# Patient Record
Sex: Female | Born: 1950
Health system: Southern US, Community
[De-identification: ages and names within clinical notes are randomized; demographics above are authoritative.]

## PROBLEM LIST (undated history)

## (undated) DIAGNOSIS — E669 Obesity, unspecified: Secondary | ICD-10-CM

## (undated) DIAGNOSIS — E785 Hyperlipidemia, unspecified: Secondary | ICD-10-CM

## (undated) DIAGNOSIS — F439 Reaction to severe stress, unspecified: Secondary | ICD-10-CM

## (undated) DIAGNOSIS — E119 Type 2 diabetes mellitus without complications: Secondary | ICD-10-CM

## (undated) DIAGNOSIS — D369 Benign neoplasm, unspecified site: Secondary | ICD-10-CM

## (undated) DIAGNOSIS — I639 Cerebral infarction, unspecified: Secondary | ICD-10-CM

## (undated) DIAGNOSIS — M1990 Unspecified osteoarthritis, unspecified site: Secondary | ICD-10-CM

## (undated) DIAGNOSIS — F32A Depression, unspecified: Secondary | ICD-10-CM

## (undated) DIAGNOSIS — F329 Major depressive disorder, single episode, unspecified: Secondary | ICD-10-CM

## (undated) DIAGNOSIS — L680 Hirsutism: Secondary | ICD-10-CM

## (undated) DIAGNOSIS — I10 Essential (primary) hypertension: Secondary | ICD-10-CM

## (undated) DIAGNOSIS — N189 Chronic kidney disease, unspecified: Secondary | ICD-10-CM

## (undated) DIAGNOSIS — R011 Cardiac murmur, unspecified: Secondary | ICD-10-CM

## (undated) HISTORY — DX: Cardiac murmur, unspecified: R01.1

## (undated) HISTORY — DX: Hirsutism: L68.0

## (undated) HISTORY — DX: Unspecified osteoarthritis, unspecified site: M19.90

## (undated) HISTORY — DX: Obesity, unspecified: E66.9

## (undated) HISTORY — DX: Major depressive disorder, single episode, unspecified: F32.9

## (undated) HISTORY — DX: Chronic kidney disease, unspecified: N18.9

## (undated) HISTORY — DX: Benign neoplasm, unspecified site: D36.9

## (undated) HISTORY — DX: Hyperlipidemia, unspecified: E78.5

## (undated) HISTORY — DX: Reaction to severe stress, unspecified: F43.9

## (undated) HISTORY — DX: Type 2 diabetes mellitus without complications: E11.9

## (undated) HISTORY — PX: DILATION AND CURETTAGE OF UTERUS: SHX78

## (undated) HISTORY — DX: Depression, unspecified: F32.A

## (undated) HISTORY — DX: Cerebral infarction, unspecified: I63.9

## (undated) HISTORY — DX: Essential (primary) hypertension: I10

## (undated) HISTORY — PX: COLONOSCOPY: SHX174

---
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
## 1999-09-20 ENCOUNTER — Encounter: Payer: Self-pay | Admitting: Internal Medicine

## 1999-09-20 ENCOUNTER — Ambulatory Visit (HOSPITAL_COMMUNITY): Admission: RE | Admit: 1999-09-20 | Discharge: 1999-09-20 | Payer: Self-pay | Admitting: Internal Medicine

## 1999-09-26 ENCOUNTER — Other Ambulatory Visit: Admission: RE | Admit: 1999-09-26 | Discharge: 1999-09-26 | Payer: Self-pay | Admitting: Internal Medicine

## 1999-10-03 ENCOUNTER — Encounter (INDEPENDENT_AMBULATORY_CARE_PROVIDER_SITE_OTHER): Payer: Self-pay

## 1999-10-03 ENCOUNTER — Ambulatory Visit (HOSPITAL_COMMUNITY): Admission: RE | Admit: 1999-10-03 | Discharge: 1999-10-03 | Payer: Self-pay | Admitting: Obstetrics & Gynecology

## 2000-03-21 ENCOUNTER — Other Ambulatory Visit: Admission: RE | Admit: 2000-03-21 | Discharge: 2000-03-21 | Payer: Self-pay | Admitting: Obstetrics & Gynecology

## 2000-10-31 ENCOUNTER — Ambulatory Visit (HOSPITAL_COMMUNITY): Admission: RE | Admit: 2000-10-31 | Discharge: 2000-10-31 | Payer: Self-pay | Admitting: Obstetrics & Gynecology

## 2000-10-31 ENCOUNTER — Encounter (INDEPENDENT_AMBULATORY_CARE_PROVIDER_SITE_OTHER): Payer: Self-pay

## 2001-04-27 ENCOUNTER — Other Ambulatory Visit: Admission: RE | Admit: 2001-04-27 | Discharge: 2001-04-27 | Payer: Self-pay | Admitting: Obstetrics & Gynecology

## 2002-10-19 ENCOUNTER — Other Ambulatory Visit: Admission: RE | Admit: 2002-10-19 | Discharge: 2002-10-19 | Payer: Self-pay | Admitting: Obstetrics & Gynecology

## 2003-10-13 ENCOUNTER — Other Ambulatory Visit: Admission: RE | Admit: 2003-10-13 | Discharge: 2003-10-13 | Payer: Self-pay | Admitting: Family Medicine

## 2005-12-02 ENCOUNTER — Emergency Department (HOSPITAL_COMMUNITY): Admission: EM | Admit: 2005-12-02 | Discharge: 2005-12-02 | Payer: Self-pay | Admitting: Family Medicine

## 2006-06-06 ENCOUNTER — Other Ambulatory Visit: Admission: RE | Admit: 2006-06-06 | Discharge: 2006-06-06 | Payer: Self-pay | Admitting: Family Medicine

## 2006-07-11 ENCOUNTER — Encounter (INDEPENDENT_AMBULATORY_CARE_PROVIDER_SITE_OTHER): Payer: Self-pay | Admitting: Family Medicine

## 2006-07-11 ENCOUNTER — Ambulatory Visit: Payer: Self-pay | Admitting: Vascular Surgery

## 2006-07-11 ENCOUNTER — Ambulatory Visit (HOSPITAL_COMMUNITY): Admission: RE | Admit: 2006-07-11 | Discharge: 2006-07-11 | Payer: Self-pay | Admitting: Family Medicine

## 2006-10-10 ENCOUNTER — Ambulatory Visit (HOSPITAL_COMMUNITY): Admission: RE | Admit: 2006-10-10 | Discharge: 2006-10-10 | Payer: Self-pay | Admitting: Obstetrics & Gynecology

## 2006-10-10 ENCOUNTER — Encounter (INDEPENDENT_AMBULATORY_CARE_PROVIDER_SITE_OTHER): Payer: Self-pay | Admitting: Obstetrics & Gynecology

## 2009-03-02 ENCOUNTER — Encounter: Admission: RE | Admit: 2009-03-02 | Discharge: 2009-03-02 | Payer: Self-pay | Admitting: Family Medicine

## 2010-03-15 ENCOUNTER — Emergency Department (HOSPITAL_COMMUNITY)
Admission: EM | Admit: 2010-03-15 | Discharge: 2010-03-15 | Payer: Self-pay | Source: Home / Self Care | Admitting: Emergency Medicine

## 2010-07-03 NOTE — Op Note (Signed)
Shannon Carroll, Shannon Carroll                ACCOUNT NO.:  [id number]   MEDICAL RECORD NO.:  [id number]          PATIENT TYPE:  AMB   LOCATION:  SDC                           FACILITY:  [facility]   PHYSICIAN:  Freddy Finner, M.D.   DATE OF BIRTH:  January 10, 1951   DATE OF PROCEDURE:  10/10/2006  DATE OF DISCHARGE:                               OPERATIVE REPORT   PREOPERATIVE DIAGNOSES:  1. Benign endocervical microglandular hyperplasia.  2. Postmenopausal bleeding.  3. Histologic diagnosis of endometrial biopsy.  4. Massive obesity.  5. Risk of endometrial hyperplasia is yet unconfirmed.   POSTOPERATIVE DIAGNOSES:  1. Benign endocervical microglandular hyperplasia.  2. Postmenopausal bleeding.  3. Histologic diagnosis of endometrial biopsy.  4. Massive obesity.  5. Risk of endometrial hyperplasia is yet unconfirmed.   OPERATIVE PROCEDURE:  Hysteroscopy D&C.   ANESTHESIA:  General and paracervical block using 10 mL of 1% Xylocaine.   INTRAOPERATIVE COMPLICATIONS:  None.   ESTIMATED BLOOD LOSS:  10 mL.   SORBITOL DEFICIT:  30 mL.   The patient is a pleasant, massively obese, white female who had an  episode of postmenopausal bleeding and was seen in the office by my  nurse practitioner, Julio Sicks, with a finding of a thickened  myometrium and 10 mm endometrial lining. An endometrial biopsy was  obtained with findings as noted above. Due to her increased risk of  hyperplasia and/or malignancy she is admitted now for more definitive  surgical intervention.   She was admitted on the morning of surgery, she was given a gram of  Ancef IV preoperatively. She was brought to the operating room, placed  under general anesthesia, placed in the dorsal lithotomy position using  the [facility] stirrup system. Betadine prep of the mons, perineum and  vagina was carried out, sterile drapes were applied. A bivalve speculum  was introduced and cervix visualized using the speculum. The cervix was  grasped on  the anterior lip with a single tooth tenaculum, paracervical  block was placed at 4 and 8 o'clock in the vaginal fornices at a depth  of approximately 5 mm with approximately 5 mL of 1% Xylocaine at each  location. The uterus sounded to 10 cm, the cervix was progressively  dilated to 25 with Pratt's. The 12-degree hysteroscope was introduced  using 3% sorbitol. Photographs were made before and after curettage,  there were no obvious defects, specifically no evidence of intracavitary  mass. Adequate sampling was confirmed by repeated inspection after  gentle thorough curettage and  exploration with Randall stone forceps. The patient was awakened and  taken to recovery in good condition. She will be given routine  outpatient surgical instructions. She was given Darvocet to be taken as  needed for postoperative pain. She is to followup in the office in 7-10  days.      Freddy Finner, M.D.  Electronically Signed     WRN/MEDQ  D:  10/10/2006  T:  10/11/2006  Job:  [id number]

## 2010-07-06 NOTE — Op Note (Signed)
[facility] of [facility]  Patient:    Shannon Carroll, Shannon Carroll                       MRN: [id number] Proc. Date: 10/03/99 Adm. Date:  [id number] Attending:  Minette Headland                           Operative Report  PREOPERATIVE DIAGNOSES:       1. Uterine enlargement.                               2. Dysfunctional uterine bleeding.                               3. Massive obesity.  POSTOPERATIVE DIAGNOSES:      1. Uterine enlargement.                               2. Dysfunctional uterine bleeding.                               3. Massive obesity.  OPERATION:                    Hysteroscopy and D&C.  SURGEON:                      Freddy Finner, M.D.  ANESTHESIA:                   Mask, intravenous sedation, and paracervical                               block.  INTRAOPERATIVE COMPLICATIONS: None.  ESTIMATED BLOOD LOSS:         Less than or equal to 20 cc.  INTRAOPERATIVE SORBITOL DEFICIT:                      80 cc.  INDICATIONS:                  The patient is a 60 year old massively obese white female who has had dysfunctional uterine bleeding.  A pelvic ultrasound was obtained in the office which showed enlargement of the uterus to 10.3 x 6.8 x 7.5 cm.  The study was suboptimal because of obesity.  There was one, approximately 2.5 cm lesion in the anterior uterus which was thought to be consistent with a fibroid or adenomyoma, and this further precluded adequate evaluation of the endometrium.                                Given the significant increased risk of endometrial atypia or malignancy, it is elected to proceed with hysteroscopy and D&C.  DESCRIPTION OF PROCEDURE:     On sounding, the uterus sounded only to 9 cm. The endometrial cavity did not appear to have any significant visible abnormalities, although the exam was somewhat compromised by the patients size, and by some physical movement of the patient during the operative procedure.  The patient was admitted on the morning of surgery and brought to the operating room, placed under adequate intravenous sedation, and placed in the dorsolithotomy position using the [facility] stirrup system.  Betadine prep was carried out in the usual fashion.  The cervix was visualized and grasped with a single tooth tenaculum.  A paracervical block was placed by injecting 5 cc of 1% plain Xylocaine at each of the two locations in the vaginal fornices at 4 oclock and 8 oclock.  The cervix was progressively dilated to 23.  The uterus sounded to 9 cm to 9.5 cm.  ACMI hysteroscope was introduced using sorbitol distending medium, visualization was somewhat compromised because of the patients movements during the procedure, but was not felt that there was any obvious abnormality noted in the endometrium or endocervix.  Thorough curettage and exploration was made of __________ was performed.  The procedure was then terminated.                                The instruments were removed and specimens were submitted for histologic examination.  The patient was taken to the recovery room in good condition and will be discharged in the immediate postoperative period for follow up in the office in approximately 1-2 weeks.  She is to take ibuprofen or Tylenol as needed for pain. DD:  10/03/99 TD:  10/03/99 Job: [id number]

## 2010-07-06 NOTE — Op Note (Signed)
[facility] of [facility]  Patient:    Shannon Carroll, Shannon Carroll Visit Number: [id number] MRN: [id number]          Service Type: DSU Location: [facility] Attending Physician:  Minette Headland Dictated by:   Freddy Finner, M.D. Admit Date:  10/31/2000                             Operative Report  PREOPERATIVE DIAGNOSES:       Postmenopausal bleeding, minimal endometrial thickening to 6.9 mm, uterine leiomyomata, massive obesity.  POSTOPERATIVE DIAGNOSES:      Postmenopausal bleeding, minimal endometrial thickening to 6.9 mm, uterine leiomyomata, massive obesity.  PROCEDURE:                    Dilatation and curettage with attempted hysteroscopy which was unsuccessful because of the patients body habitus and light anesthesia precluding adequate hysteroscopic examination of the uterine cavity.  SURGEON:                      Freddy Finner, M.D.  ANESTHESIA:                   Managed intravenous sedation and paracervical block.  HISTORY:                      Patient is admitted for hysteroscopy, D&C because of dysfunctional uterine bleeding.  This has been a recurrent problem. The patient has had some irregular bleeding in spite of Prometrium 200 mg q.h.s. at bedtime.  Pelvic ultrasound in the office did show 6.9 mm of endometrium without definite intracavitary abnormalities.  PROCEDURE:                    Patient was admitted, brought to the operating room, placed under adequate intravenous sedation, placed in the dorsal lithotomy position.  Betadine prep of perineum and vagina was carried out. Sterile drapes were applied.  Vivelle speculum was introduced.  Due to guarding and muscle tension the speculum would not remain in the vagina and for that reason the banana elongated posterior vaginal retractor was placed. Deaver was placed.  Cervix was grasped with a single tooth tenaculum on the anterior lip and paracervical block by injection 1% plain Xylocaine at 4 and  8 oclock at the vaginal fornices.  The cervix was dilated to 23 with Pratts. An attempt to insert the hysteroscope was unsuccessful because of the patient moving on the table and tightening the abdominal and pelvic musculature.  For this reason sharp curette was introduced and gentle, thorough curettage was carried out.  Exploration with ______ forceps was carried.  Sample submitted for histologic examination.  The procedure was terminated at this point.  The patient was taken to recovery in good condition and will be followed in the office in approximately one week.Dictated by:   Freddy Finner, M.D.  Attending Physician:  Minette Headland DD:  10/31/00 TD:  10/31/00 Job: [id number]

## 2010-11-09 ENCOUNTER — Other Ambulatory Visit: Payer: Self-pay | Admitting: Obstetrics & Gynecology

## 2010-11-30 LAB — CBC
HCT: 40.7
Hemoglobin: 14.4
MCHC: 35.4
MCV: 88.9
Platelets: 211
RBC: 4.57
RDW: 12.4
WBC: 9.5

## 2010-11-30 LAB — BASIC METABOLIC PANEL
BUN: 16
Creatinine, Ser: 0.72
GFR calc Af Amer: >60
GFR calc non Af Amer: >60

## 2012-12-28 ENCOUNTER — Other Ambulatory Visit: Payer: Self-pay | Admitting: Obstetrics & Gynecology

## 2012-12-28 DIAGNOSIS — R928 Other abnormal and inconclusive findings on diagnostic imaging of breast: Secondary | ICD-10-CM

## 2013-01-11 ENCOUNTER — Other Ambulatory Visit: Payer: Self-pay | Admitting: Gastroenterology

## 2013-01-18 DIAGNOSIS — D369 Benign neoplasm, unspecified site: Secondary | ICD-10-CM

## 2013-01-18 HISTORY — DX: Benign neoplasm, unspecified site: D36.9

## 2013-07-13 ENCOUNTER — Encounter: Payer: 59 | Attending: Family Medicine | Admitting: *Deleted

## 2013-07-13 ENCOUNTER — Encounter: Payer: Self-pay | Admitting: *Deleted

## 2013-07-13 VITALS — Ht 66.5 in | Wt 342.0 lb

## 2013-07-13 DIAGNOSIS — E669 Obesity, unspecified: Secondary | ICD-10-CM | POA: Insufficient documentation

## 2013-07-13 DIAGNOSIS — Z6841 Body Mass Index (BMI) 40.0 and over, adult: Secondary | ICD-10-CM | POA: Insufficient documentation

## 2013-07-13 DIAGNOSIS — E119 Type 2 diabetes mellitus without complications: Secondary | ICD-10-CM | POA: Insufficient documentation

## 2013-07-13 DIAGNOSIS — Z713 Dietary counseling and surveillance: Secondary | ICD-10-CM | POA: Insufficient documentation

## 2013-07-13 NOTE — Patient Instructions (Signed)
Plan:  Aim for 3 Carb Choices per meal (45 grams) +/- 1 either way  Aim for 0-1 Carbs per snack if hungry  Include protein in moderation with your meals and snacks Consider reading food labels for Total Carbohydrate of foods Consider  increasing your activity level by swimming or Arm Chair Exercises daily as tolerated

## 2013-07-13 NOTE — Progress Notes (Signed)
  Medical Nutrition Therapy:  Appt start time: 1400 end time:  1500.  Assessment:  Primary concerns today: obesity with diabetes. She lives with husband, she cooks and prepares their meals. Husband is blind due to diabetes and also has renal disease with some toes that have been amputated, so she is his caregiver. 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 She enjoys going to the movies, have friends over and visiting friends, she is active at church. She does not have a meter that works right now. She states her last A1c was today, stating 5.6%! She is not typically been very active, recently off feet due to cellulitis.  She has decided to take her health seriously and has already lost 14 pounds.  Preferred Learning Style:   No preference indicated   Learning Readiness:   Ready  Change in progress  MEDICATIONS: see list, diabetes medication is Metformin   DIETARY INTAKE:  24-hr recall:  B (5:30 AM): 1/2 cup cottage cheese and a banana OR boiled egg with cereal and blueberries, skim milk, coffee black  Snk ( AM): no  L (11:30 PM): sandwich with chicken salad with added celery, low sodium ham, occasionally fresh fruit, water Snk ( PM): no D ( PM): lean meat, salad with raspberry vinaigrette dressing, small serving of starch, cooked vegetables, water Snk ( PM): not anymore Beverages: coffee, water, 1 diet coke  Usual physical activity: has been limited lately due to cellulitis. Would enjoy pool exercises, likes being outside  Estimated energy needs: 1400 calories 158 g carbohydrates 105 g protein 39 g fat  Progress Towards Goal(s):  In progress.   Nutritional Diagnosis:  NI-1.5 Excessive energy intake As related to activity level.  As evidenced by BMI of 54.5.    Intervention:  Nutrition counseling and diabetes education initiated. Discussed Carb Counting as method of portion control, reading food labels, and benefits of increased activity. She is interested in  pool exercises as well as trying Arm Chair Exercises which she states her husband has done successfully in the past.  Plan:  Aim for 3 Carb Choices per meal (45 grams) +/- 1 either way  Aim for 0-1 Carbs per snack if hungry  Include protein in moderation with your meals and snacks Consider reading food labels for Total Carbohydrate of foods Consider  increasing your activity level by swimming or Arm Chair Exercises daily as tolerated  Teaching Method Utilized: Visual, Auditory and Hands on  Handouts given during visit include: Carb Counting and Food Label handouts Meal Plan Card  Arm Chair Exercise handout  Barriers to learning/adherence to lifestyle change: caregiver for her husband, history of many diets over the years.  Demonstrated degree of understanding via:  Teach Back   Monitoring/Evaluation:  Dietary intake, exercise, reading food labels, and body weight in 1 month(s).

## 2013-08-18 ENCOUNTER — Ambulatory Visit: Payer: Self-pay | Admitting: *Deleted

## 2013-08-24 ENCOUNTER — Encounter: Payer: Self-pay | Admitting: *Deleted

## 2013-08-24 ENCOUNTER — Encounter: Payer: 59 | Attending: Family Medicine | Admitting: *Deleted

## 2013-08-24 DIAGNOSIS — Z713 Dietary counseling and surveillance: Secondary | ICD-10-CM | POA: Insufficient documentation

## 2013-08-24 DIAGNOSIS — Z6841 Body Mass Index (BMI) 40.0 and over, adult: Secondary | ICD-10-CM | POA: Insufficient documentation

## 2013-08-24 DIAGNOSIS — E669 Obesity, unspecified: Secondary | ICD-10-CM | POA: Diagnosis not present

## 2013-08-24 DIAGNOSIS — E119 Type 2 diabetes mellitus without complications: Secondary | ICD-10-CM | POA: Insufficient documentation

## 2013-08-24 NOTE — Patient Instructions (Addendum)
Plan:  Continue to aim for 3 Carb Choices per meal (45 grams) +/- 1 either way  Continue to aim for 0-1 Carbs per snack if hungry  Include protein in moderation with your meals and snacks Continue reading food labels for Total Carbohydrate of foods Continue with your activity level by walking and Arm Chair Exercises daily as tolerated Be aware that every 5 grams of Fat is 1 serving, you can aim for the same servings as your Carbs (3 +/- 1 either way)

## 2013-08-24 NOTE — Progress Notes (Signed)
  Medical Nutrition Therapy:  Appt start time: 1400 end time:  1430.  Assessment:  Primary concerns today: obesity with diabetes follow up. Very happy with 15 pound weight loss in past 6 weeks. Happy with carb counting method for portion control. She has increased activity by walking 15-20 minutes a day now and states the Arm Chair exercises have improved her mobility. She is practicing making decisions that benefit her vs taking care of others first. Voices pleasure in the positive changes she is making in her life.  Preferred Learning Style:   No preference indicated   Learning Readiness:   Ready  Change in progress  MEDICATIONS: see list, diabetes medication is Metformin   DIETARY INTAKE:  24-hr recall:  B (5:30 AM): 1/2 cup cottage cheese and a banana OR boiled egg with cereal and blueberries, skim milk, coffee black  Snk ( AM): no  L (11:30 PM): sandwich with chicken salad with added celery, low sodium ham, occasionally fresh fruit, water Snk ( PM): no D ( PM): lean meat, salad with raspberry vinaigrette dressing, small serving of starch, cooked vegetables, water Snk ( PM): not anymore Beverages: coffee, water, 1 diet coke  Usual physical activity: has been limited lately due to cellulitis. Would enjoy pool exercises, likes being outside  Estimated energy needs: 1400 calories 158 g carbohydrates 105 g protein 39 g fat  Progress Towards Goal(s):  In progress.   Nutritional Diagnosis:  NI-1.5 Excessive energy intake As related to activity level.  As evidenced by BMI of 54.5, now down to 52.2    Intervention:  Nutrition counseling and diabetes education continued. Commended her on her several positive behavior changes including making carb choices within 3 choice range, increasing her activity level daily, taking care of herself first and then caring for others, and giving herself permission to be proud of herself. Answered questions regarding fat servings and suggested she  aim for 3 servings per meal, the same servings as carb choices.  Plan:  Aim for 3 Carb Choices per meal (45 grams) +/- 1 either way  Aim for 0-1 Carbs per snack if hungry  Include protein in moderation with your meals and snacks Consider reading food labels for Total Carbohydrate of foods Consider  increasing your activity level by swimming or Arm Chair Exercises daily as tolerated Be aware that every 5 grams of Fat is 1 serving, you can aim for the same servings as your Carbs (3 +/- 1 either way)  Teaching Method Utilized: Visual, Auditory   Handouts given during visit include: Carb Counting Sheet for Fat Gram information today  Barriers to learning/adherence to lifestyle change: caregiver for her husband, history of many diets over the years.  Demonstrated degree of understanding via:  Teach Back   Monitoring/Evaluation:  Dietary intake, exercise, reading food labels, and body weight in 6 weeks.

## 2013-10-07 ENCOUNTER — Ambulatory Visit: Payer: 59 | Admitting: *Deleted

## 2013-11-18 ENCOUNTER — Ambulatory Visit: Payer: 59 | Admitting: *Deleted

## 2014-01-25 ENCOUNTER — Other Ambulatory Visit: Payer: Self-pay | Admitting: Obstetrics & Gynecology

## 2014-01-26 LAB — CYTOLOGY - PAP

## 2015-05-10 ENCOUNTER — Other Ambulatory Visit: Payer: Self-pay | Admitting: Family Medicine

## 2015-05-10 DIAGNOSIS — R0989 Other specified symptoms and signs involving the circulatory and respiratory systems: Secondary | ICD-10-CM

## 2015-05-15 ENCOUNTER — Ambulatory Visit
Admission: RE | Admit: 2015-05-15 | Discharge: 2015-05-15 | Disposition: A | Discharge status: Home / Self Care | Payer: 59 | Source: Ambulatory Visit | Attending: Family Medicine | Admitting: Family Medicine

## 2015-05-15 DIAGNOSIS — R0989 Other specified symptoms and signs involving the circulatory and respiratory systems: Secondary | ICD-10-CM

## 2015-11-23 ENCOUNTER — Ambulatory Visit (INDEPENDENT_AMBULATORY_CARE_PROVIDER_SITE_OTHER): Payer: 59 | Admitting: Podiatry

## 2015-11-23 ENCOUNTER — Encounter: Payer: Self-pay | Admitting: Podiatry

## 2015-11-23 VITALS — BP 102/70 | HR 74 | Resp 16 | Ht 64.0 in | Wt 300.0 lb

## 2015-11-23 DIAGNOSIS — M79676 Pain in unspecified toe(s): Secondary | ICD-10-CM | POA: Diagnosis not present

## 2015-11-23 DIAGNOSIS — B351 Tinea unguium: Secondary | ICD-10-CM | POA: Diagnosis not present

## 2015-11-23 DIAGNOSIS — E1149 Type 2 diabetes mellitus with other diabetic neurological complication: Secondary | ICD-10-CM

## 2015-11-23 NOTE — Progress Notes (Signed)
   Subjective:    Patient ID: Shannon Carroll, female    DOB: [birth year], 65 y.o.   MRN: [id number]  HPI this patient presents to the office with chief complaint of painful ingrowing toenails on both feet. She states the nails are painful walking and wearing her shoes. He says she had been getting her nails done at the salon, but developed a cellulitis in both legs. She says she took antibiotics and the infection resolved. His patient does admit that she is diabetic and does not take good care of herself. She presents the office today for continued evaluation and treatment of her painful feet    Review of Systems  All other systems reviewed and are negative.      Objective:   Physical Exam GENERAL APPEARANCE: Alert, conversant. Appropriately groomed. No acute distress.  VASCULAR: Pedal pulses are  palpable at  DP  bilateral. PT pulses are absent B/L Capillary refill time is immediate to all digits,  Normal temperature gradient.  NEUROLOGIC: sensation is diminished  to 5.07 monofilament at 5/5 sites bilateral.  Light touch is intact bilateral, Muscle strength normal.  MUSCULOSKELETAL: acceptable muscle strength, tone and stability bilateral.  Intrinsic muscluature intact bilateral.  HAV 1st MPJ  B/L.  DJD IPJ right hallux  DERMATOLOGIC: skin color, texture, and turgor are within normal limits.  No preulcerative lesions or ulcers  are seen, no interdigital maceration noted.  No open lesions present.  . No drainage noted. NAILS  Thick disfigured discolored nails with marker incurvation multiple nails.       Assessment & Plan:  Onychomycosis  Pincer Nails  Diabetes with neuropathy  IE  Debride Nails  RTC 3 months   Gardiner Barefoot DPM

## 2016-02-15 ENCOUNTER — Ambulatory Visit (INDEPENDENT_AMBULATORY_CARE_PROVIDER_SITE_OTHER): Payer: 59 | Admitting: Podiatry

## 2016-02-15 ENCOUNTER — Encounter: Payer: Self-pay | Admitting: Podiatry

## 2016-02-15 VITALS — Ht 64.0 in | Wt 300.0 lb

## 2016-02-15 DIAGNOSIS — B351 Tinea unguium: Secondary | ICD-10-CM

## 2016-02-15 DIAGNOSIS — M79676 Pain in unspecified toe(s): Secondary | ICD-10-CM

## 2016-02-15 DIAGNOSIS — E1149 Type 2 diabetes mellitus with other diabetic neurological complication: Secondary | ICD-10-CM | POA: Diagnosis not present

## 2016-02-15 NOTE — Progress Notes (Signed)
   Subjective:    Patient ID: Shannon Carroll, female    DOB: [birth year], 65 y.o.   MRN: [id number]  HPI this patient presents to the office with chief complaint of painful ingrowing toenails on both feet. She states the nails are painful walking and wearing her shoes. He says she had been getting her nails done at the salon, but developed a cellulitis in both legs.  This patient does admit that she is diabetic and does not take good care of herself. She presents the office today for continued evaluation and treatment of her painful feet    Review of Systems  All other systems reviewed and are negative.      Objective:   Physical Exam GENERAL APPEARANCE: Alert, conversant. Appropriately groomed. No acute distress.  VASCULAR: Pedal pulses are  palpable at  DP  bilateral. PT pulses are absent B/L Capillary refill time is immediate to all digits,  Normal temperature gradient.  NEUROLOGIC: sensation is diminished  to 5.07 monofilament at 5/5 sites bilateral.  Light touch is intact bilateral, Muscle strength normal.  MUSCULOSKELETAL: acceptable muscle strength, tone and stability bilateral.  Intrinsic muscluature intact bilateral.  HAV 1st MPJ  B/L.  DJD IPJ right hallux  DERMATOLOGIC: skin color, texture, and turgor are within normal limits.  No preulcerative lesions or ulcers  are seen, no interdigital maceration noted.  No open lesions present.  . No drainage noted. NAILS  Thick disfigured discolored nails with marker incurvation multiple nails.       Assessment & Plan:  Onychomycosis  Pincer Nails  Diabetes with neuropathy  IE  Debride Nails  RTC 3 months   Gardiner Barefoot DPM

## 2016-03-26 DIAGNOSIS — E1140 Type 2 diabetes mellitus with diabetic neuropathy, unspecified: Secondary | ICD-10-CM | POA: Diagnosis not present

## 2016-03-26 DIAGNOSIS — I10 Essential (primary) hypertension: Secondary | ICD-10-CM | POA: Diagnosis not present

## 2016-03-26 DIAGNOSIS — F3341 Major depressive disorder, recurrent, in partial remission: Secondary | ICD-10-CM | POA: Diagnosis not present

## 2016-03-26 DIAGNOSIS — F439 Reaction to severe stress, unspecified: Secondary | ICD-10-CM | POA: Diagnosis not present

## 2016-03-26 DIAGNOSIS — E785 Hyperlipidemia, unspecified: Secondary | ICD-10-CM | POA: Diagnosis not present

## 2016-04-02 DIAGNOSIS — R509 Fever, unspecified: Secondary | ICD-10-CM | POA: Diagnosis not present

## 2016-04-02 DIAGNOSIS — J101 Influenza due to other identified influenza virus with other respiratory manifestations: Secondary | ICD-10-CM | POA: Diagnosis not present

## 2016-05-08 ENCOUNTER — Ambulatory Visit: Payer: 59 | Admitting: Podiatry

## 2016-05-23 DIAGNOSIS — Z961 Presence of intraocular lens: Secondary | ICD-10-CM | POA: Diagnosis not present

## 2016-05-23 DIAGNOSIS — H01131 Eczematous dermatitis of right upper eyelid: Secondary | ICD-10-CM | POA: Diagnosis not present

## 2016-05-23 DIAGNOSIS — H35371 Puckering of macula, right eye: Secondary | ICD-10-CM | POA: Diagnosis not present

## 2016-05-23 DIAGNOSIS — E119 Type 2 diabetes mellitus without complications: Secondary | ICD-10-CM | POA: Diagnosis not present

## 2016-05-27 ENCOUNTER — Other Ambulatory Visit: Payer: Self-pay | Admitting: Family Medicine

## 2016-05-27 DIAGNOSIS — I6523 Occlusion and stenosis of bilateral carotid arteries: Secondary | ICD-10-CM

## 2016-06-03 ENCOUNTER — Other Ambulatory Visit: Payer: 59

## 2016-06-11 ENCOUNTER — Ambulatory Visit
Admission: RE | Admit: 2016-06-11 | Discharge: 2016-06-11 | Disposition: A | Discharge status: Home / Self Care | Payer: Medicare Other | Source: Ambulatory Visit | Attending: Family Medicine | Admitting: Family Medicine

## 2016-06-11 DIAGNOSIS — I6523 Occlusion and stenosis of bilateral carotid arteries: Secondary | ICD-10-CM

## 2016-06-28 ENCOUNTER — Ambulatory Visit (INDEPENDENT_AMBULATORY_CARE_PROVIDER_SITE_OTHER): Payer: Medicare Other | Admitting: Podiatry

## 2016-06-28 ENCOUNTER — Encounter: Payer: Self-pay | Admitting: Podiatry

## 2016-06-28 DIAGNOSIS — B351 Tinea unguium: Secondary | ICD-10-CM

## 2016-06-28 DIAGNOSIS — M79676 Pain in unspecified toe(s): Secondary | ICD-10-CM

## 2016-06-28 DIAGNOSIS — E1149 Type 2 diabetes mellitus with other diabetic neurological complication: Secondary | ICD-10-CM

## 2016-06-28 NOTE — Progress Notes (Signed)
   Subjective:    Patient ID: Shannon Carroll, female    DOB: 23-May-1950, 66 y.o.   MRN: [id number]  HPI this patient presents to the office with chief complaint of painful ingrowing toenails on both feet. She states the nails are painful walking and wearing her shoes. This patient does admit that she is diabetic and does not take good care of herself. She presents the office today for continued evaluation and treatment of her painful feet    Review of Systems  All other systems reviewed and are negative.      Objective:   Physical Exam GENERAL APPEARANCE: Alert, conversant. Appropriately groomed. No acute distress.  VASCULAR: Pedal pulses are  palpable at  DP  bilateral. PT pulses are absent B/L Capillary refill time is immediate to all digits,  Normal temperature gradient.  NEUROLOGIC: sensation is diminished  to 5.07 monofilament at 5/5 sites bilateral.  Light touch is intact bilateral, Muscle strength normal.  MUSCULOSKELETAL: acceptable muscle strength, tone and stability bilateral.  Intrinsic muscluature intact bilateral.  HAV 1st MPJ  B/L.  DJD IPJ right hallux  DERMATOLOGIC: skin color, texture, and turgor are within normal limits.  No preulcerative lesions or ulcers  are seen, no interdigital maceration noted.  No open lesions present.  . No drainage noted. NAILS  Thick disfigured discolored nails with marker incurvation multiple nails.       Assessment & Plan:  Onychomycosis  Pincer Nails  Diabetes with neuropathy  IE  Debride Nails  RTC 3 months   Gardiner Barefoot DPM

## 2016-07-26 DIAGNOSIS — E1140 Type 2 diabetes mellitus with diabetic neuropathy, unspecified: Secondary | ICD-10-CM | POA: Diagnosis not present

## 2016-07-26 DIAGNOSIS — F324 Major depressive disorder, single episode, in partial remission: Secondary | ICD-10-CM | POA: Diagnosis not present

## 2016-07-26 DIAGNOSIS — I10 Essential (primary) hypertension: Secondary | ICD-10-CM | POA: Diagnosis not present

## 2016-07-26 DIAGNOSIS — E785 Hyperlipidemia, unspecified: Secondary | ICD-10-CM | POA: Diagnosis not present

## 2016-07-26 DIAGNOSIS — Z7984 Long term (current) use of oral hypoglycemic drugs: Secondary | ICD-10-CM | POA: Diagnosis not present

## 2016-09-27 ENCOUNTER — Encounter: Payer: Self-pay | Admitting: Podiatry

## 2016-09-27 ENCOUNTER — Ambulatory Visit (INDEPENDENT_AMBULATORY_CARE_PROVIDER_SITE_OTHER): Payer: Medicare Other | Admitting: Podiatry

## 2016-09-27 DIAGNOSIS — E1149 Type 2 diabetes mellitus with other diabetic neurological complication: Secondary | ICD-10-CM

## 2016-09-27 DIAGNOSIS — B351 Tinea unguium: Secondary | ICD-10-CM | POA: Diagnosis not present

## 2016-09-27 DIAGNOSIS — M79676 Pain in unspecified toe(s): Secondary | ICD-10-CM

## 2016-09-27 NOTE — Progress Notes (Signed)
   Subjective:    Patient ID: Shannon Carroll, female    DOB: October 29, 1950, 66 y.o.   MRN: [id number]  HPI this patient presents to the office with chief complaint of painful ingrowing toenails on both feet. She states the nails are painful walking and wearing her shoes. This patient does admit that she is diabetic and does not take good care of herself. She presents the office today for continued evaluation and treatment of her painful feet    Review of Systems  All other systems reviewed and are negative.      Objective:   Physical Exam GENERAL APPEARANCE: Alert, conversant. Appropriately groomed. No acute distress.  VASCULAR: Pedal pulses are  palpable at  DP  bilateral. PT pulses are absent B/L Capillary refill time is immediate to all digits,  Normal temperature gradient.  NEUROLOGIC: sensation is diminished  to 5.07 monofilament at 5/5 sites bilateral.  Light touch is intact bilateral, Muscle strength normal.  MUSCULOSKELETAL: acceptable muscle strength, tone and stability bilateral.  Intrinsic muscluature intact bilateral.  HAV 1st MPJ  B/L.  DJD IPJ right hallux  DERMATOLOGIC: skin color, texture, and turgor are within normal limits.  No preulcerative lesions or ulcers  are seen, no interdigital maceration noted.  No open lesions present.  . No drainage noted. NAILS  Thick disfigured discolored nails with marker incurvation multiple nails.       Assessment & Plan:  Onychomycosis  Pincer Nails  Diabetes with neuropathy  IE  Debride Nails  RTC 3 months   Gardiner Barefoot DPM

## 2016-11-25 DIAGNOSIS — E1140 Type 2 diabetes mellitus with diabetic neuropathy, unspecified: Secondary | ICD-10-CM | POA: Diagnosis not present

## 2016-11-25 DIAGNOSIS — Z6841 Body Mass Index (BMI) 40.0 and over, adult: Secondary | ICD-10-CM | POA: Diagnosis not present

## 2016-11-25 DIAGNOSIS — Z23 Encounter for immunization: Secondary | ICD-10-CM | POA: Diagnosis not present

## 2016-11-25 DIAGNOSIS — I10 Essential (primary) hypertension: Secondary | ICD-10-CM | POA: Diagnosis not present

## 2016-11-25 DIAGNOSIS — E785 Hyperlipidemia, unspecified: Secondary | ICD-10-CM | POA: Diagnosis not present

## 2016-11-25 DIAGNOSIS — N898 Other specified noninflammatory disorders of vagina: Secondary | ICD-10-CM | POA: Diagnosis not present

## 2016-11-25 DIAGNOSIS — F3341 Major depressive disorder, recurrent, in partial remission: Secondary | ICD-10-CM | POA: Diagnosis not present

## 2016-12-20 ENCOUNTER — Ambulatory Visit (INDEPENDENT_AMBULATORY_CARE_PROVIDER_SITE_OTHER): Payer: Medicare Other | Admitting: Podiatry

## 2016-12-20 ENCOUNTER — Encounter: Payer: Self-pay | Admitting: Podiatry

## 2016-12-20 DIAGNOSIS — E1149 Type 2 diabetes mellitus with other diabetic neurological complication: Secondary | ICD-10-CM

## 2016-12-20 DIAGNOSIS — B351 Tinea unguium: Secondary | ICD-10-CM

## 2016-12-20 DIAGNOSIS — M79676 Pain in unspecified toe(s): Secondary | ICD-10-CM | POA: Diagnosis not present

## 2016-12-20 NOTE — Progress Notes (Signed)
   Subjective:    Patient ID: Shannon Carroll, female    DOB: 16-Oct-1950, 66 y.o.   MRN: [id number]  HPI this patient presents to the office with chief complaint of painful ingrowing toenails on both feet. She states the nails are painful walking and wearing her shoes.. She presents the office today for continued evaluation and treatment of her painful feet    Review of Systems  All other systems reviewed and are negative.      Objective:   Physical Exam GENERAL APPEARANCE: Alert, conversant. Appropriately groomed. No acute distress.  VASCULAR: Pedal pulses are  palpable at  DP  bilateral. PT pulses are absent B/L Capillary refill time is immediate to all digits,  Normal temperature gradient.  NEUROLOGIC: sensation is diminished  to 5.07 monofilament at 5/5 sites bilateral.  Light touch is intact bilateral, Muscle strength normal.  MUSCULOSKELETAL: acceptable muscle strength, tone and stability bilateral.  Intrinsic muscluature intact bilateral.  HAV 1st MPJ  B/L.  DJD IPJ right hallux  DERMATOLOGIC: skin color, texture, and turgor are within normal limits.  No preulcerative lesions or ulcers  are seen, no interdigital maceration noted.  No open lesions present.  . No drainage noted. NAILS  Thick disfigured discolored nails with marker incurvation multiple nails.       Assessment & Plan:  Onychomycosis  Pincer Nails  Diabetes with neuropathy  IE  Debride Nails  RTC 3 months   Gardiner Barefoot DPM

## 2017-01-15 DIAGNOSIS — Z6841 Body Mass Index (BMI) 40.0 and over, adult: Secondary | ICD-10-CM | POA: Diagnosis not present

## 2017-01-15 DIAGNOSIS — F439 Reaction to severe stress, unspecified: Secondary | ICD-10-CM | POA: Diagnosis not present

## 2017-01-15 DIAGNOSIS — R51 Headache: Secondary | ICD-10-CM | POA: Diagnosis not present

## 2017-01-24 DIAGNOSIS — R040 Epistaxis: Secondary | ICD-10-CM | POA: Diagnosis not present

## 2017-02-05 DIAGNOSIS — Z803 Family history of malignant neoplasm of breast: Secondary | ICD-10-CM | POA: Diagnosis not present

## 2017-02-05 DIAGNOSIS — Z1231 Encounter for screening mammogram for malignant neoplasm of breast: Secondary | ICD-10-CM | POA: Diagnosis not present

## 2017-03-26 DIAGNOSIS — I10 Essential (primary) hypertension: Secondary | ICD-10-CM | POA: Diagnosis not present

## 2017-03-26 DIAGNOSIS — Z23 Encounter for immunization: Secondary | ICD-10-CM | POA: Diagnosis not present

## 2017-03-26 DIAGNOSIS — Z6841 Body Mass Index (BMI) 40.0 and over, adult: Secondary | ICD-10-CM | POA: Diagnosis not present

## 2017-03-26 DIAGNOSIS — E785 Hyperlipidemia, unspecified: Secondary | ICD-10-CM | POA: Diagnosis not present

## 2017-03-26 DIAGNOSIS — E1140 Type 2 diabetes mellitus with diabetic neuropathy, unspecified: Secondary | ICD-10-CM | POA: Diagnosis not present

## 2017-03-26 DIAGNOSIS — F3341 Major depressive disorder, recurrent, in partial remission: Secondary | ICD-10-CM | POA: Diagnosis not present

## 2017-03-28 ENCOUNTER — Encounter: Payer: Self-pay | Admitting: Podiatry

## 2017-03-28 ENCOUNTER — Ambulatory Visit (INDEPENDENT_AMBULATORY_CARE_PROVIDER_SITE_OTHER): Payer: Medicare Other | Admitting: Podiatry

## 2017-03-28 DIAGNOSIS — B351 Tinea unguium: Secondary | ICD-10-CM | POA: Diagnosis not present

## 2017-03-28 DIAGNOSIS — M79676 Pain in unspecified toe(s): Secondary | ICD-10-CM

## 2017-03-28 DIAGNOSIS — E1149 Type 2 diabetes mellitus with other diabetic neurological complication: Secondary | ICD-10-CM

## 2017-03-28 NOTE — Progress Notes (Signed)
   Subjective:    Patient ID: Shannon Carroll, female    DOB: 08-Oct-1950, 68 y.o.   MRN: [id number]  HPI this patient presents to the office with chief complaint of painful ingrowing toenails on both feet. She states the nails are painful walking and wearing her shoes.. She presents the office today for continued evaluation and treatment of her painful feet    Review of Systems  All other systems reviewed and are negative.      Objective:   Physical Exam GENERAL APPEARANCE: Alert, conversant. Appropriately groomed. No acute distress.  VASCULAR: Pedal pulses are  palpable at  DP  bilateral. PT pulses are absent B/L Capillary refill time is immediate to all digits,  Normal temperature gradient.  NEUROLOGIC: sensation is diminished  to 5.07 monofilament at 5/5 sites bilateral.  Light touch is intact bilateral, Muscle strength normal.  MUSCULOSKELETAL: acceptable muscle strength, tone and stability bilateral.  Intrinsic muscluature intact bilateral.  HAV 1st MPJ  B/L.  DJD IPJ right hallux  DERMATOLOGIC: skin color, texture, and turgor are within normal limits.  No preulcerative lesions or ulcers  are seen, no interdigital maceration noted.  No open lesions present.  . No drainage noted. NAILS  Thick disfigured discolored nails with marker incurvation multiple nails.       Assessment & Plan:  Onychomycosis  Pincer Nails  Diabetes with neuropathy  IE  Debride Nails  RTC 3 months.  ABN signed for 2019.   Gardiner Barefoot DPM

## 2017-04-09 DIAGNOSIS — N289 Disorder of kidney and ureter, unspecified: Secondary | ICD-10-CM | POA: Diagnosis not present

## 2017-04-22 ENCOUNTER — Encounter: Payer: Medicare Other | Attending: Family Medicine | Admitting: Skilled Nursing Facility1

## 2017-04-22 ENCOUNTER — Encounter: Payer: Self-pay | Admitting: Skilled Nursing Facility1

## 2017-04-22 DIAGNOSIS — E1140 Type 2 diabetes mellitus with diabetic neuropathy, unspecified: Secondary | ICD-10-CM | POA: Diagnosis not present

## 2017-04-22 DIAGNOSIS — Z713 Dietary counseling and surveillance: Secondary | ICD-10-CM | POA: Diagnosis not present

## 2017-04-22 DIAGNOSIS — E119 Type 2 diabetes mellitus without complications: Secondary | ICD-10-CM

## 2017-04-22 NOTE — Progress Notes (Signed)
  Assessment:  Primary concerns today: referred for diabetes.   Pt states she has been here before. Pt states her husband is on dialysis, blind, and amputee all due to diabetes he also has cancer. Pt states she feels tired all the time. Pt states her Brother died of cancer in 2022-08-02.Pt states she started counseling last week. Pt states she has been Gaining weight.  A1C is 5.7. Pt states she does not sleep much due to her husbands medical issues. Pt states she takes a nap in the afternoon. Pt states lent starts tomorrow so that will help her made changes. Pt states she is addicted to food. Pt states she has a bowel movements every other day. Pt states she snacks excessively at night as an emotional crutch. Pt states she started attending Bible study and wants to start yoga at church. [facility] support groups was given to the pt.   MEDICATIONS: See List   DIETARY INTAKE:  Usual eating pattern includes 3 meals and multiple snacks per day.  Everyday foods include none stated.  Avoided foods include none stated.    24-hr recall:  B ( AM): cottage cheese and fruit or bagel and cream cheese  Snk ( AM):  L ( PM): sandwich and fruit Snk ( PM):  D ( PM): meat, rice, asparagus and a salad with raspberry vinaigrette dressing or ranch Snk ( PM): cookies and anything else in the kitchen Beverages: black coffee, water, arnold palmer  Usual physical activity: ADL's  Progress Towards Goal(s):  In progress.    Intervention:  Nutrition counseling.  Goals: -Go to the yoga class at church -Aim for 64 fluid ounces each day of water  Motivators:  Babies: getting down on the floor and playing and then getting up from the floor, taking her out to the park  Barriers:  Self-defeating  Strategy:  Recognize when you are being mean to yourself: and put down a tick mark and then look at these piece of paper and rephrase it:  For Example: Negative: "They know more about the bible than I do so they are better  than me"   Positive: "I am learning I can learn just as much as they did"   Negative: "They dress better than I do"   Positive: "They have a different style than I do I still look great!"  Teaching Method Utilized:  Visual Auditory Hands on  Barriers to learning/adherence to lifestyle change: emotional  Demonstrated degree of understanding via:  Teach Back   Monitoring/Evaluation:  Dietary intake, exercise, and body weight prn.

## 2017-04-22 NOTE — Patient Instructions (Addendum)
-  Go to the yoga class at church  -Aim for 64 fluid ounces each day of water    Motivators:  Babies: getting down on the floor and playing and then getting up from the floor, taking her out to the park  Barriers:  Self-defeating   Strategy:  Recognize when you are being mean to yourself: and put down a tick mark and then look at these piece of paper and rephrase it:  For Example: Negative: "They know more about the bible than I do so they are better than me"   Positive: "I am learning I can learn just as much as they did"    Negative: "They dress better than I do"    Positive: "They have a different style than I do I still look great!"

## 2017-05-26 ENCOUNTER — Encounter: Payer: Self-pay | Admitting: Skilled Nursing Facility1

## 2017-05-26 ENCOUNTER — Encounter: Payer: Medicare Other | Attending: Family Medicine | Admitting: Skilled Nursing Facility1

## 2017-05-26 DIAGNOSIS — E1140 Type 2 diabetes mellitus with diabetic neuropathy, unspecified: Secondary | ICD-10-CM | POA: Insufficient documentation

## 2017-05-26 DIAGNOSIS — Z713 Dietary counseling and surveillance: Secondary | ICD-10-CM | POA: Diagnosis not present

## 2017-05-26 DIAGNOSIS — E119 Type 2 diabetes mellitus without complications: Secondary | ICD-10-CM

## 2017-05-26 NOTE — Progress Notes (Signed)
  Assessment:  Primary concerns today: referred for diabetes.   Pt states she has been here before. Pt states her husband is on dialysis, blind, and amputee all due to diabetes he also has cancer. Pt states she feels tired all the time. Pt states her Brother died of cancer in 2022/07/19.Pt states she started counseling last week. Pt states she has been Gaining weight.  A1C is 5.7. Pt states she does not sleep much due to her husbands medical issues. Pt states she takes a nap in the afternoon. Pt states lent starts tomorrow so that will help her made changes. Pt states she is addicted to food. Pt states she has a bowel movements every other day. Pt states she snacks excessively at night as an emotional crutch. Pt states she started attending Bible study and wants to start yoga at church. [facility] support groups was given to the pt.   Pt arrived stating she [place] needing the paramedics. Pt states she has been very good to herself taking care of herself and caring for herself. Pt states she has been trying to do things for herself. Pt states she has started up counseling again. Pt states she has been eating healthy with normal portions. Pt states she has not been checking her blood sugars and has been eating 3 meals a day. Pt states she has been walking more.   MEDICATIONS: See List   DIETARY INTAKE:  Usual eating pattern includes 3 meals and multiple snacks per day.  Everyday foods include none stated.  Avoided foods include none stated.    24-hr recall:  B ( AM): cottage cheese and fruit or bagel and cream cheese  Snk ( AM):  L ( PM): sandwich and fruit Snk ( PM):  D ( PM): meat, rice, asparagus and a salad with raspberry vinaigrette dressing or ranch Snk ( PM): cookies and anything else in the kitchen Beverages: black coffee, water, arnold palmer  Usual physical activity: ADL's  Progress Towards Goal(s):  In progress.    Intervention:  Nutrition counseling.  Goals: Keep up the great  work!  Teaching Method Utilized:  Visual Auditory Hands on  Barriers to learning/adherence to lifestyle change: emotional  Demonstrated degree of understanding via:  Teach Back   Monitoring/Evaluation:  Dietary intake, exercise, and body weight prn.

## 2017-05-29 DIAGNOSIS — J40 Bronchitis, not specified as acute or chronic: Secondary | ICD-10-CM | POA: Diagnosis not present

## 2017-06-25 ENCOUNTER — Encounter: Payer: Self-pay | Admitting: Skilled Nursing Facility1

## 2017-06-25 ENCOUNTER — Encounter: Payer: Medicare Other | Attending: Family Medicine | Admitting: Skilled Nursing Facility1

## 2017-06-25 DIAGNOSIS — Z713 Dietary counseling and surveillance: Secondary | ICD-10-CM | POA: Insufficient documentation

## 2017-06-25 DIAGNOSIS — E119 Type 2 diabetes mellitus without complications: Secondary | ICD-10-CM

## 2017-06-25 DIAGNOSIS — E1140 Type 2 diabetes mellitus with diabetic neuropathy, unspecified: Secondary | ICD-10-CM | POA: Insufficient documentation
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
## 2017-06-27 ENCOUNTER — Ambulatory Visit (INDEPENDENT_AMBULATORY_CARE_PROVIDER_SITE_OTHER): Payer: Medicare Other | Admitting: Podiatry

## 2017-06-27 ENCOUNTER — Encounter: Payer: Self-pay | Admitting: Podiatry

## 2017-06-27 DIAGNOSIS — E1149 Type 2 diabetes mellitus with other diabetic neurological complication: Secondary | ICD-10-CM

## 2017-06-27 DIAGNOSIS — M79676 Pain in unspecified toe(s): Secondary | ICD-10-CM

## 2017-06-27 DIAGNOSIS — B351 Tinea unguium: Secondary | ICD-10-CM | POA: Diagnosis not present

## 2017-06-27 NOTE — Progress Notes (Signed)
Complaint:  Visit Type: Patient returns to my office for continued preventative foot care services. Complaint: Patient states" my nails have grown long and thick and become painful to walk and wear shoes" Patient has been diagnosed with DM with no foot complications. The patient presents for preventative foot care services. No changes to ROS  Podiatric Exam: Vascular: dorsalis pedis and posterior tibial pulses are palpable bilateral. Capillary return is immediate. Temperature gradient is WNL. Skin turgor WNL  Sensorium: Normal Semmes Weinstein monofilament test. Normal tactile sensation bilaterally. Nail Exam: Pt has thick disfigured discolored nails with subungual debris noted bilateral entire nail hallux through fifth toenails.  Pincer nails  B/L. Ulcer Exam: There is no evidence of ulcer or pre-ulcerative changes or infection. Orthopedic Exam: Muscle tone and strength are WNL. No limitations in general ROM. No crepitus or effusions noted. HAV 1st MPJ  B/L  DJD 1st IPJ  B/L Skin: No Porokeratosis. No infection or ulcers.    Diagnosis:  Onychomycosis, , Pain in right toe, pain in left toes  Treatment & Plan Procedures and Treatment: Consent by patient was obtained for treatment procedures.   Debridement of mycotic and hypertrophic toenails, 1 through 5 bilateral and clearing of subungual debris. No ulceration, no infection noted.  Return Visit-Office Procedure: Patient instructed to return to the office for a follow up visit 3 months for continued evaluation and treatment.    Katye Valek DPM 

## 2017-07-04 DIAGNOSIS — L853 Xerosis cutis: Secondary | ICD-10-CM | POA: Diagnosis not present

## 2017-07-04 DIAGNOSIS — Z6841 Body Mass Index (BMI) 40.0 and over, adult: Secondary | ICD-10-CM | POA: Diagnosis not present

## 2017-07-04 DIAGNOSIS — N289 Disorder of kidney and ureter, unspecified: Secondary | ICD-10-CM | POA: Diagnosis not present

## 2017-07-04 DIAGNOSIS — I10 Essential (primary) hypertension: Secondary | ICD-10-CM | POA: Diagnosis not present

## 2017-07-04 DIAGNOSIS — Z7984 Long term (current) use of oral hypoglycemic drugs: Secondary | ICD-10-CM | POA: Diagnosis not present

## 2017-07-04 DIAGNOSIS — E785 Hyperlipidemia, unspecified: Secondary | ICD-10-CM | POA: Diagnosis not present

## 2017-07-04 DIAGNOSIS — F325 Major depressive disorder, single episode, in full remission: Secondary | ICD-10-CM | POA: Diagnosis not present

## 2017-07-04 DIAGNOSIS — E1140 Type 2 diabetes mellitus with diabetic neuropathy, unspecified: Secondary | ICD-10-CM | POA: Diagnosis not present

## 2017-07-10 ENCOUNTER — Other Ambulatory Visit: Payer: Self-pay | Admitting: *Deleted

## 2017-07-10 ENCOUNTER — Other Ambulatory Visit: Payer: Self-pay | Admitting: Family Medicine

## 2017-07-10 DIAGNOSIS — N183 Chronic kidney disease, stage 3 unspecified: Secondary | ICD-10-CM

## 2017-07-24 DIAGNOSIS — H353111 Nonexudative age-related macular degeneration, right eye, early dry stage: Secondary | ICD-10-CM | POA: Diagnosis not present

## 2017-07-24 DIAGNOSIS — Z961 Presence of intraocular lens: Secondary | ICD-10-CM | POA: Diagnosis not present

## 2017-07-24 DIAGNOSIS — E119 Type 2 diabetes mellitus without complications: Secondary | ICD-10-CM | POA: Diagnosis not present

## 2017-07-25 DIAGNOSIS — Z23 Encounter for immunization: Secondary | ICD-10-CM | POA: Diagnosis not present

## 2017-07-30 ENCOUNTER — Ambulatory Visit: Payer: Medicare Other | Admitting: Skilled Nursing Facility1

## 2017-07-31 ENCOUNTER — Ambulatory Visit
Admission: RE | Admit: 2017-07-31 | Discharge: 2017-07-31 | Disposition: A | Discharge status: Home / Self Care | Payer: Medicare Other | Source: Ambulatory Visit | Attending: Family Medicine | Admitting: Family Medicine

## 2017-07-31 DIAGNOSIS — N183 Chronic kidney disease, stage 3 unspecified: Secondary | ICD-10-CM

## 2017-07-31 DIAGNOSIS — N189 Chronic kidney disease, unspecified: Secondary | ICD-10-CM | POA: Diagnosis not present

## 2017-09-23 ENCOUNTER — Encounter: Payer: Self-pay | Admitting: Podiatry

## 2017-09-23 ENCOUNTER — Ambulatory Visit (INDEPENDENT_AMBULATORY_CARE_PROVIDER_SITE_OTHER): Payer: Medicare Other | Admitting: Podiatry

## 2017-09-23 DIAGNOSIS — B351 Tinea unguium: Secondary | ICD-10-CM | POA: Diagnosis not present

## 2017-09-23 DIAGNOSIS — M79676 Pain in unspecified toe(s): Secondary | ICD-10-CM | POA: Diagnosis not present

## 2017-09-23 DIAGNOSIS — E1149 Type 2 diabetes mellitus with other diabetic neurological complication: Secondary | ICD-10-CM

## 2017-09-23 NOTE — Progress Notes (Signed)
Complaint:  Visit Type: Patient returns to my office for continued preventative foot care services. Complaint: Patient states" my nails have grown long and thick and become painful to walk and wear shoes" Patient has been diagnosed with DM with no foot complications. The patient presents for preventative foot care services. No changes to ROS  Podiatric Exam: Vascular: dorsalis pedis and posterior tibial pulses are palpable bilateral. Capillary return is immediate. Temperature gradient is WNL. Skin turgor WNL  Sensorium: Normal Semmes Weinstein monofilament test. Normal tactile sensation bilaterally. Nail Exam: Pt has thick disfigured discolored nails with subungual debris noted bilateral entire nail hallux through fifth toenails.  Pincer nails  B/L. Ulcer Exam: There is no evidence of ulcer or pre-ulcerative changes or infection. Orthopedic Exam: Muscle tone and strength are WNL. No limitations in general ROM. No crepitus or effusions noted. HAV 1st MPJ  B/L  DJD 1st IPJ  B/L Skin: No Porokeratosis. No infection or ulcers.    Diagnosis:  Onychomycosis, , Pain in right toe, pain in left toes  Treatment & Plan Procedures and Treatment: Consent by patient was obtained for treatment procedures.   Debridement of mycotic and hypertrophic toenails, 1 through 5 bilateral and clearing of subungual debris. No ulceration, no infection noted.  Return Visit-Office Procedure: Patient instructed to return to the office for a follow up visit 3 months for continued evaluation and treatment.    Gardiner Barefoot DPM

## 2017-09-26 ENCOUNTER — Ambulatory Visit: Payer: Medicare Other | Admitting: Podiatry

## 2017-10-14 DIAGNOSIS — Z23 Encounter for immunization: Secondary | ICD-10-CM | POA: Diagnosis not present

## 2017-10-14 DIAGNOSIS — N183 Chronic kidney disease, stage 3 (moderate): Secondary | ICD-10-CM | POA: Diagnosis not present

## 2017-10-14 DIAGNOSIS — E785 Hyperlipidemia, unspecified: Secondary | ICD-10-CM | POA: Diagnosis not present

## 2017-10-14 DIAGNOSIS — E1140 Type 2 diabetes mellitus with diabetic neuropathy, unspecified: Secondary | ICD-10-CM | POA: Diagnosis not present

## 2017-10-14 DIAGNOSIS — I10 Essential (primary) hypertension: Secondary | ICD-10-CM | POA: Diagnosis not present

## 2017-10-14 DIAGNOSIS — F324 Major depressive disorder, single episode, in partial remission: Secondary | ICD-10-CM | POA: Diagnosis not present

## 2017-10-14 DIAGNOSIS — E1121 Type 2 diabetes mellitus with diabetic nephropathy: Secondary | ICD-10-CM | POA: Diagnosis not present

## 2017-12-23 ENCOUNTER — Ambulatory Visit (INDEPENDENT_AMBULATORY_CARE_PROVIDER_SITE_OTHER): Payer: Medicare Other | Admitting: Podiatry

## 2017-12-23 ENCOUNTER — Encounter: Payer: Self-pay | Admitting: Podiatry

## 2017-12-23 DIAGNOSIS — M79676 Pain in unspecified toe(s): Secondary | ICD-10-CM | POA: Diagnosis not present

## 2017-12-23 DIAGNOSIS — B351 Tinea unguium: Secondary | ICD-10-CM | POA: Diagnosis not present

## 2017-12-23 DIAGNOSIS — E1149 Type 2 diabetes mellitus with other diabetic neurological complication: Secondary | ICD-10-CM | POA: Diagnosis not present

## 2017-12-23 NOTE — Progress Notes (Signed)
Complaint:  Visit Type: Patient returns to my office for continued preventative foot care services. Complaint: Patient states" my nails have grown long and thick and become painful to walk and wear shoes" Patient has been diagnosed with DM with no foot complications. The patient presents for preventative foot care services. No changes to ROS  Podiatric Exam: Vascular: dorsalis pedis and posterior tibial pulses are palpable bilateral. Capillary return is immediate. Temperature gradient is WNL. Skin turgor WNL  Sensorium: Normal Semmes Weinstein monofilament test. Normal tactile sensation bilaterally. Nail Exam: Pt has thick disfigured discolored nails with subungual debris noted bilateral entire nail hallux through fifth toenails.  Pincer nails  B/L. Ulcer Exam: There is no evidence of ulcer or pre-ulcerative changes or infection. Orthopedic Exam: Muscle tone and strength are WNL. No limitations in general ROM. No crepitus or effusions noted. HAV 1st MPJ  B/L  DJD 1st IPJ  B/L Skin: No Porokeratosis. No infection or ulcers.    Diagnosis:  Onychomycosis, , Pain in right toe, pain in left toes  Treatment & Plan Procedures and Treatment: Consent by patient was obtained for treatment procedures.   Debridement of mycotic and hypertrophic toenails, 1 through 5 bilateral and clearing of subungual debris. No ulceration, no infection noted.  Return Visit-Office Procedure: Patient instructed to return to the office for a follow up visit 3 months for continued evaluation and treatment.    Gardiner Barefoot DPM

## 2018-01-20 ENCOUNTER — Encounter: Payer: Self-pay | Admitting: Cardiology

## 2018-01-20 DIAGNOSIS — N183 Chronic kidney disease, stage 3 (moderate): Secondary | ICD-10-CM | POA: Diagnosis not present

## 2018-01-20 DIAGNOSIS — R0609 Other forms of dyspnea: Secondary | ICD-10-CM | POA: Diagnosis not present

## 2018-01-20 DIAGNOSIS — E1121 Type 2 diabetes mellitus with diabetic nephropathy: Secondary | ICD-10-CM | POA: Diagnosis not present

## 2018-01-20 DIAGNOSIS — E1140 Type 2 diabetes mellitus with diabetic neuropathy, unspecified: Secondary | ICD-10-CM | POA: Diagnosis not present

## 2018-01-21 ENCOUNTER — Other Ambulatory Visit: Payer: Self-pay | Admitting: Family Medicine

## 2018-01-21 ENCOUNTER — Ambulatory Visit
Admission: RE | Admit: 2018-01-21 | Discharge: 2018-01-21 | Disposition: A | Discharge status: Home / Self Care | Payer: Medicare Other | Source: Ambulatory Visit | Attending: Family Medicine | Admitting: Family Medicine

## 2018-01-21 DIAGNOSIS — R0609 Other forms of dyspnea: Principal | ICD-10-CM

## 2018-01-21 DIAGNOSIS — J90 Pleural effusion, not elsewhere classified: Secondary | ICD-10-CM | POA: Diagnosis not present

## 2018-01-26 ENCOUNTER — Other Ambulatory Visit (HOSPITAL_COMMUNITY): Payer: Self-pay | Admitting: Family Medicine

## 2018-01-26 DIAGNOSIS — R0609 Other forms of dyspnea: Secondary | ICD-10-CM

## 2018-01-26 DIAGNOSIS — R7989 Other specified abnormal findings of blood chemistry: Secondary | ICD-10-CM
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
## 2018-01-26 NOTE — H&P (View-Only) (Signed)
Cardiology Office Note   Date:  01/30/2018   ID:  Shannon Carroll, DOB 05/31/1950, MRN [id number]  PCP:  Harlan Stains, MD  Cardiologist:   No primary care provider on file. Referring:  Harlan Stains, MD  Chief Complaint  Patient presents with  . Shortness of Breath      History of Present Illness: Shannon Carroll is a 67 y.o. female who presents for evaluation of DOE.  She has no past cardiac history.  Over the last couple of weeks she has had a change in her condition.  She has become confused.  She drove from [place] on Thanksgiving and she got lost and went off the road.  She has become profoundly weak.  She has had no change in her weight.  She has had no new edema.  She is had increased shortness of breath however.  She is short of breath doing mild activities.  She is had no new PND or orthopnea.  She has not noticed any palpitations, presyncope or syncope.  She was found to have an elevated BNP.  She was started on carvedilol.  However, does not seem like she has been taking this.  She was on Diovan and HCTZ but it was not clear that she was taking this.  It is not entirely clear why she was not taking any of her medications.  She chronically sleeps in a chair and this has not changed.  This is been for about a year.  She has had some urinary incontinence.  She has morbid obesity and has some trouble getting around but we were able to walk around the office today and measure oxygen saturations.  It essentially stayed at 93%.  She has had some mild anemia from the left ICD.  Electrolytes have demonstrated a slightly elevated creatinine with a glomerular filtration rate of 49.  I do not see thyroid testing although I understand that it was done per her report.  Apparently there was a urinalysis which was normal as well.  Yesterday she had an echocardiogram.  I reviewed these images for this visit.  There was some moderate aortic stenosis.  There was moderate mitral stenosis.  She does  have left ventricular hypertrophy with diastolic dysfunction but normal systolic function.   Past Medical History:  Diagnosis Date  . Adenomatous polyps 12/14   f/u 5 yrs dr Penelope Coop  . Arthritis   . Chronic kidney disease    stage 2  . Depression   . Diabetes mellitus without complication ([place])   . Hirsutism   . Hyperlipidemia   . Hypertension   . Murmur   . Obesity     Past Surgical History:  Procedure Laterality Date  . CESAREAN SECTION  12/23/1980  . COLONOSCOPY  06/2005-09/2007  . DILATION AND CURETTAGE OF UTERUS    . TONSILLECTOMY AND ADENOIDECTOMY  1956     Current Outpatient Medications  Medication Sig Dispense Refill  . aspirin 81 MG tablet Take 81 mg by mouth daily.    . metFORMIN (GLUCOPHAGE) 1000 MG tablet Take 1,000 mg by mouth 2 (two) times daily with a meal.    . simvastatin (ZOCOR) 40 MG tablet Take 40 mg by mouth daily.    Marland Kitchen venlafaxine (EFFEXOR) 37.5 MG tablet Take 3 tablets by mouth in the morning and 1 tablet at night.     No current facility-administered medications for this visit.     Allergies:   Lisinopril    Social History:  The patient  reports that she quit smoking about 24 years ago. She has never used smokeless tobacco. She reports current alcohol use. She reports that she does not use drugs.   Family History:  The patient's family history includes CAD in her father; Diabetes in her brother, mother, and sister; Heart attack in her father; Heart failure in her mother; Hypertension in her brother, father, mother, and sister; Skin cancer in her father.    ROS:  Please see the history of present illness.   Otherwise, review of systems are positive for none.   All other systems are reviewed and negative.    PHYSICAL EXAM: VS:  BP (!) 86/56 Comment: pt states she is not dizzy, however, has been exhausted  Pulse 79   Ht 5\' 4"  (1.626 m)   Wt 297 lb 6.4 oz (134.9 kg)   BMI 51.05 kg/m  , BMI Body mass index is 51.05 kg/m. GENERAL:  Well  appearing HEENT:  Pupils equal round and reactive, fundi not visualized, oral mucosa unremarkable NECK:  No jugular venous distention, waveform within normal limits, carotid upstroke brisk and symmetric, no bruits, no thyromegaly LYMPHATICS:  No cervical, inguinal adenopathy LUNGS:  Clear to auscultation bilaterally BACK:  No CVA tenderness CHEST:  Unremarkable HEART:  PMI not displaced or sustained,S1 and S2 within normal limits, no S3, no S4, no clicks, no rubs, 3 out of 6 apical systolic murmur radiating slightly at the aortic outflow tract, no diastolic murmurs ABD:  Flat, positive bowel sounds normal in frequency in pitch, no bruits, no rebound, no guarding, no midline pulsatile mass, no hepatomegaly, no splenomegaly EXT:  2 plus pulses throughout, no edema, no cyanosis no clubbing SKIN:  No rashes no nodules NEURO:  Cranial nerves II through XII grossly intact, motor grossly intact throughout PSYCH:  Cognitively intact, oriented to person place and time    EKG:  EKG is ordered today. The ekg ordered today demonstrates sinus rhythm, rate 79, axis within normal limits, intervals within normal limits, no acute ST-T wave changes   Recent Labs: No results found for requested labs within last 8760 hours.    Lipid Panel No results found for: CHOL, TRIG, HDL, CHOLHDL, VLDL, LDLCALC, LDLDIRECT    Wt Readings from Last 3 Encounters:  01/30/18 297 lb 6.4 oz (134.9 kg)  06/25/17 299 lb 9.6 oz (135.9 kg)  05/26/17 (!) 301 lb 4.8 oz (136.7 kg)      Other studies Reviewed: Additional studies/ records that were reviewed today include: echo images reviewed, labs. Review of the above records demonstrates:  Please see elsewhere in the note.     ASSESSMENT AND PLAN:  DOE: This may be multifactorial.  She has morbid obesity and deconditioning.  However, she clearly has valvular abnormalities with heavily calcified mitral valve and some stenosis of her aortic valve.  There is some diastolic  dysfunction.  I think to sort this out to the most useful study will be right and left heart catheterization.  I have low pretest suspicion of obstructive coronary disease but she will get angiography with this.  Further evaluation and management will be based on these results.  She needs salt and fluid restriction to begin with for some element of diastolic dysfunction.  HIGH EPWORTH SCALE: She clearly has multiple symptoms and a high Epworth scale consistent with sleep apnea and needs sleep study.  AS/MS: This will be evaluated as above.  MORBID OBESITY: She needs significant weight loss with diet to begin with.  HTN: Her blood pressures actually running quite low and I am going to stop the carvedilol.  She is not on antihypertensives for now.     Current medicines are reviewed at length with the patient today.  The patient does not have concerns regarding medicines.  The following changes have been made:  no change  Labs/ tests ordered today include:   Orders Placed This Encounter  Procedures  . CBC  . Basic Metabolic Panel (BMET)  . TSH  . EKG 12-Lead  . Split night study     Disposition:   FU with me after the cath.     Signed, Minus Breeding, MD  01/30/2018 3:58 PM    [place]

## 2018-01-27 DIAGNOSIS — R32 Unspecified urinary incontinence: Secondary | ICD-10-CM | POA: Diagnosis not present

## 2018-01-27 DIAGNOSIS — R410 Disorientation, unspecified: Secondary | ICD-10-CM | POA: Diagnosis not present

## 2018-01-27 DIAGNOSIS — I509 Heart failure, unspecified: Secondary | ICD-10-CM | POA: Diagnosis not present

## 2018-01-27 DIAGNOSIS — R419 Unspecified symptoms and signs involving cognitive functions and awareness: Secondary | ICD-10-CM | POA: Diagnosis not present

## 2018-01-27 DIAGNOSIS — I959 Hypotension, unspecified: Secondary | ICD-10-CM | POA: Diagnosis not present

## 2018-01-29 ENCOUNTER — Ambulatory Visit (HOSPITAL_COMMUNITY): Payer: Medicare Other | Attending: Cardiovascular Disease

## 2018-01-29 ENCOUNTER — Other Ambulatory Visit: Payer: Self-pay

## 2018-01-29 DIAGNOSIS — R0609 Other forms of dyspnea: Secondary | ICD-10-CM | POA: Diagnosis not present

## 2018-01-29 DIAGNOSIS — R7989 Other specified abnormal findings of blood chemistry: Secondary | ICD-10-CM | POA: Diagnosis not present

## 2018-01-29 MED ORDER — PERFLUTREN LIPID MICROSPHERE
1.0000 mL | INTRAVENOUS | Status: AC | PRN
  Administered 2018-01-29: 2 mL via INTRAVENOUS

## 2018-01-30 ENCOUNTER — Ambulatory Visit (INDEPENDENT_AMBULATORY_CARE_PROVIDER_SITE_OTHER): Payer: Medicare Other | Admitting: Cardiology

## 2018-01-30 ENCOUNTER — Encounter: Payer: Self-pay | Admitting: Cardiology

## 2018-01-30 VITALS — BP 86/56 | HR 79 | Ht 64.0 in | Wt 297.4 lb

## 2018-01-30 DIAGNOSIS — Z01812 Encounter for preprocedural laboratory examination: Secondary | ICD-10-CM | POA: Insufficient documentation

## 2018-01-30 DIAGNOSIS — R5383 Other fatigue: Secondary | ICD-10-CM | POA: Diagnosis not present

## 2018-01-30 DIAGNOSIS — I050 Rheumatic mitral stenosis: Secondary | ICD-10-CM | POA: Diagnosis not present

## 2018-01-30 DIAGNOSIS — I350 Nonrheumatic aortic (valve) stenosis: Secondary | ICD-10-CM | POA: Diagnosis not present

## 2018-01-30 DIAGNOSIS — R0683 Snoring: Secondary | ICD-10-CM | POA: Diagnosis not present

## 2018-01-30 DIAGNOSIS — R0609 Other forms of dyspnea: Secondary | ICD-10-CM

## 2018-01-30 DIAGNOSIS — G4730 Sleep apnea, unspecified: Secondary | ICD-10-CM | POA: Insufficient documentation

## 2018-01-30 DIAGNOSIS — Z111 Encounter for screening for respiratory tuberculosis: Secondary | ICD-10-CM | POA: Diagnosis not present

## 2018-01-30 NOTE — Patient Instructions (Addendum)
Medication Instructions:  STOP- Carvedilol  If you need a refill on your cardiac medications before your next appointment, please call your pharmacy.  Labwork: Pre Op Labs HERE IN OUR OFFICE AT [facility] Take the provided lab slips with you to the lab for your blood draw.   You will NOT need to fast   If you have labs (blood work) drawn today and your tests are completely normal, you will receive your results only by [place] (if you have MyChart) -OR- A paper copy in the mail.  If you have any lab test that is abnormal or we need to change your treatment, we will call you to review these results.  Testing/Procedures: Your physician has requested that you have a Right and Left cardiac catheterization. Cardiac catheterization is used to diagnose and/or treat various heart conditions. Doctors may recommend this procedure for a number of different reasons. The most common reason is to evaluate chest pain. Chest pain can be a symptom of coronary artery disease (CAD), and cardiac catheterization can show whether plaque is narrowing or blocking your heart's arteries. This procedure is also used to evaluate the valves, as well as measure the blood flow and oxygen levels in different parts of your heart. For further information please visit [facility]. Please follow instruction sheet, as given.  Special Instructions:    [place] [facility] [place] [address] Dept: [phone] Loc: [place]  01/30/2018  You are scheduled for a Cardiac Catheterization on Wednesday, December 18 with Dr. Harrell Gave End.  1. Please arrive at the [facility] (Main Entrance A) at [facility]: [place], [place] [address] at 8:00 AM (This time is two hours before your procedure to ensure your preparation). Free valet parking service is available.   Special note:  Every effort is made to have your procedure done on time. Please understand that emergencies sometimes delay scheduled procedures.  2. Diet: Do not eat solid foods after midnight.  The patient may have clear liquids until 5am upon the day of the procedure.  3. Labs: You will need to have blood drawn on TodayYou do not need to be fasting.  4. Medication instructions in preparation for your procedure:   Contrast Allergy: No   Do not take Diabetes Med Glucophage (Metformin) on the day of the procedure and HOLD 48 HOURS AFTER THE PROCEDURE.  On the morning of your procedure, take your Aspirin and any morning medicines NOT listed above.  You may use sips of water.  5. Plan for one night stay--bring personal belongings. 6. Bring a current list of your medications and current insurance cards. 7. You MUST have a responsible person to drive you home. 8. Someone MUST be with you the first 24 hours after you arrive home or your discharge will be delayed. 9. Please wear clothes that are easy to get on and off and wear slip-on shoes.  Thank you for allowing [place] to care for you!   -- [facility]    Follow-Up: . Your physician recommends that you schedule a follow-up appointment in: After heart cath  At [facility], you and your health needs are our priority.  As part of our continuing mission to provide you with exceptional heart care, we have created designated Provider Care Teams.  These Care Teams include your primary Cardiologist (physician) and Advanced Practice Providers (APPs -  Physician Assistants  and Nurse Practitioners) who all work together to provide you with the care you need, when you need it.

## 2018-01-31 LAB — BASIC METABOLIC PANEL
BUN/Creatinine Ratio: 24 (ref 12–28)
BUN: 30 mg/dL — AB (ref 8–27)
CALCIUM: 9.4 mg/dL (ref 8.7–10.3)
CHLORIDE: 99 mmol/L (ref 96–106)
CO2: 29 mmol/L (ref 20–29)
Creatinine, Ser: 1.27 mg/dL — ABNORMAL HIGH (ref 0.57–1.00)
GFR calc non Af Amer: 44 mL/min/{1.73_m2} — ABNORMAL LOW (ref >59)
GFR, EST AFRICAN AMERICAN: 50 mL/min/{1.73_m2} — AB (ref >59)
GLUCOSE: 84 mg/dL (ref 65–99)
POTASSIUM: 4.1 mmol/L (ref 3.5–5.2)
Sodium: 144 mmol/L (ref 134–144)

## 2018-01-31 LAB — CBC
Hematocrit: 34.4 % (ref 34.0–46.6)
Hemoglobin: 11.5 g/dL (ref 11.1–15.9)
MCH: 28.8 pg (ref 26.6–33.0)
MCHC: 33.4 g/dL (ref 31.5–35.7)
MCV: 86 fL (ref 79–97)
PLATELETS: 275 10*3/uL (ref 150–450)
RBC: 3.99 x10E6/uL (ref 3.77–5.28)
RDW: 13.1 % (ref 12.3–15.4)
WBC: 10.4 10*3/uL (ref 3.4–10.8)

## 2018-01-31 LAB — TSH: TSH: 1.62 u[IU]/mL (ref 0.450–4.500)

## 2018-02-03 ENCOUNTER — Other Ambulatory Visit: Payer: Self-pay | Admitting: Family Medicine

## 2018-02-03 ENCOUNTER — Telehealth: Payer: Self-pay | Admitting: *Deleted

## 2018-02-03 DIAGNOSIS — R413 Other amnesia: Secondary | ICD-10-CM

## 2018-02-03 NOTE — Telephone Encounter (Signed)
I reviewed instructions with patient, she verbalized understanding, thanked me for call.

## 2018-02-03 NOTE — Telephone Encounter (Signed)
No answer, no voicemail.

## 2018-02-03 NOTE — Telephone Encounter (Signed)
Pt contacted pre-catheterization scheduled at [facility] for: Wednesday February 04, 2018 1 PM Verified arrival time and place: [place] Entrance A at: 8 AM-pre procedure hydration/no LV gram per Dr Phillips Grout with Tawnya Crook notes on cath schedule.   No solid food after midnight prior to cath, clear liquids until 5 AM day of procedure. Contrast allergy: no  Hold: Metfomrin-day of procedure and 48 hours post procedure.  Except hold medication AM meds can be  taken pre-cath with sip of water including: ASA 81 mg  Confirm patient has responsible person to drive home post procedure and for 24 hours after you arrive home.  Pt not available, call back 11 AM.

## 2018-02-04 ENCOUNTER — Encounter (HOSPITAL_COMMUNITY)
Admission: RE | Disposition: A | Discharge status: Home / Self Care | Payer: Self-pay | Source: Home / Self Care | Attending: Internal Medicine

## 2018-02-04 ENCOUNTER — Telehealth: Payer: Self-pay

## 2018-02-04 ENCOUNTER — Other Ambulatory Visit: Payer: Self-pay

## 2018-02-04 ENCOUNTER — Ambulatory Visit (HOSPITAL_COMMUNITY)
Admission: RE | Admit: 2018-02-04 | Discharge: 2018-02-04 | Disposition: A | Discharge status: Home / Self Care | Payer: Medicare Other | Attending: Internal Medicine | Admitting: Internal Medicine

## 2018-02-04 DIAGNOSIS — Z7984 Long term (current) use of oral hypoglycemic drugs: Secondary | ICD-10-CM | POA: Diagnosis not present

## 2018-02-04 DIAGNOSIS — Z87891 Personal history of nicotine dependence: Secondary | ICD-10-CM | POA: Diagnosis not present

## 2018-02-04 DIAGNOSIS — N189 Chronic kidney disease, unspecified: Secondary | ICD-10-CM

## 2018-02-04 DIAGNOSIS — M1990 Unspecified osteoarthritis, unspecified site: Secondary | ICD-10-CM | POA: Insufficient documentation

## 2018-02-04 DIAGNOSIS — E1122 Type 2 diabetes mellitus with diabetic chronic kidney disease: Secondary | ICD-10-CM | POA: Insufficient documentation

## 2018-02-04 DIAGNOSIS — I080 Rheumatic disorders of both mitral and aortic valves: Secondary | ICD-10-CM | POA: Diagnosis not present

## 2018-02-04 DIAGNOSIS — Z833 Family history of diabetes mellitus: Secondary | ICD-10-CM | POA: Diagnosis not present

## 2018-02-04 DIAGNOSIS — E785 Hyperlipidemia, unspecified: Secondary | ICD-10-CM | POA: Diagnosis not present

## 2018-02-04 DIAGNOSIS — Z7982 Long term (current) use of aspirin: Secondary | ICD-10-CM | POA: Diagnosis not present

## 2018-02-04 DIAGNOSIS — Z888 Allergy status to other drugs, medicaments and biological substances status: Secondary | ICD-10-CM | POA: Insufficient documentation

## 2018-02-04 DIAGNOSIS — Z79899 Other long term (current) drug therapy: Secondary | ICD-10-CM | POA: Diagnosis not present

## 2018-02-04 DIAGNOSIS — R0602 Shortness of breath: Secondary | ICD-10-CM | POA: Diagnosis present

## 2018-02-04 DIAGNOSIS — I050 Rheumatic mitral stenosis: Secondary | ICD-10-CM | POA: Diagnosis present

## 2018-02-04 DIAGNOSIS — Z6841 Body Mass Index (BMI) 40.0 and over, adult: Secondary | ICD-10-CM | POA: Diagnosis not present

## 2018-02-04 DIAGNOSIS — I350 Nonrheumatic aortic (valve) stenosis: Secondary | ICD-10-CM

## 2018-02-04 DIAGNOSIS — Z8249 Family history of ischemic heart disease and other diseases of the circulatory system: Secondary | ICD-10-CM | POA: Insufficient documentation

## 2018-02-04 DIAGNOSIS — I2510 Atherosclerotic heart disease of native coronary artery without angina pectoris: Secondary | ICD-10-CM | POA: Diagnosis not present

## 2018-02-04 DIAGNOSIS — I129 Hypertensive chronic kidney disease with stage 1 through stage 4 chronic kidney disease, or unspecified chronic kidney disease: Secondary | ICD-10-CM | POA: Insufficient documentation

## 2018-02-04 HISTORY — PX: RIGHT/LEFT HEART CATH AND CORONARY ANGIOGRAPHY: CATH118266

## 2018-02-04 LAB — POCT ACTIVATED CLOTTING TIME
Activated Clotting Time: 164 seconds
Activated Clotting Time: 186 seconds

## 2018-02-04 LAB — POCT I-STAT 3, VENOUS BLOOD GAS (G3P V)
Acid-Base Excess: 4 mmol/L — ABNORMAL HIGH (ref 0.0–2.0)
Acid-Base Excess: 4 mmol/L — ABNORMAL HIGH (ref 0.0–2.0)
Bicarbonate: 29.5 mmol/L — ABNORMAL HIGH (ref 20.0–28.0)
Bicarbonate: 30 mmol/L — ABNORMAL HIGH (ref 20.0–28.0)
O2 Saturation: 76 %
O2 Saturation: 76 %
PCO2 VEN: 50.2 mmHg (ref 44.0–60.0)
TCO2: 32 mmol/L (ref 22–32)
TCO2: 31 mmol/L (ref 22–32)
pCO2, Ven: 49.5 mmHg (ref 44.0–60.0)
pH, Ven: 7.383 (ref 7.250–7.430)
pH, Ven: 7.384 (ref 7.250–7.430)
pO2, Ven: 42 mmHg (ref 32.0–45.0)
pO2, Ven: 42 mmHg (ref 32.0–45.0)

## 2018-02-04 LAB — POCT I-STAT 3, ART BLOOD GAS (G3+)
Acid-Base Excess: 3 mmol/L — ABNORMAL HIGH (ref 0.0–2.0)
Bicarbonate: 28.1 mmol/L — ABNORMAL HIGH (ref 20.0–28.0)
O2 SAT: 99 %
TCO2: 29 mmol/L (ref 22–32)
pCO2 arterial: 43.4 mmHg (ref 32.0–48.0)
pH, Arterial: 7.419 (ref 7.350–7.450)
pO2, Arterial: 129 mmHg — ABNORMAL HIGH (ref 83.0–108.0)

## 2018-02-04 LAB — GLUCOSE, CAPILLARY
Glucose-Capillary: 80 mg/dL (ref 70–99)
Glucose-Capillary: 106 mg/dL — ABNORMAL HIGH (ref 70–99)

## 2018-02-04 SURGERY — RIGHT/LEFT HEART CATH AND CORONARY ANGIOGRAPHY
Anesthesia: LOCAL

## 2018-02-04 MED ORDER — ASPIRIN 81 MG PO CHEW: 81.0000 mg | CHEWABLE_TABLET | ORAL | Status: DC

## 2018-02-04 MED ORDER — VERAPAMIL HCL 2.5 MG/ML IV SOLN
INTRAVENOUS | Status: DC | PRN
  Administered 2018-02-04: 10 mL via INTRA_ARTERIAL

## 2018-02-04 MED ORDER — LIDOCAINE HCL (PF) 1 % IJ SOLN
INTRAMUSCULAR | Status: AC
  Filled 2018-02-04: qty 30

## 2018-02-04 MED ORDER — MIDAZOLAM HCL 2 MG/2ML IJ SOLN
INTRAMUSCULAR | Status: DC | PRN
  Administered 2018-02-04 (×2): 0.5 mg via INTRAVENOUS

## 2018-02-04 MED ORDER — SODIUM CHLORIDE 0.9 % IV SOLN
INTRAVENOUS | Status: DC
  Administered 2018-02-04: 15:00:00 via INTRAVENOUS

## 2018-02-04 MED ORDER — SODIUM CHLORIDE 0.9 % IV SOLN: 250.0000 mL | INTRAVENOUS | Status: DC | PRN

## 2018-02-04 MED ORDER — SODIUM CHLORIDE 0.9 % WEIGHT BASED INFUSION
3.0000 mL/kg/h | INTRAVENOUS | Status: DC
  Administered 2018-02-04: 3 mL/kg/h via INTRAVENOUS

## 2018-02-04 MED ORDER — HEPARIN (PORCINE) IN NACL 1000-0.9 UT/500ML-% IV SOLN
INTRAVENOUS | Status: DC | PRN
  Administered 2018-02-04 (×3): 500 mL

## 2018-02-04 MED ORDER — LIDOCAINE HCL (PF) 1 % IJ SOLN
INTRAMUSCULAR | Status: DC | PRN
  Administered 2018-02-04: 2 mL
  Administered 2018-02-04: 20 mL
  Administered 2018-02-04: 2 mL

## 2018-02-04 MED ORDER — HEPARIN (PORCINE) IN NACL 1000-0.9 UT/500ML-% IV SOLN
INTRAVENOUS | Status: AC
  Filled 2018-02-04: qty 1000

## 2018-02-04 MED ORDER — HEPARIN SODIUM (PORCINE) 1000 UNIT/ML IJ SOLN
INTRAMUSCULAR | Status: DC | PRN
  Administered 2018-02-04: 5000 [IU] via INTRAVENOUS

## 2018-02-04 MED ORDER — MIDAZOLAM HCL 2 MG/2ML IJ SOLN
INTRAMUSCULAR | Status: AC
  Filled 2018-02-04: qty 2

## 2018-02-04 MED ORDER — HYDRALAZINE HCL 20 MG/ML IJ SOLN
INTRAMUSCULAR | Status: AC
  Filled 2018-02-04: qty 1

## 2018-02-04 MED ORDER — HEPARIN SODIUM (PORCINE) 1000 UNIT/ML IJ SOLN
INTRAMUSCULAR | Status: AC
  Filled 2018-02-04: qty 1

## 2018-02-04 MED ORDER — ONDANSETRON HCL 4 MG/2ML IJ SOLN: 4.0000 mg | Freq: Four times a day (QID) | INTRAMUSCULAR | Status: DC | PRN

## 2018-02-04 MED ORDER — HYDRALAZINE HCL 20 MG/ML IJ SOLN
10.0000 mg | INTRAMUSCULAR | Status: DC
  Administered 2018-02-04: 10 mg via INTRAVENOUS

## 2018-02-04 MED ORDER — IOHEXOL 350 MG/ML SOLN
INTRAVENOUS | Status: DC | PRN
  Administered 2018-02-04: 70 mL via INTRA_ARTERIAL

## 2018-02-04 MED ORDER — FENTANYL CITRATE (PF) 100 MCG/2ML IJ SOLN
INTRAMUSCULAR | Status: DC | PRN
  Administered 2018-02-04 (×3): 25 ug via INTRAVENOUS

## 2018-02-04 MED ORDER — HEPARIN (PORCINE) IN NACL 1000-0.9 UT/500ML-% IV SOLN
INTRAVENOUS | Status: AC
  Filled 2018-02-04: qty 500

## 2018-02-04 MED ORDER — SODIUM CHLORIDE 0.9% FLUSH: 3.0000 mL | INTRAVENOUS | Status: DC | PRN

## 2018-02-04 MED ORDER — SODIUM CHLORIDE 0.9% FLUSH: 3.0000 mL | Freq: Two times a day (BID) | INTRAVENOUS | Status: DC

## 2018-02-04 MED ORDER — SODIUM CHLORIDE 0.9 % WEIGHT BASED INFUSION: 1.0000 mL/kg/h | INTRAVENOUS | Status: DC

## 2018-02-04 MED ORDER — FENTANYL CITRATE (PF) 100 MCG/2ML IJ SOLN
INTRAMUSCULAR | Status: AC
  Filled 2018-02-04: qty 2

## 2018-02-04 SURGICAL SUPPLY — 18 items
CATH 5FR JL3.5 JR4 ANG PIG MP (CATHETERS) ×2 IMPLANT
CATH BALLN WEDGE 5F 110CM (CATHETERS) ×2 IMPLANT
DEVICE RAD COMP TR BAND LRG (VASCULAR PRODUCTS) ×2 IMPLANT
GLIDESHEATH SLEND SS 6F .021 (SHEATH) ×2 IMPLANT
GUIDEWIRE INQWIRE 1.5J.035X260 (WIRE) ×1 IMPLANT
HOVERMATT SINGLE USE (MISCELLANEOUS) ×2 IMPLANT
INQWIRE 1.5J .035X260CM (WIRE) ×2
KIT HEART LEFT (KITS) ×2 IMPLANT
KIT MICROPUNCTURE NIT STIFF (SHEATH) ×2 IMPLANT
PACK CARDIAC CATHETERIZATION (CUSTOM PROCEDURE TRAY) ×2 IMPLANT
SHEATH GLIDE SLENDER 4/5FR (SHEATH) ×2 IMPLANT
SHEATH PINNACLE 5F 10CM (SHEATH) ×2 IMPLANT
TRANSDUCER W/STOPCOCK (MISCELLANEOUS) ×4 IMPLANT
TUBING ART PRESS 72  MALE/FEM (TUBING) ×1
TUBING ART PRESS 72 MALE/FEM (TUBING) ×1 IMPLANT
TUBING CIL FLEX 10 FLL-RA (TUBING) ×2 IMPLANT
WIRE EMERALD 3MM-J .035X150CM (WIRE) ×2 IMPLANT
WIRE HI TORQ VERSACORE-J 145CM (WIRE) ×2 IMPLANT

## 2018-02-04 NOTE — Brief Op Note (Signed)
BRIEF CARDIAC CATHETERIZATION NOTE  DATE: 02/04/2018 TIME: 2:54 PM  PATIENT:  Shannon Carroll  67 y.o. female  PRE-OPERATIVE DIAGNOSIS:  Shortness of breath, aortic stenosis, and mitral stenosis  POST-OPERATIVE DIAGNOSIS:  Same  PROCEDURE:  Procedure(s): RIGHT/LEFT HEART CATH AND CORONARY ANGIOGRAPHY (N/A)  SURGEON:  Surgeon(s) and Role:    * Ondine Gemme, MD - Primary  FINDINGS: 1. Mild to moderate, non-obstructive coronary artery disease. 2. Very mild aortic stenosis. 3. Probably moderate mitral stenosis as well as suspected significant mitral regurgitation. 4. Mildly elevated left heart filling pressures. 5. Moderate pulmonary hypertension. 6. Normal Fick cardiac output/index.  RECOMMENDATIONS: 1. Consider TEE for further assessment of mitral valve. 2. Medical therapy of non-obstructive CAD. 3. Check BMP on Friday or Monday (before metformin is restarted).  Nelva Bush, MD [facility] Pager: [phone]

## 2018-02-04 NOTE — Discharge Instructions (Signed)
We will make arrangements for you to have labs done on Friday or Monday to reassess your kidney function.  Do not restart metformin until labs have been completed and you have been instructed to restart this medication.   Radial Site Care  This sheet gives you information about how to care for yourself after your procedure. Your health care provider may also give you more specific instructions. If you have problems or questions, contact your health care provider. What can I expect after the procedure? After the procedure, it is common to have:  Bruising and tenderness at the catheter insertion area. Follow these instructions at home: Medicines  Take over-the-counter and prescription medicines only as told by your health care provider. Insertion site care  Follow instructions from your health care provider about how to take care of your insertion site. Make sure you: ? Wash your hands with soap and water before you change your bandage (dressing). If soap and water are not available, use hand sanitizer. ? Change your dressing as told by your health care provider. ? Leave stitches (sutures), skin glue, or adhesive strips in place. These skin closures may need to stay in place for 2 weeks or longer. If adhesive strip edges start to loosen and curl up, you may trim the loose edges. Do not remove adhesive strips completely unless your health care provider tells you to do that.  Check your insertion site every day for signs of infection. Check for: ? Redness, swelling, or pain. ? Fluid or blood. ? Pus or a bad smell. ? Warmth.  Do not take baths, swim, or use a hot tub until your health care provider approves.  You may shower 24-48 hours after the procedure, or as directed by your health care provider. ? Remove the dressing and gently wash the site with plain soap and water. ? Pat the area dry with a clean towel. ? Do not rub the site. That could cause bleeding.  Do not apply powder or  lotion to the site. Activity   For 24 hours after the procedure, or as directed by your health care provider: ? Do not flex or bend the affected arm. ? Do not push or pull heavy objects with the affected arm. ? Do not drive yourself home from the hospital or clinic. You may drive 24 hours after the procedure unless your health care provider tells you not to. ? Do not operate machinery or power tools.  Do not lift anything that is heavier than 10 lb (4.5 kg), or the limit that you are told, until your health care provider says that it is safe.  Ask your health care provider when it is okay to: ? Return to work or school. ? Resume usual physical activities or sports. ? Resume sexual activity. General instructions  If the catheter site starts to bleed, raise your arm and put firm pressure on the site. If the bleeding does not stop, get help right away. This is a medical emergency.  If you went home on the same day as your procedure, a responsible adult should be with you for the first 24 hours after you arrive home.  Keep all follow-up visits as told by your health care provider. This is important. Contact a health care provider if:  You have a fever.  You have redness, swelling, or yellow drainage around your insertion site. Get help right away if:  You have unusual pain at the radial site.  The catheter insertion area swells  very fast.  The insertion area is bleeding, and the bleeding does not stop when you hold steady pressure on the area.  Your arm or hand becomes pale, cool, tingly, or numb. These symptoms may represent a serious problem that is an emergency. Do not wait to see if the symptoms will go away. Get medical help right away. Call your local emergency services (911 in the [place] Do not drive yourself to the hospital. Summary  After the procedure, it is common to have bruising and tenderness at the site.  Follow instructions from your health care provider about  how to take care of your radial site wound. Check the wound every day for signs of infection.  Do not lift anything that is heavier than 10 lb (4.5 kg), or the limit that you are told, until your health care provider says that it is safe. This information is not intended to replace advice given to you by your health care provider. Make sure you discuss any questions you have with your health care provider. Document Released: 03/09/2010 Document Revised: 03/12/2017 Document Reviewed: 03/12/2017 Elsevier Interactive Patient Education  2019 [place].   Femoral Site Care This sheet gives you information about how to care for yourself after your procedure. Your health care provider may also give you more specific instructions. If you have problems or questions, contact your health care provider. What can I expect after the procedure? After the procedure, it is common to have:  Bruising that usually fades within 1-2 weeks.  Tenderness at the site. Follow these instructions at home: Wound care  Follow instructions from your health care provider about how to take care of your insertion site. Make sure you: ? Wash your hands with soap and water before you change your bandage (dressing). If soap and water are not available, use hand sanitizer. ? Change your dressing as told by your health care provider. ? Leave stitches (sutures), skin glue, or adhesive strips in place. These skin closures may need to stay in place for 2 weeks or longer. If adhesive strip edges start to loosen and curl up, you may trim the loose edges. Do not remove adhesive strips completely unless your health care provider tells you to do that.  Do not take baths, swim, or use a hot tub until your health care provider approves.  You may shower 24-48 hours after the procedure or as told by your health care provider. ? Gently wash the site with plain soap and water. ? Pat the area dry with a clean towel. ? Do not rub the  site. This may cause bleeding.  Do not apply powder or lotion to the site. Keep the site clean and dry.  Check your femoral site every day for signs of infection. Check for: ? Redness, swelling, or pain. ? Fluid or blood. ? Warmth. ? Pus or a bad smell. Activity  For the first 2-3 days after your procedure, or as long as directed: ? Avoid climbing stairs as much as possible. ? Do not squat.  Do not lift anything that is heavier than 10 lb (4.5 kg), or the limit that you are told, until your health care provider says that it is safe.  Rest as directed. ? Avoid sitting for a long time without moving. Get up to take short walks every 1-2 hours.  Do not drive for 24 hours if you were given a medicine to help you relax (sedative). General instructions  Take over-the-counter and prescription medicines only  as told by your health care provider.  Keep all follow-up visits as told by your health care provider. This is important. Contact a health care provider if you have:  A fever or chills.  You have redness, swelling, or pain around your insertion site. Get help right away if:  The catheter insertion area swells very fast.  You pass out.  You suddenly start to sweat or your skin gets clammy.  The catheter insertion area is bleeding, and the bleeding does not stop when you hold steady pressure on the area.  The area near or just beyond the catheter insertion site becomes pale, cool, tingly, or numb. These symptoms may represent a serious problem that is an emergency. Do not wait to see if the symptoms will go away. Get medical help right away. Call your local emergency services (911 in the [place] Do not drive yourself to the hospital. Summary  After the procedure, it is common to have bruising that usually fades within 1-2 weeks.  Check your femoral site every day for signs of infection.  Do not lift anything that is heavier than 10 lb (4.5 kg), or the limit that you are  told, until your health care provider says that it is safe. This information is not intended to replace advice given to you by your health care provider. Make sure you discuss any questions you have with your health care provider. Document Released: 10/08/2013 Document Revised: 02/17/2017 Document Reviewed: 02/17/2017 Elsevier Interactive Patient Education  2019 [employer].

## 2018-02-04 NOTE — Progress Notes (Signed)
No bleeding or hematoma noted after ambulation 

## 2018-02-04 NOTE — Telephone Encounter (Signed)
Ordered labs for NL 12/20

## 2018-02-04 NOTE — Progress Notes (Signed)
Site area: RFA Site Prior to Removal:  Level 0 Pressure Applied For:25 min Manual:   yes Patient Status During Pull:  stable Post Pull Site:  Level 0 Post Pull Instructions Given:  yes Post Pull Pulses Present: palpable Dressing Applied:  clear Bedrest begins @ 1600 till 2000 Comments:

## 2018-02-04 NOTE — Interval H&P Note (Signed)
History and Physical Interval Note:  02/04/2018 1:32 PM  Shannon Carroll  has presented today for cardiac catheterization, with the diagnosis of shortness of breath, mitral stenosis, and aortic stenosis.  The various methods of treatment have been discussed with the patient and family. After consideration of risks, benefits and other options for treatment, the patient has consented to  Procedure(s): RIGHT/LEFT HEART CATH AND CORONARY ANGIOGRAPHY (N/A) as a surgical intervention .  The patient's history has been reviewed, patient examined, no change in status, stable for surgery.  I have reviewed the patient's chart and labs.  Questions were answered to the patient's satisfaction.    Cath Lab Visit (complete for each Cath Lab visit)  Clinical Evaluation Leading to the Procedure:   ACS: No.  Non-ACS:    Anginal/HF Classification: NYHA class IV  Anti-ischemic medical therapy: No Therapy  Non-Invasive Test Results: No non-invasive testing performed  Prior CABG: No previous CABG  Shannon Carroll

## 2018-02-05 ENCOUNTER — Encounter (HOSPITAL_COMMUNITY): Payer: Self-pay | Admitting: Internal Medicine

## 2018-02-05 ENCOUNTER — Telehealth: Payer: Self-pay

## 2018-02-05 NOTE — Telephone Encounter (Signed)
Informed patient that she needs to go on 12/20 (anytime) to [facility] and have labs drawn to evaluate kidney function post cath.  She verbalized understanding.

## 2018-02-06 ENCOUNTER — Other Ambulatory Visit: Payer: Medicare Other

## 2018-02-06 DIAGNOSIS — N189 Chronic kidney disease, unspecified: Secondary | ICD-10-CM | POA: Diagnosis not present

## 2018-02-06 LAB — BASIC METABOLIC PANEL
BUN / CREAT RATIO: 15 (ref 12–28)
BUN: 20 mg/dL (ref 8–27)
CO2: 25 mmol/L (ref 20–29)
Calcium: 9 mg/dL (ref 8.7–10.3)
Chloride: 102 mmol/L (ref 96–106)
Creatinine, Ser: 1.35 mg/dL — ABNORMAL HIGH (ref 0.57–1.00)
GFR calc Af Amer: 47 mL/min/{1.73_m2} — ABNORMAL LOW (ref >59)
GFR calc non Af Amer: 41 mL/min/{1.73_m2} — ABNORMAL LOW (ref >59)
GLUCOSE: 93 mg/dL (ref 65–99)
Potassium: 4.2 mmol/L (ref 3.5–5.2)
Sodium: 142 mmol/L (ref 134–144)

## 2018-02-09 ENCOUNTER — Encounter: Payer: Self-pay | Admitting: *Deleted

## 2018-02-14 ENCOUNTER — Ambulatory Visit
Admission: RE | Admit: 2018-02-14 | Discharge: 2018-02-14 | Disposition: A | Discharge status: Home / Self Care | Payer: Medicare Other | Source: Ambulatory Visit | Attending: Family Medicine | Admitting: Family Medicine

## 2018-02-14 DIAGNOSIS — R413 Other amnesia: Secondary | ICD-10-CM | POA: Diagnosis not present

## 2018-02-14 DIAGNOSIS — R410 Disorientation, unspecified: Secondary | ICD-10-CM | POA: Diagnosis not present

## 2018-02-17 DIAGNOSIS — Z803 Family history of malignant neoplasm of breast: Secondary | ICD-10-CM | POA: Diagnosis not present

## 2018-02-17 DIAGNOSIS — Z1231 Encounter for screening mammogram for malignant neoplasm of breast: Secondary | ICD-10-CM | POA: Diagnosis not present

## 2018-02-20 ENCOUNTER — Encounter: Payer: Self-pay | Admitting: Physician Assistant

## 2018-02-20 ENCOUNTER — Ambulatory Visit (INDEPENDENT_AMBULATORY_CARE_PROVIDER_SITE_OTHER): Payer: Medicare Other | Admitting: Physician Assistant

## 2018-02-20 VITALS — BP 120/78 | HR 95 | Ht 68.0 in | Wt 306.0 lb

## 2018-02-20 DIAGNOSIS — N189 Chronic kidney disease, unspecified: Secondary | ICD-10-CM

## 2018-02-20 DIAGNOSIS — I050 Rheumatic mitral stenosis: Secondary | ICD-10-CM | POA: Diagnosis not present

## 2018-02-20 DIAGNOSIS — I739 Peripheral vascular disease, unspecified: Secondary | ICD-10-CM | POA: Diagnosis not present

## 2018-02-20 DIAGNOSIS — I350 Nonrheumatic aortic (valve) stenosis: Secondary | ICD-10-CM

## 2018-02-20 DIAGNOSIS — I639 Cerebral infarction, unspecified: Secondary | ICD-10-CM | POA: Diagnosis not present

## 2018-02-20 DIAGNOSIS — R0609 Other forms of dyspnea: Secondary | ICD-10-CM

## 2018-02-20 NOTE — Progress Notes (Signed)
Cardiology Office Note   Date:  02/20/2018   ID:  Shannon Carroll, DOB 06/22/1950, MRN [id number]  PCP:  Harlan Stains, MD Cardiologist:  Minus Breeding, MD 01/30/2018 Rosaria Ferries, PA-C   No chief complaint on file.   History of Present Illness: Shannon Carroll is a 68 y.o. female with a history of DM, HTN, HLD, CKD II, morbid obesity, adenomatous colon polyps, arthritis  12/13 office visit, patient with moderate aortic stenosis and mitral stenosis, LVH with diastolic dysfunction on echo.  Complaining of dyspnea on exertion, possible deconditioning, morbid obesity but also concern for CAD>>R/L heart cath 12/18 w/ non-obs dz and mildly elevated filling pressures, recheck BMET at f/u  Shannon Carroll presents for cardiology follow up.   Pt was confused and not thinking straight, was very weak. Dr Dema Severin ordered an MRI and it showed acute or subacute CVA.   Sx have resolved. Pt has been under a lot of stress. Husband just dx w/ Cancer (again), will start XRT soon. Pt is husband's primary caregiver.   No CP or SOB since cath. She has DOE, no recent change. Does not wake w/ LE edema. No PND, chronic orthopnea.   Does not exercise, but will look into programs at [facility].  Her deconditioning is significant, she may benefit from a physical therapy evaluation.  She will discuss this with her PCP.  She understands not to push herself too hard because of her valve.   Past Medical History:  Diagnosis Date  . Adenomatous polyps 12/14   f/u 5 yrs dr Penelope Coop  . Arthritis   . Chronic kidney disease    stage 2  . Depression   . Diabetes mellitus without complication ([place])   . Hirsutism   . Hyperlipidemia   . Hypertension   . Murmur   . Obesity     Past Surgical History:  Procedure Laterality Date  . CESAREAN SECTION  12/23/1980  . COLONOSCOPY  06/2005-09/2007  . DILATION AND CURETTAGE OF UTERUS    . RIGHT/LEFT HEART CATH AND CORONARY ANGIOGRAPHY N/A 02/04/2018   Procedure:  RIGHT/LEFT HEART CATH AND CORONARY ANGIOGRAPHY;  Surgeon: Nelva Bush, MD;  Location: [facility];  Service: Cardiovascular;  Laterality: N/A;  . TONSILLECTOMY AND ADENOIDECTOMY  1956    Current Outpatient Medications  Medication Sig Dispense Refill  . aspirin EC 81 MG tablet Take 81 mg by mouth every evening.    . simvastatin (ZOCOR) 40 MG tablet Take 40 mg by mouth every evening.     . venlafaxine (EFFEXOR) 37.5 MG tablet Take 37.5-112.5 mg by mouth See admin instructions. Take 3 tablets (112.5 mg) by mouth in the morning & take 1 tablet (37.5 mg) by mouth at night.     No current facility-administered medications for this visit.     Allergies:   Lisinopril    Social History:  The patient  reports that she quit smoking about 24 years ago. She has never used smokeless tobacco. She reports current alcohol use. She reports that she does not use drugs.   Family History:  The patient's family history includes CAD in her father; Diabetes in her brother, mother, and sister; Heart attack in her father; Heart failure in her mother; Hypertension in her brother, father, mother, and sister; Skin cancer in her father.  She indicated that her mother is deceased. She indicated that her father is deceased. She indicated that only one of her two sisters is alive. She indicated that  both of her brothers are alive. She indicated that her maternal grandmother is deceased. She indicated that her maternal grandfather is deceased. She indicated that her paternal grandmother is deceased. She indicated that her paternal grandfather is deceased.    ROS:  Please see the history of present illness. All other systems are reviewed and negative.    PHYSICAL EXAM: VS:  BP 120/78   Pulse 95   Ht 5\' 8"  (1.727 m)   Wt (!) 306 lb (138.8 kg)   SpO2 94%   BMI 46.53 kg/m  , BMI Body mass index is 46.53 kg/m. GEN: Well nourished, morbidly obese, female in no acute distress HEENT: normal for age  Neck: no  JVD, no carotid bruit, no masses Cardiac: RRR; soft murmur, no rubs, or gallops Respiratory:  clear to auscultation bilaterally, normal work of breathing GI: soft, nontender, nondistended, + BS MS: no deformity or atrophy; no edema; distal pulses are 2+ in all 4 extremities  Skin: warm and dry, no rash Neuro:  Strength and sensation are intact Psych: euthymic mood, full affect   EKG:  EKG is not ordered today.   ECHO: 01/29/2018 - Procedure narrative: Transthoracic echocardiography. Image   quality was suboptimal. The study was technically difficult.   Intravenous contrast (Definity) was administered to opacify the LV. - Left ventricle: The cavity size was normal. Wall thickness was   increased in a pattern of moderate LVH. Systolic function was   vigorous. The estimated ejection fraction was in the range of 65%   to 70%. Wall motion was normal; there were no regional wall   motion abnormalities. Features are consistent with a pseudonormal   left ventricular filling pattern, with concomitant abnormal   relaxation and increased filling pressure (grade 2 diastolic   dysfunction). - Aortic valve:  The aortic valve is not seen well. There is a gradient measured but this may be due to the dynamic mid LV obstruction and not the AV Suggest TEE if clinically indicated for further evaluation of the AV .  Doppler:     VTI ratio of LVOT to aortic valve: 0.51. Valve area (VTI): 1.76 cm^2. Indexed valve area (VTI): 0.69 cm^2/m^2. Peak velocity ratio of LVOT to aortic valve: 0.45. Valve area (Vmax): 1.55 cm^2. Indexed valve area (Vmax): 0.6 cm^2/m^2. Mean velocity ratio of LVOT to aortic valve: 0.49. Valve area (Vmean): 1.68 cm^2. Indexed valve area (Vmean): 0.66 cm^2/m^2.    Mean gradient (S): 10 mm Hg. Peak gradient (S): 20 mm Hg. - Mitral valve: The findings are consistent with moderate stenosis. - Left atrium: The atrium was moderately dilated.  Impressions:  - Technically difficult  echo with poor image quality  CATH: 02/04/2018 FINDINGS: 1. Mild to moderate, non-obstructive coronary artery disease. 2. Very mild aortic stenosis. 3. Probably moderate mitral stenosis as well as suspected significant mitral regurgitation. 4. Mildly elevated left heart filling pressures. 5. Moderate pulmonary hypertension. 6. Normal Fick cardiac output/index. 7. 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 Collateral filling of the right brachial artery was noted.  RECOMMENDATIONS: 1. Consider TEE for further assessment of mitral valve. 2. Medical therapy of non-obstructive CAD. 3. Check BMP on Friday or Monday (before metformin is restarted). Diagnostic  Dominance: Right     MRI: 02/14/2018 IMPRESSION: 1. Possible punctate acute or subacute left lateral periventricular white matter infarct. 2. Chronic right frontal lobe infarct. 3. Mild-to-moderate chronic small vessel ischemic disease and moderately age advanced cerebral atrophy.  Recent Labs: 01/30/2018: Hemoglobin 11.5; Platelets 275;  TSH 1.620 02/06/2018: BUN 20; Creatinine, Ser 1.35; Potassium 4.2; Sodium 142  CBC    Component Value Date/Time   WBC 10.4 01/30/2018 1357   WBC 9.5 10/09/2006 1125   RBC 3.99 01/30/2018 1357   RBC 4.57 10/09/2006 1125   HGB 11.5 01/30/2018 1357   HCT 34.4 01/30/2018 1357   PLT 275 01/30/2018 1357   MCV 86 01/30/2018 1357   MCH 28.8 01/30/2018 1357   MCHC 33.4 01/30/2018 1357   MCHC 35.4 10/09/2006 1125   RDW 13.1 01/30/2018 1357   CMP Latest Ref Rng & Units 02/06/2018 01/30/2018 10/09/2006  Glucose 65 - 99 mg/dL 93 84 92  BUN 8 - 27 mg/dL 20 30(H) 16  Creatinine 0.57 - 1.00 mg/dL 1.35(H) 1.27(H) 0.72  Sodium 134 - 144 mmol/L 142 144 139  Potassium 3.5 - 5.2 mmol/L 4.2 4.1 3.9  Chloride 96 - 106 mmol/L 102 99 103  CO2 20 - 29 mmol/L 25 29 27   Calcium 8.7 - 10.3 mg/dL 9.0 9.4 9.2     Lipid Panel No results found for: CHOL, HDL, LDLCALC, LDLDIRECT, TRIG,  CHOLHDL    Wt Readings from Last 3 Encounters:  02/20/18 (!) 306 lb (138.8 kg)  02/04/18 297 lb (134.7 kg)  01/30/18 297 lb 6.4 oz (134.9 kg)     Other studies Reviewed: Additional studies/ records that were reviewed today include: Office notes, hospital records and testing.  ASSESSMENT AND PLAN:  1.  Dyspnea on exertion: See cardiac catheterization report above.  Valve issues are not severe, but patient does have some pulmonary hypertension. - No significant volume overload by exam at this time despite increase in weight. -She is encouraged to continue to monitor this.  2.  Moderate mitral stenosis and mild aortic stenosis: On the echo and cath report, TEE is recommended for further evaluation. - I discussed the procedure with the patient, but she does not wish to pursue any additional work-up at this time. - She understands not to push herself too hard, but if her dyspnea on exertion remains significant, she can contact us to to discuss the TEE.  3.  CVA:. -On the MRI, and old CVA was seen as well as an acute/subacute CVA. - Follow-up with Dr. Dema Severin for this.  4.  PAD: - On her cardiac catheterization, she had an occluded right axillary vein - The right brachial filled with collateral circulation she is not having any upper extremity claudication symptoms - Continue current therapy.  5.  Renal insufficiency -Unclear if acute or chronic - Creatinine went from 1.27 up to 1.35 post cath Recheck today   Current medicines are reviewed at length with the patient today.  The patient does not have concerns regarding medicines.  The following changes have been made:  no change  Labs/ tests ordered today include:   Orders Placed This Encounter  Procedures  . Basic metabolic panel     Disposition:   FU with Minus Breeding, MD  Signed, Rosaria Ferries, PA-C  02/20/2018 5:06 PM    [place] Phone: [phone]; Fax: [phone]

## 2018-02-20 NOTE — Patient Instructions (Addendum)
Medication Instructions:  Your physician recommends that you continue on your current medications as directed. Please refer to the Current Medication list given to you today.  If you need a refill on your cardiac medications before your next appointment, please call your pharmacy.   Lab work: TODAY:  BMET  If you have labs (blood work) drawn today and your tests are completely normal, you will receive your results only by: Marland Kitchen MyChart Message (if you have MyChart) OR . A paper copy in the mail If you have any lab test that is abnormal or we need to change your treatment, we will call you to review the results.  Testing/Procedures: None ordered  Follow-Up: Your physician recommends that you schedule a follow-up appointment in: 3 MONTHS WITH DR. HOCHREIN   Any Other Special Instructions Will Be Listed Below (If Applicable). Talk to your primary care physician about a Physical Therapy referral  Call us if you decide about a TEE

## 2018-02-21 LAB — BASIC METABOLIC PANEL
BUN/Creatinine Ratio: 17 (ref 12–28)
BUN: 18 mg/dL (ref 8–27)
CO2: 23 mmol/L (ref 20–29)
Calcium: 9 mg/dL (ref 8.7–10.3)
Chloride: 104 mmol/L (ref 96–106)
Creatinine, Ser: 1.07 mg/dL — ABNORMAL HIGH (ref 0.57–1.00)
GFR calc Af Amer: 62 mL/min/{1.73_m2} (ref >59)
GFR calc non Af Amer: 54 mL/min/{1.73_m2} — ABNORMAL LOW (ref >59)
Glucose: 112 mg/dL — ABNORMAL HIGH (ref 65–99)
POTASSIUM: 4.7 mmol/L (ref 3.5–5.2)
Sodium: 144 mmol/L (ref 134–144)

## 2018-02-23 DIAGNOSIS — M6281 Muscle weakness (generalized): Secondary | ICD-10-CM | POA: Diagnosis not present

## 2018-02-23 DIAGNOSIS — R2681 Unsteadiness on feet: Secondary | ICD-10-CM | POA: Diagnosis not present

## 2018-02-23 DIAGNOSIS — R5383 Other fatigue: Secondary | ICD-10-CM | POA: Diagnosis not present

## 2018-02-23 DIAGNOSIS — I509 Heart failure, unspecified: Secondary | ICD-10-CM | POA: Diagnosis not present

## 2018-02-23 DIAGNOSIS — R262 Difficulty in walking, not elsewhere classified: Secondary | ICD-10-CM | POA: Diagnosis not present

## 2018-02-24 DIAGNOSIS — I509 Heart failure, unspecified: Secondary | ICD-10-CM | POA: Diagnosis not present

## 2018-02-24 DIAGNOSIS — M6281 Muscle weakness (generalized): Secondary | ICD-10-CM | POA: Diagnosis not present

## 2018-02-24 DIAGNOSIS — R262 Difficulty in walking, not elsewhere classified: Secondary | ICD-10-CM | POA: Diagnosis not present

## 2018-02-25 ENCOUNTER — Other Ambulatory Visit: Payer: Self-pay | Admitting: Family Medicine

## 2018-02-25 DIAGNOSIS — M6281 Muscle weakness (generalized): Secondary | ICD-10-CM | POA: Diagnosis not present

## 2018-02-25 DIAGNOSIS — Z8673 Personal history of transient ischemic attack (TIA), and cerebral infarction without residual deficits: Secondary | ICD-10-CM

## 2018-02-25 DIAGNOSIS — R262 Difficulty in walking, not elsewhere classified: Secondary | ICD-10-CM | POA: Diagnosis not present

## 2018-02-25 DIAGNOSIS — I509 Heart failure, unspecified: Secondary | ICD-10-CM | POA: Diagnosis not present

## 2018-02-25 DIAGNOSIS — R5383 Other fatigue: Secondary | ICD-10-CM | POA: Diagnosis not present

## 2018-02-25 DIAGNOSIS — R2681 Unsteadiness on feet: Secondary | ICD-10-CM | POA: Diagnosis not present

## 2018-02-26 DIAGNOSIS — I509 Heart failure, unspecified: Secondary | ICD-10-CM | POA: Diagnosis not present

## 2018-02-26 DIAGNOSIS — R262 Difficulty in walking, not elsewhere classified: Secondary | ICD-10-CM | POA: Diagnosis not present

## 2018-02-26 DIAGNOSIS — M6281 Muscle weakness (generalized): Secondary | ICD-10-CM | POA: Diagnosis not present

## 2018-02-27 DIAGNOSIS — R5383 Other fatigue: Secondary | ICD-10-CM | POA: Diagnosis not present

## 2018-02-27 DIAGNOSIS — M6281 Muscle weakness (generalized): Secondary | ICD-10-CM | POA: Diagnosis not present

## 2018-02-27 DIAGNOSIS — R262 Difficulty in walking, not elsewhere classified: Secondary | ICD-10-CM | POA: Diagnosis not present

## 2018-02-27 DIAGNOSIS — I509 Heart failure, unspecified: Secondary | ICD-10-CM | POA: Diagnosis not present

## 2018-02-27 DIAGNOSIS — R2681 Unsteadiness on feet: Secondary | ICD-10-CM | POA: Diagnosis not present

## 2018-03-02 ENCOUNTER — Encounter: Payer: Self-pay | Admitting: Neurology

## 2018-03-02 ENCOUNTER — Ambulatory Visit (INDEPENDENT_AMBULATORY_CARE_PROVIDER_SITE_OTHER): Payer: Medicare Other | Admitting: Neurology

## 2018-03-02 VITALS — BP 147/68 | HR 79 | Ht 68.0 in | Wt 298.0 lb

## 2018-03-02 DIAGNOSIS — Z79899 Other long term (current) drug therapy: Secondary | ICD-10-CM | POA: Diagnosis not present

## 2018-03-02 DIAGNOSIS — I6381 Other cerebral infarction due to occlusion or stenosis of small artery: Secondary | ICD-10-CM | POA: Diagnosis not present

## 2018-03-02 DIAGNOSIS — M6281 Muscle weakness (generalized): Secondary | ICD-10-CM | POA: Diagnosis not present

## 2018-03-02 DIAGNOSIS — I509 Heart failure, unspecified: Secondary | ICD-10-CM | POA: Diagnosis not present

## 2018-03-02 DIAGNOSIS — R262 Difficulty in walking, not elsewhere classified: Secondary | ICD-10-CM | POA: Diagnosis not present

## 2018-03-02 MED ORDER — CLOPIDOGREL BISULFATE 75 MG PO TABS: 75.0000 mg | ORAL_TABLET | Freq: Every day | ORAL | Status: DC

## 2018-03-02 NOTE — Patient Instructions (Addendum)
I had a long d/w patient and her friend Chipper Herb about his recent lacunar stroke, risk for recurrent stroke/TIAs, personally independently reviewed imaging studies and stroke evaluation results and answered questions.Discontinue aspirin and take Plavix 75 mg daily instead for secondary stroke prevention and maintain strict control of hypertension with blood pressure goal below 130/90, diabetes with hemoglobin A1c goal below 6.5% and lipids with LDL cholesterol goal below 70 mg/dL. I also advised the patient to eat a healthy diet with plenty of whole grains, cereals, fruits and vegetables, exercise regularly and maintain ideal body weight.  Check MRI brain w/wo, MRA of the brain and neck, lipid profile, hemoglobin A1c and urine analysis.  Check polysomnogram for sleep apnea.  Continue to use a wheelchair for ambulation.  I recommend she follow-up with primary care physician for further evaluation and treatment for her shortness of breath.Followup in the future with me in 3 months or call earlier if necessary   Stroke Prevention Some medical conditions and behaviors are associated with a higher chance of having a stroke. You can help prevent a stroke by making nutrition, lifestyle, and other changes, including managing any medical conditions you may have. What nutrition changes can be made?   Eat healthy foods. You can do this by: ? Choosing foods high in fiber, such as fresh fruits and vegetables and whole grains. ? Eating at least 5 or more servings of fruits and vegetables a day. Try to fill half of your plate at each meal with fruits and vegetables. ? Choosing lean protein foods, such as lean cuts of meat, poultry without skin, fish, tofu, beans, and nuts. ? Eating low-fat dairy products. ? Avoiding foods that are high in salt (sodium). This can help lower blood pressure. ? Avoiding foods that have saturated fat, trans fat, and cholesterol. This can help prevent high cholesterol. ? Avoiding  processed and premade foods.  Follow your health care provider's specific guidelines for losing weight, controlling high blood pressure (hypertension), lowering high cholesterol, and managing diabetes. These may include: ? Reducing your daily calorie intake. ? Limiting your daily sodium intake to 1,500 milligrams (mg). ? Using only healthy fats for cooking, such as olive oil, canola oil, or sunflower oil. ? Counting your daily carbohydrate intake. What lifestyle changes can be made?  Maintain a healthy weight. Talk to your health care provider about your ideal weight.  Get at least 30 minutes of moderate physical activity at least 5 days a week. Moderate activity includes brisk walking, biking, and swimming.  Do not use any products that contain nicotine or tobacco, such as cigarettes and e-cigarettes. If you need help quitting, ask your health care provider. It may also be helpful to avoid exposure to secondhand smoke.  Limit alcohol intake to no more than 1 drink a day for nonpregnant women and 2 drinks a day for men. One drink equals 12 oz of beer, 5 oz of wine, or 1 oz of hard liquor.  Stop any illegal drug use.  Avoid taking birth control pills. Talk to your health care provider about the risks of taking birth control pills if: ? You are over 2 years old. ? You smoke. ? You get migraines. ? You have ever had a blood clot. What other changes can be made?  Manage your cholesterol levels. ? Eating a healthy diet is important for preventing high cholesterol. If cholesterol cannot be managed through diet alone, you may also need to take medicines. ? Take any prescribed medicines to  control your cholesterol as told by your health care provider.  Manage your diabetes. ? Eating a healthy diet and exercising regularly are important parts of managing your blood sugar. If your blood sugar cannot be managed through diet and exercise, you may need to take medicines. ? Take any prescribed  medicines to control your diabetes as told by your health care provider.  Control your hypertension. ? To reduce your risk of stroke, try to keep your blood pressure below 130/80. ? Eating a healthy diet and exercising regularly are an important part of controlling your blood pressure. If your blood pressure cannot be managed through diet and exercise, you may need to take medicines. ? Take any prescribed medicines to control hypertension as told by your health care provider. ? Ask your health care provider if you should monitor your blood pressure at home. ? Have your blood pressure checked every year, even if your blood pressure is normal. Blood pressure increases with age and some medical conditions.  Get evaluated for sleep disorders (sleep apnea). Talk to your health care provider about getting a sleep evaluation if you snore a lot or have excessive sleepiness.  Take over-the-counter and prescription medicines only as told by your health care provider. Aspirin or blood thinners (antiplatelets or anticoagulants) may be recommended to reduce your risk of forming blood clots that can lead to stroke.  Make sure that any other medical conditions you have, such as atrial fibrillation or atherosclerosis, are managed. What are the warning signs of a stroke? The warning signs of a stroke can be easily remembered as BEFAST.  B is for balance. Signs include: ? Dizziness. ? Loss of balance or coordination. ? Sudden trouble walking.  E is for eyes. Signs include: ? A sudden change in vision. ? Trouble seeing.  F is for face. Signs include: ? Sudden weakness or numbness of the face. ? The face or eyelid drooping to one side.  A is for arms. Signs include: ? Sudden weakness or numbness of the arm, usually on one side of the body.  S is for speech. Signs include: ? Trouble speaking (aphasia). ? Trouble understanding.  T is for time. ? These symptoms may represent a serious problem that is  an emergency. Do not wait to see if the symptoms will go away. Get medical help right away. Call your local emergency services (911 in the [place] Do not drive yourself to the hospital.  Other signs of stroke may include: ? A sudden, severe headache with no known cause. ? Nausea or vomiting. ? Seizure. Where to find more information For more information, visit:  American Stroke Association: www.strokeassociation.org  National Stroke Association: www.stroke.org Summary  You can prevent a stroke by eating healthy, exercising, not smoking, limiting alcohol intake, and managing any medical conditions you may have.  Do not use any products that contain nicotine or tobacco, such as cigarettes and e-cigarettes. If you need help quitting, ask your health care provider. It may also be helpful to avoid exposure to secondhand smoke.  Remember BEFAST for warning signs of stroke. Get help right away if you or a loved one has any of these signs. This information is not intended to replace advice given to you by your health care provider. Make sure you discuss any questions you have with your health care provider. Document Released: 03/14/2004 Document Revised: 03/12/2016 Document Reviewed: 03/12/2016 Elsevier Interactive Patient Education  2019 [employer].

## 2018-03-02 NOTE — Progress Notes (Signed)
[facility] [address] [place]. [place] [address] [phone]       OFFICE CONSULT NOTE  Shannon Carroll Date of Birth:  1950/10/12 Medical Record Number:  [id number]   Referring MD: Harlan Stains  Reason for Referral: Stroke  HPI: Shannon Carroll is a pleasant 68 year old Caucasian lady seen today for initial office consultation visit for stroke.  She is accompanied by her neighbor Chipper Herb and I spoke to her son [name] over the phone as well.  History is obtained from them and review of provided medical records.  I have personally reviewed imaging films in PACS.  The patient states that around Thanksgiving she became confused and drove from [place] and got lost and went of the road.  She also noticed difficulty remembering things.  Around the same time she also complained of increased shortness of breath as well as incontinence of urine.  She was evaluated by her cardiologist and found to have elevated BMP and likely congestive heart failure.  She started on carvedilol and underwent cardiac catheterization on 02/04/2018 which showed moderate nonobstructive coronary artery disease with 40 to 50% proximal left circumflex stenosis and mild to moderately elevated left heart filling pressures and moderate to severe pulmonary hypertension.  Transthoracic echo on 01/29/2018 showed normal ejection fraction of 65 to 70% without wall motion abnormalities.  MRI scan of the brain was obtained on 02/14/2018 which showed a tiny subacute left periventricular white matter lacunar infarct as well as remote age large right frontal white matter infarct as well.  Lipid profile, A1c and imaging studies of extracranial intracranial vessels were not obtained.  Patient has been taking aspirin daily.  She states her diabetes appears to be under good control with last A1c being less than 6 however she does not check fasting glucose daily.  She is able to walk with a walker but finds it even difficult  to take a few steps as she gets short of breath.  She is currently moved into [facility] assisted living facility where she is getting physical and occupational therapy.  She denies any prior history of strokes TIAs seizures migraines or significant neurological problems.  She is also concerned of about her urinary incontinence.  She was checked by primary physician had a urinalysis a month ago which was unremarkable.  She appears to be at risk for sleep apnea but has not been checked with polysomnogram yet.  She states her short-term memory difficulties are not progressive but have not improved since last November.  ROS:   14 system review of systems is positive for fatigue, shortness of breath, feeling hot, flushing, incontinence, memory loss, confusion, anxiety, depression, too much sleep, decreased energy and all other systems negative  PMH:  Past Medical History:  Diagnosis Date  . Adenomatous polyps 12/14   f/u 5 yrs dr Penelope Coop  . Arthritis   . Chronic kidney disease    stage 2  . Depression   . Diabetes mellitus without complication ([place])   . Hirsutism   . Hyperlipidemia   . Hypertension   . Murmur   . Obesity   . Situational stress   . Stroke [facility])     Social History:  Social History   Socioeconomic History  . Marital status: Married    Spouse name: Not on file  . Number of children: Not on file  . Years of education: Not on file  . Highest education level: Not on file  Occupational History  . Not  on file  Social Needs  . Financial resource strain: Not on file  . Food insecurity:    Worry: Not on file    Inability: Not on file  . Transportation needs:    Medical: Not on file    Non-medical: Not on file  Tobacco Use  . Smoking status: Former Smoker    Last attempt to quit: 07/13/1993    Years since quitting: 24.6  . Smokeless tobacco: Never Used  Substance and Sexual Activity  . Alcohol use: Yes    Comment: 2 glasses per year  . Drug use: No  . Sexual  activity: Not on file  Lifestyle  . Physical activity:    Days per week: Not on file    Minutes per session: Not on file  . Stress: Not on file  Relationships  . Social connections:    Talks on phone: Not on file    Gets together: Not on file    Attends religious service: Not on file    Active member of club or organization: Not on file    Attends meetings of clubs or organizations: Not on file    Relationship status: Not on file  . Intimate partner violence:    Fear of current or ex partner: Not on file    Emotionally abused: Not on file    Physically abused: Not on file    Forced sexual activity: Not on file  Other Topics Concern  . Not on file  Social History Narrative   Pt moved 02/19/2018 to [place].    Medications:   Current Outpatient Medications on File Prior to Visit  Medication Sig Dispense Refill  . METFORMIN HCL PO Take 1,000 mg by mouth 2 (two) times daily.    . simvastatin (ZOCOR) 40 MG tablet Take 40 mg by mouth every evening.     . venlafaxine (EFFEXOR) 37.5 MG tablet Take 37.5-112.5 mg by mouth See admin instructions. Take 3 tablets (112.5 mg) by mouth in the morning & take 1 tablet (37.5 mg) by mouth at night.     No current facility-administered medications on file prior to visit.     Allergies:   Allergies  Allergen Reactions  . Lisinopril Cough    Physical Exam General: Obese middle-aged Caucasian lady seated, in no evident distress Head: head normocephalic and atraumatic.   Neck: supple with no carotid or supraclavicular bruits Cardiovascular: regular rate and rhythm, no murmurs Musculoskeletal: no deformity Skin:  no rash/petichiae Vascular:  Normal pulses all extremities  Neurologic Exam Mental Status: Awake and fully alert. Oriented to place and time. Recent and remote memory poor. Attention span, concentration and fund of knowledge diminished. Mood and affect appropriate.  Diminished recall 2/3.  Clock drawing 4/4.   Able to name 10 animals with 4 legs Cranial Nerves: Fundoscopic exam reveals sharp disc margins. Pupils equal, briskly reactive to light. Extraocular movements full without nystagmus. Visual fields full to confrontation. Hearing intact. Facial sensation intact. Face, tongue, palate moves normally and symmetrically.  Motor: Normal bulk and tone.  Mild bilateral proximal lower extremity weakness and unable to get out of the wheelchair without assistance. Sensory.: intact to touch , pinprick , position and vibratory sensation.  Coordination: Rapid alternating movements normal in all extremities. Finger-to-nose and heel-to-shin performed accurately bilaterally. Gait and Station: Arises out of wheelchair with great difficulty.  Barely able to take a few steps without getting short of breath.   Reflexes: 1+ and symmetric. Toes downgoing.  NIHSS  1 Modified Rankin  3   ASSESSMENT: 68 year old lady with mild cognitive impairment and confusion following likely subacute right parietal infarct and November 2019 as well as tiny lacunar infarct in left subcortical region found on MRI from 02/14/2018.  Vascular risk factors of hypertension, diabetes, hyperlipidemia, congestive heart failure obesity and suspected sleep apnea .  Mild cognitive impairment likely post stroke    PLAN: I had a long d/w patient and her friend Chipper Herb about his recent lacunar stroke, risk for recurrent stroke/TIAs, personally independently reviewed imaging studies and stroke evaluation results and answered questions.Discontinue aspirin and take Plavix 75 mg daily instead for secondary stroke prevention and maintain strict control of hypertension with blood pressure goal below 130/90, diabetes with hemoglobin A1c goal below 6.5% and lipids with LDL cholesterol goal below 70 mg/dL. I also advised the patient to eat a healthy diet with plenty of whole grains, cereals, fruits and vegetables, exercise regularly and maintain ideal body  weight.  Check MRI brain w/wo, MRA of the brain and neck, lipid profile, hemoglobin A1c and urine analysis.  Check polysomnogram for sleep apnea.  Continue to use a wheelchair for ambulation.  I recommend she follow-up with primary care physician for further evaluation and treatment for her shortness of breath greater than 50% time during this 45-minute consultation visit was spent on counseling and coordination of care about stroke and answering questions.Followup in the future with me in 3 months or call earlier if necessary  Antony Contras, MD  [facility] [address] [place], [place] [address]  Phone [phone] Fax [phone] Note: This document was prepared with digital dictation and possible smart phrase technology. Any transcriptional errors that result from this process are unintentional.

## 2018-03-03 ENCOUNTER — Other Ambulatory Visit: Payer: Medicare Other

## 2018-03-03 ENCOUNTER — Telehealth: Payer: Self-pay | Admitting: Neurology

## 2018-03-03 DIAGNOSIS — R2681 Unsteadiness on feet: Secondary | ICD-10-CM | POA: Diagnosis not present

## 2018-03-03 DIAGNOSIS — M6281 Muscle weakness (generalized): Secondary | ICD-10-CM | POA: Diagnosis not present

## 2018-03-03 DIAGNOSIS — I509 Heart failure, unspecified: Secondary | ICD-10-CM | POA: Diagnosis not present

## 2018-03-03 DIAGNOSIS — R5383 Other fatigue: Secondary | ICD-10-CM | POA: Diagnosis not present

## 2018-03-03 DIAGNOSIS — R262 Difficulty in walking, not elsewhere classified: Secondary | ICD-10-CM | POA: Diagnosis not present

## 2018-03-03 NOTE — Telephone Encounter (Signed)
Medicare/aarp order sent to [facility]. No auth they will reach out to the pt to schedule.  °

## 2018-03-03 NOTE — Telephone Encounter (Signed)
lvm for pt to be aware I left GI phone number of [phone] and to give them a call if she has not heard from them in the next 2-3 business days.  °

## 2018-03-04 ENCOUNTER — Other Ambulatory Visit: Payer: Self-pay | Admitting: *Deleted

## 2018-03-04 DIAGNOSIS — M6281 Muscle weakness (generalized): Secondary | ICD-10-CM | POA: Diagnosis not present

## 2018-03-04 DIAGNOSIS — R262 Difficulty in walking, not elsewhere classified: Secondary | ICD-10-CM | POA: Diagnosis not present

## 2018-03-04 DIAGNOSIS — R5383 Other fatigue: Secondary | ICD-10-CM | POA: Diagnosis not present

## 2018-03-04 DIAGNOSIS — R2681 Unsteadiness on feet: Secondary | ICD-10-CM | POA: Diagnosis not present

## 2018-03-04 DIAGNOSIS — I6381 Other cerebral infarction due to occlusion or stenosis of small artery: Secondary | ICD-10-CM | POA: Diagnosis not present

## 2018-03-04 DIAGNOSIS — I509 Heart failure, unspecified: Secondary | ICD-10-CM | POA: Diagnosis not present

## 2018-03-04 NOTE — Addendum Note (Signed)
Addended by: Inis Sizer D on: 03/04/2018 11:22 AM   Modules accepted: Orders

## 2018-03-05 DIAGNOSIS — M6281 Muscle weakness (generalized): Secondary | ICD-10-CM | POA: Diagnosis not present

## 2018-03-05 DIAGNOSIS — R5383 Other fatigue: Secondary | ICD-10-CM | POA: Diagnosis not present

## 2018-03-05 DIAGNOSIS — R2681 Unsteadiness on feet: Secondary | ICD-10-CM | POA: Diagnosis not present

## 2018-03-05 DIAGNOSIS — R262 Difficulty in walking, not elsewhere classified: Secondary | ICD-10-CM | POA: Diagnosis not present

## 2018-03-05 DIAGNOSIS — I509 Heart failure, unspecified: Secondary | ICD-10-CM | POA: Diagnosis not present

## 2018-03-05 LAB — URINALYSIS, ROUTINE W REFLEX MICROSCOPIC
Bilirubin, UA: NEGATIVE
Glucose, UA: NEGATIVE
Ketones, UA: NEGATIVE
Nitrite, UA: POSITIVE — AB
Specific Gravity, UA: 1.027 (ref 1.005–1.030)
Urobilinogen, Ur: 0.2 mg/dL (ref 0.2–1.0)
pH, UA: 5.5 (ref 5.0–7.5)

## 2018-03-05 LAB — MICROSCOPIC EXAMINATION
Casts: NONE SEEN /lpf
Epithelial Cells (non renal): >10 /hpf — AB (ref 0–10)
RBC, UA: >30 /hpf — AB (ref 0–2)
WBC, UA: >30 /hpf — AB (ref 0–5)

## 2018-03-06 ENCOUNTER — Telehealth: Payer: Self-pay

## 2018-03-06 DIAGNOSIS — R262 Difficulty in walking, not elsewhere classified: Secondary | ICD-10-CM | POA: Diagnosis not present

## 2018-03-06 DIAGNOSIS — M6281 Muscle weakness (generalized): Secondary | ICD-10-CM | POA: Diagnosis not present

## 2018-03-06 DIAGNOSIS — I509 Heart failure, unspecified: Secondary | ICD-10-CM | POA: Diagnosis not present

## 2018-03-06 NOTE — Telephone Encounter (Signed)
-----   Message from Garvin Fila, MD sent at 03/06/2018  6:40 AM EST ----- Kindly advise patient that urine test suggests urinary tract infection and she needs to call her primary Dr Harlan Stains for treatment

## 2018-03-06 NOTE — Telephone Encounter (Signed)
I receive a call from Shannon Carroll med tech at [employer]. I explain pts recent labs showed a UTI,and Dr. Leonie Man wants the primary doctor to treat and manage. Shannon Carroll recommend we fax the labs to [phone].

## 2018-03-06 NOTE — Telephone Encounter (Signed)
I call Shannon Carroll med tech at [facility] she receive the fax.Shannon Carroll stated she fax the urinalysis report to patients primary doctor for review to manage her UTI.

## 2018-03-06 NOTE — Telephone Encounter (Signed)
LAbs fax to [phone] twice and confirmed, so MD or NP at [facility] can treat UTI for patient.

## 2018-03-06 NOTE — Telephone Encounter (Signed)
Notes recorded by Marval Regal, RN on 03/06/2018 at 9:14 AM EST Left vm for patent to call back about her abnormal urine results.

## 2018-03-09 ENCOUNTER — Other Ambulatory Visit: Payer: Medicare Other

## 2018-03-09 DIAGNOSIS — M6281 Muscle weakness (generalized): Secondary | ICD-10-CM | POA: Diagnosis not present

## 2018-03-09 DIAGNOSIS — I509 Heart failure, unspecified: Secondary | ICD-10-CM | POA: Diagnosis not present

## 2018-03-09 DIAGNOSIS — R262 Difficulty in walking, not elsewhere classified: Secondary | ICD-10-CM | POA: Diagnosis not present

## 2018-03-10 DIAGNOSIS — R262 Difficulty in walking, not elsewhere classified: Secondary | ICD-10-CM | POA: Diagnosis not present

## 2018-03-10 DIAGNOSIS — M6281 Muscle weakness (generalized): Secondary | ICD-10-CM | POA: Diagnosis not present

## 2018-03-10 DIAGNOSIS — N390 Urinary tract infection, site not specified: Secondary | ICD-10-CM | POA: Diagnosis not present

## 2018-03-10 DIAGNOSIS — I509 Heart failure, unspecified: Secondary | ICD-10-CM | POA: Diagnosis not present

## 2018-03-10 DIAGNOSIS — F331 Major depressive disorder, recurrent, moderate: Secondary | ICD-10-CM | POA: Diagnosis not present

## 2018-03-10 DIAGNOSIS — R41841 Cognitive communication deficit: Secondary | ICD-10-CM | POA: Diagnosis not present

## 2018-03-11 DIAGNOSIS — I509 Heart failure, unspecified: Secondary | ICD-10-CM | POA: Diagnosis not present

## 2018-03-11 DIAGNOSIS — R262 Difficulty in walking, not elsewhere classified: Secondary | ICD-10-CM | POA: Diagnosis not present

## 2018-03-11 DIAGNOSIS — M6281 Muscle weakness (generalized): Secondary | ICD-10-CM | POA: Diagnosis not present

## 2018-03-11 DIAGNOSIS — R2681 Unsteadiness on feet: Secondary | ICD-10-CM | POA: Diagnosis not present

## 2018-03-11 DIAGNOSIS — R5383 Other fatigue: Secondary | ICD-10-CM | POA: Diagnosis not present

## 2018-03-11 LAB — LIPID PANEL
CHOLESTEROL TOTAL: 153 mg/dL (ref 100–199)
Chol/HDL Ratio: 2.5 ratio (ref 0.0–4.4)
HDL: 62 mg/dL (ref >39)
LDL Calculated: 76 mg/dL (ref 0–99)
Triglycerides: 75 mg/dL (ref 0–149)
VLDL Cholesterol Cal: 15 mg/dL (ref 5–40)

## 2018-03-11 LAB — URINALYSIS, ROUTINE W REFLEX MICROSCOPIC

## 2018-03-11 LAB — HEMOGLOBIN A1C
Est. average glucose Bld gHb Est-mCnc: 103 mg/dL
Hgb A1c MFr Bld: 5.2 % (ref 4.8–5.6)

## 2018-03-12 ENCOUNTER — Ambulatory Visit
Admission: RE | Admit: 2018-03-12 | Discharge: 2018-03-12 | Disposition: A | Discharge status: Home / Self Care | Payer: Medicare Other | Source: Ambulatory Visit | Attending: Family Medicine | Admitting: Family Medicine

## 2018-03-12 DIAGNOSIS — I509 Heart failure, unspecified: Secondary | ICD-10-CM | POA: Diagnosis not present

## 2018-03-12 DIAGNOSIS — R262 Difficulty in walking, not elsewhere classified: Secondary | ICD-10-CM | POA: Diagnosis not present

## 2018-03-12 DIAGNOSIS — R5383 Other fatigue: Secondary | ICD-10-CM | POA: Diagnosis not present

## 2018-03-12 DIAGNOSIS — R41841 Cognitive communication deficit: Secondary | ICD-10-CM | POA: Diagnosis not present

## 2018-03-12 DIAGNOSIS — Z8673 Personal history of transient ischemic attack (TIA), and cerebral infarction without residual deficits: Secondary | ICD-10-CM

## 2018-03-12 DIAGNOSIS — F331 Major depressive disorder, recurrent, moderate: Secondary | ICD-10-CM | POA: Diagnosis not present

## 2018-03-12 DIAGNOSIS — I6523 Occlusion and stenosis of bilateral carotid arteries: Secondary | ICD-10-CM | POA: Diagnosis not present

## 2018-03-12 DIAGNOSIS — M6281 Muscle weakness (generalized): Secondary | ICD-10-CM | POA: Diagnosis not present

## 2018-03-12 DIAGNOSIS — N390 Urinary tract infection, site not specified: Secondary | ICD-10-CM | POA: Diagnosis not present

## 2018-03-12 DIAGNOSIS — R2681 Unsteadiness on feet: Secondary | ICD-10-CM | POA: Diagnosis not present

## 2018-03-13 DIAGNOSIS — R2681 Unsteadiness on feet: Secondary | ICD-10-CM | POA: Diagnosis not present

## 2018-03-13 DIAGNOSIS — R5383 Other fatigue: Secondary | ICD-10-CM | POA: Diagnosis not present

## 2018-03-13 DIAGNOSIS — M6281 Muscle weakness (generalized): Secondary | ICD-10-CM | POA: Diagnosis not present

## 2018-03-13 DIAGNOSIS — I509 Heart failure, unspecified: Secondary | ICD-10-CM | POA: Diagnosis not present

## 2018-03-13 DIAGNOSIS — R262 Difficulty in walking, not elsewhere classified: Secondary | ICD-10-CM | POA: Diagnosis not present

## 2018-03-14 DIAGNOSIS — R41841 Cognitive communication deficit: Secondary | ICD-10-CM | POA: Diagnosis not present

## 2018-03-14 DIAGNOSIS — N390 Urinary tract infection, site not specified: Secondary | ICD-10-CM | POA: Diagnosis not present

## 2018-03-14 DIAGNOSIS — F331 Major depressive disorder, recurrent, moderate: Secondary | ICD-10-CM | POA: Diagnosis not present

## 2018-03-16 DIAGNOSIS — R41841 Cognitive communication deficit: Secondary | ICD-10-CM | POA: Diagnosis not present

## 2018-03-16 DIAGNOSIS — R262 Difficulty in walking, not elsewhere classified: Secondary | ICD-10-CM | POA: Diagnosis not present

## 2018-03-16 DIAGNOSIS — N390 Urinary tract infection, site not specified: Secondary | ICD-10-CM | POA: Diagnosis not present

## 2018-03-16 DIAGNOSIS — R2681 Unsteadiness on feet: Secondary | ICD-10-CM | POA: Diagnosis not present

## 2018-03-16 DIAGNOSIS — R5383 Other fatigue: Secondary | ICD-10-CM | POA: Diagnosis not present

## 2018-03-16 DIAGNOSIS — M6281 Muscle weakness (generalized): Secondary | ICD-10-CM | POA: Diagnosis not present

## 2018-03-16 DIAGNOSIS — I509 Heart failure, unspecified: Secondary | ICD-10-CM | POA: Diagnosis not present

## 2018-03-16 DIAGNOSIS — F331 Major depressive disorder, recurrent, moderate: Secondary | ICD-10-CM | POA: Diagnosis not present

## 2018-03-20 DIAGNOSIS — Z7984 Long term (current) use of oral hypoglycemic drugs: Secondary | ICD-10-CM | POA: Diagnosis not present

## 2018-03-20 DIAGNOSIS — Z9181 History of falling: Secondary | ICD-10-CM | POA: Diagnosis not present

## 2018-03-20 DIAGNOSIS — G3184 Mild cognitive impairment, so stated: Secondary | ICD-10-CM | POA: Diagnosis not present

## 2018-03-20 DIAGNOSIS — I10 Essential (primary) hypertension: Secondary | ICD-10-CM | POA: Diagnosis not present

## 2018-03-20 DIAGNOSIS — I110 Hypertensive heart disease with heart failure: Secondary | ICD-10-CM | POA: Diagnosis not present

## 2018-03-20 DIAGNOSIS — S81802D Unspecified open wound, left lower leg, subsequent encounter: Secondary | ICD-10-CM | POA: Diagnosis not present

## 2018-03-20 DIAGNOSIS — Z6841 Body Mass Index (BMI) 40.0 and over, adult: Secondary | ICD-10-CM | POA: Diagnosis not present

## 2018-03-20 DIAGNOSIS — I509 Heart failure, unspecified: Secondary | ICD-10-CM | POA: Diagnosis not present

## 2018-03-20 DIAGNOSIS — E119 Type 2 diabetes mellitus without complications: Secondary | ICD-10-CM | POA: Diagnosis not present

## 2018-03-20 DIAGNOSIS — F329 Major depressive disorder, single episode, unspecified: Secondary | ICD-10-CM | POA: Diagnosis not present

## 2018-03-23 ENCOUNTER — Encounter: Payer: Self-pay | Admitting: Neurology

## 2018-03-23 ENCOUNTER — Ambulatory Visit (INDEPENDENT_AMBULATORY_CARE_PROVIDER_SITE_OTHER): Payer: Medicare Other | Admitting: Neurology

## 2018-03-23 ENCOUNTER — Telehealth: Payer: Self-pay | Admitting: Neurology

## 2018-03-23 VITALS — BP 143/84 | HR 102

## 2018-03-23 DIAGNOSIS — G4719 Other hypersomnia: Secondary | ICD-10-CM

## 2018-03-23 DIAGNOSIS — I6381 Other cerebral infarction due to occlusion or stenosis of small artery: Secondary | ICD-10-CM

## 2018-03-23 DIAGNOSIS — R600 Localized edema: Secondary | ICD-10-CM | POA: Diagnosis not present

## 2018-03-23 DIAGNOSIS — R0683 Snoring: Secondary | ICD-10-CM

## 2018-03-23 DIAGNOSIS — Z6841 Body Mass Index (BMI) 40.0 and over, adult: Secondary | ICD-10-CM | POA: Diagnosis not present

## 2018-03-23 NOTE — Telephone Encounter (Signed)
I would like to defer to Dr. Leonie Man for which med he would feel safe to her to use.

## 2018-03-23 NOTE — Telephone Encounter (Signed)
Ativan 0.5 mg x 1 and may repeat x 1 if needed. Pt should not drive after MRI

## 2018-03-23 NOTE — Telephone Encounter (Addendum)
Pt requesting medication to help keep her calm during MRI/MRA [facility] [address].

## 2018-03-23 NOTE — Patient Instructions (Signed)

## 2018-03-23 NOTE — Progress Notes (Signed)
Subjective:    Patient ID: Shannon Carroll is a 68 y.o. female.  HPI     Star Age, MD, PhD [facility] [address] [place], [place] [address]  Dear Mamie Nick,   I saw your patient, Shannon Carroll, upon your kind request, in my sleep clinic today for initial consultation of her sleep disorder, in particular, concern for underlying obstructive sleep apnea. The patient is accompanied by her friend today (she is also on the [facility]). As you know, Shannon Carroll is a 68 year old right-handed woman with an underlying medical history of arthritis, chronic kidney disease, depression, hypertension, hyperlipidemia, lacunar stroke in 01/2018, and morbid obesity with a BMI of over 45, who reports excessive daytime somnolence. Her friend has noticed that patient will be asleep during the day. Her friend checks on her on a daily basis. Patient moved with her husband into assisted living at [facility] in December 2019. I reviewed your office note from 03/02/2018. Her Epworth sleepiness score is 6 out of 24, fatigue score is 53 out of 63. She quit smoking over 20 years ago, drinks alcohol rarely, drinks caffeine in the form of coffee about 2 per day. Bedtime is around 10:30 and rise time around 6. She does not report night to night nocturia or recurrent morning headaches. She has been using a wheelchair. She has had lower extremity swelling of unclear etiology. She has seen cardiology and had a heart catheterization in December 2019. She is not yet on Plavix, apparently she was given a paper prescription and handed into [facility] but she is not on it currently. They are going to check when they get back to [facility]. She is not sure if she snores.  His Past Medical History Is Significant For: Past Medical History:  Diagnosis Date  . Adenomatous polyps 12/14   f/u 5 yrs dr Penelope Coop  . Arthritis   . Chronic kidney disease    stage 2  . Depression   . Diabetes mellitus  without complication ([place])   . Hirsutism   . Hyperlipidemia   . Hypertension   . Murmur   . Obesity   . Situational stress   . Stroke [facility])     His Past Surgical History Is Significant For: Past Surgical History:  Procedure Laterality Date  . CESAREAN SECTION  12/23/1980  . COLONOSCOPY  06/2005-09/2007  . DILATION AND CURETTAGE OF UTERUS    . RIGHT/LEFT HEART CATH AND CORONARY ANGIOGRAPHY N/A 02/04/2018   Procedure: RIGHT/LEFT HEART CATH AND CORONARY ANGIOGRAPHY;  Surgeon: Nelva Bush, MD;  Location: [facility];  Service: Cardiovascular;  Laterality: N/A;  . TONSILLECTOMY AND ADENOIDECTOMY  1956    His Family History Is Significant For: Family History  Problem Relation Age of Onset  . Heart failure Mother   . Hypertension Mother   . Diabetes Mother   . Heart attack Father   . Skin cancer Father   . Hypertension Father   . CAD Father   . Hypertension Sister   . Diabetes Sister   . Hypertension Brother   . Diabetes Brother     Her Social History Is Significant For: Social History   Socioeconomic History  . Marital status: Married    Spouse name: Not on file  . Number of children: Not on file  . Years of education: Not on file  . Highest education level: Not on file  Occupational History  . Not on file  Social Needs  .  Financial resource strain: Not on file  . Food insecurity:    Worry: Not on file    Inability: Not on file  . Transportation needs:    Medical: Not on file    Non-medical: Not on file  Tobacco Use  . Smoking status: Former Smoker    Last attempt to quit: 07/13/1993    Years since quitting: 24.7  . Smokeless tobacco: Never Used  Substance and Sexual Activity  . Alcohol use: Yes    Comment: 2 glasses per year  . Drug use: No  . Sexual activity: Not on file  Lifestyle  . Physical activity:    Days per week: Not on file    Minutes per session: Not on file  . Stress: Not on file  Relationships  . Social connections:    Talks on  phone: Not on file    Gets together: Not on file    Attends religious service: Not on file    Active member of club or organization: Not on file    Attends meetings of clubs or organizations: Not on file    Relationship status: Not on file  Other Topics Concern  . Not on file  Social History Narrative   Pt moved 02/19/2018 to [place].    Her Allergies Are:  Allergies  Allergen Reactions  . Lisinopril Cough  :   Her Current Medications Are:  Outpatient Encounter Medications as of 03/23/2018  Medication Sig  . METFORMIN HCL PO Take 1,000 mg by mouth 2 (two) times daily.  . Multiple Vitamins-Minerals (CENTRUM SILVER PO) Take by mouth.  . simvastatin (ZOCOR) 40 MG tablet Take 40 mg by mouth every evening.   . valsartan-hydrochlorothiazide (DIOVAN-HCT) 320-25 MG tablet Take 1 tablet by mouth daily.  Marland Kitchen venlafaxine (EFFEXOR) 37.5 MG tablet Take 37.5-112.5 mg by mouth See admin instructions. Take 3 tablets (112.5 mg) by mouth in the morning & take 1 tablet (37.5 mg) by mouth at night.   Facility-Administered Encounter Medications as of 03/23/2018  Medication  . clopidogrel (PLAVIX) tablet 75 mg  :  Review of Systems:  Out of a complete 14 point review of systems, all are reviewed and negative with the exception of these symptoms as listed below: Review of Systems  Neurological:       Pt presents today to discuss her sleep. Pt has never had a sleep study and is unsure if she snores.  Epworth Sleepiness Scale 0= would never doze 1= slight chance of dozing 2= moderate chance of dozing 3= high chance of dozing  Sitting and reading: 1 Watching TV: 1 Sitting inactive in a public place (ex. Theater or meeting): 0 As a passenger in a car for an hour without a break: 1 Lying down to rest in the afternoon: 1 Sitting and talking to someone: 1 Sitting quietly after lunch (no alcohol): 1 In a car, while stopped in traffic: 0 Total: 6     Objective:   Neurological Exam  Physical Exam Physical Examination:   Vitals:   03/23/18 1111  BP: (!) 143/84  Pulse: (!) 102    General Examination: The patient is a very pleasant 68 y.o. female in no acute distress. She is sitting in her wheelchair. She is adequately groomed.  HEENT: Normocephalic, atraumatic, pupils are equal, round and reactive to light and accommodation. Extraocular tracking is good without limitation to gaze excursion or nystagmus noted. Normal smooth pursuit is noted. Hearing is grossly intact. Face is symmetric with  normal facial animation and normal facial sensation. Speech is clear with no dysarthria noted. There is no hypophonia. There is no lip, neck/head, jaw or voice tremor. Neck is supple with full range of passive and active motion. There are no carotid bruits on auscultation. Oropharynx exam reveals: moderate mouth dryness, adequate dental hygiene and marked airway crowding, due to Redundant soft palate, Mallampati is class III, tonsils are absent. Neck circumference is 18-1/4 inches. She has a minimal overbite. Tongue protrudes centrally and palate elevates symmetrically.  Chest: Clear to auscultation without wheezing, rhonchi or crackles noted.  Heart: S1+S2+0, regular and normal without murmurs, rubs or gallops noted.   Abdomen: Soft, non-tender and non-distended with normal bowel sounds appreciated on auscultation.  Extremities: There is 2+ pitting edema in the distal lower extremities bilaterally. Lichenification of skin noted.   Skin: Warm and dry without trophic changes noted.  Musculoskeletal: exam reveals no obvious joint deformities, tenderness or joint swelling or erythema.   Neurologically:  Mental status: The patient is awake, alert and oriented in all 4 spheres. Her immediate and remote memory, attention, language skills and fund of knowledge are appropriate. There is no evidence of aphasia, agnosia, apraxia or anomia. Speech is clear with normal  prosody and enunciation. Thought process is linear. Mood is normal and affect is normal.  Cranial nerves II - XII are as described above under HEENT exam. In addition: shoulder shrug is normal with equal shoulder height noted. Motor exam: Normal bulk, global strength of about 4 out of 5 is noted, no tremor at rest or with posture.I did not have her stand or walk for me is there is no walker available.  Cerebellar testing: No dysmetria or intention tremor.  Sensory exam: intact to light touch.    Assessment and Plan:  In summary, Shannon Carroll is a very pleasant 68 y.o.-year old female with an underlying medical history of arthritis, chronic kidney disease, depression, hypertension, hyperlipidemia, lacunar stroke in 01/2018, and morbid obesity with a BMI of over 61, whose history and physical exam are concerning for obstructive sleep apnea (OSA). I had a long chat with the patient and her friend about my findings and the diagnosis of OSA, its prognosis and treatment options. We talked about medical treatments, surgical interventions and non-pharmacological approaches. I explained in particular the risks and ramifications of untreated moderate to severe OSA, especially with respect to developing cardiovascular disease down the Road, including congestive heart failure, difficult to treat hypertension, cardiac arrhythmias, or stroke. Even type 2 diabetes has, in part, been linked to untreated OSA. Symptoms of untreated OSA include daytime sleepiness, memory problems, mood irritability and mood disorder such as depression and anxiety, lack of energy, as well as recurrent headaches, especially morning headaches. We talked about trying to maintain a healthy lifestyle in general, as well as the importance of weight control. I encouraged the patient to eat healthy, exercise daily and keep well hydrated, to keep a scheduled bedtime and wake time routine, to not skip any meals and eat healthy snacks in between meals.  I advised the patient not to drive when feeling sleepy. I recommended the following at this time: sleep study with potential positive airway pressure titration. (We will score hypopneas at 4%).   I explained the sleep test procedure to the patient and also outlined possible surgical and non-surgical treatment options of OSA, including the use of a custom-made dental device (which would require a referral to a specialist dentist or oral surgeon), upper airway  surgical options, such as pillar implants, radiofrequency surgery, tongue base surgery, and UPPP (which would involve a referral to an ENT surgeon). Rarely, jaw surgery such as mandibular advancement may be considered.  I also explained the CPAP treatment option to the patient, who indicated that she would be willing to try CPAP if the need arises. I explained the importance of being compliant with PAP treatment, not only for insurance purposes but primarily to improve Her symptoms, and for the patient's long term health benefit, including to reduce Her cardiovascular risks. I answered all their questions today and the patient was in agreement. I plan to see her back after the sleep study is completed and encouraged her to call with any interim questions, concerns, problems or updates.   Thank you very much for allowing me to participate in the care of this nice patient. If I can be of any further assistance to you please do not hesitate to call me at [phone].  Sincerely,   Star Age, MD, PhD

## 2018-03-24 ENCOUNTER — Other Ambulatory Visit: Payer: Self-pay

## 2018-03-24 ENCOUNTER — Encounter: Payer: Self-pay | Admitting: Podiatry

## 2018-03-24 ENCOUNTER — Telehealth: Payer: Self-pay

## 2018-03-24 ENCOUNTER — Telehealth: Payer: Self-pay | Admitting: Neurology

## 2018-03-24 ENCOUNTER — Ambulatory Visit (INDEPENDENT_AMBULATORY_CARE_PROVIDER_SITE_OTHER): Payer: Medicare Other | Admitting: Podiatry

## 2018-03-24 ENCOUNTER — Other Ambulatory Visit: Payer: Self-pay | Admitting: Adult Health

## 2018-03-24 DIAGNOSIS — D689 Coagulation defect, unspecified: Secondary | ICD-10-CM

## 2018-03-24 DIAGNOSIS — M79676 Pain in unspecified toe(s): Secondary | ICD-10-CM | POA: Diagnosis not present

## 2018-03-24 DIAGNOSIS — E1149 Type 2 diabetes mellitus with other diabetic neurological complication: Secondary | ICD-10-CM

## 2018-03-24 DIAGNOSIS — B351 Tinea unguium: Secondary | ICD-10-CM | POA: Diagnosis not present

## 2018-03-24 MED ORDER — CLOPIDOGREL BISULFATE 75 MG PO TABS: 75.0000 mg | ORAL_TABLET | Freq: Every day | ORAL | 0 refills | Status: DC

## 2018-03-24 MED ORDER — CLOPIDOGREL BISULFATE 75 MG PO TABS: 75.0000 mg | ORAL_TABLET | Freq: Every day | ORAL | 1 refills | Status: AC

## 2018-03-24 MED ORDER — LORAZEPAM 0.5 MG PO TABS: ORAL_TABLET | ORAL | 0 refills | Status: DC

## 2018-03-24 NOTE — Telephone Encounter (Signed)
Ativan 0.5 mg tab prior to MRI with instructions of repeating x1 [facility]

## 2018-03-24 NOTE — Telephone Encounter (Signed)
I cannot called Deb who is not on dpr. I will called [facility] facility about plavix refills.

## 2018-03-24 NOTE — Telephone Encounter (Signed)
Brittney return nurse phone call. She reported speaking with son [name] about flagging her chart. She stated [name] recommend changing the appt back to April 2020 as recommend by the MD. I cancel appt in February 25,2020 with Dr .Leonie Man. Pt was r/s back to April 2020. Brittney stated [name] will be present at next appt in April 2020 with pt at [facility].He wants his mother chart flag where she cannot be cancelling appts or rescheduling pts until at our office. He will update the DPR form in April 2020 to put more people on the form. I gave Brittney the new appt for April 20,2020 at 230pm check in at [place] will call son with new appt.

## 2018-03-24 NOTE — Telephone Encounter (Signed)
I called [facility] at [phone] and spoke with [place]. I stated pt and her friend are calling about medication refills and having something for her MRI to relax her.Brittney stated pts [facility] because pt is resident at [facility]. I stated [facility] and to note pt only had two pills for her scans next week. I also reported pt called on 03/23/2018 wishing to be seen sooner and was schedule 04/14/2018.  I stated pt had an appt in April 2020 for 3 month follow up. Brittney stated the pt is not having any new neuro symptoms or progressive issues since her visit with [place] in January 2020.I stated pt did reported some short term memory issues. Brittney stated she will call her son [name] about r/s back to April 2020 as recommend by Dr.Sethi.

## 2018-03-24 NOTE — Progress Notes (Signed)
Complaint:  Visit Type: Patient returns to my office for continued preventative foot care services. Complaint: Patient states" my nails have grown long and thick and become painful to walk and wear shoes" Patient has been diagnosed with DM with no foot complications. The patient presents for preventative foot care services. Patient has had  2 CVAs and is taking plavix.  Podiatric Exam: Vascular: dorsalis pedis and posterior tibial pulses are palpable bilateral. Capillary return is immediate. Temperature gradient is WNL. Skin turgor WNL  Sensorium: Normal Semmes Weinstein monofilament test. Normal tactile sensation bilaterally. Nail Exam: Pt has thick disfigured discolored nails with subungual debris noted bilateral entire nail hallux through fifth toenails.  Pincer nails  B/L. Ulcer Exam: There is no evidence of ulcer or pre-ulcerative changes or infection. Orthopedic Exam: Muscle tone and strength are WNL. No limitations in general ROM. No crepitus or effusions noted. HAV 1st MPJ  B/L  DJD 1st IPJ  B/L Skin: No Porokeratosis. No infection or ulcers.    Diagnosis:  Onychomycosis, , Pain in right toe, pain in left toes  Treatment & Plan Procedures and Treatment: Consent by patient was obtained for treatment procedures.   Debridement of mycotic and hypertrophic toenails, 1 through 5 bilateral and clearing of subungual debris. No ulceration, no infection noted.  Return Visit-Office Procedure: Patient instructed to return to the office for a follow up visit 3 months for continued evaluation and treatment.    Gardiner Barefoot DPM

## 2018-03-24 NOTE — Telephone Encounter (Signed)
Deb Cimbala(person came to appointment w/pt. not on recent  DPR) has called to inform that she told Dr Rexene Alberts she would call back re: pt's clopidogrel (PLAVIX) tablet 75 mg. Vickie Epley has called back to inform that [facility] has filled the Plavix for once daily by mouth in the morning.  Deb is not asking for a call back but provided her # to be [phone] if she needs to be called.  She is aware that because she is not on latest DPR it is not likely that she will be called.

## 2018-03-24 NOTE — Telephone Encounter (Signed)
Revised. 

## 2018-03-24 NOTE — Telephone Encounter (Signed)
I called patient that the ativan was sent to [facility] [address]. I stated to pt she cannot drive while taking this medication. I advised pt to have her husband or family friend to drive her to the testing site. I explained that the ativan will cause drowsiness or dizzy spells. I also explained to pt to take one pill 30 minutes prior to procedure, and take an additional one if needed. The pt verbalized understanding, and knows to have a driver to and from the MRI site.

## 2018-03-24 NOTE — Telephone Encounter (Signed)
Refill sent per Dr.SEthi note for 90 days.

## 2018-03-24 NOTE — Telephone Encounter (Signed)
Late entry I tried to call pts home number and cell number it went straight to voice mail.  Patients friend Shannon Carroll called about plavix need to refilled. I called pts son Shannon Carroll on dpr because Shannon Carroll is not on the form.I called pts son Shannon Carroll about pts friend is calling about her meds Also I stated pt called on 03/23/2018 and made a sooner appt with Dr. Leonie Man on 04/14/2018. I explained per Dr. Leonie Man note and via telephone call pt appt should be in April 2020 as stated on his last note.I also stated pt did not report to the scheduler if she was having any issues or need to be seen sooner. I ask if pt was having some cognitive issues at times. Shannon Carroll reported no one from [facility] assisted living stated pt was having new neuro symptoms or needed to be seen sooner. He recommend I called [facility] of what pt is doing.

## 2018-03-25 DIAGNOSIS — I509 Heart failure, unspecified: Secondary | ICD-10-CM | POA: Diagnosis not present

## 2018-03-25 DIAGNOSIS — E119 Type 2 diabetes mellitus without complications: Secondary | ICD-10-CM | POA: Diagnosis not present

## 2018-03-25 DIAGNOSIS — I110 Hypertensive heart disease with heart failure: Secondary | ICD-10-CM | POA: Diagnosis not present

## 2018-03-25 DIAGNOSIS — F329 Major depressive disorder, single episode, unspecified: Secondary | ICD-10-CM | POA: Diagnosis not present

## 2018-03-25 DIAGNOSIS — S81802D Unspecified open wound, left lower leg, subsequent encounter: Secondary | ICD-10-CM | POA: Diagnosis not present

## 2018-03-26 DIAGNOSIS — Z8673 Personal history of transient ischemic attack (TIA), and cerebral infarction without residual deficits: Secondary | ICD-10-CM | POA: Diagnosis not present

## 2018-03-26 DIAGNOSIS — I129 Hypertensive chronic kidney disease with stage 1 through stage 4 chronic kidney disease, or unspecified chronic kidney disease: Secondary | ICD-10-CM | POA: Diagnosis not present

## 2018-03-26 DIAGNOSIS — E1121 Type 2 diabetes mellitus with diabetic nephropathy: Secondary | ICD-10-CM | POA: Diagnosis not present

## 2018-03-26 DIAGNOSIS — N183 Chronic kidney disease, stage 3 (moderate): Secondary | ICD-10-CM | POA: Diagnosis not present

## 2018-03-26 DIAGNOSIS — I110 Hypertensive heart disease with heart failure: Secondary | ICD-10-CM | POA: Diagnosis not present

## 2018-03-26 DIAGNOSIS — D649 Anemia, unspecified: Secondary | ICD-10-CM | POA: Diagnosis not present

## 2018-03-26 DIAGNOSIS — E119 Type 2 diabetes mellitus without complications: Secondary | ICD-10-CM | POA: Diagnosis not present

## 2018-03-26 DIAGNOSIS — Z23 Encounter for immunization: Secondary | ICD-10-CM | POA: Diagnosis not present

## 2018-03-26 DIAGNOSIS — I509 Heart failure, unspecified: Secondary | ICD-10-CM | POA: Diagnosis not present

## 2018-03-26 DIAGNOSIS — Z0000 Encounter for general adult medical examination without abnormal findings: Secondary | ICD-10-CM | POA: Diagnosis not present

## 2018-03-26 DIAGNOSIS — S81802D Unspecified open wound, left lower leg, subsequent encounter: Secondary | ICD-10-CM | POA: Diagnosis not present

## 2018-03-26 DIAGNOSIS — R609 Edema, unspecified: Secondary | ICD-10-CM | POA: Diagnosis not present

## 2018-03-26 DIAGNOSIS — E785 Hyperlipidemia, unspecified: Secondary | ICD-10-CM | POA: Diagnosis not present

## 2018-03-26 DIAGNOSIS — F329 Major depressive disorder, single episode, unspecified: Secondary | ICD-10-CM | POA: Diagnosis not present

## 2018-03-26 DIAGNOSIS — R8290 Unspecified abnormal findings in urine: Secondary | ICD-10-CM | POA: Diagnosis not present

## 2018-03-26 DIAGNOSIS — F3341 Major depressive disorder, recurrent, in partial remission: Secondary | ICD-10-CM | POA: Diagnosis not present

## 2018-03-26 DIAGNOSIS — E1140 Type 2 diabetes mellitus with diabetic neuropathy, unspecified: Secondary | ICD-10-CM | POA: Diagnosis not present

## 2018-03-27 DIAGNOSIS — S81802D Unspecified open wound, left lower leg, subsequent encounter: Secondary | ICD-10-CM | POA: Diagnosis not present

## 2018-03-27 DIAGNOSIS — I509 Heart failure, unspecified: Secondary | ICD-10-CM | POA: Diagnosis not present

## 2018-03-27 DIAGNOSIS — E119 Type 2 diabetes mellitus without complications: Secondary | ICD-10-CM | POA: Diagnosis not present

## 2018-03-27 DIAGNOSIS — F329 Major depressive disorder, single episode, unspecified: Secondary | ICD-10-CM | POA: Diagnosis not present

## 2018-03-27 DIAGNOSIS — I110 Hypertensive heart disease with heart failure: Secondary | ICD-10-CM | POA: Diagnosis not present

## 2018-03-29 DIAGNOSIS — S81802D Unspecified open wound, left lower leg, subsequent encounter: Secondary | ICD-10-CM | POA: Diagnosis not present

## 2018-03-29 DIAGNOSIS — F329 Major depressive disorder, single episode, unspecified: Secondary | ICD-10-CM | POA: Diagnosis not present

## 2018-03-29 DIAGNOSIS — E119 Type 2 diabetes mellitus without complications: Secondary | ICD-10-CM | POA: Diagnosis not present

## 2018-03-29 DIAGNOSIS — I509 Heart failure, unspecified: Secondary | ICD-10-CM | POA: Diagnosis not present

## 2018-03-29 DIAGNOSIS — I110 Hypertensive heart disease with heart failure: Secondary | ICD-10-CM | POA: Diagnosis not present

## 2018-03-31 ENCOUNTER — Other Ambulatory Visit: Payer: Medicare Other

## 2018-03-31 ENCOUNTER — Ambulatory Visit
Admission: RE | Admit: 2018-03-31 | Discharge: 2018-03-31 | Disposition: A | Discharge status: Home / Self Care | Payer: Medicare Other | Source: Ambulatory Visit | Attending: Neurology | Admitting: Neurology

## 2018-03-31 DIAGNOSIS — I6381 Other cerebral infarction due to occlusion or stenosis of small artery: Secondary | ICD-10-CM

## 2018-03-31 MED ORDER — GADOBENATE DIMEGLUMINE 529 MG/ML IV SOLN
20.0000 mL | Freq: Once | INTRAVENOUS | Status: AC | PRN
  Administered 2018-03-31: 20 mL via INTRAVENOUS

## 2018-03-31 NOTE — Progress Notes (Deleted)
Cardiology Office Note   Date:  03/31/2018   ID:  Shannon Carroll, DOB 1950/10/07, MRN [id number]  PCP:  Harlan Stains, MD  Cardiologist:   No chief complaint on file.    History of Present Illness: Shannon Carroll is a 68 y.o. female who presents for ongoing assessment of moderate aortic stenosis, and mitral stenosis, HTN, HL, with other history of CKD II, Diabetes Type II, and morbid obesity.  He was recently diagnosed with a acute vs subacute CVA per MRI on 02/14/2018. She is a resident of [facility] due to deconditioning.   She was recommended for a TEE to evaluate her aortic and mitral valve stenosis but she did not want to pursue further testing when seen last in the office 02/20/2018 by Armanda Heritage. A BMET was ordered. Creatinine was improved from 1.35 to 1.07.   Past Medical History:  Diagnosis Date  . Adenomatous polyps 12/14   f/u 5 yrs dr Penelope Coop  . Arthritis   . Chronic kidney disease    stage 2  . Depression   . Diabetes mellitus without complication ([place])   . Hirsutism   . Hyperlipidemia   . Hypertension   . Murmur   . Obesity   . Situational stress   . Stroke [facility])     Past Surgical History:  Procedure Laterality Date  . CESAREAN SECTION  12/23/1980  . COLONOSCOPY  06/2005-09/2007  . DILATION AND CURETTAGE OF UTERUS    . RIGHT/LEFT HEART CATH AND CORONARY ANGIOGRAPHY N/A 02/04/2018   Procedure: RIGHT/LEFT HEART CATH AND CORONARY ANGIOGRAPHY;  Surgeon: Nelva Bush, MD;  Location: [facility];  Service: Cardiovascular;  Laterality: N/A;  . TONSILLECTOMY AND ADENOIDECTOMY  1956     Current Outpatient Medications  Medication Sig Dispense Refill  . clopidogrel (PLAVIX) 75 MG tablet Take 1 tablet (75 mg total) by mouth daily. 90 tablet 1  . METFORMIN HCL PO Take 1,000 mg by mouth 2 (two) times daily.    . Multiple Vitamins-Minerals (CENTRUM SILVER PO) Take by mouth.    . simvastatin (ZOCOR) 40 MG tablet Take 40 mg by mouth every evening.     .  valsartan-hydrochlorothiazide (DIOVAN-HCT) 320-25 MG tablet Take 1 tablet by mouth daily.    Marland Kitchen venlafaxine (EFFEXOR) 37.5 MG tablet Take 37.5-112.5 mg by mouth See admin instructions. Take 3 tablets (112.5 mg) by mouth in the morning & take 1 tablet (37.5 mg) by mouth at night.     Current Facility-Administered Medications  Medication Dose Route Frequency Provider Last Rate Last Dose  . clopidogrel (PLAVIX) tablet 75 mg  75 mg Oral Daily Garvin Fila, MD        Allergies:   Lisinopril    Social History:  The patient  reports that she quit smoking about 24 years ago. She has never used smokeless tobacco. She reports current alcohol use. She reports that she does not use drugs.   Family History:  The patient's family history includes CAD in her father; Diabetes in her brother, mother, and sister; Heart attack in her father; Heart failure in her mother; Hypertension in her brother, father, mother, and sister; Skin cancer in her father.    ROS: All other systems are reviewed and negative. Unless otherwise mentioned in H&P    PHYSICAL EXAM: VS:  There were no vitals taken for this visit. , BMI There is no height or weight on file to calculate BMI. GEN: Well nourished, well developed, in no acute distress  HEENT: normal Neck: no JVD, carotid bruits, or masses Cardiac: ***RRR; no murmurs, rubs, or gallops,no edema  Respiratory:  Clear to auscultation bilaterally, normal work of breathing GI: soft, nontender, nondistended, + BS MS: no deformity or atrophy Skin: warm and dry, no rash Neuro:  Strength and sensation are intact Psych: euthymic mood, full affect   EKG:  EKG {ACTION; IS/IS [id number]} ordered today. The ekg ordered today demonstrates ***   Recent Labs: 01/30/2018: Hemoglobin 11.5; Platelets 275; TSH 1.620 02/20/2018: BUN 18; Creatinine, Ser 1.07; Potassium 4.7; Sodium 144    Lipid Panel    Component Value Date/Time   CHOL 153 03/02/2018 1643   TRIG 75 03/02/2018  1643   HDL 62 03/02/2018 1643   CHOLHDL 2.5 03/02/2018 1643   LDLCALC 76 03/02/2018 1643      Wt Readings from Last 3 Encounters:  03/02/18 298 lb (135.2 kg)  02/20/18 (!) 306 lb (138.8 kg)  02/04/18 297 lb (134.7 kg)      Other studies Reviewed: ECHO: 01/29/2018 - Procedure narrative: Transthoracic echocardiography. Image quality was suboptimal. The study was technically difficult. Intravenous contrast (Definity) was administered to opacify theLV. - Left ventricle: The cavity size was normal. Wall thickness was increased in a pattern of moderate LVH. Systolic function was vigorous. The estimated ejection fraction was in the range of 65% to 70%. Wall motion was normal; there were no regional wall motion abnormalities. Features are consistent with a pseudonormal left ventricular filling pattern, with concomitant abnormal relaxation and increased filling pressure (grade 2 diastolic dysfunction). - Aortic valve: The aortic valve is not seen well. There is a gradient measured but this may be due to the dynamic mid LV obstruction and not the AV Suggest TEE if clinically indicated for further evaluation of the AV . Doppler: VTI ratio of LVOT to aortic valve: 0.51. Valve area (VTI): 1.76 cm^2. Indexed valve area (VTI): 0.69 cm^2/m^2. Peak velocity ratio of LVOT to aortic valve: 0.45. Valve area (Vmax): 1.55 cm^2. Indexed valve area (Vmax): 0.6 cm^2/m^2. Mean velocity ratio of LVOT to aortic valve: 0.49. Valve area (Vmean): 1.68 cm^2. Indexed valve area (Vmean): 0.66 cm^2/m^2. Mean gradient (S): 10 mm Hg. Peak gradient (S): 20 mm Hg. - Mitral valve: The findings are consistent with moderate stenosis. - Left atrium: The atrium was moderately dilated.  Impressions:  - Technically difficult echo with poor image quality  CATH: 02/04/2018 FINDINGS: 1. Mild to moderate, non-obstructive coronary artery disease. 2. Very mild aortic stenosis. 3. Probably  moderate mitral stenosis as well as suspected significant mitral regurgitation. 4. Mildly elevated left heart filling pressures. 5. Moderate pulmonary hypertension. 6. Normal Fick cardiac output/index. 7. 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 Collateral filling of the right brachial artery was noted.  ASSESSMENT AND PLAN:  1.  ***   Current medicines are reviewed at length with the patient today.    Labs/ tests ordered today include: *** Phill Myron. West Pugh, ANP, AACC   03/31/2018 2:49 PM    [address] Office [phone] Fax [phone]

## 2018-04-01 ENCOUNTER — Ambulatory Visit: Payer: Medicare Other | Admitting: Adult Health

## 2018-04-01 DIAGNOSIS — I509 Heart failure, unspecified: Secondary | ICD-10-CM | POA: Diagnosis not present

## 2018-04-01 DIAGNOSIS — I110 Hypertensive heart disease with heart failure: Secondary | ICD-10-CM | POA: Diagnosis not present

## 2018-04-01 DIAGNOSIS — S81802D Unspecified open wound, left lower leg, subsequent encounter: Secondary | ICD-10-CM | POA: Diagnosis not present

## 2018-04-01 DIAGNOSIS — F329 Major depressive disorder, single episode, unspecified: Secondary | ICD-10-CM | POA: Diagnosis not present

## 2018-04-01 DIAGNOSIS — E119 Type 2 diabetes mellitus without complications: Secondary | ICD-10-CM | POA: Diagnosis not present

## 2018-04-02 ENCOUNTER — Encounter: Payer: Self-pay | Admitting: Adult Health

## 2018-04-02 ENCOUNTER — Telehealth: Payer: Self-pay | Admitting: Adult Health

## 2018-04-02 ENCOUNTER — Ambulatory Visit (INDEPENDENT_AMBULATORY_CARE_PROVIDER_SITE_OTHER): Payer: Medicare Other | Admitting: Adult Health

## 2018-04-02 VITALS — BP 160/88 | HR 112 | Ht 68.5 in | Wt 280.0 lb

## 2018-04-02 DIAGNOSIS — I110 Hypertensive heart disease with heart failure: Secondary | ICD-10-CM | POA: Diagnosis not present

## 2018-04-02 DIAGNOSIS — I10 Essential (primary) hypertension: Secondary | ICD-10-CM

## 2018-04-02 DIAGNOSIS — E119 Type 2 diabetes mellitus without complications: Secondary | ICD-10-CM | POA: Diagnosis not present

## 2018-04-02 DIAGNOSIS — Z79899 Other long term (current) drug therapy: Secondary | ICD-10-CM | POA: Diagnosis not present

## 2018-04-02 DIAGNOSIS — S81802D Unspecified open wound, left lower leg, subsequent encounter: Secondary | ICD-10-CM | POA: Diagnosis not present

## 2018-04-02 DIAGNOSIS — I639 Cerebral infarction, unspecified: Secondary | ICD-10-CM

## 2018-04-02 DIAGNOSIS — I6381 Other cerebral infarction due to occlusion or stenosis of small artery: Secondary | ICD-10-CM

## 2018-04-02 DIAGNOSIS — F329 Major depressive disorder, single episode, unspecified: Secondary | ICD-10-CM | POA: Diagnosis not present

## 2018-04-02 DIAGNOSIS — I509 Heart failure, unspecified: Secondary | ICD-10-CM | POA: Diagnosis not present

## 2018-04-02 DIAGNOSIS — I350 Nonrheumatic aortic (valve) stenosis: Secondary | ICD-10-CM

## 2018-04-02 NOTE — Progress Notes (Signed)
Cardiology Office Note   Date:  04/02/2018   ID:  Shannon Carroll, DOB January 22, 1951, MRN [id number]  PCP:  Harlan Stains, MD  Cardiologist:  [facility]  Chief Complaint  Patient presents with  . Hypertension  . Aortic Stenosis    Moderate     History of Present Illness: Shannon Carroll is a 68 y.o. female who presents for ongoing assessment and management of moderate AoV stenosis and mtral stenosis, HTN, HLD with other history to include, CVA 01/2018,  diabetes, CKD Stage II,  Morbid obesity, adenomatous colon polyps and arthritis.   She had a right and left cardiac cath on 12/18 revealing non-obstructive disease with mildly elevated filling pressures.   She comes today with multiple somatic complaints. She is now in SNF permanently. She is depressed and anxious. She is also complaining of pills sticking when she is given them, causing nausea and vomiting. She is uncertain if she is getting all of the medications down.   Past Medical History:  Diagnosis Date  . Adenomatous polyps 12/14   f/u 5 yrs dr Penelope Coop  . Arthritis   . Chronic kidney disease    stage 2  . Depression   . Diabetes mellitus without complication ([place])   . Hirsutism   . Hyperlipidemia   . Hypertension   . Murmur   . Obesity   . Situational stress   . Stroke [facility])     Past Surgical History:  Procedure Laterality Date  . CESAREAN SECTION  12/23/1980  . COLONOSCOPY  06/2005-09/2007  . DILATION AND CURETTAGE OF UTERUS    . RIGHT/LEFT HEART CATH AND CORONARY ANGIOGRAPHY N/A 02/04/2018   Procedure: RIGHT/LEFT HEART CATH AND CORONARY ANGIOGRAPHY;  Surgeon: Nelva Bush, MD;  Location: [facility];  Service: Cardiovascular;  Laterality: N/A;  . TONSILLECTOMY AND ADENOIDECTOMY  1956     Current Outpatient Medications  Medication Sig Dispense Refill  . atorvastatin (LIPITOR) 40 MG tablet Take 40 mg by mouth daily.    Marland Kitchen buPROPion (WELLBUTRIN XL) 150 MG 24 hr tablet Take 150 mg by mouth daily.    .  cephALEXin (KEFLEX) 250 MG capsule Take 250 mg by mouth 2 (two) times daily.    . clonazePAM (KLONOPIN) 0.5 MG tablet Take 0.5 mg by mouth 2 (two) times daily as needed for anxiety.    . clopidogrel (PLAVIX) 75 MG tablet Take 1 tablet (75 mg total) by mouth daily. 90 tablet 1  . furosemide (LASIX) 20 MG tablet Take 20 mg by mouth daily as needed for fluid or edema.    . iron polysaccharides (NIFEREX) 150 MG capsule Take 150 mg by mouth daily.    . mirabegron ER (MYRBETRIQ) 25 MG TB24 tablet Take 25 mg by mouth daily.    . Multiple Vitamins-Minerals (CENTRUM SILVER PO) Take by mouth.    . nystatin (NYSTATIN) powder Apply 1 g topically 2 (two) times daily as needed (rash).    Marland Kitchen omeprazole (PRILOSEC) 40 MG capsule Take 40 mg by mouth daily.    Marland Kitchen venlafaxine (EFFEXOR) 37.5 MG tablet Take 37.5-75 mg by mouth See admin instructions. Take 2 tablets (112.5 mg) by mouth in the morning & take 1 tablet (37.5 mg) by mouth at night.    . valsartan-hydrochlorothiazide (DIOVAN-HCT) 320-25 MG tablet Take 1 tablet by mouth daily.     Current Facility-Administered Medications  Medication Dose Route Frequency Provider Last Rate Last Dose  . clopidogrel (PLAVIX) tablet 75 mg  75 mg Oral Daily Leonie Man,  Lucy [place]ua, MD        Allergies:   Lisinopril    Social History:  The patient  reports that she quit smoking about 24 years ago. She has never used smokeless tobacco. She reports current alcohol use. She reports that she does not use drugs.   Family History:  The patient's family history includes CAD in her father; Diabetes in her brother, mother, and sister; Heart attack in her father; Heart failure in her mother; Hypertension in her brother, father, mother, and sister; Skin cancer in her father.    ROS: All other systems are reviewed and negative. Unless otherwise mentioned in H&P    PHYSICAL EXAM: VS:  BP (!) 160/88   Pulse (!) 112   Ht 5' 8.5" (1.74 m)   Wt 280 lb (127 kg)   SpO2 92%   BMI 41.95 kg/m  ,  BMI Body mass index is 41.95 kg/m. GEN: Well nourished, well developed, in no acute distress, morbid obesity. HEENT: normal Neck: no JVD, carotid bruits, or masses Cardiac: RRR; 1/6 systolic murmur, rubs, or gallops,no edema  Respiratory:  Clear to auscultation bilaterally, normal work of breathing GI: soft, nontender, nondistended, + BS MS: no deformity or atrophy, bilateral wraps to the LE.  Skin: warm and dry, no rash Neuro:  Strength and sensation are intact Psych: euthymic mood, full affect, depressed.    EKG:  Not completed this office visit.   Recent Labs: 01/30/2018: Hemoglobin 11.5; Platelets 275; TSH 1.620 02/20/2018: BUN 18; Creatinine, Ser 1.07; Potassium 4.7; Sodium 144    Lipid Panel    Component Value Date/Time   CHOL 153 03/02/2018 1643   TRIG 75 03/02/2018 1643   HDL 62 03/02/2018 1643   CHOLHDL 2.5 03/02/2018 1643   LDLCALC 76 03/02/2018 1643      Wt Readings from Last 3 Encounters:  04/02/18 280 lb (127 kg)  03/02/18 298 lb (135.2 kg)  02/20/18 (!) 306 lb (138.8 kg)      Other studies Reviewed: - Procedure narrative: Transthoracic echocardiography. Image   quality was suboptimal. The study was technically difficult.   Intravenous contrast (Definity) was administered to opacify the   LV. - Left ventricle: The cavity size was normal. Wall thickness was   increased in a pattern of moderate LVH. Systolic function was   vigorous. The estimated ejection fraction was in the range of 65%   to 70%. Wall motion was normal; there were no regional wall   motion abnormalities. Features are consistent with a pseudonormal   left ventricular filling pattern, with concomitant abnormal   relaxation and increased filling pressure (grade 2 diastolic   dysfunction). - Mitral valve: The findings are consistent with moderate stenosis. - Left atrium: The atrium was moderately dilated.  Impressions:  - Technically difficult echo with poor image quality  ASSESSMENT  AND PLAN:  1.  Hypertension: Difficult to control.Uncertain if she is getting all of her medications down as she is "thowing them back up" after the pills are getting stuck in her throat. She states that taking them all at once makes her nauseated as well.  I have suggested that we change some daily doses so that there are not as many pills to take at once. Also to take them one or two at a time instead of in one big swallow of all of them at once.   Take atorvastatin and Protonix at HS.  Change to liquid if this is easier to swallow. She may need referral  to GI if this persists.   Has losartan listed as one of her medications, but she does not have this on medication list from SNF.  I will check labs to ascertain kidney function before restarting. She is elevated today, but very anxious.   2. Hx of CVA: Being followed by neurology. Had MRA of brain on 03/31/2018 but results are not available at the time of this visit. She is undergoing PT as well.   3.  Hx of AoV stenosis: With co-morbidities, she would not be a good candidate for intervention if this is required.Continue medical management.   4.CKD Stage II: Checking BMET.   Current medicines are reviewed at length with the patient today.  I have spent over 30 minutes with this patient discussing her medications and care.   Labs/ tests ordered today include: BMET.  Phill Myron. West Pugh, ANP, AACC   04/02/2018 12:38 PM    [facility] [address] Office [phone] Fax [phone]

## 2018-04-02 NOTE — Patient Instructions (Addendum)
Follow-Up: You will need a follow up appointment in 3 months.  You may see Minus Breeding, MD Jory Sims, DNP, AACC  or one of the following Advanced Practice Providers on your designated Care Team:  Jory Sims, DNP, AACC Rosaria Ferries, PA-C  Labwork: BMET TODAY HERE IN OUR OFFICE AT [facility] When you have labs (blood work) and your tests are completely normal, you will receive your results ONLY by MyChart Message (if you have MyChart) -OR- A paper copy in the mail.  Medication Instructions:  NO CHANGES- Your physician recommends that you continue on your current medications as directed. Please refer to the Current Medication list given to you today. If you need a refill on your cardiac medications before your next appointment, please call your pharmacy.  At [facility], you and your health needs are our priority.  As part of our continuing mission to provide you with exceptional heart care, we have created designated Provider Care Teams.  These Care Teams include your primary Cardiologist (physician) and Advanced Practice Providers (APPs -  Physician Assistants and Nurse Practitioners) who all work together to provide you with the care you need, when you need it.  Thank you for choosing [facility] at [facility]!!

## 2018-04-02 NOTE — Telephone Encounter (Signed)
Neew Message:      Pt saw Curt Bears today. Curt Bears said something about her seeing a GI Doctor. Please asked Curt Bears to send a referral to Dr Penelope Coop asap please.

## 2018-04-02 NOTE — Telephone Encounter (Signed)
Returned call to pt, her son answered informed him (DPR) note states that she is to be referred at a later date to try medication treatment first and also Belenda Cruise is out until Monday. Verbalized understanding and states that it is fine to await her return.

## 2018-04-03 DIAGNOSIS — I509 Heart failure, unspecified: Secondary | ICD-10-CM | POA: Diagnosis not present

## 2018-04-03 DIAGNOSIS — F329 Major depressive disorder, single episode, unspecified: Secondary | ICD-10-CM | POA: Diagnosis not present

## 2018-04-03 DIAGNOSIS — I110 Hypertensive heart disease with heart failure: Secondary | ICD-10-CM | POA: Diagnosis not present

## 2018-04-03 DIAGNOSIS — S81802D Unspecified open wound, left lower leg, subsequent encounter: Secondary | ICD-10-CM | POA: Diagnosis not present

## 2018-04-03 DIAGNOSIS — E119 Type 2 diabetes mellitus without complications: Secondary | ICD-10-CM | POA: Diagnosis not present

## 2018-04-03 LAB — BASIC METABOLIC PANEL
BUN/Creatinine Ratio: 13 (ref 12–28)
BUN: 14 mg/dL (ref 8–27)
CO2: 27 mmol/L (ref 20–29)
CREATININE: 1.06 mg/dL — AB (ref 0.57–1.00)
Calcium: 9 mg/dL (ref 8.7–10.3)
Chloride: 95 mmol/L — ABNORMAL LOW (ref 96–106)
GFR calc Af Amer: 63 mL/min/{1.73_m2} (ref >59)
GFR calc non Af Amer: 54 mL/min/{1.73_m2} — ABNORMAL LOW (ref >59)
GLUCOSE: 120 mg/dL — AB (ref 65–99)
Potassium: 3.7 mmol/L (ref 3.5–5.2)
Sodium: 139 mmol/L (ref 134–144)

## 2018-04-03 NOTE — Telephone Encounter (Signed)
She will need to be referred by her PCP. Right now she was complaining of swallowing pills, but able to eat food without problems. We changed the amount of pills she was taking at once, and also advised her to take smaller amounts of pills at a time instead of taking all of them in one swallow. If the symptoms of chocking persisted, then GI consult is recommended.

## 2018-04-05 DIAGNOSIS — S81802D Unspecified open wound, left lower leg, subsequent encounter: Secondary | ICD-10-CM | POA: Diagnosis not present

## 2018-04-05 DIAGNOSIS — E119 Type 2 diabetes mellitus without complications: Secondary | ICD-10-CM | POA: Diagnosis not present

## 2018-04-05 DIAGNOSIS — F329 Major depressive disorder, single episode, unspecified: Secondary | ICD-10-CM | POA: Diagnosis not present

## 2018-04-05 DIAGNOSIS — I509 Heart failure, unspecified: Secondary | ICD-10-CM | POA: Diagnosis not present

## 2018-04-05 DIAGNOSIS — I110 Hypertensive heart disease with heart failure: Secondary | ICD-10-CM | POA: Diagnosis not present

## 2018-04-06 ENCOUNTER — Telehealth: Payer: Self-pay

## 2018-04-06 NOTE — Telephone Encounter (Signed)
I called patients son Shannon Carroll that the MRA neck shows no major blockages in major vessels in the neck. Pts son verbalized understanding.   I called patients son Shannon Carroll on dpr that the MRA brain shows old right brain stroke and no new or unexpected or worrrisome findings, and the MRA brain shows no large vessels narrowing to worry about. The son verbalized understanding. ------ ------

## 2018-04-06 NOTE — Telephone Encounter (Signed)
Left detailed message (DPR)-call PCP for referral

## 2018-04-06 NOTE — Telephone Encounter (Signed)
-----   Message from Garvin Fila, MD sent at 04/03/2018  5:30 PM EST ----- Kindly inform patient that MRA neck shows no blockages in major vessels in the neck

## 2018-04-07 DIAGNOSIS — I509 Heart failure, unspecified: Secondary | ICD-10-CM | POA: Diagnosis not present

## 2018-04-07 DIAGNOSIS — I110 Hypertensive heart disease with heart failure: Secondary | ICD-10-CM | POA: Diagnosis not present

## 2018-04-07 DIAGNOSIS — E119 Type 2 diabetes mellitus without complications: Secondary | ICD-10-CM | POA: Diagnosis not present

## 2018-04-07 DIAGNOSIS — S81802D Unspecified open wound, left lower leg, subsequent encounter: Secondary | ICD-10-CM | POA: Diagnosis not present

## 2018-04-07 DIAGNOSIS — F329 Major depressive disorder, single episode, unspecified: Secondary | ICD-10-CM | POA: Diagnosis not present

## 2018-04-10 DIAGNOSIS — S81802D Unspecified open wound, left lower leg, subsequent encounter: Secondary | ICD-10-CM | POA: Diagnosis not present

## 2018-04-10 DIAGNOSIS — I110 Hypertensive heart disease with heart failure: Secondary | ICD-10-CM | POA: Diagnosis not present

## 2018-04-10 DIAGNOSIS — I509 Heart failure, unspecified: Secondary | ICD-10-CM | POA: Diagnosis not present

## 2018-04-10 DIAGNOSIS — E119 Type 2 diabetes mellitus without complications: Secondary | ICD-10-CM | POA: Diagnosis not present

## 2018-04-10 DIAGNOSIS — F329 Major depressive disorder, single episode, unspecified: Secondary | ICD-10-CM | POA: Diagnosis not present

## 2018-04-14 ENCOUNTER — Ambulatory Visit: Payer: Medicare Other | Admitting: Neurology

## 2018-04-14 ENCOUNTER — Telehealth: Payer: Self-pay | Admitting: *Deleted

## 2018-04-14 NOTE — Telephone Encounter (Signed)
I called patient and Shannon Carroll pts friend about her scans and the report. I explained that the son was call on 04/06/2018 for the report. I stated the son requested I call him for any issues or concerns. Shannon Carroll stated to call [name] pts son for reports or issues. I gave Shannon Carroll the report on all three images, and she verbalized understanding. She pick pt on the phone too. I ask pt did her son give her the results The pt stated " my son told me everything look good it did not show anything new". I stated it only showed a old stroke not a new stroke.The pt repeated herself over and over again asking did her scans show anything". I gave pt the results three times She finally verbalized understanding.The friend Shannon Carroll and pt verbalized understanding.

## 2018-04-14 NOTE — Telephone Encounter (Signed)
I was called to the front desk by Hinton Dyer. I explained to pt and her friend that appt was cancel Mar 24, 2018 per provider request. When pt was seen on 03/02/2018 Dr Leonie Man recommend a 3 month follow up. He did not recommend to be seen within 4 weeks of MRI. I also explained to pt and Hilda Blades that it was cancel based on per facility pt was not having any issues after the appt. I explained the son and the [facility] CMA were made aware. The pt stated she has talk to her son about this.

## 2018-04-14 NOTE — Telephone Encounter (Signed)
Patient and her friend Hilda Blades were in the office today.  [facility] brought patient over for an appointment that was originally scheduled for today but had been canceled and rescheduled to April.    RN came up to explain to patient and Hilda Blades that appt had canceled with  Brittney med tech at [facility] [phone])  and she was to make [name] aware of change. Hilda Blades was concerned about the MRI results and RN made her aware that she has spoken to [place] about the MRI.  Hilda Blades is quite concerned about patient.  States she has an increase of symptoms and asked that this be noted in the chart.

## 2018-04-14 NOTE — Telephone Encounter (Signed)
I also explained to Hilda Blades why the appt was cancel today per provider. I stated Dr. Leonie Man recommend 3 month follow up. Hilda Blades stated her memory is not good at times. I stated pt was given memory information at last visit. DEbra verbalized understanding.

## 2018-04-15 ENCOUNTER — Other Ambulatory Visit: Payer: Self-pay | Admitting: Adult Health

## 2018-04-15 DIAGNOSIS — E119 Type 2 diabetes mellitus without complications: Secondary | ICD-10-CM | POA: Diagnosis not present

## 2018-04-15 DIAGNOSIS — F329 Major depressive disorder, single episode, unspecified: Secondary | ICD-10-CM | POA: Diagnosis not present

## 2018-04-15 DIAGNOSIS — S81802D Unspecified open wound, left lower leg, subsequent encounter: Secondary | ICD-10-CM | POA: Diagnosis not present

## 2018-04-15 DIAGNOSIS — I110 Hypertensive heart disease with heart failure: Secondary | ICD-10-CM | POA: Diagnosis not present

## 2018-04-15 DIAGNOSIS — I509 Heart failure, unspecified: Secondary | ICD-10-CM | POA: Diagnosis not present

## 2018-04-17 DIAGNOSIS — F329 Major depressive disorder, single episode, unspecified: Secondary | ICD-10-CM | POA: Diagnosis not present

## 2018-04-17 DIAGNOSIS — I509 Heart failure, unspecified: Secondary | ICD-10-CM | POA: Diagnosis not present

## 2018-04-17 DIAGNOSIS — E119 Type 2 diabetes mellitus without complications: Secondary | ICD-10-CM | POA: Diagnosis not present

## 2018-04-17 DIAGNOSIS — S81802D Unspecified open wound, left lower leg, subsequent encounter: Secondary | ICD-10-CM | POA: Diagnosis not present

## 2018-04-17 DIAGNOSIS — I110 Hypertensive heart disease with heart failure: Secondary | ICD-10-CM | POA: Diagnosis not present

## 2018-04-19 DIAGNOSIS — I110 Hypertensive heart disease with heart failure: Secondary | ICD-10-CM | POA: Diagnosis not present

## 2018-04-19 DIAGNOSIS — G3184 Mild cognitive impairment, so stated: Secondary | ICD-10-CM | POA: Diagnosis not present

## 2018-04-19 DIAGNOSIS — E119 Type 2 diabetes mellitus without complications: Secondary | ICD-10-CM | POA: Diagnosis not present

## 2018-04-19 DIAGNOSIS — Z6841 Body Mass Index (BMI) 40.0 and over, adult: Secondary | ICD-10-CM | POA: Diagnosis not present

## 2018-04-19 DIAGNOSIS — S81802D Unspecified open wound, left lower leg, subsequent encounter: Secondary | ICD-10-CM | POA: Diagnosis not present

## 2018-04-19 DIAGNOSIS — Z9181 History of falling: Secondary | ICD-10-CM | POA: Diagnosis not present

## 2018-04-19 DIAGNOSIS — F329 Major depressive disorder, single episode, unspecified: Secondary | ICD-10-CM | POA: Diagnosis not present

## 2018-04-19 DIAGNOSIS — I509 Heart failure, unspecified: Secondary | ICD-10-CM | POA: Diagnosis not present

## 2018-04-19 DIAGNOSIS — Z7984 Long term (current) use of oral hypoglycemic drugs: Secondary | ICD-10-CM | POA: Diagnosis not present

## 2018-04-20 DIAGNOSIS — I110 Hypertensive heart disease with heart failure: Secondary | ICD-10-CM | POA: Diagnosis not present

## 2018-04-20 DIAGNOSIS — F329 Major depressive disorder, single episode, unspecified: Secondary | ICD-10-CM | POA: Diagnosis not present

## 2018-04-20 DIAGNOSIS — E119 Type 2 diabetes mellitus without complications: Secondary | ICD-10-CM | POA: Diagnosis not present

## 2018-04-20 DIAGNOSIS — I509 Heart failure, unspecified: Secondary | ICD-10-CM | POA: Diagnosis not present

## 2018-04-20 DIAGNOSIS — S81802D Unspecified open wound, left lower leg, subsequent encounter: Secondary | ICD-10-CM | POA: Diagnosis not present

## 2018-04-21 DIAGNOSIS — I110 Hypertensive heart disease with heart failure: Secondary | ICD-10-CM | POA: Diagnosis not present

## 2018-04-21 DIAGNOSIS — F329 Major depressive disorder, single episode, unspecified: Secondary | ICD-10-CM | POA: Diagnosis not present

## 2018-04-21 DIAGNOSIS — E119 Type 2 diabetes mellitus without complications: Secondary | ICD-10-CM | POA: Diagnosis not present

## 2018-04-21 DIAGNOSIS — S81802D Unspecified open wound, left lower leg, subsequent encounter: Secondary | ICD-10-CM | POA: Diagnosis not present

## 2018-04-21 DIAGNOSIS — I509 Heart failure, unspecified: Secondary | ICD-10-CM | POA: Diagnosis not present

## 2018-04-22 DIAGNOSIS — S81802D Unspecified open wound, left lower leg, subsequent encounter: Secondary | ICD-10-CM | POA: Diagnosis not present

## 2018-04-22 DIAGNOSIS — E119 Type 2 diabetes mellitus without complications: Secondary | ICD-10-CM | POA: Diagnosis not present

## 2018-04-22 DIAGNOSIS — F329 Major depressive disorder, single episode, unspecified: Secondary | ICD-10-CM | POA: Diagnosis not present

## 2018-04-22 DIAGNOSIS — I509 Heart failure, unspecified: Secondary | ICD-10-CM | POA: Diagnosis not present

## 2018-04-22 DIAGNOSIS — I110 Hypertensive heart disease with heart failure: Secondary | ICD-10-CM | POA: Diagnosis not present

## 2018-04-23 ENCOUNTER — Telehealth: Payer: Self-pay

## 2018-04-23 NOTE — Telephone Encounter (Signed)
Pt's care advocate, Hassan Rowan, has called today to discuss patient's condition and her missed appt with [place] this past Sunday night 04/19/2018. Pt lives at [facility] nursing [facility]. She is a total care patient and will need full help getting in and out of bed. Also, she will need help using the bathroom. Hassan Rowan also stated pt has various stages of confusion.  A home sleep test was mentioned as a possibility for patient. Hassan Rowan was concerned that  patient would not keep it on during the night. How would you like to proceed with going forth with the sleep studies?

## 2018-04-24 DIAGNOSIS — I509 Heart failure, unspecified: Secondary | ICD-10-CM | POA: Diagnosis not present

## 2018-04-24 DIAGNOSIS — E119 Type 2 diabetes mellitus without complications: Secondary | ICD-10-CM | POA: Diagnosis not present

## 2018-04-24 DIAGNOSIS — F329 Major depressive disorder, single episode, unspecified: Secondary | ICD-10-CM | POA: Diagnosis not present

## 2018-04-24 DIAGNOSIS — I110 Hypertensive heart disease with heart failure: Secondary | ICD-10-CM | POA: Diagnosis not present

## 2018-04-24 DIAGNOSIS — S81802D Unspecified open wound, left lower leg, subsequent encounter: Secondary | ICD-10-CM | POA: Diagnosis not present

## 2018-04-24 NOTE — Telephone Encounter (Signed)
thanks

## 2018-04-24 NOTE — Telephone Encounter (Signed)
I would suggest we try a home sleep test, this has been ordered. Perhaps a caretaker or family member can stay with her overnight, to ensure that the home test equipment is applied correctly and stay on as best as possible.

## 2018-04-27 DIAGNOSIS — F329 Major depressive disorder, single episode, unspecified: Secondary | ICD-10-CM | POA: Diagnosis not present

## 2018-04-27 DIAGNOSIS — I110 Hypertensive heart disease with heart failure: Secondary | ICD-10-CM | POA: Diagnosis not present

## 2018-04-27 DIAGNOSIS — S81802D Unspecified open wound, left lower leg, subsequent encounter: Secondary | ICD-10-CM | POA: Diagnosis not present

## 2018-04-27 DIAGNOSIS — E119 Type 2 diabetes mellitus without complications: Secondary | ICD-10-CM | POA: Diagnosis not present

## 2018-04-27 DIAGNOSIS — I509 Heart failure, unspecified: Secondary | ICD-10-CM | POA: Diagnosis not present

## 2018-04-28 ENCOUNTER — Other Ambulatory Visit: Payer: Self-pay

## 2018-04-28 DIAGNOSIS — I509 Heart failure, unspecified: Secondary | ICD-10-CM | POA: Diagnosis not present

## 2018-04-28 DIAGNOSIS — I110 Hypertensive heart disease with heart failure: Secondary | ICD-10-CM | POA: Diagnosis not present

## 2018-04-28 DIAGNOSIS — S81802D Unspecified open wound, left lower leg, subsequent encounter: Secondary | ICD-10-CM | POA: Diagnosis not present

## 2018-04-28 DIAGNOSIS — F329 Major depressive disorder, single episode, unspecified: Secondary | ICD-10-CM | POA: Diagnosis not present

## 2018-04-28 DIAGNOSIS — E119 Type 2 diabetes mellitus without complications: Secondary | ICD-10-CM | POA: Diagnosis not present

## 2018-04-28 MED ORDER — AMLODIPINE BESYLATE 2.5 MG PO TABS: 2.5000 mg | ORAL_TABLET | Freq: Every day | ORAL | 12 refills | Status: DC

## 2018-04-29 ENCOUNTER — Ambulatory Visit: Payer: Medicare Other | Admitting: Adult Health

## 2018-04-29 NOTE — Progress Notes (Signed)
Cardiology Office Note   Date:  05/04/2018   ID:  Shannon Carroll, DOB 03/12/1950, MRN [id number]  PCP:  Harlan Stains, MD  Cardiologist:  [facility]  Chief Complaint  Patient presents with  . Congestive Heart Failure  . Hypertension     History of Present Illness: Shannon Carroll is a 68 y.o. female who presents for ongoing assessment and management of moderate AoV stenosis and mtral stenosis, HTN, HLD with other history to include, CVA 01/2018,  diabetes, CKD Stage II,  Morbid obesity, adenomatous colon polyps and arthritis.   She had a right and left cardiac cath on 12/18 revealing non-obstructive disease with mildly elevated filling pressures.   On last office visit, she complained of choking when trying to swallow her pills at the SNF. She is uncertain if all of them are going down, as she sometimes throws them up. She was advised to take smaller amounts of pills at a time instead of swallowing large amount of pills at once.   MRA  Pf the neck per Dr. Leonie Man did not reveal any new areas or worrisome findings.   I received a letter from her PCP, Dr.While who has concerns about elevated BNP with know history of CHF. She has a history of moderate Aortic Valve stenosis. She is here for further evaluation.   She is to have a sleep study ordered by neurology. This is still pending.   She is able to swallow pills now by taking smaller amounts of pills at a time, and slowing down between taking more pills. She is incontinent of urine but is having better results. She is also being treated for cellulitis of the left lower extremity. She remains essentially wheel chair bound and is very sedentary.    Past Medical History:  Diagnosis Date  . Adenomatous polyps 12/14   f/u 5 yrs dr Penelope Coop  . Arthritis   . Chronic kidney disease    stage 2  . Depression   . Diabetes mellitus without complication ([place])   . Hirsutism   . Hyperlipidemia   . Hypertension   . Murmur   . Obesity   .  Situational stress   . Stroke [facility])     Past Surgical History:  Procedure Laterality Date  . CESAREAN SECTION  12/23/1980  . COLONOSCOPY  06/2005-09/2007  . DILATION AND CURETTAGE OF UTERUS    . RIGHT/LEFT HEART CATH AND CORONARY ANGIOGRAPHY N/A 02/04/2018   Procedure: RIGHT/LEFT HEART CATH AND CORONARY ANGIOGRAPHY;  Surgeon: Nelva Bush, MD;  Location: [place] [facility];  Service: Cardiovascular;  Laterality: N/A;  . TONSILLECTOMY AND ADENOIDECTOMY  1956     Current Outpatient Medications  Medication Sig Dispense Refill  . amLODipine (NORVASC) 2.5 MG tablet Take 1 tablet (2.5 mg total) by mouth daily. 30 tablet 12  . atorvastatin (LIPITOR) 40 MG tablet Take 40 mg by mouth daily.    Marland Kitchen buPROPion (WELLBUTRIN XL) 150 MG 24 hr tablet Take 150 mg by mouth daily.    . clonazePAM (KLONOPIN) 0.5 MG tablet Take 0.5 mg by mouth 2 (two) times daily as needed for anxiety.    . clopidogrel (PLAVIX) 75 MG tablet Take 1 tablet (75 mg total) by mouth daily. 90 tablet 1  . furosemide (LASIX) 20 MG tablet Take 20 mg by mouth daily as needed for fluid or edema.    . iron polysaccharides (NIFEREX) 150 MG capsule Take 150 mg by mouth daily.    . mirabegron ER (MYRBETRIQ) 25 MG  TB24 tablet Take 25 mg by mouth daily.    . Multiple Vitamins-Minerals (CENTRUM SILVER PO) Take by mouth.    . nystatin (NYSTATIN) powder Apply 1 g topically 2 (two) times daily as needed (rash).    Marland Kitchen omeprazole (PRILOSEC) 40 MG capsule TAKE (1) CAPSULE BY MOUTH AT BEDTIME. 30 capsule 5  . valsartan-hydrochlorothiazide (DIOVAN-HCT) 320-25 MG tablet Take 1 tablet by mouth daily.    Marland Kitchen venlafaxine (EFFEXOR) 37.5 MG tablet Take 37.5-75 mg by mouth See admin instructions. Take 2 tablets (112.5 mg) by mouth in the morning & take 1 tablet (37.5 mg) by mouth at night.     No current facility-administered medications for this visit.     Allergies:   Lisinopril    Social History:  The patient  reports that she quit smoking about 24  years ago. She has never used smokeless tobacco. She reports current alcohol use. She reports that she does not use drugs.   Family History:  The patient's family history includes CAD in her father; Diabetes in her brother, mother, and sister; Heart attack in her father; Heart failure in her mother; Hypertension in her brother, father, mother, and sister; Skin cancer in her father.    ROS: All other systems are reviewed and negative. Unless otherwise mentioned in H&P    PHYSICAL EXAM: VS:  BP 139/89   Pulse 93   Ht 5' 8.5" (1.74 m)   Wt 280 lb (127 kg) Comment: UNABLE TO OBTAIN CURRENT WEIGHT  SpO2 97%   BMI 41.95 kg/m  , BMI Body mass index is 41.95 kg/m. GEN: Well nourished, well developed, in no acute distress, morbidly obese.  HEENT: normal Neck: no JVD, carotid bruits, or masses Cardiac: RRR; no murmurs, rubs, or gallops,2+ woody edema bilateral LE. ACE wrap to the left lower extremity.  Respiratory:  Clear to auscultation bilaterally, normal work of breathing GI: soft, nontender, nondistended, + BS MS: no deformity or atrophy Skin: warm and dry, no rash Neuro:  Strength and sensation are intact Psych: euthymic mood, full affect   EKG:  No completed this office visit.   Recent Labs: 01/30/2018: Hemoglobin 11.5; Platelets 275; TSH 1.620 04/02/2018: BUN 14; Creatinine, Ser 1.06; Potassium 3.7; Sodium 139    Lipid Panel    Component Value Date/Time   CHOL 153 03/02/2018 1643   TRIG 75 03/02/2018 1643   HDL 62 03/02/2018 1643   CHOLHDL 2.5 03/02/2018 1643   LDLCALC 76 03/02/2018 1643      Wt Readings from Last 3 Encounters:  05/04/18 280 lb (127 kg)  04/02/18 280 lb (127 kg)  03/02/18 298 lb (135.2 kg)      Other studies Reviewed: Echocardiogram 02/14/2018  1. Moderate, non-obstructive coronary artery disease with 40-50% proximal LCx stenosis.  Otherwise, no angiographically significant coronary artery disease.  Coronary arteries are notably tortuous,  suggestive of longstanding uncontrolled hypertension. 2. Mildly to moderately elevated left heart filling pressures. 3. Moderate to severe pulmonary hypertension. 4. Mildly elevated right heart filling pressures. 5. Minimally elevated aortic valve gradient without significant stenosis. 6. Normal to supranormal Fick cardiac output/index. 7. 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  Recommend alternative access for future catheterizations as well as blood pressure assessment using left arm or lower extremities.  ASSESSMENT AND PLAN:  1. Acute on chronic mixed CHF: Most recent EF of 40%-45% in 01/2018. She appears to have continued volume overload. Breathing status is ok when sedentary, but any exertion causes some dyspnea. This may  due to deconditioning as well as CHF.  She is only on lasix 20 mg prn, but I will now increase lasix to 40 mg daily for 3 days and continue lasix 20 mg daily thereafter. She will have weights completed weekly at the SNF. They are to record and send this to [place]. BMET and BNP drawn today.   2.  Urinary incontinence with malodorous urine:  I will check a UA.   3. Hypertension: BP is currently controlled. She will continue current regimen for now, She is on amlodipine which may be contributing to chronic LEE along with other factors listed above.   4. CAD: Non-obstructive with most recent cardiac cath 02/04/2018 with 40%-50% proximal LCx stenosis. Continue to treat medically.     Current medicines are reviewed at length with the patient today.    Labs/ tests ordered today include: BMET, BNP, UA.  Phill Myron. West Pugh, ANP, [facility]   05/04/2018 8:53 AM    [address] Office [phone] Fax [phone]

## 2018-04-30 DIAGNOSIS — F329 Major depressive disorder, single episode, unspecified: Secondary | ICD-10-CM | POA: Diagnosis not present

## 2018-04-30 DIAGNOSIS — I110 Hypertensive heart disease with heart failure: Secondary | ICD-10-CM | POA: Diagnosis not present

## 2018-04-30 DIAGNOSIS — E119 Type 2 diabetes mellitus without complications: Secondary | ICD-10-CM | POA: Diagnosis not present

## 2018-04-30 DIAGNOSIS — I509 Heart failure, unspecified: Secondary | ICD-10-CM | POA: Diagnosis not present

## 2018-04-30 DIAGNOSIS — S81802D Unspecified open wound, left lower leg, subsequent encounter: Secondary | ICD-10-CM | POA: Diagnosis not present

## 2018-05-03 DIAGNOSIS — E119 Type 2 diabetes mellitus without complications: Secondary | ICD-10-CM | POA: Diagnosis not present

## 2018-05-03 DIAGNOSIS — I509 Heart failure, unspecified: Secondary | ICD-10-CM | POA: Diagnosis not present

## 2018-05-03 DIAGNOSIS — F329 Major depressive disorder, single episode, unspecified: Secondary | ICD-10-CM | POA: Diagnosis not present

## 2018-05-03 DIAGNOSIS — S81802D Unspecified open wound, left lower leg, subsequent encounter: Secondary | ICD-10-CM | POA: Diagnosis not present

## 2018-05-03 DIAGNOSIS — I110 Hypertensive heart disease with heart failure: Secondary | ICD-10-CM | POA: Diagnosis not present

## 2018-05-04 ENCOUNTER — Ambulatory Visit (INDEPENDENT_AMBULATORY_CARE_PROVIDER_SITE_OTHER): Payer: Medicare Other | Admitting: Adult Health

## 2018-05-04 ENCOUNTER — Other Ambulatory Visit: Payer: Self-pay

## 2018-05-04 ENCOUNTER — Encounter: Payer: Self-pay | Admitting: Adult Health

## 2018-05-04 VITALS — BP 139/89 | HR 93 | Ht 68.5 in | Wt 280.0 lb

## 2018-05-04 DIAGNOSIS — E119 Type 2 diabetes mellitus without complications: Secondary | ICD-10-CM | POA: Diagnosis not present

## 2018-05-04 DIAGNOSIS — I10 Essential (primary) hypertension: Secondary | ICD-10-CM | POA: Diagnosis not present

## 2018-05-04 DIAGNOSIS — I43 Cardiomyopathy in diseases classified elsewhere: Secondary | ICD-10-CM

## 2018-05-04 DIAGNOSIS — R8290 Unspecified abnormal findings in urine: Secondary | ICD-10-CM | POA: Diagnosis not present

## 2018-05-04 DIAGNOSIS — S81802D Unspecified open wound, left lower leg, subsequent encounter: Secondary | ICD-10-CM | POA: Diagnosis not present

## 2018-05-04 DIAGNOSIS — I6381 Other cerebral infarction due to occlusion or stenosis of small artery: Secondary | ICD-10-CM | POA: Diagnosis not present

## 2018-05-04 DIAGNOSIS — I5042 Chronic combined systolic (congestive) and diastolic (congestive) heart failure: Secondary | ICD-10-CM

## 2018-05-04 DIAGNOSIS — R300 Dysuria: Secondary | ICD-10-CM | POA: Diagnosis not present

## 2018-05-04 DIAGNOSIS — F329 Major depressive disorder, single episode, unspecified: Secondary | ICD-10-CM | POA: Diagnosis not present

## 2018-05-04 DIAGNOSIS — Z79899 Other long term (current) drug therapy: Secondary | ICD-10-CM

## 2018-05-04 DIAGNOSIS — I509 Heart failure, unspecified: Secondary | ICD-10-CM | POA: Diagnosis not present

## 2018-05-04 DIAGNOSIS — I110 Hypertensive heart disease with heart failure: Secondary | ICD-10-CM | POA: Diagnosis not present

## 2018-05-04 NOTE — Patient Instructions (Addendum)
Medication Instructions:  INCREASE LASIX 40MG  DAILY X3 DAYS THEN BACK TO 20MG  DAILY If you need a refill on your cardiac medications before your next appointment, please call your pharmacy.  Labwork: UA, BMET AND BNP HERE IN OUR OFFICE AT [facility]   Take the provided lab slips with you to the lab for your blood draw.   When you have your labs (blood work) drawn today and your tests are completely normal, you will receive your results only by MyChart Message (if you have MyChart) -OR-  A paper copy in the mail.  If you have any lab test that is abnormal or we need to change your treatment, we will call you to review these results.  Follow-Up: KEEP SCHEDULED FOLLOW UP.  At [facility], you and your health needs are our priority.  As part of our continuing mission to provide you with exceptional heart care, we have created designated Provider Care Teams.  These Care Teams include your primary Cardiologist (physician) and Advanced Practice Providers (APPs -  Physician Assistants and Nurse Practitioners) who all work together to provide you with the care you need, when you need it.  Thank you for choosing [facility] at [facility]!!

## 2018-05-05 DIAGNOSIS — F329 Major depressive disorder, single episode, unspecified: Secondary | ICD-10-CM | POA: Diagnosis not present

## 2018-05-05 DIAGNOSIS — S81802D Unspecified open wound, left lower leg, subsequent encounter: Secondary | ICD-10-CM | POA: Diagnosis not present

## 2018-05-05 DIAGNOSIS — I110 Hypertensive heart disease with heart failure: Secondary | ICD-10-CM | POA: Diagnosis not present

## 2018-05-05 DIAGNOSIS — E119 Type 2 diabetes mellitus without complications: Secondary | ICD-10-CM | POA: Diagnosis not present

## 2018-05-05 DIAGNOSIS — I509 Heart failure, unspecified: Secondary | ICD-10-CM | POA: Diagnosis not present

## 2018-05-06 ENCOUNTER — Encounter (HOSPITAL_COMMUNITY): Payer: Self-pay | Admitting: Internal Medicine

## 2018-05-06 ENCOUNTER — Emergency Department (HOSPITAL_COMMUNITY): Payer: Medicare Other

## 2018-05-06 ENCOUNTER — Inpatient Hospital Stay (HOSPITAL_COMMUNITY)
Admission: EM | Admit: 2018-05-06 | Discharge: 2018-05-19 | DRG: 291 | Disposition: A | Discharge status: Skilled Nursing Facility | Payer: Medicare Other | Attending: Internal Medicine | Admitting: Internal Medicine

## 2018-05-06 ENCOUNTER — Other Ambulatory Visit: Payer: Self-pay

## 2018-05-06 DIAGNOSIS — R278 Other lack of coordination: Secondary | ICD-10-CM | POA: Diagnosis not present

## 2018-05-06 DIAGNOSIS — T17908A Unspecified foreign body in respiratory tract, part unspecified causing other injury, initial encounter: Secondary | ICD-10-CM

## 2018-05-06 DIAGNOSIS — E1165 Type 2 diabetes mellitus with hyperglycemia: Secondary | ICD-10-CM | POA: Diagnosis present

## 2018-05-06 DIAGNOSIS — Z79899 Other long term (current) drug therapy: Secondary | ICD-10-CM

## 2018-05-06 DIAGNOSIS — I509 Heart failure, unspecified: Secondary | ICD-10-CM

## 2018-05-06 DIAGNOSIS — F329 Major depressive disorder, single episode, unspecified: Secondary | ICD-10-CM | POA: Diagnosis present

## 2018-05-06 DIAGNOSIS — N179 Acute kidney failure, unspecified: Secondary | ICD-10-CM | POA: Diagnosis present

## 2018-05-06 DIAGNOSIS — I083 Combined rheumatic disorders of mitral, aortic and tricuspid valves: Secondary | ICD-10-CM | POA: Diagnosis present

## 2018-05-06 DIAGNOSIS — D72819 Decreased white blood cell count, unspecified: Secondary | ICD-10-CM | POA: Diagnosis present

## 2018-05-06 DIAGNOSIS — I080 Rheumatic disorders of both mitral and aortic valves: Secondary | ICD-10-CM | POA: Diagnosis present

## 2018-05-06 DIAGNOSIS — I130 Hypertensive heart and chronic kidney disease with heart failure and stage 1 through stage 4 chronic kidney disease, or unspecified chronic kidney disease: Principal | ICD-10-CM | POA: Diagnosis present

## 2018-05-06 DIAGNOSIS — J9601 Acute respiratory failure with hypoxia: Secondary | ICD-10-CM | POA: Diagnosis present

## 2018-05-06 DIAGNOSIS — I491 Atrial premature depolarization: Secondary | ICD-10-CM | POA: Diagnosis not present

## 2018-05-06 DIAGNOSIS — F39 Unspecified mood [affective] disorder: Secondary | ICD-10-CM | POA: Diagnosis not present

## 2018-05-06 DIAGNOSIS — R0602 Shortness of breath: Secondary | ICD-10-CM

## 2018-05-06 DIAGNOSIS — M2550 Pain in unspecified joint: Secondary | ICD-10-CM | POA: Diagnosis not present

## 2018-05-06 DIAGNOSIS — T17908D Unspecified foreign body in respiratory tract, part unspecified causing other injury, subsequent encounter: Secondary | ICD-10-CM | POA: Diagnosis not present

## 2018-05-06 DIAGNOSIS — E873 Alkalosis: Secondary | ICD-10-CM | POA: Diagnosis present

## 2018-05-06 DIAGNOSIS — I471 Supraventricular tachycardia: Secondary | ICD-10-CM | POA: Diagnosis present

## 2018-05-06 DIAGNOSIS — N390 Urinary tract infection, site not specified: Secondary | ICD-10-CM | POA: Diagnosis present

## 2018-05-06 DIAGNOSIS — R945 Abnormal results of liver function studies: Secondary | ICD-10-CM | POA: Diagnosis present

## 2018-05-06 DIAGNOSIS — I6389 Other cerebral infarction: Secondary | ICD-10-CM | POA: Diagnosis not present

## 2018-05-06 DIAGNOSIS — J90 Pleural effusion, not elsewhere classified: Secondary | ICD-10-CM | POA: Diagnosis not present

## 2018-05-06 DIAGNOSIS — E662 Morbid (severe) obesity with alveolar hypoventilation: Secondary | ICD-10-CM | POA: Diagnosis not present

## 2018-05-06 DIAGNOSIS — E1122 Type 2 diabetes mellitus with diabetic chronic kidney disease: Secondary | ICD-10-CM | POA: Diagnosis present

## 2018-05-06 DIAGNOSIS — I639 Cerebral infarction, unspecified: Secondary | ICD-10-CM | POA: Diagnosis present

## 2018-05-06 DIAGNOSIS — J849 Interstitial pulmonary disease, unspecified: Secondary | ICD-10-CM | POA: Diagnosis present

## 2018-05-06 DIAGNOSIS — R1312 Dysphagia, oropharyngeal phase: Secondary | ICD-10-CM | POA: Diagnosis not present

## 2018-05-06 DIAGNOSIS — M6281 Muscle weakness (generalized): Secondary | ICD-10-CM | POA: Diagnosis not present

## 2018-05-06 DIAGNOSIS — G4733 Obstructive sleep apnea (adult) (pediatric): Secondary | ICD-10-CM | POA: Diagnosis present

## 2018-05-06 DIAGNOSIS — I10 Essential (primary) hypertension: Secondary | ICD-10-CM | POA: Diagnosis present

## 2018-05-06 DIAGNOSIS — Z7401 Bed confinement status: Secondary | ICD-10-CM | POA: Diagnosis not present

## 2018-05-06 DIAGNOSIS — E876 Hypokalemia: Secondary | ICD-10-CM | POA: Diagnosis present

## 2018-05-06 DIAGNOSIS — D649 Anemia, unspecified: Secondary | ICD-10-CM | POA: Diagnosis not present

## 2018-05-06 DIAGNOSIS — Z808 Family history of malignant neoplasm of other organs or systems: Secondary | ICD-10-CM

## 2018-05-06 DIAGNOSIS — I872 Venous insufficiency (chronic) (peripheral): Secondary | ICD-10-CM | POA: Diagnosis present

## 2018-05-06 DIAGNOSIS — R0689 Other abnormalities of breathing: Secondary | ICD-10-CM | POA: Diagnosis not present

## 2018-05-06 DIAGNOSIS — Z833 Family history of diabetes mellitus: Secondary | ICD-10-CM

## 2018-05-06 DIAGNOSIS — N183 Chronic kidney disease, stage 3 unspecified: Secondary | ICD-10-CM | POA: Diagnosis present

## 2018-05-06 DIAGNOSIS — R0600 Dyspnea, unspecified: Secondary | ICD-10-CM | POA: Diagnosis not present

## 2018-05-06 DIAGNOSIS — R1310 Dysphagia, unspecified: Secondary | ICD-10-CM | POA: Diagnosis present

## 2018-05-06 DIAGNOSIS — Z8673 Personal history of transient ischemic attack (TIA), and cerebral infarction without residual deficits: Secondary | ICD-10-CM

## 2018-05-06 DIAGNOSIS — F419 Anxiety disorder, unspecified: Secondary | ICD-10-CM | POA: Diagnosis present

## 2018-05-06 DIAGNOSIS — R4702 Dysphasia: Secondary | ICD-10-CM | POA: Diagnosis not present

## 2018-05-06 DIAGNOSIS — I5033 Acute on chronic diastolic (congestive) heart failure: Secondary | ICD-10-CM | POA: Diagnosis present

## 2018-05-06 DIAGNOSIS — D6489 Other specified anemias: Secondary | ICD-10-CM | POA: Diagnosis not present

## 2018-05-06 DIAGNOSIS — Z7409 Other reduced mobility: Secondary | ICD-10-CM | POA: Diagnosis not present

## 2018-05-06 DIAGNOSIS — I69919 Unspecified symptoms and signs involving cognitive functions following unspecified cerebrovascular disease: Secondary | ICD-10-CM | POA: Diagnosis not present

## 2018-05-06 DIAGNOSIS — Z888 Allergy status to other drugs, medicaments and biological substances status: Secondary | ICD-10-CM

## 2018-05-06 DIAGNOSIS — Z6841 Body Mass Index (BMI) 40.0 and over, adult: Secondary | ICD-10-CM | POA: Diagnosis not present

## 2018-05-06 DIAGNOSIS — R609 Edema, unspecified: Secondary | ICD-10-CM | POA: Diagnosis not present

## 2018-05-06 DIAGNOSIS — F32A Depression, unspecified: Secondary | ICD-10-CM | POA: Diagnosis present

## 2018-05-06 DIAGNOSIS — I959 Hypotension, unspecified: Secondary | ICD-10-CM | POA: Diagnosis not present

## 2018-05-06 DIAGNOSIS — J80 Acute respiratory distress syndrome: Secondary | ICD-10-CM | POA: Diagnosis not present

## 2018-05-06 DIAGNOSIS — R41841 Cognitive communication deficit: Secondary | ICD-10-CM | POA: Diagnosis not present

## 2018-05-06 DIAGNOSIS — Z8249 Family history of ischemic heart disease and other diseases of the circulatory system: Secondary | ICD-10-CM

## 2018-05-06 DIAGNOSIS — I2729 Other secondary pulmonary hypertension: Secondary | ICD-10-CM | POA: Diagnosis present

## 2018-05-06 DIAGNOSIS — R7989 Other specified abnormal findings of blood chemistry: Secondary | ICD-10-CM

## 2018-05-06 DIAGNOSIS — Z7902 Long term (current) use of antithrombotics/antiplatelets: Secondary | ICD-10-CM

## 2018-05-06 DIAGNOSIS — E785 Hyperlipidemia, unspecified: Secondary | ICD-10-CM | POA: Diagnosis present

## 2018-05-06 DIAGNOSIS — I110 Hypertensive heart disease with heart failure: Secondary | ICD-10-CM | POA: Diagnosis not present

## 2018-05-06 DIAGNOSIS — E119 Type 2 diabetes mellitus without complications: Secondary | ICD-10-CM | POA: Diagnosis not present

## 2018-05-06 DIAGNOSIS — Z87891 Personal history of nicotine dependence: Secondary | ICD-10-CM

## 2018-05-06 LAB — CBC WITH DIFFERENTIAL/PLATELET
Abs Immature Granulocytes: 0.02 10*3/uL (ref 0.00–0.07)
Basophils Absolute: 0 10*3/uL (ref 0.0–0.1)
Basophils Relative: 1 %
Eosinophils Absolute: 0.2 10*3/uL (ref 0.0–0.5)
Eosinophils Relative: 2 %
HCT: 32.4 % — ABNORMAL LOW (ref 36.0–46.0)
Hemoglobin: 9.3 g/dL — ABNORMAL LOW (ref 12.0–15.0)
Immature Granulocytes: 0 %
Lymphocytes Relative: 10 %
Lymphs Abs: 0.7 10*3/uL (ref 0.7–4.0)
MCH: 26.2 pg (ref 26.0–34.0)
MCHC: 28.7 g/dL — ABNORMAL LOW (ref 30.0–36.0)
MCV: 91.3 fL (ref 80.0–100.0)
Monocytes Absolute: 0.6 10*3/uL (ref 0.1–1.0)
Monocytes Relative: 8 %
Neutro Abs: 5.7 10*3/uL (ref 1.7–7.7)
Neutrophils Relative %: 79 %
Platelets: 285 10*3/uL (ref 150–400)
RBC: 3.55 MIL/uL — ABNORMAL LOW (ref 3.87–5.11)
RDW: 17.6 % — ABNORMAL HIGH (ref 11.5–15.5)
WBC: 7.2 10*3/uL (ref 4.0–10.5)
nRBC: 0 % (ref 0.0–0.2)

## 2018-05-06 LAB — I-STAT TROPONIN, ED: Troponin i, poc: 0.05 ng/mL (ref 0.00–0.08)

## 2018-05-06 LAB — URINALYSIS, ROUTINE W REFLEX MICROSCOPIC
Bilirubin Urine: NEGATIVE
Glucose, UA: NEGATIVE mg/dL
Hgb urine dipstick: NEGATIVE
Ketones, ur: NEGATIVE mg/dL
Leukocytes,Ua: NEGATIVE
Nitrite: POSITIVE — AB
Protein, ur: NEGATIVE mg/dL
Specific Gravity, Urine: 1.005 (ref 1.005–1.030)
pH: 5 (ref 5.0–8.0)

## 2018-05-06 LAB — COMPREHENSIVE METABOLIC PANEL WITH GFR
ALT: 18 U/L (ref 0–44)
AST: 29 U/L (ref 15–41)
Albumin: 3 g/dL — ABNORMAL LOW (ref 3.5–5.0)
Alkaline Phosphatase: 78 U/L (ref 38–126)
Anion gap: 12 (ref 5–15)
BUN: 10 mg/dL (ref 8–23)
CO2: 25 mmol/L (ref 22–32)
Calcium: 8.5 mg/dL — ABNORMAL LOW (ref 8.9–10.3)
Chloride: 104 mmol/L (ref 98–111)
Creatinine, Ser: 1.32 mg/dL — ABNORMAL HIGH (ref 0.44–1.00)
GFR calc Af Amer: 48 mL/min — ABNORMAL LOW
GFR calc non Af Amer: 42 mL/min — ABNORMAL LOW
Glucose, Bld: 115 mg/dL — ABNORMAL HIGH (ref 70–99)
Potassium: 3.2 mmol/L — ABNORMAL LOW (ref 3.5–5.1)
Sodium: 141 mmol/L (ref 135–145)
Total Bilirubin: 1 mg/dL (ref 0.3–1.2)
Total Protein: 6.5 g/dL (ref 6.5–8.1)

## 2018-05-06 LAB — MAGNESIUM: Magnesium: 1.8 mg/dL (ref 1.7–2.4)

## 2018-05-06 LAB — BRAIN NATRIURETIC PEPTIDE: B Natriuretic Peptide: 1077.4 pg/mL — ABNORMAL HIGH (ref 0.0–100.0)

## 2018-05-06 MED ORDER — CLONAZEPAM 0.5 MG PO TABS
0.5000 mg | ORAL_TABLET | Freq: Two times a day (BID) | ORAL | Status: DC | PRN
  Administered 2018-05-07 – 2018-05-18 (×3): 0.5 mg via ORAL
  Filled 2018-05-06 (×5): qty 1

## 2018-05-06 MED ORDER — ATORVASTATIN CALCIUM 40 MG PO TABS
40.0000 mg | ORAL_TABLET | Freq: Every day | ORAL | Status: DC
  Administered 2018-05-07 – 2018-05-13 (×7): 40 mg via ORAL
  Filled 2018-05-06 (×8): qty 1

## 2018-05-06 MED ORDER — HYDROXYZINE HCL 10 MG PO TABS
10.0000 mg | ORAL_TABLET | Freq: Three times a day (TID) | ORAL | Status: DC | PRN
  Administered 2018-05-09: 10 mg via ORAL
  Filled 2018-05-06 (×2): qty 1

## 2018-05-06 MED ORDER — SODIUM CHLORIDE 0.9 % IV SOLN
250.0000 mL | INTRAVENOUS | Status: DC | PRN
  Administered 2018-05-10 – 2018-05-16 (×3): 250 mL via INTRAVENOUS

## 2018-05-06 MED ORDER — POLYSACCHARIDE IRON COMPLEX 150 MG PO CAPS
150.0000 mg | ORAL_CAPSULE | Freq: Every day | ORAL | Status: DC
  Administered 2018-05-07 – 2018-05-19 (×12): 150 mg via ORAL
  Filled 2018-05-06 (×13): qty 1

## 2018-05-06 MED ORDER — POTASSIUM CHLORIDE 20 MEQ/15ML (10%) PO SOLN
40.0000 meq | Freq: Once | ORAL | Status: AC
  Administered 2018-05-06: 40 meq via ORAL
  Filled 2018-05-06: qty 30

## 2018-05-06 MED ORDER — FUROSEMIDE 10 MG/ML IJ SOLN
40.0000 mg | Freq: Once | INTRAMUSCULAR | Status: AC
  Administered 2018-05-06: 40 mg via INTRAVENOUS
  Filled 2018-05-06: qty 4

## 2018-05-06 MED ORDER — PANTOPRAZOLE SODIUM 40 MG PO TBEC
40.0000 mg | DELAYED_RELEASE_TABLET | Freq: Every day | ORAL | Status: DC
  Administered 2018-05-07 – 2018-05-19 (×11): 40 mg via ORAL
  Filled 2018-05-06: qty 2
  Filled 2018-05-06: qty 1
  Filled 2018-05-06: qty 2
  Filled 2018-05-06 (×3): qty 1
  Filled 2018-05-06: qty 2
  Filled 2018-05-06: qty 1
  Filled 2018-05-06: qty 2
  Filled 2018-05-06 (×2): qty 1
  Filled 2018-05-06: qty 2
  Filled 2018-05-06: qty 1

## 2018-05-06 MED ORDER — AMLODIPINE BESYLATE 2.5 MG PO TABS
2.5000 mg | ORAL_TABLET | Freq: Every day | ORAL | Status: DC
  Administered 2018-05-07 – 2018-05-10 (×4): 2.5 mg via ORAL
  Filled 2018-05-06 (×4): qty 1

## 2018-05-06 MED ORDER — ENOXAPARIN SODIUM 40 MG/0.4ML ~~LOC~~ SOLN
40.0000 mg | SUBCUTANEOUS | Status: DC
  Administered 2018-05-06 – 2018-05-12 (×7): 40 mg via SUBCUTANEOUS
  Filled 2018-05-06 (×7): qty 0.4

## 2018-05-06 MED ORDER — VENLAFAXINE HCL 75 MG PO TABS
112.5000 mg | ORAL_TABLET | Freq: Every day | ORAL | Status: DC
  Administered 2018-05-07 – 2018-05-15 (×9): 112.5 mg via ORAL
  Filled 2018-05-06 (×11): qty 1

## 2018-05-06 MED ORDER — DM-GUAIFENESIN ER 30-600 MG PO TB12
1.0000 | ORAL_TABLET | Freq: Two times a day (BID) | ORAL | Status: DC | PRN
  Administered 2018-05-07 – 2018-05-18 (×4): 1 via ORAL
  Filled 2018-05-06 (×4): qty 1

## 2018-05-06 MED ORDER — VENLAFAXINE HCL 37.5 MG PO TABS: 37.5000 mg | ORAL_TABLET | ORAL | Status: DC

## 2018-05-06 MED ORDER — LEVALBUTEROL HCL 1.25 MG/0.5ML IN NEBU
1.2500 mg | INHALATION_SOLUTION | Freq: Four times a day (QID) | RESPIRATORY_TRACT | Status: DC
  Administered 2018-05-06: 1.25 mg via RESPIRATORY_TRACT
  Filled 2018-05-06: qty 0.5

## 2018-05-06 MED ORDER — CLOPIDOGREL BISULFATE 75 MG PO TABS
75.0000 mg | ORAL_TABLET | Freq: Every day | ORAL | Status: DC
  Administered 2018-05-07 – 2018-05-19 (×12): 75 mg via ORAL
  Filled 2018-05-06 (×13): qty 1

## 2018-05-06 MED ORDER — SODIUM CHLORIDE 0.9% FLUSH
3.0000 mL | Freq: Two times a day (BID) | INTRAVENOUS | Status: DC
  Administered 2018-05-06 – 2018-05-19 (×22): 3 mL via INTRAVENOUS

## 2018-05-06 MED ORDER — ADULT MULTIVITAMIN W/MINERALS CH
1.0000 | ORAL_TABLET | Freq: Every day | ORAL | Status: DC
  Administered 2018-05-07 – 2018-05-19 (×11): 1 via ORAL
  Filled 2018-05-06 (×12): qty 1

## 2018-05-06 MED ORDER — VENLAFAXINE HCL 37.5 MG PO TABS
37.5000 mg | ORAL_TABLET | Freq: Every day | ORAL | Status: DC
  Administered 2018-05-07 – 2018-05-13 (×7): 37.5 mg via ORAL
  Filled 2018-05-06 (×11): qty 1

## 2018-05-06 MED ORDER — ACETAMINOPHEN 325 MG PO TABS
650.0000 mg | ORAL_TABLET | ORAL | Status: DC | PRN
  Administered 2018-05-07 – 2018-05-09 (×2): 650 mg via ORAL
  Filled 2018-05-06 (×2): qty 2

## 2018-05-06 MED ORDER — POTASSIUM CHLORIDE 10 MEQ/100ML IV SOLN: 10.0000 meq | Freq: Once | INTRAVENOUS | Status: DC

## 2018-05-06 MED ORDER — MIRABEGRON ER 25 MG PO TB24
25.0000 mg | ORAL_TABLET | Freq: Every day | ORAL | Status: DC
  Administered 2018-05-07 – 2018-05-19 (×12): 25 mg via ORAL
  Filled 2018-05-06 (×13): qty 1

## 2018-05-06 MED ORDER — SODIUM CHLORIDE 0.9% FLUSH
3.0000 mL | INTRAVENOUS | Status: DC | PRN
  Administered 2018-05-10: 3 mL via INTRAVENOUS
  Filled 2018-05-06: qty 3

## 2018-05-06 MED ORDER — BUPROPION HCL ER (XL) 150 MG PO TB24
150.0000 mg | ORAL_TABLET | Freq: Every day | ORAL | Status: DC
  Administered 2018-05-07 – 2018-05-19 (×12): 150 mg via ORAL
  Filled 2018-05-06 (×13): qty 1

## 2018-05-06 MED ORDER — LEVALBUTEROL HCL 1.25 MG/0.5ML IN NEBU
1.2500 mg | INHALATION_SOLUTION | Freq: Three times a day (TID) | RESPIRATORY_TRACT | Status: DC
  Administered 2018-05-07: 1.25 mg via RESPIRATORY_TRACT
  Filled 2018-05-06: qty 0.5

## 2018-05-06 MED ORDER — ASPIRIN EC 81 MG PO TBEC: 81.0000 mg | DELAYED_RELEASE_TABLET | Freq: Every day | ORAL | Status: DC

## 2018-05-06 MED ORDER — FUROSEMIDE 10 MG/ML IJ SOLN
40.0000 mg | Freq: Two times a day (BID) | INTRAMUSCULAR | Status: DC
  Administered 2018-05-07 – 2018-05-10 (×6): 40 mg via INTRAVENOUS
  Filled 2018-05-06 (×7): qty 4

## 2018-05-06 MED ORDER — HYDRALAZINE HCL 20 MG/ML IJ SOLN: 5.0000 mg | INTRAMUSCULAR | Status: DC | PRN

## 2018-05-06 NOTE — H&P (Addendum)
History and Physical    [name] DGL:[id number] DOB: 04-Dec-1950 DOA: 05/06/2018  Referring MD/NP/PA:   PCP: Harlan Stains, MD   Patient coming from:  The patient is coming from SNF  At baseline, pt is dependent for most of ADL.        Chief Complaint: Shortness of breath, bilateral leg edema  HPI: Shannon Carroll is a 68 y.o. female with medical history significant of hypertension, hyperlipidemia, diet-controlled diabetes, stroke, depression, anxiety, CKD-3, dCHF, OSA not on CPAP, mitral valve stenosis, aortic valve stenosis, who presents with shortness of breath and bilateral leg edema.  Patient states that she has been having shortness of breath for more than 1 week, which has been progressively getting worse, particularly on exertion.  She also has worsening bilateral lower leg edema recently.  She has mild dry cough, but no fever, chills, chest pain.  Patient was seen by cardiologist nurse practitioner on 3/16, and was put on oral Lasix without significant help.  Patient does not have nausea, vomiting, diarrhea, abdominal pain, symptoms of UTI or new unilateral weakness.  ED Course: pt was found to have BNP 1137, WBC 7.2, negative troponin, potassium 3.2, renal function close to baseline, no fever, has tachycardia, has tachypnea, oxygen saturation 92 to 94% on room air.  Chest x-ray showed cardiomegaly, vascular congestion and interstitial lung disease.  Patient is admitted to telemetry bed as inpatient.  Review of Systems:   General: no fevers, chills, has poor appetite, has fatigue HEENT: no blurry vision, hearing changes or sore throat Respiratory: has dyspnea, coughing, no wheezing CV: no chest pain, no palpitations GI: no nausea, vomiting, abdominal pain, diarrhea, constipation GU: no dysuria, burning on urination, increased urinary frequency, hematuria  Ext: has leg edema Neuro: no unilateral weakness, numbness, or tingling, no vision change or hearing loss Skin: no rash,  no skin tear. MSK: No muscle spasm, no deformity, no limitation of range of movement in spin Heme: No easy bruising.  Travel history: No recent long distant travel.  Allergy:  Allergies  Allergen Reactions  . Lisinopril Cough    Past Medical History:  Diagnosis Date  . Adenomatous polyps 12/14   f/u 5 yrs dr Penelope Coop  . Arthritis   . Chronic kidney disease    stage 2  . Depression   . Diabetes mellitus without complication ([place])   . Hirsutism   . Hyperlipidemia   . Hypertension   . Murmur   . Obesity   . Situational stress   . Stroke [facility])     Past Surgical History:  Procedure Laterality Date  . CESAREAN SECTION  12/23/1980  . COLONOSCOPY  06/2005-09/2007  . DILATION AND CURETTAGE OF UTERUS    . RIGHT/LEFT HEART CATH AND CORONARY ANGIOGRAPHY N/A 02/04/2018   Procedure: RIGHT/LEFT HEART CATH AND CORONARY ANGIOGRAPHY;  Surgeon: Nelva Bush, MD;  Location: [place] [facility];  Service: Cardiovascular;  Laterality: N/A;  . TONSILLECTOMY AND ADENOIDECTOMY  1956    Social History:  reports that she quit smoking about 24 years ago. She has never used smokeless tobacco. She reports current alcohol use. She reports that she does not use drugs.  Family History:  Family History  Problem Relation Age of Onset  . Heart failure Mother   . Hypertension Mother   . Diabetes Mother   . Heart attack Father   . Skin cancer Father   . Hypertension Father   . CAD Father   . Hypertension Sister   . Diabetes  Sister   . Hypertension Brother   . Diabetes Brother      Prior to Admission medications   Medication Sig Start Date End Date Taking? Authorizing Provider  amLODipine (NORVASC) 2.5 MG tablet Take 1 tablet (2.5 mg total) by mouth daily. 04/28/18 07/27/18  Lendon Colonel, NP  atorvastatin (LIPITOR) 40 MG tablet Take 40 mg by mouth daily.    [provider]  buPROPion (WELLBUTRIN XL) 150 MG 24 hr tablet Take 150 mg by mouth daily.    [provider]   clonazePAM (KLONOPIN) 0.5 MG tablet Take 0.5 mg by mouth 2 (two) times daily as needed for anxiety.    [provider]  clopidogrel (PLAVIX) 75 MG tablet Take 1 tablet (75 mg total) by mouth daily. 03/24/18   Garvin Fila, MD  furosemide (LASIX) 20 MG tablet Take 20 mg by mouth daily as needed for fluid or edema.    [provider]  iron polysaccharides (NIFEREX) 150 MG capsule Take 150 mg by mouth daily.    [provider]  mirabegron ER (MYRBETRIQ) 25 MG TB24 tablet Take 25 mg by mouth daily.    [provider]  Multiple Vitamins-Minerals (CENTRUM SILVER PO) Take by mouth.    [provider]  nystatin (NYSTATIN) powder Apply 1 g topically 2 (two) times daily as needed (rash).    [provider]  omeprazole (PRILOSEC) 40 MG capsule TAKE (1) CAPSULE BY MOUTH AT BEDTIME. 04/15/18   Lendon Colonel, NP  valsartan-hydrochlorothiazide (DIOVAN-HCT) 320-25 MG tablet Take 1 tablet by mouth daily.    [provider]  venlafaxine (EFFEXOR) 37.5 MG tablet Take 37.5-75 mg by mouth See admin instructions. Take 2 tablets (112.5 mg) by mouth in the morning & take 1 tablet (37.5 mg) by mouth at night.    [provider]    Physical Exam: Vitals:   05/06/18 1900 05/06/18 1915 05/06/18 2000 05/06/18 2030  BP: (!) 151/62 128/69 129/78 114/62  Pulse: (!) 102 (!) 102 (!) 108 100  Resp: (!) 30 (!) 23 (!) 25 (!) 23  Temp:      TempSrc:      SpO2: 97% 97% 95% 95%   General: Not in acute distress HEENT:       Eyes: PERRL, EOMI, no scleral icterus.       ENT: No discharge from the ears and nose, no pharynx injection, no tonsillar enlargement.        Neck: Difficult to assess JVD due to obesity. no bruit, no mass felt. Heme: No neck lymph node enlargement. Cardiac: V4/U9, RRR, 2/6 systolic murmurs, No gallops or rubs. Respiratory:  No rales, wheezing, rhonchi or rubs. GI: Soft, nondistended, nontender, no rebound pain, no organomegaly,  BS present. GU: No hematuria Ext: 2+ pitting leg edema bilaterally. 2+DP/PT pulse bilaterally. Musculoskeletal: No joint deformities, No joint redness or warmth, no limitation of ROM in spin. Skin: No rashes.  Neuro: Alert, oriented X3, cranial nerves II-XII grossly intact, moves all extremities normally.  Psych: Patient is not psychotic, no suicidal or hemocidal ideation.  Labs on Admission: I have personally reviewed following labs and imaging studies  CBC: Recent Labs  Lab 05/06/18 1752  WBC 7.2  NEUTROABS 5.7  HGB 9.3*  HCT 32.4*  MCV 91.3  PLT 811   Basic Metabolic Panel: Recent Labs  Lab 05/04/18 0953 05/06/18 1752 05/06/18 1920  NA 144 141  --   K 4.2 3.2*  --   CL 104 104  --  CO2 21 25  --   GLUCOSE 83 115*  --   BUN 13 10  --   CREATININE 1.06* 1.32*  --   CALCIUM 9.1 8.5*  --   MG  --   --  1.8   GFR: Estimated Creatinine Clearance: 58.7 mL/min (A) (by C-G formula based on SCr of 1.32 mg/dL (H)). Liver Function Tests: Recent Labs  Lab 05/06/18 1752  AST 29  ALT 18  ALKPHOS 78  BILITOT 1.0  PROT 6.5  ALBUMIN 3.0*   No results for input(s): LIPASE, AMYLASE in the last 168 hours. No results for input(s): AMMONIA in the last 168 hours. Coagulation Profile: No results for input(s): INR, PROTIME in the last 168 hours. Cardiac Enzymes: No results for input(s): CKTOTAL, CKMB, CKMBINDEX, TROPONINI in the last 168 hours. BNP (last 3 results) No results for input(s): PROBNP in the last 8760 hours. HbA1C: No results for input(s): HGBA1C in the last 72 hours. CBG: No results for input(s): GLUCAP in the last 168 hours. Lipid Profile: No results for input(s): CHOL, HDL, LDLCALC, TRIG, CHOLHDL, LDLDIRECT in the last 72 hours. Thyroid Function Tests: No results for input(s): TSH, T4TOTAL, FREET4, T3FREE, THYROIDAB in the last 72 hours. Anemia Panel: No results for input(s): VITAMINB12, FOLATE, FERRITIN, TIBC, IRON, RETICCTPCT in the last 72 hours. Urine  analysis:    Component Value Date/Time   APPEARANCEUR Cloudy (A) 03/04/2018 1123   GLUCOSEU Negative 03/04/2018 1123   BILIRUBINUR Negative 03/04/2018 1123   PROTEINUR 2+ (A) 03/04/2018 1123   NITRITE Positive (A) 03/04/2018 1123   LEUKOCYTESUR 3+ (A) 03/04/2018 1123   Sepsis Labs: @LABRCNTIP (procalcitonin:4,lacticidven:4) )No results found for this or any previous visit (from the past 240 hour(s)).   Radiological Exams on Admission: Dg Chest Port 1 View  Result Date: 05/06/2018 CLINICAL DATA:  Dyspnea. Bilateral leg swelling. EXAM: PORTABLE CHEST 1 VIEW COMPARISON:  01/21/2018. FINDINGS: Poor inspiration. No gross change in borderline enlargement of the cardiac silhouette and mild prominence of the pulmonary vasculature and interstitial markings. No visible pleural fluid. Thoracic spine degenerative changes. IMPRESSION: Stable borderline cardiomegaly, mild pulmonary vascular congestion and mild chronic interstitial lung disease. Electronically Signed   By: Claudie Revering M.D.   On: 05/06/2018 18:07     EKG: Independently reviewed.  Sinus rhythm, QTC 509, LAE.   Assessment/Plan Principal Problem:   Acute on chronic diastolic CHF (congestive heart failure) (HCC) Active Problems:   Hyperlipidemia   Depression   Hypertension   Stroke ([place])   CKD (chronic kidney disease), stage III (HCC)   Normocytic anemia   Hypokalemia   Acute on chronic diastolic (congestive) heart failure (HCC)   Acute on chronic diastolic CHF (congestive heart failure) [facility]): Patient has elevated BNP 1137, 2+ leg edema, vascular congestion on chest x-ray, consistent with CHF exacerbation.  2D echo on 01/29/2018 showed EF 65-70% with grade 2 diastolic dysfunction.  Patient has mild oxygen desaturation to 92% on room air.  -Admit to telemetry bed as inpatient - IV Lasix, 40 mg twice daily -Low-salt diet, fluid restriction - Daily body weight -2D echo -PRN Xopenex nebulizers for shortness of breath and  Mucinex for cough  Hyperlipidemia: -Lipitor  Depression and anxiety -Continue home Wellbutrin, Klonopin  HTN:  -Continue home medications: Amlodipine, diovan-HCTZ -On IV Lasix -IV hydralazine prn  Hx of Stroke [facility]): -Continue Lipitor and Plavix  CKD (chronic kidney disease), stage III ([place]): Renal function close to baseline.  Baseline creatinine 1.0-1.3.  Her creatinine is 1.32, BUN 10. -  Follow-up renal function by BMP  Normocytic anemia: Hemoglobin 11.5 on 01/30/2018--> 9.3 today.  No bleeding tendency. -Continue iron supplement -Check anemia panel  Hypokalemia: K= 3.2 on admission. - Repleted - Check Mg level      Inpatient status:  # Patient requires inpatient status due to high intensity of service, high risk for further deterioration and high frequency of surveillance required.  I certify that at the point of admission it is my clinical judgment that the patient will require inpatient hospital care spanning beyond 2 midnights from the point of admission.  . This patient has multiple chronic comorbidities including hypertension, hyperlipidemia, diet-controlled diabetes, stroke, depression, anxiety, CKD-3, dCHF, OSA not on CPAP, mitral valve stenosis, aortic valve stenosis . Now patient has presenting with CHF exacerbation . The worrisome physical exam findings include leg edema and 2/6 systolic murmur . The initial radiographic and laboratory data are worrisome because of elevated BNP 1137, vascular congestion on chest x-ray . Current medical needs: please see my assessment and plan . Predictability of an adverse outcome (risk): Patient has a multiple comorbidities, now presents with CHF exacerbation.  Patient has 2+ leg edema with elevated BNP 1137.  Patient also has mild oxygen desaturation to 92% on room air.  Patient is at high risk of deteriorating.  Will need to be treated in hospital for at least 2 days.   DVT ppx: SQ Lovenox Code Status: Full code Family  Communication: Yes, patient's care giver  at bed side Disposition Plan:  Anticipate discharge back to previous SNF Consults called:  none Admission status:  Inpatient/tele    Date of Service 05/06/2018    [facility]   If 7PM-7AM, please contact night-coverage [email or web address] Password [facility] 05/06/2018, 9:01 PM

## 2018-05-06 NOTE — ED Notes (Addendum)
Purewick applied to Pt at this time.

## 2018-05-06 NOTE — ED Triage Notes (Signed)
Pt in from [facility] via [facility], sent by cardiologist for abnormal labwork, told to come to ED right away. Hx of CHF, takes Lasix. States she has had incr BLE edema x 1 wk, sob with exertion.

## 2018-05-06 NOTE — ED Provider Notes (Signed)
[facility] EMERGENCY DEPARTMENT Provider Note   CSN: [id number] Arrival date & time: 05/06/18  1719    History   Chief Complaint No chief complaint on file.   HPI [facility] is a 68 y.o. female.     68 year old female with prior medical history as detailed below presents for evaluation of shortness of breath.  Patient reports that she has been recently started on outpatient diuretics for increased lower extremity edema.  She reports over the last several days increasing dyspnea especially with exertion.  She denies chest pain.  She denies fever.  She denies cough or other complaint.  She reports that her outpatient diuretics are not improving her symptoms.  The history is provided by the patient and medical records.  Shortness of Breath  Severity:  Moderate Onset quality:  Gradual Duration:  5 days Timing:  Constant Progression:  Worsening Chronicity:  New Relieved by:  Diuretics Worsened by:  Nothing Ineffective treatments:  None tried   Past Medical History:  Diagnosis Date  . Adenomatous polyps 12/14   f/u 5 yrs dr Penelope Coop  . Arthritis   . Chronic kidney disease    stage 2  . Depression   . Diabetes mellitus without complication ([place])   . Hirsutism   . Hyperlipidemia   . Hypertension   . Murmur   . Obesity   . Situational stress   . Stroke [facility])     Patient Active Problem List   Diagnosis Date Noted  . Acute on chronic diastolic CHF (congestive heart failure) ([place]) 05/06/2018  . CKD (chronic kidney disease), stage III ([place]) 05/06/2018  . Normocytic anemia 05/06/2018  . Hyperlipidemia   . Depression   . Hypertension   . Stroke ([place])   . Shortness of breath 02/04/2018  . Mitral valve stenosis 01/30/2018  . Aortic valve stenosis 01/30/2018  . Pre-operative laboratory examination 01/30/2018  . Sleep apnea 01/30/2018  . Snoring 01/30/2018    Past Surgical History:  Procedure Laterality Date  . CESAREAN SECTION  12/23/1980  .  COLONOSCOPY  06/2005-09/2007  . DILATION AND CURETTAGE OF UTERUS    . RIGHT/LEFT HEART CATH AND CORONARY ANGIOGRAPHY N/A 02/04/2018   Procedure: RIGHT/LEFT HEART CATH AND CORONARY ANGIOGRAPHY;  Surgeon: Nelva Bush, MD;  Location: [facility];  Service: Cardiovascular;  Laterality: N/A;  . TONSILLECTOMY AND ADENOIDECTOMY  1956     OB History   No obstetric history on file.      Home Medications    Prior to Admission medications   Medication Sig Start Date End Date Taking? Authorizing Provider  amLODipine (NORVASC) 2.5 MG tablet Take 1 tablet (2.5 mg total) by mouth daily. 04/28/18 07/27/18  Lendon Colonel, NP  atorvastatin (LIPITOR) 40 MG tablet Take 40 mg by mouth daily.    [provider]  buPROPion (WELLBUTRIN XL) 150 MG 24 hr tablet Take 150 mg by mouth daily.    [provider]  clonazePAM (KLONOPIN) 0.5 MG tablet Take 0.5 mg by mouth 2 (two) times daily as needed for anxiety.    [provider]  clopidogrel (PLAVIX) 75 MG tablet Take 1 tablet (75 mg total) by mouth daily. 03/24/18   Garvin Fila, MD  furosemide (LASIX) 20 MG tablet Take 20 mg by mouth daily as needed for fluid or edema.    [provider]  iron polysaccharides (NIFEREX) 150 MG capsule Take 150 mg by mouth daily.    [provider]  mirabegron  ER (MYRBETRIQ) 25 MG TB24 tablet Take 25 mg by mouth daily.    [provider]  Multiple Vitamins-Minerals (CENTRUM SILVER PO) Take by mouth.    [provider]  nystatin (NYSTATIN) powder Apply 1 g topically 2 (two) times daily as needed (rash).    [provider]  omeprazole (PRILOSEC) 40 MG capsule TAKE (1) CAPSULE BY MOUTH AT BEDTIME. 04/15/18   Lendon Colonel, NP  valsartan-hydrochlorothiazide (DIOVAN-HCT) 320-25 MG tablet Take 1 tablet by mouth daily.    [provider]  venlafaxine (EFFEXOR) 37.5 MG tablet Take 37.5-75 mg by mouth See admin instructions. Take 2 tablets  (112.5 mg) by mouth in the morning & take 1 tablet (37.5 mg) by mouth at night.    [provider]    Family History Family History  Problem Relation Age of Onset  . Heart failure Mother   . Hypertension Mother   . Diabetes Mother   . Heart attack Father   . Skin cancer Father   . Hypertension Father   . CAD Father   . Hypertension Sister   . Diabetes Sister   . Hypertension Brother   . Diabetes Brother     Social History Social History   Tobacco Use  . Smoking status: Former Smoker    Last attempt to quit: 07/13/1993    Years since quitting: 24.8  . Smokeless tobacco: Never Used  Substance Use Topics  . Alcohol use: Yes    Comment: 2 glasses per year  . Drug use: No     Allergies   Lisinopril   Review of Systems Review of Systems  Respiratory: Positive for shortness of breath.   All other systems reviewed and are negative.    Physical Exam Updated Vital Signs BP 125/72   Pulse 97   Temp (!) 97.5 F (36.4 C) (Oral)   Resp (!) 25   SpO2 94%   Physical Exam Vitals signs and nursing note reviewed.  Constitutional:      General: She is not in acute distress.    Appearance: Normal appearance. She is well-developed.  HENT:     Head: Normocephalic and atraumatic.  Eyes:     Conjunctiva/sclera: Conjunctivae normal.     Pupils: Pupils are equal, round, and reactive to light.  Neck:     Musculoskeletal: Normal range of motion and neck supple.  Cardiovascular:     Rate and Rhythm: Normal rate and regular rhythm.     Heart sounds: Normal heart sounds.  Pulmonary:     Effort: Pulmonary effort is normal. No respiratory distress.     Comments: Decreased breath sounds at bilateral bases Abdominal:     General: There is no distension.     Palpations: Abdomen is soft.     Tenderness: There is no abdominal tenderness.  Musculoskeletal: Normal range of motion.        General: No deformity.     Right lower leg: Edema present.     Left lower leg: Edema  present.  Skin:    General: Skin is warm and dry.  Neurological:     Mental Status: She is alert and oriented to person, place, and time.      ED Treatments / Results  Labs (all labs ordered are listed, but only abnormal results are displayed) Labs Reviewed  COMPREHENSIVE METABOLIC PANEL - Abnormal; Notable for the following components:      Result Value   Potassium 3.2 (*)    Glucose, Bld 115 (*)  Creatinine, Ser 1.32 (*)    Calcium 8.5 (*)    Albumin 3.0 (*)    GFR calc non Af Amer 42 (*)    GFR calc Af Amer 48 (*)    All other components within normal limits  BRAIN NATRIURETIC PEPTIDE - Abnormal; Notable for the following components:   B Natriuretic Peptide 1,077.4 (*)    All other components within normal limits  CBC WITH DIFFERENTIAL/PLATELET - Abnormal; Notable for the following components:   RBC 3.55 (*)    Hemoglobin 9.3 (*)    HCT 32.4 (*)    MCHC 28.7 (*)    RDW 17.6 (*)    All other components within normal limits  MAGNESIUM  URINALYSIS, ROUTINE W REFLEX MICROSCOPIC  HIV ANTIBODY (ROUTINE TESTING W REFLEX)  BASIC METABOLIC PANEL  VITAMIN D35  FOLATE  IRON AND TIBC  FERRITIN  RETICULOCYTES  I-STAT TROPONIN, ED    EKG None  Radiology Dg Chest Port 1 View  Result Date: 05/06/2018 CLINICAL DATA:  Dyspnea. Bilateral leg swelling. EXAM: PORTABLE CHEST 1 VIEW COMPARISON:  01/21/2018. FINDINGS: Poor inspiration. No gross change in borderline enlargement of the cardiac silhouette and mild prominence of the pulmonary vasculature and interstitial markings. No visible pleural fluid. Thoracic spine degenerative changes. IMPRESSION: Stable borderline cardiomegaly, mild pulmonary vascular congestion and mild chronic interstitial lung disease. Electronically Signed   By: Claudie Revering M.D.   On: 05/06/2018 18:07    Procedures Procedures (including critical care time)  Medications Ordered in ED Medications  furosemide (LASIX) injection 40 mg (has no  administration in time range)  potassium chloride 10 mEq in 100 mL IVPB (has no administration in time range)     Initial Impression / Assessment and Plan / ED Course  I have reviewed the triage vital signs and the nursing notes.  Pertinent labs & imaging results that were available during my care of the patient were reviewed by me and considered in my medical decision making (see chart for details).        MDM  Screen complete  Patient is presenting for evaluation of shortness of breath.  This appears to be likely related to CHF exacerbation.  Screening labs obtained support this diagnosis with an elevated BNP.  Patient appears to be appropriate for admission.  She has been given a dose of IV Lasix in the ED for diuresis.  Hospitalist service is aware of case and will evaluate for admission.  Final Clinical Impressions(s) / ED Diagnoses   Final diagnoses:  Acute on chronic congestive heart failure, unspecified heart failure type [facility])    ED Discharge Orders    None       Valarie Merino, MD 05/06/18 2013

## 2018-05-06 NOTE — Progress Notes (Addendum)
Pt has been admitted to unit 3east for SOB and bilateral leg edema. Pt is alert and oriented. Pt is getting SOB with exertion. Skin assessment complete. Pt skin is red on right lower extremity with cracking. Pt has unna boot on left lower extremity. Pt states her facility put it on three days ago. Pt states she wants for either the MD or wound nurse to wait to take it off to do the assessment underneath. Initial weight is bed because pt and pt caregiver states pt is not able to walk or stand on legs because they are too swollen, red and hurt pt. Pt in no acute distress. Vitals stable. Will continue to monitor.

## 2018-05-06 NOTE — Plan of Care (Signed)
  Problem: Education: Goal: Ability to demonstrate management of disease process will improve Outcome: Progressing Goal: Ability to verbalize understanding of medication therapies will improve Outcome: Progressing   Problem: Activity: Goal: Capacity to carry out activities will improve Outcome: Progressing   Problem: Cardiac: Goal: Ability to achieve and maintain adequate cardiopulmonary perfusion will improve Outcome: Progressing   Problem: Education: Goal: Knowledge of General Education information will improve Description Including pain rating scale, medication(s)/side effects and non-pharmacologic comfort measures Outcome: Progressing   Problem: Health Behavior/Discharge Planning: Goal: Ability to manage health-related needs will improve Outcome: Progressing   Problem: Clinical Measurements: Goal: Ability to maintain clinical measurements within normal limits will improve Outcome: Progressing Goal: Will remain free from infection Outcome: Progressing Goal: Cardiovascular complication will be avoided Outcome: Progressing   Problem: Nutrition: Goal: Adequate nutrition will be maintained Outcome: Progressing   Problem: Coping: Goal: Level of anxiety will decrease Outcome: Progressing   Problem: Elimination: Goal: Will not experience complications related to bowel motility Outcome: Progressing Goal: Will not experience complications related to urinary retention Outcome: Progressing   Problem: Pain Managment: Goal: General experience of comfort will improve Outcome: Progressing   Problem: Safety: Goal: Ability to remain free from injury will improve Outcome: Progressing   Problem: Skin Integrity: Goal: Risk for impaired skin integrity will decrease Outcome: Progressing

## 2018-05-07 ENCOUNTER — Inpatient Hospital Stay (HOSPITAL_COMMUNITY): Payer: Medicare Other

## 2018-05-07 DIAGNOSIS — I5033 Acute on chronic diastolic (congestive) heart failure: Secondary | ICD-10-CM

## 2018-05-07 LAB — BASIC METABOLIC PANEL
Anion gap: 9 (ref 5–15)
BUN / CREAT RATIO: 12 (ref 12–28)
BUN: 12 mg/dL (ref 8–23)
BUN: 13 mg/dL (ref 8–27)
CALCIUM: 8.3 mg/dL — AB (ref 8.9–10.3)
CO2: 27 mmol/L (ref 22–32)
CO2: 21 mmol/L (ref 20–29)
Calcium: 9.1 mg/dL (ref 8.7–10.3)
Chloride: 105 mmol/L (ref 98–111)
Chloride: 104 mmol/L (ref 96–106)
Creatinine, Ser: 1.25 mg/dL — ABNORMAL HIGH (ref 0.44–1.00)
Creatinine, Ser: 1.06 mg/dL — ABNORMAL HIGH (ref 0.57–1.00)
GFR calc Af Amer: 52 mL/min — ABNORMAL LOW (ref >60)
GFR calc non Af Amer: 44 mL/min — ABNORMAL LOW (ref >60)
GFR calc non Af Amer: 54 mL/min/{1.73_m2} — ABNORMAL LOW (ref >59)
GFR, EST AFRICAN AMERICAN: 63 mL/min/{1.73_m2} (ref >59)
Glucose, Bld: 98 mg/dL (ref 70–99)
Glucose: 83 mg/dL (ref 65–99)
Potassium: 3.3 mmol/L — ABNORMAL LOW (ref 3.5–5.1)
Potassium: 4.2 mmol/L (ref 3.5–5.2)
SODIUM: 144 mmol/L (ref 134–144)
Sodium: 141 mmol/L (ref 135–145)

## 2018-05-07 LAB — BRAIN NATRIURETIC PEPTIDE: BNP: 1137.1 pg/mL — ABNORMAL HIGH (ref 0.0–100.0)

## 2018-05-07 LAB — HIV ANTIBODY (ROUTINE TESTING W REFLEX): HIV Screen 4th Generation wRfx: NONREACTIVE

## 2018-05-07 LAB — RETICULOCYTES
Immature Retic Fract: 19.8 % — ABNORMAL HIGH (ref 2.3–15.9)
RBC.: 3.37 MIL/uL — ABNORMAL LOW (ref 3.87–5.11)
Retic Count, Absolute: 59.6 10*3/uL (ref 19.0–186.0)
Retic Ct Pct: 1.8 % (ref 0.4–3.1)

## 2018-05-07 LAB — IRON AND TIBC
Iron: 21 ug/dL — ABNORMAL LOW (ref 28–170)
Saturation Ratios: 9 % — ABNORMAL LOW (ref 10.4–31.8)
TIBC: 221 ug/dL — AB (ref 250–450)
UIBC: 200 ug/dL

## 2018-05-07 LAB — ECHOCARDIOGRAM COMPLETE
Height: 68 in
Weight: 4948.8 oz

## 2018-05-07 LAB — GLUCOSE, CAPILLARY: Glucose-Capillary: 84 mg/dL (ref 70–99)

## 2018-05-07 LAB — FERRITIN: FERRITIN: 77 ng/mL (ref 11–307)

## 2018-05-07 LAB — FOLATE: Folate: 21.7 ng/mL (ref >5.9)

## 2018-05-07 LAB — VITAMIN B12: Vitamin B-12: 686 pg/mL (ref 180–914)

## 2018-05-07 LAB — MRSA PCR SCREENING: MRSA by PCR: NEGATIVE

## 2018-05-07 MED ORDER — POTASSIUM CHLORIDE 20 MEQ/15ML (10%) PO SOLN
40.0000 meq | Freq: Once | ORAL | Status: DC
  Filled 2018-05-07: qty 30

## 2018-05-07 MED ORDER — LEVALBUTEROL HCL 1.25 MG/0.5ML IN NEBU
1.2500 mg | INHALATION_SOLUTION | Freq: Two times a day (BID) | RESPIRATORY_TRACT | Status: DC
  Administered 2018-05-07 – 2018-05-08 (×3): 1.25 mg via RESPIRATORY_TRACT
  Filled 2018-05-07 (×3): qty 0.5

## 2018-05-07 MED ORDER — PERFLUTREN LIPID MICROSPHERE
INTRAVENOUS | Status: AC
  Filled 2018-05-07: qty 10

## 2018-05-07 NOTE — Progress Notes (Signed)
  Echocardiogram 2D Echocardiogram has been performed.  Jannett Celestine 05/07/2018, 12:13 PM

## 2018-05-07 NOTE — Consult Note (Signed)
[facility] Nurse wound consult note Reason for Consult: Patient with Unna's Boots on LLE, was wearing them bilaterally at facility where she resides until recently. Full thickness wound on left lateral LE with green drainage Wound type:venous insufficiency Pressure Injury POA: NA Measurement: 6cm x 4cm x 0.1cm shallow, moist with pink granulation tissue evident Wound bed:See above Drainage (amount, consistency, odor) Moderate amount green tinged drainage Periwound: RLE with edema, LLE with maceration Dressing procedure/placement/frequency: I will implement a silver hydrofiber dressing topped with silicone foam for added compression prior to placement of bilateral Unna's Boots by ortho tech today. These will be changed twice weekly on Mondays and Thursdays while in house.  [facility] nursing team will not follow, but will remain available to this patient, the nursing and medical teams.  Please re-consult if needed. Thanks, Maudie Flakes, MSN, RN, [place], Arther Abbott  Pager# [phone]

## 2018-05-07 NOTE — Progress Notes (Signed)
RN put wound consult in for the wound nurse or MD to come take unna boot off and assess skin underneath per pt request.

## 2018-05-07 NOTE — Progress Notes (Signed)
[facility]  PROGRESS NOTE  Shannon Carroll GHW:[id number] DOB: [birth year] DOA: 05/06/2018 PCP: Harlan Stains, MD   Brief HPI:   68 year old female with a history of hypertension, hyperlipidemia, diet-controlled diabetes mellitus, stroke, depression, CKD stage III, diastolic CHF, aortic valve stenosis came with shortness of breath and bilateral lower extremity edema.  Patient admitted with acute on chronic diastolic CHF.    Subjective   This morning patient is breathing better, diuresing well with IV Lasix.   Assessment/Plan:     1. Acute on chronic diastolic CHF-patient presented with dyspnea, leg edema, BNP 1137, vascular congestion on chest x-ray.  2D echo in December 2019 showed grade 2 diastolic dysfunction. --- Started on IV Lasix 40 mg twice daily, diuresing well, net -2.2 L.  Daily BMP.  Daily weights.  Strict intake and output.  2. Hyperlipidemia-continue Lipitor  3. Hypertension-blood pressure is stable.  Continue amlodipine  4. Depression/anxiety-continue Wellbutrin, Klonopin  5. History of stroke-at baseline, continue Lipitor, Plavix  6. CKD stage III-creatinine at baseline 1.0-1.3  7. Hypokalemia-potassium is 3.3, replace potassium and check BMP in a.m.  8. Normocytic anemia-B12 686, folate 21.7, iron 21, TIBC 221, iron saturation 9%, ferritin 77.  Continue iron supplementation Niferex     CBG: Recent Labs  Lab 05/07/18 0601  GLUCAP 84    CBC: Recent Labs  Lab 05/06/18 1752  WBC 7.2  NEUTROABS 5.7  HGB 9.3*  HCT 32.4*  MCV 91.3  PLT 789    Basic Metabolic Panel: Recent Labs  Lab 05/04/18 0953 05/06/18 1752 05/06/18 1920 05/07/18 0036  NA 144 141  --  141  K 4.2 3.2*  --  3.3*  CL 104 104  --  105  CO2 21 25  --  27  GLUCOSE 83 115*  --  98  BUN 13 10  --  12  CREATININE 1.06* 1.32*  --  1.25*  CALCIUM 9.1 8.5*  --  8.3*  MG  --   --  1.8  --      DVT prophylaxis: Lovenox  Code Status: Full code  Family Communication:  Discussed with patient's caregiver at bedside  Disposition Plan: likely skilled nursing facility in 3 to 4 days     Consultants:    Procedures:     Antibiotics:   Anti-infectives (From admission, onward)   None       Objective   Vitals:   05/07/18 0430 05/07/18 0806 05/07/18 0815 05/07/18 1213  BP: 105/87  129/72 123/83  Pulse: 90 96 (!) 101 95  Resp: 18 18 (!) 22 18  Temp: (!) 97.4 F (36.3 C)   98.2 F (36.8 C)  TempSrc: Oral   Oral  SpO2: 100% 100% 100% 100%  Weight:      Height:        Intake/Output Summary (Last 24 hours) at 05/07/2018 1308 Last data filed at 05/07/2018 1015 Gross per 24 hour  Intake 963 ml  Output 3200 ml  Net -2237 ml   Filed Weights   05/06/18 2104 05/07/18 0334  Weight: (!) 143.3 kg (!) 140.3 kg     Physical Examination:    General: Appears in no acute distress  Cardiovascular: S1-S2, regular  Respiratory: Bibasilar crackles auscultated  Abdomen: Abdomen is soft, nontender, no organomegaly  Extremities: Trace edema and erythema noted in the right lower extremity, brawny induration left lower extremity  Neurologic: Alert, oriented x3, no focal deficit noted.     Data Reviewed: I have personally reviewed following  labs and imaging studies   Recent Results (from the past 240 hour(s))  MRSA PCR Screening     Status: None   Collection Time: 05/06/18 10:29 PM  Result Value Ref Range Status   MRSA by PCR NEGATIVE NEGATIVE Final    Comment:        The GeneXpert MRSA Assay (FDA approved for NASAL specimens only), is one component of a comprehensive MRSA colonization surveillance program. It is not intended to diagnose MRSA infection nor to guide or monitor treatment for MRSA infections. Performed at [facility], [place] [address] [place], [place] [address]      Liver Function Tests: Recent Labs  Lab 05/06/18 1752  AST 29  ALT 18  ALKPHOS 78  BILITOT 1.0  PROT 6.5  ALBUMIN 3.0*   No results for  input(s): LIPASE, AMYLASE in the last 168 hours. No results for input(s): AMMONIA in the last 168 hours.  Cardiac Enzymes: No results for input(s): CKTOTAL, CKMB, CKMBINDEX, TROPONINI in the last 168 hours. BNP (last 3 results) Recent Labs    05/04/18 0953 05/06/18 1752  BNP 1,137.1* 1,077.4*    ProBNP (last 3 results) No results for input(s): PROBNP in the last 8760 hours.    Studies: Dg Chest Port 1 View  Result Date: 05/06/2018 CLINICAL DATA:  Dyspnea. Bilateral leg swelling. EXAM: PORTABLE CHEST 1 VIEW COMPARISON:  01/21/2018. FINDINGS: Poor inspiration. No gross change in borderline enlargement of the cardiac silhouette and mild prominence of the pulmonary vasculature and interstitial markings. No visible pleural fluid. Thoracic spine degenerative changes. IMPRESSION: Stable borderline cardiomegaly, mild pulmonary vascular congestion and mild chronic interstitial lung disease. Electronically Signed   By: Claudie Revering M.D.   On: 05/06/2018 18:07    Scheduled Meds: . perflutren lipid microspheres (DEFINITY) IV suspension      . amLODipine  2.5 mg Oral Daily  . atorvastatin  40 mg Oral Daily  . buPROPion  150 mg Oral Daily  . clopidogrel  75 mg Oral Daily  . enoxaparin (LOVENOX) injection  40 mg Subcutaneous Q24H  . furosemide  40 mg Intravenous Q12H  . iron polysaccharides  150 mg Oral Daily  . levalbuterol  1.25 mg Nebulization BID  . mirabegron ER  25 mg Oral Daily  . multivitamin with minerals  1 tablet Oral Daily  . pantoprazole  40 mg Oral Daily  . sodium chloride flush  3 mL Intravenous Q12H  . venlafaxine  112.5 mg Oral Q breakfast   And  . venlafaxine  37.5 mg Oral QAC supper    Admission status: Inpatient: Based on patients clinical presentation and evaluation of above clinical data, I have made determination that patient meets Inpatient criteria at this time.  Time spent: 30 min  [facility] Pager [phone]. If 7PM-7AM, please  contact night-coverage at [email or web address], Office  [phone]  password [email or web address]  05/07/2018, 1:08 PM  LOS: 1 day

## 2018-05-08 ENCOUNTER — Encounter (HOSPITAL_COMMUNITY): Payer: Self-pay | Admitting: General Practice

## 2018-05-08 LAB — BASIC METABOLIC PANEL
Anion gap: 11 (ref 5–15)
BUN: 10 mg/dL (ref 8–23)
CO2: 32 mmol/L (ref 22–32)
Calcium: 8.4 mg/dL — ABNORMAL LOW (ref 8.9–10.3)
Chloride: 98 mmol/L (ref 98–111)
Creatinine, Ser: 1.39 mg/dL — ABNORMAL HIGH (ref 0.44–1.00)
GFR calc Af Amer: 45 mL/min — ABNORMAL LOW (ref >60)
GFR calc non Af Amer: 39 mL/min — ABNORMAL LOW (ref >60)
Glucose, Bld: 116 mg/dL — ABNORMAL HIGH (ref 70–99)
Potassium: 2.8 mmol/L — ABNORMAL LOW (ref 3.5–5.1)
Sodium: 141 mmol/L (ref 135–145)

## 2018-05-08 MED ORDER — POTASSIUM CHLORIDE 10 MEQ/100ML IV SOLN
10.0000 meq | INTRAVENOUS | Status: AC
  Administered 2018-05-08 (×4): 10 meq via INTRAVENOUS
  Filled 2018-05-08 (×4): qty 100

## 2018-05-08 MED ORDER — LEVALBUTEROL HCL 1.25 MG/0.5ML IN NEBU: 1.2500 mg | INHALATION_SOLUTION | Freq: Four times a day (QID) | RESPIRATORY_TRACT | Status: DC | PRN

## 2018-05-08 NOTE — Progress Notes (Signed)
Orthopedic Tech Progress Note Patient Details:  Shannon Carroll November 08, 1950 [id number]   Ortho Devices Type of Ortho Device: Louretta Parma boot Ortho Device/Splint Interventions: Ordered, Application, Adjustment   Post Interventions Patient Tolerated: Well Instructions Provided: Adjustment of device, Care of device   Ciarah Peace J Wilburt Messina 05/08/2018, 1:10 PM

## 2018-05-08 NOTE — TOC Initial Note (Addendum)
Transition of Care [facility]) - Initial/Assessment Note    Patient Details  Name: Shannon Carroll MRN: [id number] Date of Birth: 10/08/1950  Transition of Care [facility]) CM/SW Contact:    Shannon Chroman, LCSW Phone Number: 05/08/2018, 1:08 PM  Clinical Narrative:  CSW met with patient. Private duty sitter at bedside. CSW introduced role and explained that discharge planning would be discussed. Patient confirmed she is from [facility] and plans to return at discharge. No further concerns. CSW encouraged patient to contact CSW as needed. CSW will continue to follow patient for support and facilitate discharge back to ALF once medically stable.    3:34 pm: [facility] sent CSW voicemail from [facility], SW with [facility] [phone]). She is a private pay Education officer, museum that assists with long-term planning needs and has been working with patient and her husband since December. She stated patient is not fully oriented and described her memory deficits. Plan is not for return to [facility] but family is hopeful for SNF placement until she has built her strength up enough to go to another ALF to be with her husband. Her husband is still currently at [facility]. Patient's husband does not want [facility] for SNF placement due to bad experience when he had previously been a patient there. Patient and her husband have two sons, Shannon Carroll and [facility]. Shannon Carroll lives in [place] and McNeil lives in [place]. They recently completed POA paperwork and share decision-making capabilities. [facility] will email me this paperwork as well as a release of information. MD will enter PT and OT orders so she can be evaluated for SNF placement.         Expected Discharge Plan: Assisted Living Barriers to Discharge: Continued Medical Work up   Patient Goals and CMS Choice        Expected Discharge Plan and Services Expected Discharge Plan: Assisted Living       Living arrangements for the past 2 months: [facility]                          Prior Living Arrangements/Services Living arrangements for the past 2 months: [facility] Lives with:: Facility Resident Patient language and need for interpreter reviewed:: No Do you feel safe going back to the place where you live?: Yes      Need for Family Participation in Patient Care: Yes (Comment) Care giver support system in place?: Yes (comment)(ALF staff, private duty sitter.) Current home services: Probation officer Activity/Legal Involvement Pertinent to Current Situation/Hospitalization: No - Comment as needed  Activities of Daily Living Home Assistive Devices/Equipment: Wheelchair, Environmental consultant (specify type) ADL Screening (condition at time of admission) Patient's cognitive ability adequate to safely complete daily activities?: Yes Is the patient deaf or have difficulty hearing?: No Does the patient have difficulty seeing, even when wearing glasses/contacts?: No Does the patient have difficulty concentrating, remembering, or making decisions?: No Patient able to express need for assistance with ADLs?: Yes Does the patient have difficulty dressing or bathing?: Yes Independently performs ADLs?: Yes (appropriate for developmental age) Does the patient have difficulty walking or climbing stairs?: Yes Weakness of Legs: Both Weakness of Arms/Hands: None  Permission Sought/Granted Permission sought to share information with : Facility Art therapist granted to share information with : Yes, Verbal Permission Granted     Permission granted to share info w AGENCY: [facility]        Emotional Assessment Appearance::  Appears stated age Attitude/Demeanor/Rapport: Engaged, Gracious Affect (typically observed): Accepting, Appropriate, Calm, Pleasant Orientation: : Oriented to Self, Oriented to Place, Oriented to  Time, Oriented to Situation Alcohol / Substance Use: Never Used Psych Involvement: No  (comment)  Admission diagnosis:  Acute on chronic congestive heart failure, unspecified heart failure type [facility]) [I50.9] Patient Active Problem List   Diagnosis Date Noted  . Acute on chronic diastolic CHF (congestive heart failure) ([facility]) 05/06/2018  . CKD (chronic kidney disease), stage III ([place]) 05/06/2018  . Normocytic anemia 05/06/2018  . Hypokalemia 05/06/2018  . Acute on chronic diastolic (congestive) heart failure ([place]) 05/06/2018  . Hyperlipidemia   . Depression   . Hypertension   . Stroke ([facility])   . Shortness of breath 02/04/2018  . Mitral valve stenosis 01/30/2018  . Aortic valve stenosis 01/30/2018  . Pre-operative laboratory examination 01/30/2018  . Sleep apnea 01/30/2018  . Snoring 01/30/2018   PCP:  Shannon Stains, MD Pharmacy:   [id number] [id number]: [phone]: [phone]    Social Determinants of [place] (SDOH) Interventions    Readmission Risk Interventions No flowsheet data found.

## 2018-05-08 NOTE — Progress Notes (Signed)
[facility]  PROGRESS NOTE  Shannon Carroll FBP:[id number] DOB: [birth year] DOA: 05/06/2018 PCP: Harlan Stains, MD   Brief HPI:   68 year old female with a history of hypertension, hyperlipidemia, diet-controlled diabetes mellitus, stroke, depression, CKD stage III, diastolic CHF, aortic valve stenosis came with shortness of breath and bilateral lower extremity edema.  Patient admitted with acute on chronic diastolic CHF.    Subjective   Patient seen and examined, diuresing well with IV Lasix.   Assessment/Plan:     1. Acute on chronic diastolic CHF-patient presented with dyspnea, leg edema, BNP 1137, vascular congestion on chest x-ray.  2D echo in December 2019 showed grade 2 diastolic dysfunction. -  Started on IV Lasix 40 mg twice daily, diuresing well, net - 6.4 L.  Daily BMP.  Daily weights.  Strict intake and output.  2. Hyperlipidemia-continue Lipitor  3. Hypertension-blood pressure is stable.  Continue amlodipine  4. Depression/anxiety-continue Wellbutrin, Klonopin  5. History of stroke-at baseline, continue Lipitor, Plavix  6. CKD stage III-creatinine at baseline 1.0-1.3  7. Hypokalemia-potassium is 2.8 from aggressive diuresis.  Replace with KCl 10 mEq IV x4.  Follow BMP in a.m.  8. Normocytic anemia-B12 686, folate 21.7, iron 21, TIBC 221, iron saturation 9%, ferritin 77.  Continue iron supplementation Niferex     CBG: Recent Labs  Lab 05/07/18 0601  GLUCAP 84    CBC: Recent Labs  Lab 05/06/18 1752  WBC 7.2  NEUTROABS 5.7  HGB 9.3*  HCT 32.4*  MCV 91.3  PLT 824    Basic Metabolic Panel: Recent Labs  Lab 05/04/18 0953 05/06/18 1752 05/06/18 1920 05/07/18 0036 05/08/18 0515  NA 144 141  --  141 141  K 4.2 3.2*  --  3.3* 2.8*  CL 104 104  --  105 98  CO2 21 25  --  27 32  GLUCOSE 83 115*  --  98 116*  BUN 13 10  --  12 10  CREATININE 1.06* 1.32*  --  1.25* 1.39*  CALCIUM 9.1 8.5*  --  8.3* 8.4*  MG  --   --  1.8  --   --      DVT  prophylaxis: Lovenox  Code Status: Full code  Family Communication: Discussed with patient's caregiver at bedside  Disposition Plan: likely skilled nursing facility in 3 to 4 days     Consultants:    Procedures:     Antibiotics:   Anti-infectives (From admission, onward)   None       Objective   Vitals:   05/08/18 0544 05/08/18 0745 05/08/18 0813 05/08/18 1235  BP: 111/84  (!) 155/66 (!) 133/105  Pulse: 74  93 85  Resp: 18  20 20   Temp: 98 F (36.7 C)  (!) 97.5 F (36.4 C) 97.6 F (36.4 C)  TempSrc: Oral  Oral Oral  SpO2: 98% 95% 97% 95%  Weight:      Height:        Intake/Output Summary (Last 24 hours) at 05/08/2018 1453 Last data filed at 05/08/2018 1221 Gross per 24 hour  Intake 480 ml  Output 4000 ml  Net -3520 ml   Filed Weights   05/06/18 2104 05/07/18 0334 05/08/18 0100  Weight: (!) 143.3 kg (!) 140.3 kg (!) 138 kg     Physical Examination:   General: Appears in no acute distress  Cardiovascular: S1-S2, regular  Respiratory: Bibasilar crackles  Abdomen: Abdomen is soft, nontender, no organomegaly  Extremities: Both lower extremity in Unna boots  Neurologic: Somnolent but arousable, moving all extremities      Data Reviewed: I have personally reviewed following labs and imaging studies   Recent Results (from the past 240 hour(s))  MRSA PCR Screening     Status: None   Collection Time: 05/06/18 10:29 PM  Result Value Ref Range Status   MRSA by PCR NEGATIVE NEGATIVE Final    Comment:        The GeneXpert MRSA Assay (FDA approved for NASAL specimens only), is one component of a comprehensive MRSA colonization surveillance program. It is not intended to diagnose MRSA infection nor to guide or monitor treatment for MRSA infections. Performed at [facility], [place] [address], [place], [place] [address]      Liver Function Tests: Recent Labs  Lab 05/06/18 1752  AST 29  ALT 18  ALKPHOS 78  BILITOT 1.0  PROT  6.5  ALBUMIN 3.0*   No results for input(s): LIPASE, AMYLASE in the last 168 hours. No results for input(s): AMMONIA in the last 168 hours.  Cardiac Enzymes: No results for input(s): CKTOTAL, CKMB, CKMBINDEX, TROPONINI in the last 168 hours. BNP (last 3 results) Recent Labs    05/04/18 0953 05/06/18 1752  BNP 1,137.1* 1,077.4*    ProBNP (last 3 results) No results for input(s): PROBNP in the last 8760 hours.    Studies: Dg Chest Port 1 View  Result Date: 05/06/2018 CLINICAL DATA:  Dyspnea. Bilateral leg swelling. EXAM: PORTABLE CHEST 1 VIEW COMPARISON:  01/21/2018. FINDINGS: Poor inspiration. No gross change in borderline enlargement of the cardiac silhouette and mild prominence of the pulmonary vasculature and interstitial markings. No visible pleural fluid. Thoracic spine degenerative changes. IMPRESSION: Stable borderline cardiomegaly, mild pulmonary vascular congestion and mild chronic interstitial lung disease. Electronically Signed   By: Claudie Revering M.D.   On: 05/06/2018 18:07    Scheduled Meds: . amLODipine  2.5 mg Oral Daily  . atorvastatin  40 mg Oral Daily  . buPROPion  150 mg Oral Daily  . clopidogrel  75 mg Oral Daily  . enoxaparin (LOVENOX) injection  40 mg Subcutaneous Q24H  . furosemide  40 mg Intravenous Q12H  . iron polysaccharides  150 mg Oral Daily  . levalbuterol  1.25 mg Nebulization BID  . mirabegron ER  25 mg Oral Daily  . multivitamin with minerals  1 tablet Oral Daily  . pantoprazole  40 mg Oral Daily  . potassium chloride  40 mEq Oral Once  . sodium chloride flush  3 mL Intravenous Q12H  . venlafaxine  112.5 mg Oral Q breakfast   And  . venlafaxine  37.5 mg Oral QAC supper    Admission status: Inpatient: Based on patients clinical presentation and evaluation of above clinical data, I have made determination that patient meets Inpatient criteria at this time.  Time spent: 30 min  [facility] Pager [phone]. If  7PM-7AM, please contact night-coverage at [email or web address], Office  [phone]  password [email or web address]  05/08/2018, 2:53 PM  LOS: 2 days

## 2018-05-09 LAB — BASIC METABOLIC PANEL
Anion gap: 12 (ref 5–15)
BUN: 11 mg/dL (ref 8–23)
CALCIUM: 8.4 mg/dL — AB (ref 8.9–10.3)
CO2: 34 mmol/L — ABNORMAL HIGH (ref 22–32)
Chloride: 92 mmol/L — ABNORMAL LOW (ref 98–111)
Creatinine, Ser: 1.22 mg/dL — ABNORMAL HIGH (ref 0.44–1.00)
GFR calc Af Amer: 53 mL/min — ABNORMAL LOW (ref >60)
GFR calc non Af Amer: 46 mL/min — ABNORMAL LOW (ref >60)
Glucose, Bld: 120 mg/dL — ABNORMAL HIGH (ref 70–99)
Potassium: 2.8 mmol/L — ABNORMAL LOW (ref 3.5–5.1)
Sodium: 138 mmol/L (ref 135–145)

## 2018-05-09 MED ORDER — POTASSIUM CHLORIDE 10 MEQ/100ML IV SOLN
10.0000 meq | INTRAVENOUS | Status: AC
  Administered 2018-05-09 (×4): 10 meq via INTRAVENOUS
  Filled 2018-05-09 (×4): qty 100

## 2018-05-09 MED ORDER — POTASSIUM CHLORIDE CRYS ER 20 MEQ PO TBCR
20.0000 meq | EXTENDED_RELEASE_TABLET | Freq: Two times a day (BID) | ORAL | Status: DC
  Administered 2018-05-09 – 2018-05-19 (×19): 20 meq via ORAL
  Filled 2018-05-09: qty 2
  Filled 2018-05-09 (×4): qty 1
  Filled 2018-05-09: qty 2
  Filled 2018-05-09 (×2): qty 1
  Filled 2018-05-09: qty 2
  Filled 2018-05-09 (×3): qty 1
  Filled 2018-05-09 (×4): qty 2
  Filled 2018-05-09 (×2): qty 1
  Filled 2018-05-09 (×3): qty 2

## 2018-05-09 MED ORDER — POTASSIUM CHLORIDE 20 MEQ/15ML (10%) PO SOLN: 20.0000 meq | Freq: Two times a day (BID) | ORAL | Status: DC

## 2018-05-09 NOTE — Progress Notes (Signed)
[facility]  PROGRESS NOTE  Shannon Carroll NKN:[id number] DOB: [birth year] DOA: 05/06/2018 PCP: Harlan Stains, MD   Brief HPI:   68 year old female with a history of hypertension, hyperlipidemia, diet-controlled diabetes mellitus, stroke, depression, CKD stage III, diastolic CHF, aortic valve stenosis came with shortness of breath and bilateral lower extremity edema.  Patient admitted with acute on chronic diastolic CHF.   Subjective   Patient seen and examined, diuresing well with IV Lasix.  Denies shortness of breath.   Assessment/Plan:     1. Acute on chronic diastolic CHF-improving, patient presented with dyspnea, leg edema, BNP 1137, vascular congestion on chest x-ray.  2D echo in December 2019 showed grade 2 diastolic dysfunction. -  Started on IV Lasix 40 mg twice daily, diuresing well, net -8.1 L.  Daily BMP.  Daily weights.  Strict intake and output.  2. Hyperlipidemia-continue Lipitor  3. Hypertension-blood pressure is stable.  Continue amlodipine  4. Depression/anxiety-continue Wellbutrin, Klonopin  5. History of stroke-at baseline, continue Lipitor, Plavix  6. CKD stage III-creatinine at baseline 1.0-1.3  7. Hypokalemia-potassium is 2.8 from aggressive diuresis.  Replace with KCl 10 mEq IV x3.  Follow BMP in a.m.  8. Normocytic anemia-B12 686, folate 21.7, iron 21, TIBC 221, iron saturation 9%, ferritin 77.  Continue iron supplementation Niferex     CBG: Recent Labs  Lab 05/07/18 0601  GLUCAP 84    CBC: Recent Labs  Lab 05/06/18 1752  WBC 7.2  NEUTROABS 5.7  HGB 9.3*  HCT 32.4*  MCV 91.3  PLT 379    Basic Metabolic Panel: Recent Labs  Lab 05/04/18 0953 05/06/18 1752 05/06/18 1920 05/07/18 0036 05/08/18 0515 05/09/18 0306  NA 144 141  --  141 141 138  K 4.2 3.2*  --  3.3* 2.8* 2.8*  CL 104 104  --  105 98 92*  CO2 21 25  --  27 32 34*  GLUCOSE 83 115*  --  98 116* 120*  BUN 13 10  --  12 10 11   CREATININE 1.06* 1.32*  --  1.25*  1.39* 1.22*  CALCIUM 9.1 8.5*  --  8.3* 8.4* 8.4*  MG  --   --  1.8  --   --   --      DVT prophylaxis: Lovenox  Code Status: Full code  Family Communication: Discussed with patient's caregiver at bedside  Disposition Plan: likely skilled nursing facility in 3 to 4 days     Consultants:    Procedures:     Antibiotics:   Anti-infectives (From admission, onward)   None       Objective   Vitals:   05/08/18 2106 05/09/18 0516 05/09/18 1100 05/09/18 1234  BP: 117/73 124/85  104/73  Pulse: 97 98  93  Resp: 18 18  19   Temp: 97.8 F (36.6 C) 97.7 F (36.5 C)  97.7 F (36.5 C)  TempSrc: Oral Oral  Oral  SpO2: 97% 97% 95% 98%  Weight:  135.4 kg    Height:        Intake/Output Summary (Last 24 hours) at 05/09/2018 1312 Last data filed at 05/09/2018 0600 Gross per 24 hour  Intake 558.11 ml  Output 2300 ml  Net -1741.89 ml   Filed Weights   05/07/18 0334 05/08/18 0100 05/09/18 0516  Weight: (!) 140.3 kg (!) 138 kg 135.4 kg     Physical Examination:  General: Appears in no acute distress Cardiovascular: S1-S2, regular Respiratory: Clear to auscultation bilaterally Abdomen: Abdomen is soft,  nontender, no organomegaly Extremities: Both lower extremities in Unna boots Neurologic: Alert, oriented x3, no focal deficit noted.        Data Reviewed: I have personally reviewed following labs and imaging studies   Recent Results (from the past 240 hour(s))  MRSA PCR Screening     Status: None   Collection Time: 05/06/18 10:29 PM  Result Value Ref Range Status   MRSA by PCR NEGATIVE NEGATIVE Final    Comment:        The GeneXpert MRSA Assay (FDA approved for NASAL specimens only), is one component of a comprehensive MRSA colonization surveillance program. It is not intended to diagnose MRSA infection nor to guide or monitor treatment for MRSA infections. Performed at [facility], [place] [address], [place], [place] [address]      Liver  Function Tests: Recent Labs  Lab 05/06/18 1752  AST 29  ALT 18  ALKPHOS 78  BILITOT 1.0  PROT 6.5  ALBUMIN 3.0*   No results for input(s): LIPASE, AMYLASE in the last 168 hours. No results for input(s): AMMONIA in the last 168 hours.  Cardiac Enzymes: No results for input(s): CKTOTAL, CKMB, CKMBINDEX, TROPONINI in the last 168 hours. BNP (last 3 results) Recent Labs    05/04/18 0953 05/06/18 1752  BNP 1,137.1* 1,077.4*    ProBNP (last 3 results) No results for input(s): PROBNP in the last 8760 hours.    Studies: No results found.  Scheduled Meds: . amLODipine  2.5 mg Oral Daily  . atorvastatin  40 mg Oral Daily  . buPROPion  150 mg Oral Daily  . clopidogrel  75 mg Oral Daily  . enoxaparin (LOVENOX) injection  40 mg Subcutaneous Q24H  . furosemide  40 mg Intravenous Q12H  . iron polysaccharides  150 mg Oral Daily  . mirabegron ER  25 mg Oral Daily  . multivitamin with minerals  1 tablet Oral Daily  . pantoprazole  40 mg Oral Daily  . potassium chloride  40 mEq Oral Once  . potassium chloride  20 mEq Oral BID  . sodium chloride flush  3 mL Intravenous Q12H  . venlafaxine  112.5 mg Oral Q breakfast   And  . venlafaxine  37.5 mg Oral QAC supper    Admission status: Inpatient: Based on patients clinical presentation and evaluation of above clinical data, I have made determination that patient meets Inpatient criteria at this time.  Time spent: 30 min  [facility] Pager [phone]. If 7PM-7AM, please contact night-coverage at [email or web address], Office  [phone]  password [email or web address]  05/09/2018, 1:12 PM  LOS: 3 days

## 2018-05-09 NOTE — Evaluation (Signed)
Physical Therapy Evaluation Patient Details Name: NATALYE KOTT MRN: [id number] DOB: 31-Mar-1950 Today's Date: 05/09/2018   History of Present Illness  68 year old female with a history of hypertension, hyperlipidemia, diet-controlled diabetes mellitus, stroke, depression, CKD stage III, diastolic CHF, aortic valve stenosis came with shortness of breath and bilateral lower extremity edema.  Patient admitted with acute on chronic diastolic CHF.  Clinical Impression  Orders received for PT evaluation. Patient demonstrates deficits in functional mobility as indicated below. Will benefit from continued skilled PT to address deficits and maximize function. Will see as indicated and progress as tolerated.  At this time, feel patient will benefit from SNF upon acute discharge and patient requires 2 person physical assist.    Follow Up Recommendations SNF;Supervision for mobility/OOB    Equipment Recommendations  None recommended by PT    Recommendations for Other Services       Precautions / Restrictions Precautions Precautions: Fall Restrictions Weight Bearing Restrictions: No      Mobility  Bed Mobility Overal bed mobility: Needs Assistance Bed Mobility: Supine to Sit     Supine to sit: Mod assist;+2 for safety/equipment;HOB elevated     General bed mobility comments: Moderate assist to come to EOB, increased posterior bias and poor ability to rotate trunk at EOB.  Transfers Overall transfer level: Needs assistance Equipment used: Rolling walker (2 wheeled) Transfers: Sit to/from Stand Sit to Stand: Mod assist;+2 physical assistance;From elevated surface         General transfer comment: HOB highest position, increased time to come to EOB with multi modal cues for LE positoning prior to power up. Momentum technique utilized. Significant posterior lean.  Ambulation/Gait Ambulation/Gait assistance: Max assist;+2 safety/equipment Gait Distance (Feet): 4 Feet Assistive  device: Rolling walker (2 wheeled) Gait Pattern/deviations: Step-to pattern;Shuffle;Decreased weight shift to right;Decreased weight shift to left;Wide base of support;Leaning posteriorly Gait velocity: decrease Gait velocity interpretation: <1.31 ft/sec, indicative of household ambulator General Gait Details: signficant posterior lean, poor ability to maintain upright despite multi modal cues. Fatigues easily. Heavy relaince on external assist. Unable to initiate stride or weight shift and therefore required manual facilitation.  Stairs            Wheelchair Mobility    Modified Rankin (Stroke Patients Only) Modified Rankin (Stroke Patients Only) Pre-Morbid Rankin Score: Moderately severe disability Modified Rankin: Severe disability     Balance Overall balance assessment: Needs assistance Sitting-balance support: Bilateral upper extremity supported Sitting balance-Leahy Scale: Poor Sitting balance - Comments: significant posterior lean, unable to maintain sitting balance without UE support or external assist. Postural control: Posterior lean Standing balance support: No upper extremity supported Standing balance-Leahy Scale: Poor Standing balance comment: heavy reliance on RW                             Pertinent Vitals/Pain Pain Assessment: No/denies pain    Home Living Family/patient expects to be discharged to:: Assisted living Living Arrangements: Other (Comment)([place]) Available Help at Discharge: Personal care attendant(at assisted living) Type of Home: Assisted living Home Access: Level entry     Home Layout: One level Home Equipment: Walker - 4 wheels;Shower seat;Grab bars - toilet;Grab bars - tub/shower;Bedside commode      Prior Function Level of Independence: Needs assistance   Gait / Transfers Assistance Needed: RW or wheel chair for mobility  ADL's / Homemaking Assistance Needed: assist for bathing and dressing  Comments:  caregiver assist  Hand Dominance   Dominant Hand: Right    Extremity/Trunk Assessment   Upper Extremity Assessment Upper Extremity Assessment: Generalized weakness    Lower Extremity Assessment Lower Extremity Assessment: Generalized weakness    Cervical / Trunk Assessment Cervical / Trunk Assessment: (increased body habitus)  Communication   Communication: No difficulties  Cognition Arousal/Alertness: Awake/alert Behavior During Therapy: WFL for tasks assessed/performed Overall Cognitive Status: No family/caregiver present to determine baseline cognitive functioning                                        General Comments      Exercises     Assessment/Plan    PT Assessment Patient needs continued PT services  PT Problem List Decreased strength;Decreased range of motion;Decreased activity tolerance;Decreased balance;Decreased mobility;Decreased coordination;Decreased cognition;Decreased safety awareness;Obesity;Cardiopulmonary status limiting activity       PT Treatment Interventions DME instruction;Stair training;Functional mobility training;Therapeutic activities;Therapeutic exercise;Balance training;Neuromuscular re-education;Patient/family education;Manual techniques    PT Goals (Current goals can be found in the Care Plan section)  Acute Rehab PT Goals Patient Stated Goal: to get stronger PT Goal Formulation: With patient Time For Goal Achievement: 05/23/18 Potential to Achieve Goals: Fair    Frequency Min 2X/week   Barriers to discharge        Co-evaluation PT/OT/SLP Co-Evaluation/Treatment: Yes Reason for Co-Treatment: Complexity of the patient's impairments (multi-system involvement);For patient/therapist safety PT goals addressed during session: Mobility/safety with mobility OT goals addressed during session: ADL's and self-care       AM-PAC PT "6 Clicks" Mobility  Outcome Measure Help needed turning from your back to your  side while in a flat bed without using bedrails?: A Lot Help needed moving from lying on your back to sitting on the side of a flat bed without using bedrails?: A Lot Help needed moving to and from a bed to a chair (including a wheelchair)?: A Lot Help needed standing up from a chair using your arms (e.g., wheelchair or bedside chair)?: A Lot Help needed to walk in hospital room?: A Lot Help needed climbing 3-5 steps with a railing? : Total 6 Click Score: 11    End of Session Equipment Utilized During Treatment: Oxygen Activity Tolerance: Patient limited by fatigue Patient left: in chair;with call bell/phone within reach(private caregiver present but new to patient) Nurse Communication: Mobility status PT Visit Diagnosis: Muscle weakness (generalized) (M62.81);Difficulty in walking, not elsewhere classified (R26.2)    Time: 2633-3545 PT Time Calculation (min) (ACUTE ONLY): 24 min   Charges:   PT Evaluation $PT Eval Moderate Complexity: 1 Mod          Alben Deeds, PT DPT  Board Certified Neurologic Specialist Acute Rehabilitation Services Pager [phone] Office [place] 05/09/2018, 9:45 AM

## 2018-05-09 NOTE — NC FL2 (Signed)
[place] LEVEL OF CARE SCREENING TOOL     IDENTIFICATION  Patient Name: Shannon Carroll Birthdate: Jul 13, 1950 Sex: female Admission Date (Current Location): 05/06/2018  [facility] and [place] Number:  Herbalist and Address:  [facility] [place], [place] [address], [place], [place] [address]      Provider Number: [id number]  Attending Physician Name and Address:  Oswald Hillock, MD  Relative Name and Phone Number:       Current Level of Care: Hospital Recommended Level of Care: [place] Prior Approval Number:    Date Approved/Denied:   PASRR Number: [id number]  Discharge Plan: SNF    Current Diagnoses: Patient Active Problem List   Diagnosis Date Noted  . Acute on chronic diastolic CHF (congestive heart failure) ([place]) 05/06/2018  . CKD (chronic kidney disease), stage III ([place]) 05/06/2018  . Normocytic anemia 05/06/2018  . Hypokalemia 05/06/2018  . Acute on chronic diastolic (congestive) heart failure ([place]) 05/06/2018  . Hyperlipidemia   . Depression   . Hypertension   . Stroke ([place])   . Shortness of breath 02/04/2018  . Mitral valve stenosis 01/30/2018  . Aortic valve stenosis 01/30/2018  . Pre-operative laboratory examination 01/30/2018  . Sleep apnea 01/30/2018  . Snoring 01/30/2018    Orientation RESPIRATION BLADDER Height & Weight     Self, Time, Situation, Place  O2(Nasal Cannula 2L) External catheter, Incontinent(placed 05/06/18) Weight: 298 lb 8.1 oz (135.4 kg) Height:  5\' 8"  (172.7 cm)  BEHAVIORAL SYMPTOMS/MOOD NEUROLOGICAL BOWEL NUTRITION STATUS      Incontinent Diet(Heart healthy, thin liquids, Fluid restriction: 1500 mL Fluid )  AMBULATORY STATUS COMMUNICATION OF NEEDS Skin   Extensive Assist Verbally Normal                       Personal Care Assistance Level of Assistance  Dressing, Feeding, Bathing Bathing Assistance: Maximum assistance Feeding assistance: Independent Dressing  Assistance: Maximum assistance     Functional Limitations Info  Sight, Hearing, Speech Sight Info: Adequate Hearing Info: Adequate Speech Info: Adequate    SPECIAL CARE FACTORS FREQUENCY  PT (By licensed PT), OT (By licensed OT)     PT Frequency: 5x OT Frequency: 5x            Contractures Contractures Info: Not present    Additional Factors Info  Code Status, Allergies Code Status Info: Full Code Allergies Info: Lisinopril           Current Medications (05/09/2018):  This is the current hospital active medication list Current Facility-Administered Medications  Medication Dose Route Frequency Provider Last Rate Last Dose  . 0.9 %  sodium chloride infusion  250 mL Intravenous PRN Ivor Costa, MD      . acetaminophen (TYLENOL) tablet 650 mg  650 mg Oral Q4H PRN Ivor Costa, MD   650 mg at 05/07/18 1537  . amLODipine (NORVASC) tablet 2.5 mg  2.5 mg Oral Daily Ivor Costa, MD   2.5 mg at 05/09/18 0849  . atorvastatin (LIPITOR) tablet 40 mg  40 mg Oral Daily Ivor Costa, MD   40 mg at 05/09/18 0850  . buPROPion (WELLBUTRIN XL) 24 hr tablet 150 mg  150 mg Oral Daily Ivor Costa, MD   150 mg at 05/09/18 0851  . clonazePAM (KLONOPIN) tablet 0.5 mg  0.5 mg Oral BID PRN Ivor Costa, MD   0.5 mg at 05/07/18 1746  . clopidogrel (PLAVIX) tablet 75 mg  75 mg Oral  Daily Ivor Costa, MD   75 mg at 05/09/18 9794  . dextromethorphan-guaiFENesin (MUCINEX DM) 30-600 MG per 12 hr tablet 1 tablet  1 tablet Oral BID PRN Ivor Costa, MD   1 tablet at 05/07/18 1537  . enoxaparin (LOVENOX) injection 40 mg  40 mg Subcutaneous Q24H Ivor Costa, MD   40 mg at 05/08/18 2022  . furosemide (LASIX) injection 40 mg  40 mg Intravenous Huntley Dec, MD   40 mg at 05/09/18 0532  . hydrALAZINE (APRESOLINE) injection 5 mg  5 mg Intravenous Q2H PRN Ivor Costa, MD      . hydrOXYzine (ATARAX/VISTARIL) tablet 10 mg  10 mg Oral TID PRN Ivor Costa, MD      . iron polysaccharides (NIFEREX) capsule 150 mg  150 mg Oral Daily  Ivor Costa, MD   150 mg at 05/09/18 0851  . levalbuterol (XOPENEX) nebulizer solution 1.25 mg  1.25 mg Nebulization Q6H PRN Oswald Hillock, MD      . mirabegron ER [facility]) tablet 25 mg  25 mg Oral Daily Ivor Costa, MD   25 mg at 05/09/18 0849  . multivitamin with minerals tablet 1 tablet  1 tablet Oral Daily Ivor Costa, MD   1 tablet at 05/09/18 [phone]  . pantoprazole (PROTONIX) EC tablet 40 mg  40 mg Oral Daily Ivor Costa, MD   40 mg at 05/09/18 0850  . potassium chloride 10 mEq in 100 mL IVPB  10 mEq Intravenous Q1 Hr x 4 Kyere, Belinda K, NP 100 mL/hr at 05/09/18 1114 10 mEq at 05/09/18 1114  . potassium chloride 20 MEQ/15ML (10%) solution 40 mEq  40 mEq Oral Once [place], Marge Duncans, MD      . potassium chloride SA (K-DUR,KLOR-CON) CR tablet 20 mEq  20 mEq Oral BID Oswald Hillock, MD   20 mEq at 05/09/18 0944  . sodium chloride flush (NS) 0.9 % injection 3 mL  3 mL Intravenous Q12H Ivor Costa, MD   3 mL at 05/08/18 1018  . sodium chloride flush (NS) 0.9 % injection 3 mL  3 mL Intravenous PRN Ivor Costa, MD      . venlafaxine [facility]) tablet 112.5 mg  112.5 mg Oral Q breakfast Ivor Costa, MD   112.5 mg at 05/09/18 0850   And  . venlafaxine [facility]) tablet 37.5 mg  37.5 mg Oral QAC supper Ivor Costa, MD   37.5 mg at 05/08/18 1706     Discharge Medications: Please see discharge summary for a list of discharge medications.  Relevant Imaging Results:  Relevant Lab Results:   Additional Information SSN: [id number]  Eileen Stanford, LCSW

## 2018-05-09 NOTE — Progress Notes (Signed)
Patient experencing hot flashes with a increased heart rate 100-160s no sustaining, Potassium low received IV  And PO potassium replacement.  Patient back in bed with no distress.

## 2018-05-09 NOTE — Evaluation (Signed)
Occupational Therapy Evaluation Patient Details Name: Shannon Carroll MRN: [id number] DOB: 11/15/1950 Today's Date: 05/09/2018    History of Present Illness 68 year old female with a history of hypertension, hyperlipidemia, diet-controlled diabetes mellitus, stroke, depression, CKD stage III, diastolic CHF, aortic valve stenosis came with shortness of breath and bilateral lower extremity edema.  Patient admitted with acute on chronic diastolic CHF.   Clinical Impression   Pt could walk short distances with assist and a rollator PTA. She was assisted for bathing, dressing and toileting. Pt presents with decreased activity tolerance, poor sitting and standing balance, generalized weakness and impaired memory. She requires 2 person assist for OOB mobility. Recommending ST rehab in SNF. Will follow acutely.    Follow Up Recommendations  SNF;Supervision/Assistance - 24 hour    Equipment Recommendations  None recommended by OT    Recommendations for Other Services       Precautions / Restrictions Precautions Precautions: Fall Restrictions Weight Bearing Restrictions: No      Mobility Bed Mobility Overal bed mobility: Needs Assistance Bed Mobility: Supine to Sit     Supine to sit: Mod assist;+2 for safety/equipment;HOB elevated     General bed mobility comments: Moderate assist to come to EOB, increased posterior bias and poor ability to rotate trunk at EOB.  Transfers Overall transfer level: Needs assistance Equipment used: Rolling walker (2 wheeled) Transfers: Sit to/from Stand Sit to Stand: Mod assist;+2 physical assistance;From elevated surface         General transfer comment: HOB highest position, increased time to come to EOB with multi modal cues for LE positoning prior to power up. Momentum technique utilized. Significant posterior lean.    Balance Overall balance assessment: Needs assistance Sitting-balance support: Bilateral upper extremity supported Sitting  balance-Leahy Scale: Poor Sitting balance - Comments: significant posterior lean, unable to maintain sitting balance without UE support or external assist. Postural control: Posterior lean Standing balance support: Bilateral upper extremity supported Standing balance-Leahy Scale: Poor Standing balance comment: heavy reliance on RW, posterior lean                           ADL either performed or assessed with clinical judgement   ADL Overall ADL's : Needs assistance/impaired Eating/Feeding: Independent;Sitting   Grooming: Set up;Sitting;Oral care   Upper Body Bathing: Moderate assistance;Sitting   Lower Body Bathing: Total assistance;+2 for physical assistance;Sit to/from stand   Upper Body Dressing : Minimal assistance;Sitting   Lower Body Dressing: Total assistance;+2 for physical assistance;Sit to/from stand   Toilet Transfer: +2 for physical assistance;Maximal assistance;BSC;RW   Toileting- Clothing Manipulation and Hygiene: +2 for physical assistance;Total assistance;Sit to/from stand       Functional mobility during ADLs: +2 for physical assistance;Maximal assistance;Rolling walker       Vision Patient Visual Report: No change from baseline       Perception     Praxis      Pertinent Vitals/Pain Pain Assessment: No/denies pain     Hand Dominance Right   Extremity/Trunk Assessment Upper Extremity Assessment Upper Extremity Assessment: Generalized weakness   Lower Extremity Assessment Lower Extremity Assessment: Defer to PT evaluation   Cervical / Trunk Assessment Cervical / Trunk Assessment: Other exceptions Cervical / Trunk Exceptions: obesity   Communication Communication Communication: No difficulties   Cognition Arousal/Alertness: Awake/alert Behavior During Therapy: WFL for tasks assessed/performed Overall Cognitive Status: No family/caregiver present to determine baseline cognitive functioning  General Comments: pt with memory deficits, difficulty recalling past medical hx since her stroke and PLOF   General Comments       Exercises     Shoulder Instructions      Home Living Family/patient expects to be discharged to:: Assisted living Living Arrangements: Other (Comment)([facility]) Available Help at Discharge: Personal care attendant(at ALF) Type of Home: Assisted living Home Access: Level entry     Home Layout: One level         Bathroom Toilet: Standard     Home Equipment: Environmental consultant - 4 wheels;Shower seat;Grab bars - toilet;Grab bars - tub/shower;Bedside commode          Prior Functioning/Environment Level of Independence: Needs assistance  Gait / Transfers Assistance Needed: RW or wheel chair for mobility, RW short distances with help ADL's / Homemaking Assistance Needed: assist for bathing and dressing and all IADL   Comments: caregiver assist         OT Problem List: Decreased strength;Decreased activity tolerance;Impaired balance (sitting and/or standing);Decreased cognition;Decreased knowledge of use of DME or AE;Cardiopulmonary status limiting activity;Obesity;Increased edema      OT Treatment/Interventions: Self-care/ADL training;Neuromuscular education;DME and/or AE instruction;Patient/family education;Balance training;Cognitive remediation/compensation    OT Goals(Current goals can be found in the care plan section) Acute Rehab OT Goals Patient Stated Goal: to get stronger OT Goal Formulation: With patient Time For Goal Achievement: 05/23/18 Potential to Achieve Goals: Good ADL Goals Pt Will Transfer to Toilet: with mod assist;stand pivot transfer;bedside commode Pt/caregiver will Perform Home Exercise Program: Both right and left upper extremity;With theraband;With written HEP provided;With Supervision(level 2) Additional ADL Goal #1: Pt will perform bed mobility modified independently. Additional ADL Goal #2: Pt will demonstrate  fair sitting balance at EOB in preparation for ADL.  OT Frequency: Min 2X/week   Barriers to D/C:            Co-evaluation PT/OT/SLP Co-Evaluation/Treatment: Yes Reason for Co-Treatment: For patient/therapist safety PT goals addressed during session: Mobility/safety with mobility OT goals addressed during session: ADL's and self-care      AM-PAC OT "6 Clicks" Daily Activity     Outcome Measure Help from another person eating meals?: None Help from another person taking care of personal grooming?: A Little Help from another person toileting, which includes using toliet, bedpan, or urinal?: Total Help from another person bathing (including washing, rinsing, drying)?: A Lot Help from another person to put on and taking off regular upper body clothing?: A Little Help from another person to put on and taking off regular lower body clothing?: Total 6 Click Score: 14   End of Session Equipment Utilized During Treatment: Gait belt;Rolling walker;Oxygen(3L) Nurse Communication: Mobility status;Need for lift equipment  Activity Tolerance: Patient tolerated treatment well Patient left: in chair;with call bell/phone within reach;with chair alarm set;with nursing/sitter in room  OT Visit Diagnosis: Unsteadiness on feet (R26.81);Other abnormalities of gait and mobility (R26.89);Muscle weakness (generalized) (M62.81);Other symptoms and signs involving cognitive function                Time: 4315-4008 OT Time Calculation (min): 24 min Charges:  OT General Charges $OT Visit: 1 Visit OT Evaluation $OT Eval Moderate Complexity: 1 Mod  Nestor Lewandowsky, OTR/L Acute Rehabilitation Services Pager: [phone] Office: [phone]  Shannon Carroll 05/09/2018, 10:14 AM

## 2018-05-09 NOTE — Progress Notes (Signed)
   05/09/18 4255  Provider Notification  Provider Name/Title Hollace Hayward  Date Provider Notified 05/09/18  Time Provider Notified [phone]  Notification Type Page  Notification Reason Other (Comment) (K= 2.8)  Response Other (Comment) (awaiting call back)

## 2018-05-09 NOTE — Progress Notes (Signed)
Patient went into a panic attack when staff attempted to put her back in bed using stedy lift.  Heart rate went into 160s, patient became sweaty and began complaining of nausea.  Staff reassured patient.  Private duty nurse assistant in room, stated patient does experience anxiety attacks in the assisted living where she came from during transfers.  Patient requesting to be given time to recover before being put in bed.  Patient sitting comfortably in recliner at this time.  Marcille Blanco, RN

## 2018-05-10 DIAGNOSIS — I5033 Acute on chronic diastolic (congestive) heart failure: Secondary | ICD-10-CM

## 2018-05-10 LAB — BASIC METABOLIC PANEL
Anion gap: 12 (ref 5–15)
BUN: 14 mg/dL (ref 8–23)
CO2: 34 mmol/L — ABNORMAL HIGH (ref 22–32)
CREATININE: 1.21 mg/dL — AB (ref 0.44–1.00)
Calcium: 8.5 mg/dL — ABNORMAL LOW (ref 8.9–10.3)
Chloride: 91 mmol/L — ABNORMAL LOW (ref 98–111)
GFR calc Af Amer: 54 mL/min — ABNORMAL LOW (ref >60)
GFR calc non Af Amer: 46 mL/min — ABNORMAL LOW (ref >60)
Glucose, Bld: 105 mg/dL — ABNORMAL HIGH (ref 70–99)
Potassium: 3.2 mmol/L — ABNORMAL LOW (ref 3.5–5.1)
Sodium: 137 mmol/L (ref 135–145)

## 2018-05-10 LAB — MAGNESIUM: Magnesium: 2 mg/dL (ref 1.7–2.4)

## 2018-05-10 LAB — POTASSIUM: Potassium: 3.7 mmol/L (ref 3.5–5.1)

## 2018-05-10 LAB — GLUCOSE, CAPILLARY: Glucose-Capillary: 101 mg/dL — ABNORMAL HIGH (ref 70–99)

## 2018-05-10 MED ORDER — METOPROLOL TARTRATE 12.5 MG HALF TABLET
12.5000 mg | ORAL_TABLET | Freq: Two times a day (BID) | ORAL | Status: DC
  Administered 2018-05-10 (×2): 12.5 mg via ORAL
  Filled 2018-05-10 (×2): qty 1

## 2018-05-10 MED ORDER — POTASSIUM CHLORIDE 10 MEQ/100ML IV SOLN
10.0000 meq | INTRAVENOUS | Status: AC
  Administered 2018-05-10 (×2): 10 meq via INTRAVENOUS
  Filled 2018-05-10 (×2): qty 100

## 2018-05-10 MED ORDER — TORSEMIDE 20 MG PO TABS: 20.0000 mg | ORAL_TABLET | Freq: Every day | ORAL | Status: DC

## 2018-05-10 MED ORDER — ORAL CARE MOUTH RINSE
15.0000 mL | Freq: Two times a day (BID) | OROMUCOSAL | Status: DC
  Administered 2018-05-12 – 2018-05-18 (×6): 15 mL via OROMUCOSAL

## 2018-05-10 NOTE — Progress Notes (Signed)
[facility]  PROGRESS NOTE  Shannon Carroll WUJ:[id number] DOB: Jun 05, 1950 DOA: 05/06/2018 PCP: Harlan Stains, MD   Brief HPI:   68 year old female with a history of hypertension, hyperlipidemia, diet-controlled diabetes mellitus, stroke, depression, CKD stage III, diastolic CHF, aortic valve stenosis came with shortness of breath and bilateral lower extremity edema.  Patient admitted with acute on chronic diastolic CHF.   Subjective   Patient seen and examined, breathing better.   Assessment/Plan:     1. Acute on chronic diastolic CHF-improving, patient presented with dyspnea, leg edema, BNP 1137, vascular congestion on chest x-ray.  2D echo in December 2019 showed grade 2 diastolic dysfunction. -  Started on IV Lasix 40 mg twice daily, diuresing well, net - 9.1 L.  Daily BMP.  Daily weights.  Strict intake and output.  2. Hyperlipidemia-continue Lipitor  3. Hypertension-blood pressure is stable.  Continue amlodipine  4. Depression/anxiety-continue Wellbutrin, Klonopin  5. History of stroke-at baseline, continue Lipitor, Plavix  6. CKD stage III-creatinine at baseline 1.0-1.3  7. Hypokalemia-potassium is 3.2 this morning.  Replace potassium and check BMP in a.m.  8. Normocytic anemia-B12 686, folate 21.7, iron 21, TIBC 221, iron saturation 9%, ferritin 77.  Continue iron supplementation Niferex     CBG: Recent Labs  Lab 05/07/18 0601 05/10/18 0621  GLUCAP 84 101*    CBC: Recent Labs  Lab 05/06/18 1752  WBC 7.2  NEUTROABS 5.7  HGB 9.3*  HCT 32.4*  MCV 91.3  PLT 956    Basic Metabolic Panel: Recent Labs  Lab 05/06/18 1752 05/06/18 1920 05/07/18 0036 05/08/18 0515 05/09/18 0306 05/10/18 0439  NA 141  --  141 141 138 137  K 3.2*  --  3.3* 2.8* 2.8* 3.2*  CL 104  --  105 98 92* 91*  CO2 25  --  27 32 34* 34*  GLUCOSE 115*  --  98 116* 120* 105*  BUN 10  --  12 10 11 14   CREATININE 1.32*  --  1.25* 1.39* 1.22* 1.21*  CALCIUM 8.5*  --  8.3* 8.4*  8.4* 8.5*  MG  --  1.8  --   --   --   --      DVT prophylaxis: Lovenox  Code Status: Full code  Family Communication: Discussed with patient's caregiver at bedside  Disposition Plan: likely skilled nursing facility in am     Consultants:    Procedures:     Antibiotics:   Anti-infectives (From admission, onward)   None       Objective   Vitals:   05/09/18 1234 05/09/18 1851 05/09/18 1921 05/10/18 0500  BP: 104/73  (!) 123/92 110/74  Pulse: 93  99 86  Resp: 19  18 18   Temp: 97.7 F (36.5 C)  (!) 97 F (36.1 C) 98 F (36.7 C)  TempSrc: Oral   Oral  SpO2: 98% 98% 98% 96%  Weight:    133.6 kg  Height:        Intake/Output Summary (Last 24 hours) at 05/10/2018 1127 Last data filed at 05/10/2018 2130 Gross per 24 hour  Intake 769 ml  Output 1800 ml  Net -1031 ml   Filed Weights   05/08/18 0100 05/09/18 0516 05/10/18 0500  Weight: (!) 138 kg 135.4 kg 133.6 kg     Physical Examination:   General: Appears in no acute distress  Cardiovascular: S1-S2, regular  Respiratory: Clear to auscultation bilaterally  Abdomen: Abdomen is soft, nontender, no organomegaly  Extremities: Both lower extremities  in [place] boots  Neurologic: Alert, oriented x3, no focal deficit noted       Data Reviewed: I have personally reviewed following labs and imaging studies   Recent Results (from the past 240 hour(s))  MRSA PCR Screening     Status: None   Collection Time: 05/06/18 10:29 PM  Result Value Ref Range Status   MRSA by PCR NEGATIVE NEGATIVE Final    Comment:        The GeneXpert MRSA Assay (FDA approved for NASAL specimens only), is one component of a comprehensive MRSA colonization surveillance program. It is not intended to diagnose MRSA infection nor to guide or monitor treatment for MRSA infections. Performed at [facility], [place] [address], [place], [place] [address]      Liver Function Tests: Recent Labs  Lab 05/06/18 1752   AST 29  ALT 18  ALKPHOS 78  BILITOT 1.0  PROT 6.5  ALBUMIN 3.0*   No results for input(s): LIPASE, AMYLASE in the last 168 hours. No results for input(s): AMMONIA in the last 168 hours.  Cardiac Enzymes: No results for input(s): CKTOTAL, CKMB, CKMBINDEX, TROPONINI in the last 168 hours. BNP (last 3 results) Recent Labs    05/04/18 0953 05/06/18 1752  BNP 1,137.1* 1,077.4*    ProBNP (last 3 results) No results for input(s): PROBNP in the last 8760 hours.    Studies: No results found.  Scheduled Meds: . amLODipine  2.5 mg Oral Daily  . atorvastatin  40 mg Oral Daily  . buPROPion  150 mg Oral Daily  . clopidogrel  75 mg Oral Daily  . enoxaparin (LOVENOX) injection  40 mg Subcutaneous Q24H  . furosemide  40 mg Intravenous Q12H  . iron polysaccharides  150 mg Oral Daily  . mouth rinse  15 mL Mouth Rinse BID  . mirabegron ER  25 mg Oral Daily  . multivitamin with minerals  1 tablet Oral Daily  . pantoprazole  40 mg Oral Daily  . potassium chloride  40 mEq Oral Once  . potassium chloride  20 mEq Oral BID  . sodium chloride flush  3 mL Intravenous Q12H  . venlafaxine  112.5 mg Oral Q breakfast   And  . venlafaxine  37.5 mg Oral QAC supper    Admission status: Inpatient: Based on patients clinical presentation and evaluation of above clinical data, I have made determination that patient meets Inpatient criteria at this time.  Time spent: 30 min  [facility] Pager [phone]. If 7PM-7AM, please contact night-coverage at [email or web address], Office  [phone]  password [email or web address]  05/10/2018, 11:27 AM  LOS: 4 days

## 2018-05-10 NOTE — Progress Notes (Signed)
CCMD called to report HR 150's like afib/svt, pt in bed, only c/o feeling hot, bp 97/71, CCMD called again and hr 90's, paged Dr Darrick Meigs, pt hr back to 150-160, informed dr Darrick Meigs, to get ekg stat, hold lasix today and get mag

## 2018-05-10 NOTE — Progress Notes (Signed)
Pt called out feeling sob, 100% on 2l/[place], sr 80's on telemetry, lungs clear and sl decreased in bases, pt repositioned in bed to help open diaphram and verbalized feeling better "im not anxious, this is real" pt said she felt better when nurse left ro om

## 2018-05-10 NOTE — Consult Note (Signed)
Cardiology Consultation:   Patient ID: Shannon Carroll MRN: [id number]; DOB: Nov 07, 1950  Admit date: 05/06/2018 Date of Consult: 05/10/2018  Primary Care Provider: Harlan Stains, MD Primary Cardiologist: Minus Breeding, MD  Primary Electrophysiologist:  None    Patient Profile:   Shannon Carroll is a 68 y.o. female with a hx of morbid obesity, hypertension, hyperlipidemia, aortic stenosis, mitral stenosis who is being seen today for the evaluation of  Tachycardia  at the request of  Dr. Darrick Meigs. .  History of Present Illness:   Shannon Carroll is a 68 year old female who is followed by Dr. Percival Spanish.  Was admitted to the hospital with shortness of breath and bilateral lower extremity edema.  Distress and was found to have acute on chronic diastolic dysfunction.  She has been diuresing well and is overall feeling well.  As of 8.9 L.  She is developed hypokalemia and hypomagnesemia with the diuresis. Morning she developed a narrow complex tachycardia at 150 beats a minute.  The tachycardia would last for several minutes and was paroxysmal.  She was converted back to normal sinus rhythm in the 80s.  Slight dizziness with the episode.  She denied any chest pain.  She denied any acute distress.  She has been lying in bed all day. No angina like chest pain, no pleuritic chest pain.  She has had a cough for several days. Denies any fever, nausea vomiting or diarrhea.      Past Medical History:  Diagnosis Date  . Adenomatous polyps 12/14   f/u 5 yrs dr Penelope Coop  . Arthritis   . Chronic kidney disease    stage 2  . Depression   . Diabetes mellitus without complication ([place])   . Hirsutism   . Hyperlipidemia   . Hypertension   . Murmur   . Obesity   . Situational stress   . Stroke [facility])     Past Surgical History:  Procedure Laterality Date  . CESAREAN SECTION  12/23/1980  . COLONOSCOPY  06/2005-09/2007  . DILATION AND CURETTAGE OF UTERUS    . RIGHT/LEFT HEART CATH AND CORONARY ANGIOGRAPHY N/A  02/04/2018   Procedure: RIGHT/LEFT HEART CATH AND CORONARY ANGIOGRAPHY;  Surgeon: Nelva Bush, MD;  Location: [facility];  Service: Cardiovascular;  Laterality: N/A;  . TONSILLECTOMY AND ADENOIDECTOMY  1956     Home Medications:  Prior to Admission medications   Medication Sig Start Date End Date Taking? Authorizing Provider  amLODipine (NORVASC) 2.5 MG tablet Take 1 tablet (2.5 mg total) by mouth daily. 04/28/18 07/27/18 Yes Lendon Colonel, NP  atorvastatin (LIPITOR) 40 MG tablet Take 40 mg by mouth daily.   Yes [provider]  buPROPion (WELLBUTRIN XL) 150 MG 24 hr tablet Take 150 mg by mouth daily.   Yes [provider]  clonazePAM (KLONOPIN) 0.5 MG tablet Take 0.5 mg by mouth 2 (two) times daily as needed for anxiety.   Yes [provider]  clopidogrel (PLAVIX) 75 MG tablet Take 1 tablet (75 mg total) by mouth daily. 03/24/18  Yes Garvin Fila, MD  furosemide (LASIX) 20 MG tablet Take 20 mg by mouth daily as needed for fluid or edema.   Yes [provider]  furosemide (LASIX) 40 MG tablet Take 40 mg by mouth daily. X 3 days   Yes [provider]  iron polysaccharides (NIFEREX) 150 MG capsule Take 150 mg by mouth daily.   Yes [provider]  metFORMIN (GLUCOPHAGE) 1000 MG tablet Take 1,000 mg  by mouth 2 (two) times daily with a meal.   Yes [provider]  mirabegron ER (MYRBETRIQ) 25 MG TB24 tablet Take 25 mg by mouth daily.   Yes [provider]  Multiple Vitamins-Minerals (CENTRUM SILVER PO) Take by mouth.   Yes [provider]  nystatin (NYSTATIN) powder Apply 1 g topically 2 (two) times daily as needed (rash).   Yes [provider]  omeprazole (PRILOSEC) 40 MG capsule TAKE (1) CAPSULE BY MOUTH AT BEDTIME. Patient taking differently: Take 40 mg by mouth daily.  04/15/18  Yes Lendon Colonel, NP  venlafaxine (EFFEXOR) 37.5 MG tablet Take 37.5-75 mg by mouth See admin  instructions. Take 2 tablets (112.5 mg) by mouth in the morning & take 1 tablet (37.5 mg) by mouth at night.   Yes [provider]    Inpatient Medications: Scheduled Meds: . atorvastatin  40 mg Oral Daily  . buPROPion  150 mg Oral Daily  . clopidogrel  75 mg Oral Daily  . enoxaparin (LOVENOX) injection  40 mg Subcutaneous Q24H  . iron polysaccharides  150 mg Oral Daily  . mouth rinse  15 mL Mouth Rinse BID  . metoprolol tartrate  12.5 mg Oral BID  . mirabegron ER  25 mg Oral Daily  . multivitamin with minerals  1 tablet Oral Daily  . pantoprazole  40 mg Oral Daily  . potassium chloride  40 mEq Oral Once  . potassium chloride  20 mEq Oral BID  . sodium chloride flush  3 mL Intravenous Q12H  . [START ON 05/11/2018] torsemide  20 mg Oral Daily  . venlafaxine  112.5 mg Oral Q breakfast   And  . venlafaxine  37.5 mg Oral QAC supper   Continuous Infusions: . sodium chloride Stopped (05/10/18 1120)   PRN Meds: sodium chloride, acetaminophen, clonazePAM, dextromethorphan-guaiFENesin, hydrALAZINE, hydrOXYzine, levalbuterol, sodium chloride flush  Allergies:    Allergies  Allergen Reactions  . Lisinopril Cough    Social History:   Social History   Socioeconomic History  . Marital status: Married    Spouse name: Not on file  . Number of children: Not on file  . Years of education: Not on file  . Highest education level: Not on file  Occupational History  . Not on file  Social Needs  . Financial resource strain: Not on file  . Food insecurity:    Worry: Not on file    Inability: Not on file  . Transportation needs:    Medical: Not on file    Non-medical: Not on file  Tobacco Use  . Smoking status: Former Smoker    Last attempt to quit: 07/13/1993    Years since quitting: 24.8  . Smokeless tobacco: Never Used  Substance and Sexual Activity  . Alcohol use: Yes    Comment: 2 glasses per year  . Drug use: No  . Sexual activity: Not on file  Lifestyle  .  Physical activity:    Days per week: Not on file    Minutes per session: Not on file  . Stress: Not on file  Relationships  . Social connections:    Talks on phone: Not on file    Gets together: Not on file    Attends religious service: Not on file    Active member of club or organization: Not on file    Attends meetings of clubs or organizations: Not on file    Relationship status: Not on file  . Intimate partner violence:  Fear of current or ex partner: Not on file    Emotionally abused: Not on file    Physically abused: Not on file    Forced sexual activity: Not on file  Other Topics Concern  . Not on file  Social History Narrative   Pt moved 02/19/2018 to [place].    Family History:    Family History  Problem Relation Age of Onset  . Heart failure Mother   . Hypertension Mother   . Diabetes Mother   . Heart attack Father   . Skin cancer Father   . Hypertension Father   . CAD Father   . Hypertension Sister   . Diabetes Sister   . Hypertension Brother   . Diabetes Brother      ROS:  Please see the history of present illness.   All other ROS reviewed and negative.     Physical Exam/Data:   Vitals:   05/09/18 1921 05/10/18 0500 05/10/18 1142 05/10/18 1415  BP: (!) 123/92 110/74 118/82 97/71  Pulse: 99 86 95 94  Resp: 18 18 20    Temp: (!) 97 F (36.1 C) 98 F (36.7 C) 98.1 F (36.7 C)   TempSrc:  Oral Oral   SpO2: 98% 96% 92%   Weight:  133.6 kg    Height:        Intake/Output Summary (Last 24 hours) at 05/10/2018 1509 Last data filed at 05/10/2018 1120 Gross per 24 hour  Intake 1060.26 ml  Output 1800 ml  Net -739.74 ml   Last 3 Weights 05/10/2018 05/09/2018 05/08/2018  Weight (lbs) 294 lb 8.6 oz 298 lb 8.1 oz 304 lb 3.2 oz  Weight (kg) 133.6 kg 135.4 kg 137.984 kg     Body mass index is 44.78 kg/m.  General:   Morbidly obese female, no acute distress HEENT: normal Lymph: no adenopathy Neck: no JVD Endocrine:  No  thryomegaly Vascular: No carotid bruits; FA pulses 2+ bilaterally without bruits  Cardiac: Rate S1-S2.  Soft systolic murmur.  Heart sounds are distant. Lungs:  clear to auscultation bilaterally, no wheezing, rhonchi or rales  Abd: soft, nontender, no hepatomegaly  Ext: Her calves are wrapped with an Ace wrap.  She still has trace to 1+ edema above the Ace wraps. Musculoskeletal:  No deformities, BUE and BLE strength normal and equal Skin: warm and dry  Neuro:  CNs 2-12 intact, no focal abnormalities noted Psych:  Normal affect   EKG:  The EKG was personally reviewed and demonstrates: Sinus rhythm at 99 beats a minute.  She has occasional premature atrial contractions. Cute ST or T wave changes.  QTc is prolonged at 509 ms.  . Telemetry:  Telemetry was personally reviewed and demonstrates: Narrow complex tachycardia at 150 beats a minute.  She would then convert to sinus rhythm for several minutes.  Relevant CV Studies:   Laboratory Data:  Chemistry Recent Labs  Lab 05/08/18 0515 05/09/18 0306 05/10/18 0439  NA 141 138 137  K 2.8* 2.8* 3.2*  CL 98 92* 91*  CO2 32 34* 34*  GLUCOSE 116* 120* 105*  BUN 10 11 14   CREATININE 1.39* 1.22* 1.21*  CALCIUM 8.4* 8.4* 8.5*  GFRNONAA 39* 46* 46*  GFRAA 45* 53* 54*  ANIONGAP 11 12 12     Recent Labs  Lab 05/06/18 1752  PROT 6.5  ALBUMIN 3.0*  AST 29  ALT 18  ALKPHOS 78  BILITOT 1.0   Hematology Recent Labs  Lab 05/06/18 1752 05/07/18 0036  WBC 7.2  --   RBC 3.55* 3.37*  HGB 9.3*  --   HCT 32.4*  --   MCV 91.3  --   MCH 26.2  --   MCHC 28.7*  --   RDW 17.6*  --   PLT 285  --    Cardiac EnzymesNo results for input(s): TROPONINI in the last 168 hours.  Recent Labs  Lab 05/06/18 1755  TROPIPOC 0.05    BNP Recent Labs  Lab 05/04/18 0953 05/06/18 1752  BNP 1,137.1* 1,077.4*    DDimer No results for input(s): DDIMER in the last 168 hours.  Radiology/Studies:  Dg Chest Port 1 View  Result Date: 05/06/2018  CLINICAL DATA:  Dyspnea. Bilateral leg swelling. EXAM: PORTABLE CHEST 1 VIEW COMPARISON:  01/21/2018. FINDINGS: Poor inspiration. No gross change in borderline enlargement of the cardiac silhouette and mild prominence of the pulmonary vasculature and interstitial markings. No visible pleural fluid. Thoracic spine degenerative changes. IMPRESSION: Stable borderline cardiomegaly, mild pulmonary vascular congestion and mild chronic interstitial lung disease. Electronically Signed   By: Claudie Revering M.D.   On: 05/06/2018 18:07    Assessment and Plan:   1. 1.  Paroxysmal narrow complex tachycardia: We were not able to catch the tachycardia on a twelve-lead EKG.  It appears very irregular and appears to be narrow complex on telemetry.  The nurses tried several times to catch it on the EKG machine but has been unsuccessful.  Has a prolonged QT interval-presumably due to her her hypokalemia.   Has also been diuresed quite a bit.  We will stop the amlodipine and start diltiazem slow release 120 mg a day stating tomorrow .  Start her on metoprolol 12.5 mg twice a day today .  2.  Prolonged QT: I suspect this is due to her hypokalemia.  Her potassium is being replaced. She may benefit from the start of Aldactone  3.  Hyperlipidemia: Stable  4.  AS  - stable   5.   Mitral stenosis:   Stable       For questions or updates, please contact [place] Please consult [email or web address] for contact info under     Signed, Mertie Moores, MD  05/10/2018 3:09 PM

## 2018-05-11 LAB — URINALYSIS, ROUTINE W REFLEX MICROSCOPIC
Bilirubin Urine: NEGATIVE
Glucose, UA: NEGATIVE mg/dL
Hgb urine dipstick: NEGATIVE
Ketones, ur: NEGATIVE mg/dL
Leukocytes,Ua: NEGATIVE
Nitrite: POSITIVE — AB
Protein, ur: 100 mg/dL — AB
Specific Gravity, Urine: 1.021 (ref 1.005–1.030)
pH: 5 (ref 5.0–8.0)

## 2018-05-11 LAB — BASIC METABOLIC PANEL
Anion gap: 12 (ref 5–15)
BUN: 18 mg/dL (ref 8–23)
CHLORIDE: 90 mmol/L — AB (ref 98–111)
CO2: 32 mmol/L (ref 22–32)
Calcium: 8.6 mg/dL — ABNORMAL LOW (ref 8.9–10.3)
Creatinine, Ser: 1.25 mg/dL — ABNORMAL HIGH (ref 0.44–1.00)
GFR calc Af Amer: 52 mL/min — ABNORMAL LOW (ref >60)
GFR calc non Af Amer: 44 mL/min — ABNORMAL LOW (ref >60)
Glucose, Bld: 104 mg/dL — ABNORMAL HIGH (ref 70–99)
Potassium: 3.8 mmol/L (ref 3.5–5.1)
Sodium: 134 mmol/L — ABNORMAL LOW (ref 135–145)

## 2018-05-11 MED ORDER — METOPROLOL TARTRATE 25 MG PO TABS
25.0000 mg | ORAL_TABLET | Freq: Two times a day (BID) | ORAL | Status: DC
  Administered 2018-05-11 – 2018-05-13 (×5): 25 mg via ORAL
  Filled 2018-05-11 (×5): qty 1

## 2018-05-11 MED ORDER — FUROSEMIDE 10 MG/ML IJ SOLN
80.0000 mg | Freq: Two times a day (BID) | INTRAMUSCULAR | Status: DC
  Administered 2018-05-11 – 2018-05-13 (×5): 80 mg via INTRAVENOUS
  Filled 2018-05-11 (×5): qty 8

## 2018-05-11 NOTE — Progress Notes (Signed)
SATURATION QUALIFICATIONS: (This note is used to comply with regulatory documentation for home oxygen)  Patient Saturations on Room Air at Rest =81%  Patient Saturations on Room Air while Ambulating = na, did not attempt    Please briefly explain why patient needs home oxygen:pt gets sob, ra sat 81%m 2l sat 97%

## 2018-05-11 NOTE — Progress Notes (Signed)
Pt room smelled of strong urine, bed sheet and pad soaked, pt had pericare  And and bed and gown changed, excoriated bil panus folds, cleansed well  Dried and powder applied, Dr Darrick Meigs making rounds and informed of foul urine smell. Pt is asx but will treat with keflex po several days as she did have nitrite + ua on admit

## 2018-05-11 NOTE — Progress Notes (Signed)
Physical Therapy Treatment Patient Details Name: Shannon Carroll MRN: [id number] DOB: 05-11-1950 Today's Date: 05/11/2018    History of Present Illness 68 year old female with a history of hypertension, hyperlipidemia, diet-controlled diabetes mellitus, stroke, depression, CKD stage III, diastolic CHF, aortic valve stenosis came with shortness of breath and bilateral lower extremity edema.  Patient admitted with acute on chronic diastolic CHF.    PT Comments    Patient seen for mobility progression. Patient requiring Mod/Max A +2 for all functional mobility. Difficulty sequencing and following commands this visit with patient requiring consistent verbal and tactile cueing. Sit to stand in STEDY today with patient limited due to fatigue. Making slow progress towards goals with PT to continue to recommend SNF at discharge.     Follow Up Recommendations  SNF;Supervision for mobility/OOB     Equipment Recommendations  None recommended by PT    Recommendations for Other Services       Precautions / Restrictions Precautions Precautions: Fall Restrictions Weight Bearing Restrictions: No    Mobility  Bed Mobility Overal bed mobility: Needs Assistance Bed Mobility: Supine to Sit     Supine to sit: Mod assist;+2 for safety/equipment;HOB elevated     General bed mobility comments: Mod A +2 to come to EOB - physical assist for LE and trunk management - heavy posterior lean in sitting; patient reporting anxiety with fear of falling  Transfers Overall transfer level: Needs assistance   Transfers: Sit to/from Stand Sit to Stand: Mod assist;Max assist;+2 physical assistance;From elevated surface         General transfer comment: Mod/Max A +2 to stand at [facility]; requires consistent verbal cueing for hand placement and sequencing. Requires physical assist for LE placement in STEDY; sit to stand in STEDY x 3  Ambulation/Gait             General Gait Details: unable   Stairs              Wheelchair Mobility    Modified Rankin (Stroke Patients Only)       Balance Overall balance assessment: Needs assistance Sitting-balance support: Bilateral upper extremity supported;Feet supported Sitting balance-Leahy Scale: Poor Sitting balance - Comments: significant posterior lean, unable to maintain sitting balance without UE support or external assist. Postural control: Posterior lean Standing balance support: Bilateral upper extremity supported Standing balance-Leahy Scale: Poor Standing balance comment: requires physical assist and use of STEDY                            Cognition Arousal/Alertness: Awake/alert Behavior During Therapy: WFL for tasks assessed/performed Overall Cognitive Status: No family/caregiver present to determine baseline cognitive functioning                                 General Comments: patient demonstrating difficulty following commands and sequencing of tasks - requires consistent verbal and tactile cues throughout      Exercises      General Comments        Pertinent Vitals/Pain Pain Assessment: No/denies pain    Home Living                      Prior Function            PT Goals (current goals can now be found in the care plan section) Acute Rehab PT Goals Patient Stated Goal: to get stronger PT  Goal Formulation: With patient Time For Goal Achievement: 05/23/18 Potential to Achieve Goals: Fair Progress towards PT goals: Progressing toward goals    Frequency    Min 2X/week      PT Plan Current plan remains appropriate    Co-evaluation PT/OT/SLP Co-Evaluation/Treatment: Yes Reason for Co-Treatment: Complexity of the patient's impairments (multi-system involvement);For patient/therapist safety;To address functional/ADL transfers PT goals addressed during session: Mobility/safety with mobility;Balance;Strengthening/ROM        AM-PAC PT "6 Clicks" Mobility    Outcome Measure  Help needed turning from your back to your side while in a flat bed without using bedrails?: A Lot Help needed moving from lying on your back to sitting on the side of a flat bed without using bedrails?: A Lot Help needed moving to and from a bed to a chair (including a wheelchair)?: A Lot Help needed standing up from a chair using your arms (e.g., wheelchair or bedside chair)?: Total Help needed to walk in hospital room?: Total Help needed climbing 3-5 steps with a railing? : Total 6 Click Score: 9    End of Session Equipment Utilized During Treatment: Gait belt;Oxygen Activity Tolerance: Patient limited by fatigue Patient left: in chair;with call bell/phone within reach Nurse Communication: Mobility status PT Visit Diagnosis: Muscle weakness (generalized) (M62.81);Difficulty in walking, not elsewhere classified (R26.2)     Time: 0973-5329 PT Time Calculation (min) (ACUTE ONLY): 27 min  Charges:  $Therapeutic Activity: 8-22 mins                      Lanney Gins, PT, DPT Supplemental Physical Therapist 05/11/18 12:51 PM Pager: [phone] Office: [phone]

## 2018-05-11 NOTE — TOC Progression Note (Signed)
Transition of Care [facility]) - Progression Note    Patient Details  Name: Shannon Carroll MRN: [id number] Date of Birth: 23-Dec-1950  Transition of Care [facility]) CM/SW [place], LCSW Phone Number: 05/11/2018, 3:03 PM  Clinical Narrative: Patient's private pay social worker asked about [facility] and [facility]. [facility] is full but [facility] offered a bed. Called patient's son Shannon Carroll to update him. Encouraged him to visit the Medicare.gov website to review SNF star ratings.  Expected Discharge Plan: Assisted Living Barriers to Discharge: Continued Medical Work up  Expected Discharge Plan and Services Expected Discharge Plan: Assisted Living       Living arrangements for the past 2 months: Assisted Living Facility                           Social Determinants of Health (SDOH) Interventions    Readmission Risk Interventions No flowsheet data found.

## 2018-05-11 NOTE — Progress Notes (Signed)
[facility]  PROGRESS NOTE  Shannon Carroll PQZ:[id number] DOB: Mar 04, 1950 DOA: 05/06/2018 PCP: Harlan Stains, MD   Brief HPI:   68 year old female with a history of hypertension, hyperlipidemia, diet-controlled diabetes mellitus, stroke, depression, CKD stage III, diastolic CHF, aortic valve stenosis came with shortness of breath and bilateral lower extremity edema.  Patient admitted with acute on chronic diastolic CHF.   Subjective   Patient seen and examined, yesterday she went into tachycardia with heart rate 150s.  Unknown rhythm as it converted back to normal sinus rhythm when EKG was going to be performed.  Patient is breathing much better.  Cardiology is following.  At the time of admission patient had positive nitrite.  But no antibiotics were started as it was thought to be asymptomatic bacteriuria.  Yesterday patient did feel warm while she urinated.   Assessment/Plan:     1. Acute on chronic diastolic CHF-improving, patient presented with dyspnea, leg edema, BNP 1137, vascular congestion on chest x-ray.  2D echo in December 2019 showed grade 2 diastolic dysfunction. -Lasix was held yesterday, diuresing well, net - 8.9 L.  Daily BMP.  Daily weights.  Strict intake and output.  Cardiology has restarted Lasix 80 mg every 12 hours along with Demadex.  2. Hyperlipidemia-continue Lipitor  3. Hypertension-blood pressure is stable.  Continue amlodipine  4. Depression/anxiety-continue Wellbutrin, Klonopin  5. History of stroke-at baseline, continue Lipitor, Plavix  6. CKD stage III-creatinine at baseline 1.0-1.3  7. Hypokalemia-potassium is replete  8. Normocytic anemia-B12 686, folate 21.7, iron 21, TIBC 221, iron saturation 9%, ferritin 77.  Continue iron supplementation Niferex  9. ?  UTI-complains of mild dysuria, we will obtain UA and culture.     CBG: Recent Labs  Lab 05/07/18 0601 05/10/18 0621  GLUCAP 84 101*    CBC: Recent Labs  Lab 05/06/18 1752   WBC 7.2  NEUTROABS 5.7  HGB 9.3*  HCT 32.4*  MCV 91.3  PLT 335    Basic Metabolic Panel: Recent Labs  Lab 05/06/18 1920 05/07/18 0036 05/08/18 0515 05/09/18 0306 05/10/18 0439 05/10/18 1451 05/11/18 0433  NA  --  141 141 138 137  --  134*  K  --  3.3* 2.8* 2.8* 3.2* 3.7 3.8  CL  --  105 98 92* 91*  --  90*  CO2  --  27 32 34* 34*  --  32  GLUCOSE  --  98 116* 120* 105*  --  104*  BUN  --  12 10 11 14   --  18  CREATININE  --  1.25* 1.39* 1.22* 1.21*  --  1.25*  CALCIUM  --  8.3* 8.4* 8.4* 8.5*  --  8.6*  MG 1.8  --   --   --   --  2.0  --      DVT prophylaxis: Lovenox  Code Status: Full code  Family Communication: Discussed with patient's caregiver at bedside  Disposition Plan: likely skilled nursing facility in am     Consultants:    Procedures:     Antibiotics:   Anti-infectives (From admission, onward)   None       Objective   Vitals:   05/10/18 1944 05/11/18 0224 05/11/18 0700 05/11/18 0948  BP: 120/74 106/65 104/76 (!) 108/58  Pulse: 81 75 79 83  Resp: 18 18 20    Temp: 98 F (36.7 C) 98.2 F (36.8 C)    TempSrc:  Oral    SpO2: 97% 97% 100% 97%  Weight:  134.2  kg    Height:        Intake/Output Summary (Last 24 hours) at 05/11/2018 0959 Last data filed at 05/11/2018 5366 Gross per 24 hour  Intake 1011.26 ml  Output 800 ml  Net 211.26 ml   Filed Weights   05/09/18 0516 05/10/18 0500 05/11/18 0224  Weight: 135.4 kg 133.6 kg 134.2 kg     Physical Examination:   General: Appears in no acute distress  Cardiovascular: S1-S2, regular  Respiratory: Decreased breath sounds at lung bases.  Abdomen: Abdomen is soft, nontender, no organomegaly.  Extremities: Both lower extremities in dressing.  Neurologic: Alert, oriented x3, no focal deficit noted.      Data Reviewed: I have personally reviewed following labs and imaging studies   Recent Results (from the past 240 hour(s))  MRSA PCR Screening     Status: None    Collection Time: 05/06/18 10:29 PM  Result Value Ref Range Status   MRSA by PCR NEGATIVE NEGATIVE Final    Comment:        The GeneXpert MRSA Assay (FDA approved for NASAL specimens only), is one component of a comprehensive MRSA colonization surveillance program. It is not intended to diagnose MRSA infection nor to guide or monitor treatment for MRSA infections. Performed at [facility], [place] [address] [place], [place] [address]      Liver Function Tests: Recent Labs  Lab 05/06/18 1752  AST 29  ALT 18  ALKPHOS 78  BILITOT 1.0  PROT 6.5  ALBUMIN 3.0*   No results for input(s): LIPASE, AMYLASE in the last 168 hours. No results for input(s): AMMONIA in the last 168 hours.  Cardiac Enzymes: No results for input(s): CKTOTAL, CKMB, CKMBINDEX, TROPONINI in the last 168 hours. BNP (last 3 results) Recent Labs    05/04/18 0953 05/06/18 1752  BNP 1,137.1* 1,077.4*    ProBNP (last 3 results) No results for input(s): PROBNP in the last 8760 hours.    Studies: No results found.  Scheduled Meds: . atorvastatin  40 mg Oral Daily  . buPROPion  150 mg Oral Daily  . clopidogrel  75 mg Oral Daily  . enoxaparin (LOVENOX) injection  40 mg Subcutaneous Q24H  . furosemide  80 mg Intravenous BID  . iron polysaccharides  150 mg Oral Daily  . mouth rinse  15 mL Mouth Rinse BID  . metoprolol tartrate  25 mg Oral BID  . mirabegron ER  25 mg Oral Daily  . multivitamin with minerals  1 tablet Oral Daily  . pantoprazole  40 mg Oral Daily  . potassium chloride  40 mEq Oral Once  . potassium chloride  20 mEq Oral BID  . sodium chloride flush  3 mL Intravenous Q12H  . venlafaxine  112.5 mg Oral Q breakfast   And  . venlafaxine  37.5 mg Oral QAC supper    Admission status: Inpatient: Based on patients clinical presentation and evaluation of above clinical data, I have made determination that patient meets Inpatient criteria at this time.  Time spent: 30 min  [facility] Pager [phone]. If 7PM-7AM, please contact night-coverage at [email or web address], Office  [phone]  password [email or web address]  05/11/2018, 9:59 AM  LOS: 5 days

## 2018-05-11 NOTE — Progress Notes (Signed)
Progress Note  Patient Name: Shannon Carroll Date of Encounter: 05/11/2018  Primary Cardiologist: Minus Breeding, MD   Subjective   Still dyspneic; no CP  Inpatient Medications    Scheduled Meds: . atorvastatin  40 mg Oral Daily  . buPROPion  150 mg Oral Daily  . clopidogrel  75 mg Oral Daily  . enoxaparin (LOVENOX) injection  40 mg Subcutaneous Q24H  . iron polysaccharides  150 mg Oral Daily  . mouth rinse  15 mL Mouth Rinse BID  . metoprolol tartrate  12.5 mg Oral BID  . mirabegron ER  25 mg Oral Daily  . multivitamin with minerals  1 tablet Oral Daily  . pantoprazole  40 mg Oral Daily  . potassium chloride  40 mEq Oral Once  . potassium chloride  20 mEq Oral BID  . sodium chloride flush  3 mL Intravenous Q12H  . torsemide  20 mg Oral Daily  . venlafaxine  112.5 mg Oral Q breakfast   And  . venlafaxine  37.5 mg Oral QAC supper   Continuous Infusions: . sodium chloride Stopped (05/10/18 1120)   PRN Meds: sodium chloride, acetaminophen, clonazePAM, dextromethorphan-guaiFENesin, hydrALAZINE, hydrOXYzine, levalbuterol, sodium chloride flush   Vital Signs    Vitals:   05/10/18 1732 05/10/18 1944 05/11/18 0224 05/11/18 0700  BP:  120/74 106/65 104/76  Pulse: 80 81 75 79  Resp:  18 18 20   Temp:  98 F (36.7 C) 98.2 F (36.8 C)   TempSrc:   Oral   SpO2: 100% 97% 97% 100%  Weight:   134.2 kg   Height:        Intake/Output Summary (Last 24 hours) at 05/11/2018 0835 Last data filed at 05/11/2018 0800 Gross per 24 hour  Intake 1134.26 ml  Output 1200 ml  Net -65.74 ml   Last 3 Weights 05/11/2018 05/10/2018 05/09/2018  Weight (lbs) 295 lb 13.7 oz 294 lb 8.6 oz 298 lb 8.1 oz  Weight (kg) 134.2 kg 133.6 kg 135.4 kg      Telemetry    Sinus with short run of SVT - Personally Reviewed  Physical Exam   GEN: No acute distress.  Obese Neck: JVD difficult to assess Cardiac: RRR, 2/6 systolic murmur Respiratory: Diffuse exp wheeze GI: Soft, nontender, obese; edema  of pannus MS: 2+ thigh edema Neuro:  Nonfocal  Psych: Normal affect   Labs    Chemistry Recent Labs  Lab 05/06/18 1752  05/09/18 0306 05/10/18 0439 05/10/18 1451 05/11/18 0433  NA 141   < > 138 137  --  134*  K 3.2*   < > 2.8* 3.2* 3.7 3.8  CL 104   < > 92* 91*  --  90*  CO2 25   < > 34* 34*  --  32  GLUCOSE 115*   < > 120* 105*  --  104*  BUN 10   < > 11 14  --  18  CREATININE 1.32*   < > 1.22* 1.21*  --  1.25*  CALCIUM 8.5*   < > 8.4* 8.5*  --  8.6*  PROT 6.5  --   --   --   --   --   ALBUMIN 3.0*  --   --   --   --   --   AST 29  --   --   --   --   --   ALT 18  --   --   --   --   --  ALKPHOS 78  --   --   --   --   --   BILITOT 1.0  --   --   --   --   --   GFRNONAA 42*   < > 46* 46*  --  44*  GFRAA 48*   < > 53* 54*  --  52*  ANIONGAP 12   < > 12 12  --  12   < > = values in this interval not displayed.     Hematology Recent Labs  Lab 05/06/18 1752 05/07/18 0036  WBC 7.2  --   RBC 3.55* 3.37*  HGB 9.3*  --   HCT 32.4*  --   MCV 91.3  --   MCH 26.2  --   MCHC 28.7*  --   RDW 17.6*  --   PLT 285  --      Recent Labs  Lab 05/06/18 1755  TROPIPOC 0.05     BNP Recent Labs  Lab 05/04/18 0953 05/06/18 1752  BNP 1,137.1* 1,077.4*     Patient Profile     68 y.o. female with past medical history of mild aortic stenosis/moderate mitral stenosis, pulmonary hypertension, hypertension, diabetes mellitus, hyperlipidemia, prior stroke, chronic stage III kidney disease, obstructive sleep apnea admitted with acute on chronic diastolic congestive heart failure.  Also noted to have SVT on telemetry.  Assessment & Plan    1 acute on chronic diastolic congestive heart failure-I/O+84; wt 134.2 kg.  Patient remains markedly volume overloaded on examination.  There is probable component of right heart failure given history of pulmonary hypertension, obesity hypoventilation syndrome and obstructive sleep apnea.  Discontinue Demadex.  Treat with Lasix 80 mg IV  twice daily.  Follow potassium and renal function closely.  2 pulmonary hypertension-likely combination of pulmonary venous hypertension related to valvular heart disease as well as obstructive sleep apnea and obesity hypoventilation syndrome.  3 supraventricular tachycardia-short runs of SVT noted on telemetry.  Increase metoprolol to 25 mg twice daily.  4 history of moderate mitral stenosis and mild aortic stenosis-we will need follow-up echoes in the future.  5 chronic stage III kidney disease-follow renal function closely with diuresis.  6 obstructive sleep apnea-we will need follow-up as an outpatient.  7 morbid obesity-needs significant weight loss.  For questions or updates, please contact [place] Please consult [email or web address] for contact info under        Signed, Kirk Ruths, MD  05/11/2018, 8:35 AM

## 2018-05-11 NOTE — Progress Notes (Signed)
Occupational Therapy Treatment Patient Details Name: Shannon Carroll MRN: [id number] DOB: [birth year] Today's Date: 05/11/2018    History of present illness 68 year old female with a history of hypertension, hyperlipidemia, diet-controlled diabetes mellitus, stroke, depression, CKD stage III, diastolic CHF, aortic valve stenosis came with shortness of breath and bilateral lower extremity edema.  Patient admitted with acute on chronic diastolic CHF.   OT comments  Pt anxious about falling even with use of stedy for sit<>stand and transfer to chair. Pt with slower processing speed and requiring repetition of commands and multimodal cues. Continues to require +2 assist for standing. Performed seated grooming once in the chair. Pt continues to be appropriate for SNF level rehab.   Follow Up Recommendations  SNF;Supervision/Assistance - 24 hour    Equipment Recommendations  None recommended by OT    Recommendations for Other Services      Precautions / Restrictions Precautions Precautions: Fall Restrictions Weight Bearing Restrictions: No       Mobility Bed Mobility Overal bed mobility: Needs Assistance Bed Mobility: Supine to Sit     Supine to sit: Mod assist;+2 for safety/equipment;HOB elevated     General bed mobility comments: Mod A +2 to come to EOB - physical assist for LE and trunk management - heavy posterior lean in sitting; patient reporting anxiety with fear of falling  Transfers Overall transfer level: Needs assistance   Transfers: Sit to/from Stand Sit to Stand: Mod assist;Max assist;+2 physical assistance;From elevated surface         General transfer comment: Mod/Max A +2 to stand at [facility]; requires consistent verbal cueing for hand placement and sequencing. Requires physical assist for LE placement in STEDY; sit to stand in STEDY x 3    Balance Overall balance assessment: Needs assistance Sitting-balance support: Bilateral upper extremity supported;Feet  supported Sitting balance-Leahy Scale: Poor Sitting balance - Comments: significant posterior lean, unable to maintain sitting balance without UE support or external assist. Postural control: Posterior lean Standing balance support: Bilateral upper extremity supported Standing balance-Leahy Scale: Poor Standing balance comment: requires physical assist and use of STEDY                           ADL either performed or assessed with clinical judgement   ADL Overall ADL's : Needs assistance/impaired     Grooming: Brushing hair;Sitting(set up after shampoo cap)               Lower Body Dressing: Total assistance;Bed level Lower Body Dressing Details (indicate cue type and reason): socks                     Vision       Perception     Praxis      Cognition Arousal/Alertness: Awake/alert Behavior During Therapy: WFL for tasks assessed/performed Overall Cognitive Status: No family/caregiver present to determine baseline cognitive functioning                                 General Comments: patient demonstrating difficulty following commands and sequencing of tasks - requires consistent verbal and tactile cues throughout        Exercises     Shoulder Instructions       General Comments      Pertinent Vitals/ Pain       Pain Assessment: No/denies pain  Home Living  Prior Functioning/Environment              Frequency  Min 2X/week        Progress Toward Goals  OT Goals(current goals can now be found in the care plan section)  Progress towards OT goals: Progressing toward goals  Acute Rehab OT Goals Patient Stated Goal: to get stronger OT Goal Formulation: With patient Time For Goal Achievement: 05/23/18 Potential to Achieve Goals: Good  Plan Discharge plan remains appropriate    Co-evaluation    PT/OT/SLP Co-Evaluation/Treatment: Yes Reason for  Co-Treatment: Complexity of the patient's impairments (multi-system involvement);For patient/therapist safety PT goals addressed during session: Mobility/safety with mobility;Balance;Strengthening/ROM OT goals addressed during session: Strengthening/ROM;ADL's and self-care      AM-PAC OT "6 Clicks" Daily Activity     Outcome Measure   Help from another person eating meals?: None Help from another person taking care of personal grooming?: A Little Help from another person toileting, which includes using toliet, bedpan, or urinal?: Total Help from another person bathing (including washing, rinsing, drying)?: A Lot Help from another person to put on and taking off regular upper body clothing?: A Little Help from another person to put on and taking off regular lower body clothing?: Total 6 Click Score: 14    End of Session Equipment Utilized During Treatment: Gait belt  OT Visit Diagnosis: Unsteadiness on feet (R26.81);Other abnormalities of gait and mobility (R26.89);Muscle weakness (generalized) (M62.81);Other symptoms and signs involving cognitive function   Activity Tolerance Patient limited by fatigue(anxiety)   Patient Left in chair;with call bell/phone within reach;with chair alarm set   Nurse Communication          Time: 0762-2633 OT Time Calculation (min): 16 min  Charges: OT General Charges $OT Visit: 1 Visit OT Treatments $Therapeutic Activity: 8-22 mins  Nestor Lewandowsky, OTR/L Acute Rehabilitation Services Pager: [phone] Office: [phone]   Malka So 05/11/2018, 1:03 PM

## 2018-05-12 LAB — BASIC METABOLIC PANEL
Anion gap: 16 — ABNORMAL HIGH (ref 5–15)
BUN: 26 mg/dL — AB (ref 8–23)
CO2: 29 mmol/L (ref 22–32)
Calcium: 8.6 mg/dL — ABNORMAL LOW (ref 8.9–10.3)
Chloride: 92 mmol/L — ABNORMAL LOW (ref 98–111)
Creatinine, Ser: 1.54 mg/dL — ABNORMAL HIGH (ref 0.44–1.00)
GFR calc Af Amer: 40 mL/min — ABNORMAL LOW (ref >60)
GFR calc non Af Amer: 35 mL/min — ABNORMAL LOW (ref >60)
Glucose, Bld: 86 mg/dL (ref 70–99)
Potassium: 3.9 mmol/L (ref 3.5–5.1)
Sodium: 137 mmol/L (ref 135–145)

## 2018-05-12 LAB — CBC
HCT: 30.8 % — ABNORMAL LOW (ref 36.0–46.0)
Hemoglobin: 8.9 g/dL — ABNORMAL LOW (ref 12.0–15.0)
MCH: 25.1 pg — ABNORMAL LOW (ref 26.0–34.0)
MCHC: 28.9 g/dL — ABNORMAL LOW (ref 30.0–36.0)
MCV: 87 fL (ref 80.0–100.0)
Platelets: 245 10*3/uL (ref 150–400)
RBC: 3.54 MIL/uL — AB (ref 3.87–5.11)
RDW: 17.5 % — ABNORMAL HIGH (ref 11.5–15.5)
WBC: 7.4 10*3/uL (ref 4.0–10.5)
nRBC: 0 % (ref 0.0–0.2)

## 2018-05-12 MED ORDER — SODIUM CHLORIDE 0.9 % IV SOLN
1.0000 g | INTRAVENOUS | Status: DC
  Administered 2018-05-12 – 2018-05-16 (×5): 1 g via INTRAVENOUS
  Filled 2018-05-12 (×5): qty 10

## 2018-05-12 NOTE — Progress Notes (Signed)
Progress Note  Patient Name: Shannon Carroll Date of Encounter: 05/12/2018  Primary Cardiologist: Minus Breeding, MD   Subjective   No dyspnea or CP  Inpatient Medications    Scheduled Meds: . atorvastatin  40 mg Oral Daily  . buPROPion  150 mg Oral Daily  . clopidogrel  75 mg Oral Daily  . enoxaparin (LOVENOX) injection  40 mg Subcutaneous Q24H  . furosemide  80 mg Intravenous BID  . iron polysaccharides  150 mg Oral Daily  . mouth rinse  15 mL Mouth Rinse BID  . metoprolol tartrate  25 mg Oral BID  . mirabegron ER  25 mg Oral Daily  . multivitamin with minerals  1 tablet Oral Daily  . pantoprazole  40 mg Oral Daily  . potassium chloride  40 mEq Oral Once  . potassium chloride  20 mEq Oral BID  . sodium chloride flush  3 mL Intravenous Q12H  . venlafaxine  112.5 mg Oral Q breakfast   And  . venlafaxine  37.5 mg Oral QAC supper   Continuous Infusions: . sodium chloride Stopped (05/10/18 1120)   PRN Meds: sodium chloride, acetaminophen, clonazePAM, dextromethorphan-guaiFENesin, hydrALAZINE, hydrOXYzine, levalbuterol, sodium chloride flush   Vital Signs    Vitals:   05/11/18 2254 05/12/18 0139 05/12/18 0535 05/12/18 0844  BP: (!) 118/59  96/69 111/74  Pulse: 90  65 68  Resp:   18   Temp:   97.7 F (36.5 C)   TempSrc:   Oral   SpO2:   100%   Weight:  133.3 kg    Height:        Intake/Output Summary (Last 24 hours) at 05/12/2018 0935 Last data filed at 05/12/2018 0855 Gross per 24 hour  Intake 1003 ml  Output 850 ml  Net 153 ml   Last 3 Weights 05/12/2018 05/11/2018 05/10/2018  Weight (lbs) 293 lb 14 oz 295 lb 13.7 oz 294 lb 8.6 oz  Weight (kg) 133.3 kg 134.2 kg 133.6 kg      Telemetry    Sinus with 6 beats NSVT - Personally Reviewed  Physical Exam   GEN: WD Obese Neck: supple Cardiac: RRR, 2/6 systolic murmur, no gallop Respiratory: Mild wheeze GI: Soft, nontender, obese; edema of pannus; no masses MS: 2+ thigh edema; lower ext wrapped Neuro:   grossly intact  Labs    Chemistry Recent Labs  Lab 05/06/18 1752  05/10/18 0439 05/10/18 1451 05/11/18 0433 05/12/18 0421  NA 141   < > 137  --  134* 137  K 3.2*   < > 3.2* 3.7 3.8 3.9  CL 104   < > 91*  --  90* 92*  CO2 25   < > 34*  --  32 29  GLUCOSE 115*   < > 105*  --  104* 86  BUN 10   < > 14  --  18 26*  CREATININE 1.32*   < > 1.21*  --  1.25* 1.54*  CALCIUM 8.5*   < > 8.5*  --  8.6* 8.6*  PROT 6.5  --   --   --   --   --   ALBUMIN 3.0*  --   --   --   --   --   AST 29  --   --   --   --   --   ALT 18  --   --   --   --   --   ALKPHOS 78  --   --   --   --   --  BILITOT 1.0  --   --   --   --   --   GFRNONAA 42*   < > 46*  --  44* 35*  GFRAA 48*   < > 54*  --  52* 40*  ANIONGAP 12   < > 12  --  12 16*   < > = values in this interval not displayed.     Hematology Recent Labs  Lab 05/06/18 1752 05/07/18 0036 05/12/18 0421  WBC 7.2  --  7.4  RBC 3.55* 3.37* 3.54*  HGB 9.3*  --  8.9*  HCT 32.4*  --  30.8*  MCV 91.3  --  87.0  MCH 26.2  --  25.1*  MCHC 28.7*  --  28.9*  RDW 17.6*  --  17.5*  PLT 285  --  245     Recent Labs  Lab 05/06/18 1755  TROPIPOC 0.05     BNP Recent Labs  Lab 05/06/18 1752  BNP 1,077.4*     Patient Profile     68 y.o. female with past medical history of mild aortic stenosis/moderate mitral stenosis, pulmonary hypertension, hypertension, diabetes mellitus, hyperlipidemia, prior stroke, chronic stage III kidney disease, obstructive sleep apnea admitted with acute on chronic diastolic congestive heart failure.  Also noted to have SVT on telemetry.  Assessment & Plan    1 acute on chronic diastolic congestive heart failure-I/O -257; wt 133.3 kg.  Patient remains volume overloaded today.  As outlined previously there is likely a component of right heart failure related to pulmonary hypertension/obesity hypoventilation syndrome and obstructive sleep apnea.  Continue IV Lasix at present dose.  Follow renal function.    2  pulmonary hypertension-likely combination of pulmonary venous hypertension related to valvular heart disease as well as obstructive sleep apnea and obesity hypoventilation syndrome.  3 supraventricular tachycardia-no further runs of SVT over the past 24 hours.  Continue present dose of metoprolol.    4 history of moderate mitral stenosis and mild aortic stenosis-we will need follow-up echoes in the future.  5 chronic stage III kidney disease-creatinine slightly increased today.  Will follow with diuresis.  6 obstructive sleep apnea-we will need follow-up as an outpatient.  7 morbid obesity-needs significant weight loss.  For questions or updates, please contact [place] Please consult [email or web address] for contact info under        Signed, Kirk Ruths, MD  05/12/2018, 9:35 AM

## 2018-05-12 NOTE — Care Management Important Message (Signed)
Important Message  Patient Details  Name: Shannon Carroll MRN: [id number] Date of Birth: Feb 11, 1951   Medicare Important Message Given:  Yes    Brittane Grudzinski 05/12/2018, 2:57 PM

## 2018-05-12 NOTE — Progress Notes (Signed)
[facility]  PROGRESS NOTE  Shannon Carroll TFT:[id number] DOB: 1950/12/21 DOA: 05/06/2018 PCP: Harlan Stains, MD   Brief HPI:   68 year old female with a history of hypertension, hyperlipidemia, diet-controlled diabetes mellitus, stroke, depression, CKD stage III, diastolic CHF, aortic valve stenosis came with shortness of breath and bilateral lower extremity edema.  Patient admitted with acute on chronic diastolic CHF.   Subjective   Patient seen and examined, denies shortness of breath.  UA obtained yesterday is positive for nitrite with many bacteria.   Assessment/Plan:     1. Acute on chronic diastolic CHF-improving, patient presented with dyspnea, leg edema, BNP 1137, vascular congestion on chest x-ray.  2D echo in December 2019 showed grade 2 diastolic dysfunction. -Lasix was held yesterday, diuresing well, net - 8.8 L.  Daily BMP.  Daily weights.  Strict intake and output.  Cardiology feels that patient has right heart failure and has restarted Lasix 80 mg every 12 hours along with Demadex.  2. Hyperlipidemia-continue Lipitor  3. Hypertension-blood pressure is stable.  Continue amlodipine  4. Depression/anxiety-continue Wellbutrin, Klonopin  5. History of stroke-at baseline, continue Lipitor, Plavix  6. CKD stage III-creatinine at baseline 1.0-1.3.  Today creatinine is up to 1.54.  Secondary to diuresis.  Check renal function a.m.  7. Hypokalemia-potassium is replete  8. Normocytic anemia-B12 686, folate 21.7, iron 21, TIBC 221, iron saturation 9%, ferritin 77.  Continue iron supplementation Niferex  9. UTI-patient has abnormal UA, with dysuria urine culture has been ordered.  Will start IV Rocephin 1 g IV daily.  10. SVT-patient had episode of SVT 2 days ago, currently she is in normal sinus rhythm.  No more episodes of SVT.  Continue metoprolol.     CBG: Recent Labs  Lab 05/07/18 0601 05/10/18 0621  GLUCAP 84 101*    CBC: Recent Labs  Lab  05/06/18 1752 05/12/18 0421  WBC 7.2 7.4  NEUTROABS 5.7  --   HGB 9.3* 8.9*  HCT 32.4* 30.8*  MCV 91.3 87.0  PLT 285 706    Basic Metabolic Panel: Recent Labs  Lab 05/06/18 1920  05/08/18 0515 05/09/18 0306 05/10/18 0439 05/10/18 1451 05/11/18 0433 05/12/18 0421  NA  --    < > 141 138 137  --  134* 137  K  --    < > 2.8* 2.8* 3.2* 3.7 3.8 3.9  CL  --    < > 98 92* 91*  --  90* 92*  CO2  --    < > 32 34* 34*  --  32 29  GLUCOSE  --    < > 116* 120* 105*  --  104* 86  BUN  --    < > 10 11 14   --  18 26*  CREATININE  --    < > 1.39* 1.22* 1.21*  --  1.25* 1.54*  CALCIUM  --    < > 8.4* 8.4* 8.5*  --  8.6* 8.6*  MG 1.8  --   --   --   --  2.0  --   --    < > = values in this interval not displayed.     DVT prophylaxis: Lovenox  Code Status: Full code  Family Communication: Discussed with patient's caregiver at bedside  Disposition Plan: likely skilled nursing facility in am     Consultants:    Procedures:     Antibiotics:   Anti-infectives (From admission, onward)   None       Objective  Vitals:   05/11/18 2254 05/12/18 0139 05/12/18 0535 05/12/18 0844  BP: (!) 118/59  96/69 111/74  Pulse: 90  65 68  Resp:   18   Temp:   97.7 F (36.5 C)   TempSrc:   Oral   SpO2:   100%   Weight:  133.3 kg    Height:        Intake/Output Summary (Last 24 hours) at 05/12/2018 1009 Last data filed at 05/12/2018 1610 Gross per 24 hour  Intake 1000 ml  Output 900 ml  Net 100 ml   Filed Weights   05/10/18 0500 05/11/18 0224 05/12/18 0139  Weight: 133.6 kg 134.2 kg 133.3 kg     Physical Examination:   General: Appears in no acute distress  Cardiovascular: S1-S2, regular  Respiratory: Mild rhonchi at lung bases  Abdomen: Abdomen is soft, nontender, no organomegaly  Extremities: Both lower extremity in dressing  Neurologic: Alert, oriented x3, no focal deficit noted.      Data Reviewed: I have personally reviewed following labs and  imaging studies   Recent Results (from the past 240 hour(s))  MRSA PCR Screening     Status: None   Collection Time: 05/06/18 10:29 PM  Result Value Ref Range Status   MRSA by PCR NEGATIVE NEGATIVE Final    Comment:        The GeneXpert MRSA Assay (FDA approved for NASAL specimens only), is one component of a comprehensive MRSA colonization surveillance program. It is not intended to diagnose MRSA infection nor to guide or monitor treatment for MRSA infections. Performed at [facility], [place] [address], [place], [place] [address]   Culture, Urine     Status: Abnormal (Preliminary result)   Collection Time: 05/11/18 10:03 AM  Result Value Ref Range Status   Specimen Description URINE, RANDOM  Final   Special Requests   Final    NONE Performed at [facility], [place] [address], [place], [place] [address]    Culture >=100,000 COLONIES/mL ESCHERICHIA COLI (A)  Final   Report Status PENDING  Incomplete     Liver Function Tests: Recent Labs  Lab 05/06/18 1752  AST 29  ALT 18  ALKPHOS 78  BILITOT 1.0  PROT 6.5  ALBUMIN 3.0*   No results for input(s): LIPASE, AMYLASE in the last 168 hours. No results for input(s): AMMONIA in the last 168 hours.  Cardiac Enzymes: No results for input(s): CKTOTAL, CKMB, CKMBINDEX, TROPONINI in the last 168 hours. BNP (last 3 results) Recent Labs    05/04/18 0953 05/06/18 1752  BNP 1,137.1* 1,077.4*    ProBNP (last 3 results) No results for input(s): PROBNP in the last 8760 hours.    Studies: No results found.  Scheduled Meds: . atorvastatin  40 mg Oral Daily  . buPROPion  150 mg Oral Daily  . clopidogrel  75 mg Oral Daily  . enoxaparin (LOVENOX) injection  40 mg Subcutaneous Q24H  . furosemide  80 mg Intravenous BID  . iron polysaccharides  150 mg Oral Daily  . mouth rinse  15 mL Mouth Rinse BID  . metoprolol tartrate  25 mg Oral BID  . mirabegron ER  25 mg Oral Daily  . multivitamin with minerals  1 tablet  Oral Daily  . pantoprazole  40 mg Oral Daily  . potassium chloride  40 mEq Oral Once  . potassium chloride  20 mEq Oral BID  . sodium chloride flush  3 mL Intravenous Q12H  .  venlafaxine  112.5 mg Oral Q breakfast   And  . venlafaxine  37.5 mg Oral QAC supper    Admission status: Inpatient: Based on patients clinical presentation and evaluation of above clinical data, I have made determination that patient meets Inpatient criteria at this time.  Time spent: 30 min  [facility] Pager [phone]. If 7PM-7AM, please contact night-coverage at [email or web address], Office  [phone]  password [email or web address]  05/12/2018, 10:09 AM  LOS: 6 days

## 2018-05-12 NOTE — Progress Notes (Signed)
Physical Therapy Treatment Patient Details Name: Shannon Carroll MRN: [id number] DOB: 1950-06-23 Today's Date: 05/12/2018    History of Present Illness 68 year old female with a history of hypertension, hyperlipidemia, diet-controlled diabetes mellitus, stroke, depression, CKD stage III, diastolic CHF, aortic valve stenosis came with shortness of breath and bilateral lower extremity edema.  Patient admitted with acute on chronic diastolic CHF.    PT Comments    PT session limited today due to patient fatigue and level of arousal. Patient easily drifts off if not consistently speaking to therapist. Bed level mobility this session with patient requiring max/total A+2 for rolling and peri-care as patient had BM in bed. Patient with difficulty following commands requiring consistent verbal and tactile cueing. Will continue to recommend SNF at discharge.    Follow Up Recommendations  SNF;Supervision for mobility/OOB     Equipment Recommendations  None recommended by PT    Recommendations for Other Services       Precautions / Restrictions Precautions Precautions: Fall Restrictions Weight Bearing Restrictions: No    Mobility  Bed Mobility Overal bed mobility: Needs Assistance Bed Mobility: Rolling Rolling: Max assist;Total assist;+2 for physical assistance         General bed mobility comments: bed level mobility this session due to level of arousal. Patient required total A+2 for pericare as linens were soiled with BM  Transfers                 General transfer comment: deferred  Ambulation/Gait                 Stairs             Wheelchair Mobility    Modified Rankin (Stroke Patients Only)       Balance                                            Cognition Arousal/Alertness: Lethargic Behavior During Therapy: Flat affect Overall Cognitive Status: No family/caregiver present to determine baseline cognitive functioning                                  General Comments: patient demonstrating difficulty following commands and sequencing of tasks - requires consistent verbal and tactile cues throughout      Exercises      General Comments General comments (skin integrity, edema, etc.): patient stating she was SOB, however SpO2 on supplemental O2 97%, HR in the 60's throughout session      Pertinent Vitals/Pain Pain Assessment: No/denies pain    Home Living                      Prior Function            PT Goals (current goals can now be found in the care plan section) Acute Rehab PT Goals Patient Stated Goal: to get stronger PT Goal Formulation: With patient Time For Goal Achievement: 05/23/18 Potential to Achieve Goals: Fair Progress towards PT goals: Not progressing toward goals - comment(level of arousal)    Frequency    Min 2X/week      PT Plan Current plan remains appropriate    Co-evaluation              AM-PAC PT "6 Clicks" Mobility   Outcome Measure  Help  needed turning from your back to your side while in a flat bed without using bedrails?: Total Help needed moving from lying on your back to sitting on the side of a flat bed without using bedrails?: Total Help needed moving to and from a bed to a chair (including a wheelchair)?: Total Help needed standing up from a chair using your arms (e.g., wheelchair or bedside chair)?: Total Help needed to walk in hospital room?: Total Help needed climbing 3-5 steps with a railing? : Total 6 Click Score: 6    End of Session Equipment Utilized During Treatment: Oxygen Activity Tolerance: Patient limited by fatigue;Patient limited by lethargy Patient left: in bed;with call bell/phone within reach;with bed alarm set Nurse Communication: Mobility status PT Visit Diagnosis: Muscle weakness (generalized) (M62.81);Difficulty in walking, not elsewhere classified (R26.2)     Time: 1856-3149 PT Time Calculation  (min) (ACUTE ONLY): 19 min  Charges:  $Therapeutic Activity: 8-22 mins                     Lanney Gins, PT, DPT Supplemental Physical Therapist 05/12/18 12:44 PM Pager: [phone] Office: [phone]

## 2018-05-12 NOTE — TOC Progression Note (Addendum)
Transition of Care [facility]) - Progression Note    Patient Details  Name: Shannon Carroll MRN: [id number] Date of Birth: 12-09-1950  Transition of Care [facility]) CM/SW [place], LCSW Phone Number: 05/12/2018, 9:21 AM  Clinical Narrative: Received message from Ms. Planes that according to patient's sons, [facility] first preference SNF and [facility] is second. [facility] admissions coordinator is reviewing referral and said they might have a bed open up tomorrow. She will know for sure later today. Will follow up with [name] and patient's sons when I receive update from [place].  9:43 am: [facility] unable to offer a bed. CSW called and updated son [name]. He will discuss with [name] and they will notify CSW of next steps.  10:46 am: Per Hassan Rowan, sons have chosen [facility]. Admissions coordinator aware. Patient is still on IV Lasix.  Expected Discharge Plan: Assisted Living Barriers to Discharge: Continued Medical Work up  Expected Discharge Plan and Services Expected Discharge Plan: Assisted Living       Living arrangements for the past 2 months: Assisted Living Facility                           Social Determinants of Health (SDOH) Interventions    Readmission Risk Interventions No flowsheet data found.

## 2018-05-13 DIAGNOSIS — F329 Major depressive disorder, single episode, unspecified: Secondary | ICD-10-CM

## 2018-05-13 LAB — CBC WITH DIFFERENTIAL/PLATELET
Abs Immature Granulocytes: 0.07 10*3/uL (ref 0.00–0.07)
Basophils Absolute: 0 10*3/uL (ref 0.0–0.1)
Basophils Relative: 0 %
Eosinophils Absolute: 0 10*3/uL (ref 0.0–0.5)
Eosinophils Relative: 0 %
HCT: 32.6 % — ABNORMAL LOW (ref 36.0–46.0)
Hemoglobin: 9.8 g/dL — ABNORMAL LOW (ref 12.0–15.0)
Immature Granulocytes: 1 %
Lymphocytes Relative: 10 %
Lymphs Abs: 0.9 10*3/uL (ref 0.7–4.0)
MCH: 26.1 pg (ref 26.0–34.0)
MCHC: 30.1 g/dL (ref 30.0–36.0)
MCV: 86.7 fL (ref 80.0–100.0)
MONOS PCT: 7 %
Monocytes Absolute: 0.7 10*3/uL (ref 0.1–1.0)
Neutro Abs: 8 10*3/uL — ABNORMAL HIGH (ref 1.7–7.7)
Neutrophils Relative %: 82 %
Platelets: 230 10*3/uL (ref 150–400)
RBC: 3.76 MIL/uL — ABNORMAL LOW (ref 3.87–5.11)
RDW: 17.8 % — ABNORMAL HIGH (ref 11.5–15.5)
WBC: 9.7 10*3/uL (ref 4.0–10.5)
nRBC: 0 % (ref 0.0–0.2)

## 2018-05-13 LAB — URINE CULTURE: Culture: >=100000 — AB

## 2018-05-13 LAB — BASIC METABOLIC PANEL
Anion gap: 16 — ABNORMAL HIGH (ref 5–15)
BUN: 37 mg/dL — AB (ref 8–23)
CO2: 28 mmol/L (ref 22–32)
Calcium: 8.2 mg/dL — ABNORMAL LOW (ref 8.9–10.3)
Chloride: 93 mmol/L — ABNORMAL LOW (ref 98–111)
Creatinine, Ser: 1.71 mg/dL — ABNORMAL HIGH (ref 0.44–1.00)
GFR calc Af Amer: 35 mL/min — ABNORMAL LOW (ref >60)
GFR, EST NON AFRICAN AMERICAN: 30 mL/min — AB (ref >60)
GLUCOSE: 114 mg/dL — AB (ref 70–99)
Potassium: 4 mmol/L (ref 3.5–5.1)
Sodium: 137 mmol/L (ref 135–145)

## 2018-05-13 MED ORDER — METOPROLOL SUCCINATE ER 25 MG PO TB24
12.5000 mg | ORAL_TABLET | Freq: Every day | ORAL | Status: DC
  Administered 2018-05-14 – 2018-05-19 (×5): 12.5 mg via ORAL
  Filled 2018-05-13 (×6): qty 1

## 2018-05-13 MED ORDER — HEPARIN SODIUM (PORCINE) 5000 UNIT/ML IJ SOLN
5000.0000 [IU] | Freq: Three times a day (TID) | INTRAMUSCULAR | Status: DC
  Administered 2018-05-13 – 2018-05-19 (×15): 5000 [IU] via SUBCUTANEOUS
  Filled 2018-05-13 (×16): qty 1

## 2018-05-13 MED ORDER — SODIUM CHLORIDE 0.9 % IV BOLUS
250.0000 mL | Freq: Once | INTRAVENOUS | Status: AC
  Administered 2018-05-13: 250 mL via INTRAVENOUS

## 2018-05-13 MED ORDER — SENNOSIDES-DOCUSATE SODIUM 8.6-50 MG PO TABS
1.0000 | ORAL_TABLET | Freq: Two times a day (BID) | ORAL | Status: DC
  Administered 2018-05-13 – 2018-05-19 (×11): 1 via ORAL
  Filled 2018-05-13 (×13): qty 1

## 2018-05-13 MED ORDER — FUROSEMIDE 10 MG/ML IJ SOLN
40.0000 mg | Freq: Two times a day (BID) | INTRAMUSCULAR | Status: DC
  Administered 2018-05-13 – 2018-05-14 (×3): 40 mg via INTRAVENOUS
  Filled 2018-05-13 (×2): qty 4

## 2018-05-13 NOTE — Progress Notes (Signed)
PT BP has been low. MD aware. Got order for bolus. After bolus BP was still low. MD aware. Orders to continue to monitor.

## 2018-05-13 NOTE — Plan of Care (Signed)

## 2018-05-13 NOTE — Progress Notes (Signed)
Physical Therapy Treatment Patient Details Name: RIDA LOUDIN MRN: [id number] DOB: Dec 01, 1950 Today's Date: 05/13/2018    History of Present Illness 68 year old female with a history of hypertension, hyperlipidemia, diet-controlled diabetes mellitus, stroke, depression, CKD stage III, diastolic CHF, aortic valve stenosis came with shortness of breath and bilateral lower extremity edema.  Patient admitted with acute on chronic diastolic CHF.    PT Comments    Pt admitted with above diagnosis. Pt currently with functional limitations due to the deficits listed below (see PT Problem List). Pt was able to sit EOB for 4 min with varying mod to total assist with posterior lean.  Pt very deconditioned.  Cognition limits pt as well.  Pt stated she had to have BM therefore placed pt on bedpan.  Will continue PT.  Pt will benefit from skilled PT to increase their independence and safety with mobility to allow discharge to the venue listed below.     Follow Up Recommendations  SNF;Supervision for mobility/OOB     Equipment Recommendations  None recommended by PT    Recommendations for Other Services       Precautions / Restrictions Precautions Precautions: Fall Restrictions Weight Bearing Restrictions: No    Mobility  Bed Mobility Overal bed mobility: Needs Assistance Bed Mobility: Rolling;Sit to Supine Rolling: Max assist;Total assist;+2 for physical assistance   Supine to sit: Mod assist;+2 for safety/equipment;HOB elevated;Max assist Sit to supine: Total assist;+2 for physical assistance   General bed mobility comments:  Patient required max assist to roll due to pt processing iessues and difficulty moving her LEs and using her UEs.  Pt needed mod assist of 2 to come to EOB and then struggled as she has difficulty with anterior lean.  Total assist to lie down.   Transfers                 General transfer comment: was going to get pt onto 3N1 but could not attempt due to  couldnt sit up without assist  Ambulation/Gait                 Stairs             Wheelchair Mobility    Modified Rankin (Stroke Patients Only) Modified Rankin (Stroke Patients Only) Pre-Morbid Rankin Score: Moderately severe disability Modified Rankin: Severe disability     Balance Overall balance assessment: Needs assistance Sitting-balance support: Bilateral upper extremity supported;Feet supported Sitting balance-Leahy Scale: Poor Sitting balance - Comments: significant posterior lean, unable to maintain sitting balance without UE support or external assist.Only able to sit for 4 min and needing more and more assist.  Told PT that she needed bedpan therefore placed pt on bedpan.   Postural control: Posterior lean                                  Cognition Arousal/Alertness: Awake/alert Behavior During Therapy: Flat affect Overall Cognitive Status: No family/caregiver present to determine baseline cognitive functioning                                 General Comments: patient demonstrating difficulty following commands and sequencing of tasks - requires consistent verbal and tactile cues throughout      Exercises General Exercises - Lower Extremity Heel Slides: AROM;Both;10 reps;Supine    General Comments General comments (skin integrity, edema, etc.): Pt on 3.5  LO2 on arrival.  Desat on rA therefore pt needing O2 at this time.       Pertinent Vitals/Pain Pain Assessment: No/denies pain    Home Living                      Prior Function            PT Goals (current goals can now be found in the care plan section) Acute Rehab PT Goals Patient Stated Goal: to get stronger Progress towards PT goals: Progressing toward goals    Frequency    Min 2X/week      PT Plan Current plan remains appropriate    Co-evaluation              AM-PAC PT "6 Clicks" Mobility   Outcome Measure  Help needed  turning from your back to your side while in a flat bed without using bedrails?: Total Help needed moving from lying on your back to sitting on the side of a flat bed without using bedrails?: Total Help needed moving to and from a bed to a chair (including a wheelchair)?: Total Help needed standing up from a chair using your arms (e.g., wheelchair or bedside chair)?: Total Help needed to walk in hospital room?: Total Help needed climbing 3-5 steps with a railing? : Total 6 Click Score: 6    End of Session Equipment Utilized During Treatment: Oxygen;Gait belt Activity Tolerance: Patient limited by fatigue Patient left: in bed;with call bell/phone within reach;with bed alarm set Nurse Communication: Mobility status(nurse aware that pt was left on bedpan) PT Visit Diagnosis: Muscle weakness (generalized) (M62.81);Difficulty in walking, not elsewhere classified (R26.2)     Time: 1103-1594 PT Time Calculation (min) (ACUTE ONLY): 20 min  Charges:  $Therapeutic Activity: 8-22 mins                     [place] Pager:  [phone]  Office:  [place] 05/13/2018, 12:32 PM

## 2018-05-13 NOTE — Progress Notes (Signed)
rn walked in pt room to do hourly rounding and found pt with eyes rolling in the back of head. Vitals taken. MD paged. Will continue to monitor.

## 2018-05-13 NOTE — Consult Note (Signed)
   [facility] CM Inpatient Consult   05/13/2018  WILHELMINA HARK 1950-05-16 [id number]  Patient screened for high risk score noted and 1st  hospitalizations in 6 months to check if potential [place] Management services are needed with long length of stay. Chart review reveals patient with Medicare and PT is recommending a skilled nursing facility stay.  Patient is eligible for [facility] Care management programs.  Followed up with inpatient Transition of Care social worker for updates.  No current needs noted.  Patient for skilled nursing and then to another Assisted Living facility.  Please place a [facility] Care Management consult or for questions contact:    Natividad Brood, RN BSN [facility] Liaison  [phone] business mobile phone Toll free office [phone]

## 2018-05-13 NOTE — Progress Notes (Signed)
Progress Note  Patient Name: Shannon Carroll Date of Encounter: 05/13/2018  Primary Cardiologist: Minus Breeding, MD   Subjective   Dyspnea improved; no CP  Inpatient Medications    Scheduled Meds: . atorvastatin  40 mg Oral Daily  . buPROPion  150 mg Oral Daily  . clopidogrel  75 mg Oral Daily  . furosemide  80 mg Intravenous BID  . heparin injection (subcutaneous)  5,000 Units Subcutaneous Q8H  . iron polysaccharides  150 mg Oral Daily  . mouth rinse  15 mL Mouth Rinse BID  . metoprolol tartrate  25 mg Oral BID  . mirabegron ER  25 mg Oral Daily  . multivitamin with minerals  1 tablet Oral Daily  . pantoprazole  40 mg Oral Daily  . potassium chloride  40 mEq Oral Once  . potassium chloride  20 mEq Oral BID  . senna-docusate  1 tablet Oral BID  . sodium chloride flush  3 mL Intravenous Q12H  . venlafaxine  112.5 mg Oral Q breakfast   And  . venlafaxine  37.5 mg Oral QAC supper   Continuous Infusions: . sodium chloride 250 mL (05/12/18 1322)  . cefTRIAXone (ROCEPHIN)  IV 1 g (05/12/18 1323)   PRN Meds: sodium chloride, acetaminophen, clonazePAM, dextromethorphan-guaiFENesin, hydrALAZINE, hydrOXYzine, levalbuterol, sodium chloride flush   Vital Signs    Vitals:   05/13/18 0237 05/13/18 0251 05/13/18 0336 05/13/18 0857  BP: (!) 88/58 94/63 96/61  (!) 133/57  Pulse:   63 62  Resp:  20 18   Temp:   98 F (36.7 C) 97.9 F (36.6 C)  TempSrc:   Oral Oral  SpO2:  99% 100% 100%  Weight:   134.2 kg   Height:        Intake/Output Summary (Last 24 hours) at 05/13/2018 1000 Last data filed at 05/13/2018 0400 Gross per 24 hour  Intake 583 ml  Output 750 ml  Net -167 ml   Last 3 Weights 05/13/2018 05/12/2018 05/11/2018  Weight (lbs) 295 lb 13.7 oz 293 lb 14 oz 295 lb 13.7 oz  Weight (kg) 134.2 kg 133.3 kg 134.2 kg      Telemetry    Sinus- Personally Reviewed  Physical Exam   GEN: WD Obese NAD Neck: JVP difficult to assess Cardiac: RRR, 2/6 systolic murmur  Respiratory: CTA GI: Soft, nontender, obese; edema of pannus persists MS: 2+ thigh edema; lower ext wrapped Neuro:  No focal findings  Labs    Chemistry Recent Labs  Lab 05/06/18 1752  05/11/18 0433 05/12/18 0421 05/13/18 0518  NA 141   < > 134* 137 137  K 3.2*   < > 3.8 3.9 4.0  CL 104   < > 90* 92* 93*  CO2 25   < > 32 29 28  GLUCOSE 115*   < > 104* 86 114*  BUN 10   < > 18 26* 37*  CREATININE 1.32*   < > 1.25* 1.54* 1.71*  CALCIUM 8.5*   < > 8.6* 8.6* 8.2*  PROT 6.5  --   --   --   --   ALBUMIN 3.0*  --   --   --   --   AST 29  --   --   --   --   ALT 18  --   --   --   --   ALKPHOS 78  --   --   --   --   BILITOT 1.0  --   --   --   --  GFRNONAA 42*   < > 44* 35* 30*  GFRAA 48*   < > 52* 40* 35*  ANIONGAP 12   < > 12 16* 16*   < > = values in this interval not displayed.     Hematology Recent Labs  Lab 05/06/18 1752 05/07/18 0036 05/12/18 0421 05/13/18 0923  WBC 7.2  --  7.4 9.7  RBC 3.55* 3.37* 3.54* 3.76*  HGB 9.3*  --  8.9* 9.8*  HCT 32.4*  --  30.8* 32.6*  MCV 91.3  --  87.0 86.7  MCH 26.2  --  25.1* 26.1  MCHC 28.7*  --  28.9* 30.1  RDW 17.6*  --  17.5* 17.8*  PLT 285  --  245 230     Recent Labs  Lab 05/06/18 1755  TROPIPOC 0.05     BNP Recent Labs  Lab 05/06/18 1752  BNP 1,077.4*     Patient Profile     68 y.o. female with past medical history of mild aortic stenosis/moderate mitral stenosis, pulmonary hypertension, hypertension, diabetes mellitus, hyperlipidemia, prior stroke, chronic stage III kidney disease, obstructive sleep apnea admitted with acute on chronic diastolic congestive heart failure.  Also noted to have SVT on telemetry.  Assessment & Plan    1 acute on chronic diastolic congestive heart failure-I/O +203; wt 134.2 kg.  She is still volume overloaded on examination.  However BUN/creatinine are increasing.  Blood pressure is borderline.  Decrease Lasix to 40 mg twice daily.  Follow renal function closely.  A large  component of this is likely right heart failure related to pulmonary hypertension from obesity hypoventilation syndrome, sleep apnea and pulmonary venous hypertension.     2 pulmonary hypertension-likely combination of pulmonary venous hypertension related to valvular heart disease as well as obstructive sleep apnea and obesity hypoventilation syndrome.  3 supraventricular tachycardia-no recurrent episodes over the past 24 hours and blood pressure is borderline.  Discontinue Lopressor and treat with Toprol 12.5 mg daily.  Follow blood pressure and adjust regimen as needed.  4 history of moderate mitral stenosis and mild aortic stenosis-we will need follow-up echoes in the future.  5 chronic stage III kidney disease-creatinine increased today.  Decrease Lasix to 40 mg twice daily and follow.  6 obstructive sleep apnea-needs sleep evaluation following discharge.  7 morbid obesity-needs significant weight loss.  Needs physical therapy and begin mobilization.  For questions or updates, please contact [place] Please consult [email or web address] for contact info under        Signed, Kirk Ruths, MD  05/13/2018, 10:00 AM

## 2018-05-13 NOTE — Progress Notes (Addendum)
PROGRESS NOTE    Shannon Carroll  TFT:[id number] DOB: December 17, 1950 DOA: 05/06/2018 PCP: Harlan Stains, MD   Brief Narrative:  The patient is a 68 year old female with a history of hypertension, hyperlipidemia, diet-controlled diabetes mellitus, stroke, depression, CKD stage III, diastolic CHF, aortic valve stenosis and other comorbidities who presented with shortness of breath and bilateral lower extremity edema.  Patient admitted with acute on chronic diastolic CHF. She has been started on Diuresis and has improved significantly but Renal Function started worsening so Cardiology decreased Diuresis today.   Assessment & Plan:   Principal Problem:   Acute on chronic diastolic CHF (congestive heart failure) (HCC) Active Problems:   Hyperlipidemia   Depression   Hypertension   Stroke (HCC)   CKD (chronic kidney disease), stage III (HCC)   Normocytic anemia   Hypokalemia   Acute on chronic diastolic (congestive) heart failure (HCC)  Acute on Chronic Diastolic CHF -Improving, patient presented with dyspnea, leg edema, BNP 1137 on admission and repeat was 1,077. -There was Vascular congestion on chest x-ray.   -2D echo in December 2019 showed grade 2 diastolic dysfunction.  -Strict I's/O's, Daily Weights, and Fluid Restriction to 1500 mL -Cardiology feels that patient has right heart failure and had restarted Lasix 80 mg every 12 hours along with Demadex but now Demadex has been discontinued and Lasix has been reduced to IV 40 mg BID given worsening Renal Function -Patient is -20 lbs from admission and is also -9.023 Liters -Continue to Monitor Volume Status Carefully  -Repeat CXR in AM   Hyperlipidemia -Continue Atorvastatin 40 mg po daily  Hypertension -Blood pressure is stable and is 115/57.   -Not on Amlodipine while currently hospitalized -C/w IV Diuresis per Cardiology as above -C/w Metoprolol Succinate 12.5 mg po Daily  -C/w IV Hydralazine 5 mg IV q2hprn for SBP>175   Depression/Anxiety -Continue Bupropion 150 mg po Daily and Clonazepam 0.5 mg po BIDprn Anxiety -Also will continue with Hydroxyzine 10 mg po Daily PRN -C/w Venlafaxine 112.5 mg po Daily with Breakfast and 37.5 mg po daily with Supper  History of CVA -C/w Clopidogrel 75 mg po Daily and Atorvastatin 40 mg po Daily   AKI on CKD stage III -Creatinine normally at baseline 1.0-1.3.  Today creatinine has worsened and is 1.71 and Secondary to diuresis.  -Cardiology reducing IV Diuresis to 40 mg BID from 80 mg BID and have stopped Demadex as well -Continue to Monitor and Trend Renal Function -Repeat CMP in AM   Hypokalemia -Improved. K+ is now 4.0 -Continue to Monitor and Replete with po KCl 20 mEQ BID while being diuresed  -Repeat CMP in AM   Normocytic Anemia -Patient's Hgb/Hct went from 8.9/30.8 -> 9.8/32.6 -Anemia panel on admission showed an iron level of 21, U IBC of 200, TIBC of 221, saturation ratios of 9%, ferritin level 77, folate level 21.7, and vitamin B12 of 686 -Continue with Niferex 150 mg po Daily  -Continue to monitor for signs and symptoms of bleeding -Repeat CMP in a.m.  ?UTI/Asymptomatic Bacteriuria -Initial U/A showed Clear color appearance, strong color urine, positive nitrites, few bacteria, and 0-5 WBCs -This was repeated on 323 -Patient had abnormal UA and, with no Dysuria currently but ? If she did have dysuria previously -Urine culture has been ordered and showed >100,000 CFU of E Coli that was only resistant to Ampicillin  -She was started IV Rocephin 1 g IV daily and will treat for at least 3 Days given that it is unclear if  she had symptoms or not  SVT -Patient had episode of SVT 2 days ago, currently she is in normal sinus rhythm.  No more episodes of SVT.   -Continue Metoprolol Succinate 12.5 mg po Daily and IV Lopressor has been stopped  -C/w Telemetry  Morbid Obesity -Estimated body mass index is 44.98 kg/m as calculated from the following:    Height as of this encounter: _0  (1.727 m).   Weight as of this encounter: 134.2 kg. -Weight Loss and Dietary Counseling Given   Mitral Valve and Aortic Valve Stenosis -Per Cardiology will need Follow up ECHOes in the Future  Pulmonary HTN -Likely related to Valvular Heart Disease as well as OSA/OHS -Diuresis per Cardiology  Suspected OSA -Needs outpatient Sleep Studying   Increased AG -Patient's AG was 16 -In the setting of Diuresis  Hyperglycemia in the setting of Diet Controlled Diabetes -Patient's CBG ranging from 86-116 -Checked HbA1c last week was 5.2 -Continue to Monitor and If blood Sugars consistently elevated then will place on Sensitive Novolog SSI AC  DVT prophylaxis: Enoxaparin 40 mg sq q24h to be D/C'd given worsening Renal Fxn and will start Heparin 5,000 units sq q8h Code Status: FULL CODE Family Communication: No family present at bedside  Disposition Plan: SNF when medically stable and back to baseline  Consultants:   Cardiology Dr. Kirk Ruths  Procedures:  ECHOCARDIOGRAM 05/07/2018 IMPRESSIONS    1. The left ventricle has hyperdynamic systolic function, with an ejection fraction of >65%. The cavity size was normal. There is mildly increased left ventricular wall thickness. Left ventricular diastolic Doppler parameters are indeterminate.  Indeterminate filling pressures There is incoordinate septal motion.  2. The right ventricle has mildly reduced systolic function. The cavity was mildly enlarged. There is no increase in right ventricular wall thickness.  3. Left atrial size was moderately dilated.  4. Right atrial size was mildly dilated.  5. The mitral valve is degenerative. Mild thickening of the mitral valve leaflet. Moderate calcification of the mitral valve leaflet. There is moderate to severe mitral annular calcification present. Moderate mitral valve stenosis.  6. The tricuspid valve is grossly normal.  7. The aortic valve was not well  visualized.  8. The aortic root and ascending aorta are normal in size and structure.  9. The inferior vena cava was dilated in size with <50% respiratory variability. 10. The interatrial septum was not well visualized. 11. When compared to the prior study: 01/29/2018: LVEF 65-70%, moderate LAE, moderate mitral stenosis.  SUMMARY   LVEF 65-70%, incoordinate septal motion, mild LVH, mildly reduced RV systolic function, moderate LAE, mild RA, heavy MAC with moderate mitral stenosis and trivial MR, mild TR, RVSP 53 mmHg, dilated IVC  FINDINGS  Left Ventricle: The left ventricle has hyperdynamic systolic function, with an ejection fraction of >65%. The cavity size was normal. There is mildly increased left ventricular wall thickness. Left ventricular diastolic Doppler parameters are  indeterminate. Indeterminate filling pressures There is incoordinate septal motion. Definity contrast agent was given IV to delineate the left ventricular endocardial borders. Right Ventricle: The right ventricle has mildly reduced systolic function. The cavity was mildly enlarged. There is no increase in right ventricular wall thickness. Left Atrium: left atrial size was moderately dilated Right Atrium: right atrial size was mildly dilated. Right atrial pressure is estimated at 15 mmHg. Interatrial Septum: The interatrial septum was not well visualized. Pericardium: There is no evidence of pericardial effusion. Mitral Valve: The mitral valve is degenerative in appearance. Mild thickening of  the mitral valve leaflet. Moderate calcification of the mitral valve leaflet. There is moderate to severe mitral annular calcification present. Mitral valve regurgitation is  trivial by color flow Doppler. Moderate mitral valve stenosis. Tricuspid Valve: The tricuspid valve is grossly normal. Tricuspid valve regurgitation is mild by color flow Doppler. Aortic Valve: The aortic valve was not well visualized Aortic valve  regurgitation was not visualized by color flow Doppler. There is no evidence of aortic valve stenosis. Pulmonic Valve: The pulmonic valve was not well visualized. Pulmonic valve regurgitation is not visualized by color flow Doppler. Aorta: The aortic root and ascending aorta are normal in size and structure. Venous: The inferior vena cava measures 2.60 cm, is dilated in size with less than 50% respiratory variability. Compared to previous exam: 01/29/2018: LVEF 65-70%, moderate LAE, moderate mitral stenosis.   LEFT VENTRICLE PLAX 2D LVIDd:         4.59 cm LVIDs:         3.51 cm LV PW:         1.14 cm LV IVS:        1.05 cm LVOT diam:     2.20 cm LV SV:         46 ml LV SV Index:   17.09 LVOT Area:     3.80 cm  RIGHT VENTRICLE RVSP:           52.9 mmHg  LEFT ATRIUM         Index      RIGHT ATRIUM LA diam:    3.60 cm 1.46 cm/m RA Pressure: 15 mmHg  AORTIC VALVE LVOT Vmax:   56.20 cm/s LVOT Vmean:  36.900 cm/s LVOT VTI:    0.100 m   AORTA Ao Root diam: 2.60 cm  MITRAL VALVE             TRICUSPID VALVE MV Peak grad: 20.8 mmHg  TR Peak grad:   37.9 mmHg MV Mean grad: 8.0 mmHg   TR Vmax:        308.00 cm/s MV Vmax:      2.28 m/s   RVSP:           52.9 mmHg MV Vmean:     135.0 cm/s MV VTI:       0.51 m     SHUNTS                          Systemic VTI:  0.10 m                          Systemic Diam: 2.20 cm  IVC IVC diam: 2.60 cm   Antimicrobials:  Anti-infectives (From admission, onward)   Start     Dose/Rate Route Frequency Ordered Stop   05/12/18 1100  cefTRIAXone (ROCEPHIN) 1 g in sodium chloride 0.9 % 100 mL IVPB     1 g 200 mL/hr over 30 Minutes Intravenous Every 24 hours 05/12/18 1012       Subjective: Seen and examined at bedside states that her shortness of breath has much improved since coming in.  No lightheadedness or dizziness.  Surprised at home where she is diuresed and lost weight.  Feels better since coming in.  No other concerns or complaints at  this time.  Objective: Vitals:   05/13/18 0119 05/13/18 0237 05/13/18 0251 05/13/18 0336  BP: (!) 95/57 (!) _0  Pulse:    63  Resp:   20 18  Temp:    98 F (36.7 C)  TempSrc:    Oral  SpO2:   99% 100%  Weight:    134.2 kg  Height:        Intake/Output Summary (Last 24 hours) at 05/13/2018 0831 Last data filed at 05/13/2018 0400 Gross per 24 hour  Intake 903 ml  Output 900 ml  Net 3 ml   Filed Weights   05/11/18 0224 05/12/18 0139 05/13/18 0336  Weight: 134.2 kg 133.3 kg 134.2 kg   Examination: Physical Exam:  Constitutional: WN/WD morbidly obese Caucasian female in NAD and appears calm and comfortable Eyes: Lids and conjunctivae normal, sclerae anicteric  ENMT: External Ears, Nose appear normal. Grossly normal hearing. Mucous membranes are moist.  Neck: Appears normal, supple, no cervical masses, normal ROM, no appreciable thyromegaly; no JVD Respiratory: Diminished to auscultation bilaterally, no wheezing, rales, rhonchi or crackles. Normal respiratory effort and patient is not tachypenic. No accessory muscle use.  Cardiovascular: RRR, no murmurs / rubs / gallops. S1 and S2 auscultated. 1+ LE extremity edema.  Abdomen: Soft, non-tender, Distended 2/2 to Body habitus. No masses palpated. No appreciable hepatosplenomegaly. Bowel sounds positive x4.  GU: Deferred. Musculoskeletal: No clubbing / cyanosis of digits/nails. No joint deformity upper and lower extremities.   Skin: No rashes, lesions, ulcers on a limited skin evaluation. No induration; Warm and dry.  Neurologic: CN 2-12 grossly intact with no focal deficits.  Romberg sign and cerebellar reflexes not assessed.  Psychiatric: Normal judgment and insight. Alert and oriented x 3. Normal mood and appropriate affect.   Data Reviewed: I have personally reviewed following labs and imaging studies  CBC: Recent Labs  Lab 05/06/18 1752 05/12/18 0421  WBC 7.2 7.4  NEUTROABS 5.7  --   HGB 9.3* 8.9*  HCT  32.4* 30.8*  MCV 91.3 87.0  PLT 285 188   Basic Metabolic Panel: Recent Labs  Lab 05/06/18 1920  05/09/18 0306 05/10/18 0439 05/10/18 1451 05/11/18 0433 05/12/18 0421 05/13/18 0518  NA  --    < > 138 137  --  134* 137 137  K  --    < > 2.8* 3.2* 3.7 3.8 3.9 4.0  CL  --    < > 92* 91*  --  90* 92* 93*  CO2  --    < > 34* 34*  --  32 29 28  GLUCOSE  --    < > 120* 105*  --  104* 86 114*  BUN  --    < > 11 14  --  18 26* 37*  CREATININE  --    < > 1.22* 1.21*  --  1.25* 1.54* 1.71*  CALCIUM  --    < > 8.4* 8.5*  --  8.6* 8.6* 8.2*  MG 1.8  --   --   --  2.0  --   --   --    < > = values in this interval not displayed.   GFR: Estimated Creatinine Clearance: 46.4 mL/min (A) (by C-G formula based on SCr of 1.71 mg/dL (H)). Liver Function Tests: Recent Labs  Lab 05/06/18 1752  AST 29  ALT 18  ALKPHOS 78  BILITOT 1.0  PROT 6.5  ALBUMIN 3.0*   No results for input(s): LIPASE, AMYLASE in the last 168 hours. No results for input(s): AMMONIA in the last 168 hours. Coagulation Profile: No results for input(s): INR, PROTIME in the last 168 hours. Cardiac Enzymes: No  results for input(s): CKTOTAL, CKMB, CKMBINDEX, TROPONINI in the last 168 hours. BNP (last 3 results) No results for input(s): PROBNP in the last 8760 hours. HbA1C: No results for input(s): HGBA1C in the last 72 hours. CBG: Recent Labs  Lab 05/07/18 0601 05/10/18 0621  GLUCAP 84 101*   Lipid Profile: No results for input(s): CHOL, HDL, LDLCALC, TRIG, CHOLHDL, LDLDIRECT in the last 72 hours. Thyroid Function Tests: No results for input(s): TSH, T4TOTAL, FREET4, T3FREE, THYROIDAB in the last 72 hours. Anemia Panel: No results for input(s): VITAMINB12, FOLATE, FERRITIN, TIBC, IRON, RETICCTPCT in the last 72 hours. Sepsis Labs: No results for input(s): PROCALCITON, LATICACIDVEN in the last 168 hours.  Recent Results (from the past 240 hour(s))  MRSA PCR Screening     Status: None   Collection Time: 05/06/18  10:29 PM  Result Value Ref Range Status   MRSA by PCR NEGATIVE NEGATIVE Final    Comment:        The GeneXpert MRSA Assay (FDA approved for NASAL specimens only), is one component of a comprehensive MRSA colonization surveillance program. It is not intended to diagnose MRSA infection nor to guide or monitor treatment for MRSA infections. Performed at [facility], [place] [address] [place], [place] [address]   Culture, Urine     Status: Abnormal   Collection Time: 05/11/18 10:03 AM  Result Value Ref Range Status   Specimen Description URINE, RANDOM  Final   Special Requests   Final    NONE Performed at [facility], [place] [address], [place], [place] [address]    Culture >=100,000 COLONIES/mL ESCHERICHIA COLI (A)  Final   Report Status 05/13/2018 FINAL  Final   Organism ID, Bacteria ESCHERICHIA COLI (A)  Final      Susceptibility   Escherichia coli - MIC*    AMPICILLIN 16 INTERMEDIATE Intermediate     CEFAZOLIN <=4 SENSITIVE Sensitive     CEFTRIAXONE <=1 SENSITIVE Sensitive     CIPROFLOXACIN <=0.25 SENSITIVE Sensitive     GENTAMICIN <=1 SENSITIVE Sensitive     IMIPENEM <=0.25 SENSITIVE Sensitive     NITROFURANTOIN <=16 SENSITIVE Sensitive     TRIMETH/SULFA <=20 SENSITIVE Sensitive     AMPICILLIN/SULBACTAM 8 SENSITIVE Sensitive     PIP/TAZO 8 SENSITIVE Sensitive     Extended ESBL NEGATIVE Sensitive     * >=100,000 COLONIES/mL ESCHERICHIA COLI      Radiology Studies: No results found.  Scheduled Meds: . atorvastatin  40 mg Oral Daily  . buPROPion  150 mg Oral Daily  . clopidogrel  75 mg Oral Daily  . enoxaparin (LOVENOX) injection  40 mg Subcutaneous Q24H  . furosemide  80 mg Intravenous BID  . iron polysaccharides  150 mg Oral Daily  . mouth rinse  15 mL Mouth Rinse BID  . metoprolol tartrate  25 mg Oral BID  . mirabegron ER  25 mg Oral Daily  . multivitamin with minerals  1 tablet Oral Daily  . pantoprazole  40 mg Oral Daily  . potassium chloride  40  mEq Oral Once  . potassium chloride  20 mEq Oral BID  . sodium chloride flush  3 mL Intravenous Q12H  . venlafaxine  112.5 mg Oral Q breakfast   And  . venlafaxine  37.5 mg Oral QAC supper   Continuous Infusions: . sodium chloride 250 mL (05/12/18 1322)  . cefTRIAXone (ROCEPHIN)  IV 1 g (05/12/18 1323)    LOS: 7 days   [facility],  DO [facility] PAGER is on AMION  If 7PM-7AM, please contact night-coverage [email or web address] Password [facility] 05/13/2018, 8:31 AM

## 2018-05-13 NOTE — Progress Notes (Signed)
Occupational Therapy Treatment Patient Details Name: Shannon Carroll MRN: [id number] DOB: 11-17-1950 Today's Date: 05/13/2018    History of present illness 68 year old female with a history of hypertension, hyperlipidemia, diet-controlled diabetes mellitus, stroke, depression, CKD stage III, diastolic CHF, aortic valve stenosis came with shortness of breath and bilateral lower extremity edema.  Patient admitted with acute on chronic diastolic CHF.   OT comments  Performed bed mobility and UE exercises today.  Pt needed more assistance with beverage today--could not problem solve to turn straw to get to mouth.  Had difficulty following motoric commands also  Follow Up Recommendations  SNF    Equipment Recommendations  None recommended by OT    Recommendations for Other Services      Precautions / Restrictions Precautions Precautions: Fall Restrictions Weight Bearing Restrictions: No       Mobility Bed Mobility Overal bed mobility: Needs Assistance Bed Mobility: Rolling;Sit to Supine Rolling: Mod assist;Max assist       General bed mobility comments: assist to bend each leg up to roll; used bedrails and therapist assisted with chux pad.  Rolled L and R as precursor for adls  Transfers                  This section completed by PT:   Balance Overall balance assessment: Needs assistance Sitting-balance support: Bilateral upper extremity supported;Feet supported Sitting balance-Leahy Scale: Poor Sitting balance - Comments: significant posterior lean, unable to maintain sitting balance without UE support or external assist.Only able to sit for 4 min and needing more and more assist.  Told PT that she needed bedpan therefore placed pt on bedpan.   Postural control: Posterior lean                                 ADL either performed or assessed with clinical judgement   ADL   Eating/Feeding: Minimal assistance(set up; manipulated straw direction)                                           Vision       Perception     Praxis      Cognition Arousal/Alertness: Awake/alert Behavior During Therapy: Flat affect Overall Cognitive Status: No family/caregiver present to determine baseline cognitive functioning                                 General Comments: pt had difficulty following motor commands; tactile cues needed to bend leg up to roll and perform UE exercises        Exercises Exercises: Other exercises  Other Exercises Other Exercises: performed bil biceps with level 2 theraband with therapist holding other end as pt could not manage this cognitively, 2 sets of 5 each arm Other Exercises: AROM 2 sets of 5 FF and horizontal abd/add   Shoulder Instructions       General Comments sats 97%    Pertinent Vitals/ Pain       Pain Assessment: No/denies pain  Home Living                                          Prior Functioning/Environment  Frequency  Min 2X/week        Progress Toward Goals  OT Goals(current goals can now be found in the care plan section)  Progress towards OT goals: Not progressing toward goals - comment(slight decrease in self feeding since eval)  Acute Rehab OT Goals Patient Stated Goal: to get stronger  Plan Discharge plan remains appropriate    Co-evaluation                 AM-PAC OT "6 Clicks" Daily Activity     Outcome Measure   Help from another person eating meals?: A Little Help from another person taking care of personal grooming?: A Little Help from another person toileting, which includes using toliet, bedpan, or urinal?: Total Help from another person bathing (including washing, rinsing, drying)?: A Lot Help from another person to put on and taking off regular upper body clothing?: A Little Help from another person to put on and taking off regular lower body clothing?: Total 6 Click Score: 13    End  of Session    OT Visit Diagnosis: Unsteadiness on feet (R26.81);Other abnormalities of gait and mobility (R26.89);Muscle weakness (generalized) (M62.81);Other symptoms and signs involving cognitive function   Activity Tolerance  limited by fatique   Patient Left  in bed with call bell within reach   Nurse Communication          Time: 1241-1306 OT Time Calculation (min): 25 min  Charges: OT General Charges $OT Visit: 1 Visit OT Treatments $Therapeutic Activity: 8-22 mins $Therapeutic Exercise: 8-22 mins  Lesle Chris, OTR/L Acute Rehabilitation Services [phone] [place] pager [phone] office 05/13/2018   [place] 05/13/2018, 1:18 PM

## 2018-05-14 ENCOUNTER — Inpatient Hospital Stay (HOSPITAL_COMMUNITY): Payer: Medicare Other

## 2018-05-14 DIAGNOSIS — R945 Abnormal results of liver function studies: Secondary | ICD-10-CM

## 2018-05-14 DIAGNOSIS — R0602 Shortness of breath: Secondary | ICD-10-CM

## 2018-05-14 DIAGNOSIS — I509 Heart failure, unspecified: Secondary | ICD-10-CM

## 2018-05-14 DIAGNOSIS — R7989 Other specified abnormal findings of blood chemistry: Secondary | ICD-10-CM

## 2018-05-14 LAB — CBC WITH DIFFERENTIAL/PLATELET
Abs Immature Granulocytes: 0.08 10*3/uL — ABNORMAL HIGH (ref 0.00–0.07)
Basophils Absolute: 0.1 10*3/uL (ref 0.0–0.1)
Basophils Relative: 1 %
EOS ABS: 0.1 10*3/uL (ref 0.0–0.5)
Eosinophils Relative: 1 %
HCT: 30.7 % — ABNORMAL LOW (ref 36.0–46.0)
Hemoglobin: 9.4 g/dL — ABNORMAL LOW (ref 12.0–15.0)
Immature Granulocytes: 1 %
Lymphocytes Relative: 9 %
Lymphs Abs: 0.8 10*3/uL (ref 0.7–4.0)
MCH: 26.4 pg (ref 26.0–34.0)
MCHC: 30.6 g/dL (ref 30.0–36.0)
MCV: 86.2 fL (ref 80.0–100.0)
Monocytes Absolute: 0.7 10*3/uL (ref 0.1–1.0)
Monocytes Relative: 8 %
Neutro Abs: 6.9 10*3/uL (ref 1.7–7.7)
Neutrophils Relative %: 80 %
Platelets: 202 10*3/uL (ref 150–400)
RBC: 3.56 MIL/uL — AB (ref 3.87–5.11)
RDW: 17.9 % — ABNORMAL HIGH (ref 11.5–15.5)
WBC: 8.6 10*3/uL (ref 4.0–10.5)
nRBC: 0.2 % (ref 0.0–0.2)

## 2018-05-14 LAB — COMPREHENSIVE METABOLIC PANEL
ALT: 654 U/L — ABNORMAL HIGH (ref 0–44)
ANION GAP: 14 (ref 5–15)
AST: 1049 U/L — ABNORMAL HIGH (ref 15–41)
Albumin: 2.7 g/dL — ABNORMAL LOW (ref 3.5–5.0)
Alkaline Phosphatase: 185 U/L — ABNORMAL HIGH (ref 38–126)
BUN: 36 mg/dL — ABNORMAL HIGH (ref 8–23)
CO2: 30 mmol/L (ref 22–32)
Calcium: 8 mg/dL — ABNORMAL LOW (ref 8.9–10.3)
Chloride: 94 mmol/L — ABNORMAL LOW (ref 98–111)
Creatinine, Ser: 1.64 mg/dL — ABNORMAL HIGH (ref 0.44–1.00)
GFR calc non Af Amer: 32 mL/min — ABNORMAL LOW (ref >60)
GFR, EST AFRICAN AMERICAN: 37 mL/min — AB (ref >60)
Glucose, Bld: 113 mg/dL — ABNORMAL HIGH (ref 70–99)
Potassium: 3.7 mmol/L (ref 3.5–5.1)
Sodium: 138 mmol/L (ref 135–145)
Total Bilirubin: 1.6 mg/dL — ABNORMAL HIGH (ref 0.3–1.2)
Total Protein: 6.1 g/dL — ABNORMAL LOW (ref 6.5–8.1)

## 2018-05-14 LAB — LIPASE, BLOOD: Lipase: 34 U/L (ref 11–51)

## 2018-05-14 LAB — MAGNESIUM: MAGNESIUM: 2.1 mg/dL (ref 1.7–2.4)

## 2018-05-14 LAB — GAMMA GT: GGT: 130 U/L — ABNORMAL HIGH (ref 7–50)

## 2018-05-14 LAB — PHOSPHORUS: Phosphorus: 4 mg/dL (ref 2.5–4.6)

## 2018-05-14 MED ORDER — LEVALBUTEROL HCL 0.63 MG/3ML IN NEBU
0.6300 mg | INHALATION_SOLUTION | Freq: Four times a day (QID) | RESPIRATORY_TRACT | Status: DC
  Administered 2018-05-14 – 2018-05-15 (×2): 0.63 mg via RESPIRATORY_TRACT
  Filled 2018-05-14 (×2): qty 3

## 2018-05-14 MED ORDER — IPRATROPIUM BROMIDE 0.02 % IN SOLN
0.5000 mg | Freq: Four times a day (QID) | RESPIRATORY_TRACT | Status: DC
  Administered 2018-05-14 – 2018-05-15 (×2): 0.5 mg via RESPIRATORY_TRACT
  Filled 2018-05-14 (×2): qty 2.5

## 2018-05-14 MED ORDER — LEVALBUTEROL HCL 0.63 MG/3ML IN NEBU: 0.6300 mg | INHALATION_SOLUTION | Freq: Four times a day (QID) | RESPIRATORY_TRACT | Status: DC

## 2018-05-14 MED ORDER — IPRATROPIUM BROMIDE 0.02 % IN SOLN: 0.5000 mg | Freq: Four times a day (QID) | RESPIRATORY_TRACT | Status: DC

## 2018-05-14 NOTE — Progress Notes (Signed)
PROGRESS NOTE    Shannon Carroll  FIE:[id number] DOB: 04-19-1950 DOA: 05/06/2018 PCP: Harlan Stains, MD   Brief Narrative:  The patient is a 68 year old female with a history of hypertension, hyperlipidemia, diet-controlled diabetes mellitus, stroke, depression, CKD stage III, diastolic CHF, aortic valve stenosis and other comorbidities who presented with shortness of breath and bilateral lower extremity edema.  Patient admitted with acute on chronic diastolic CHF. She has been started on Diuresis and has improved significantly but Renal Function started worsening so Cardiology decreased Diuresis yesterday and Cr slightly improved. Hospitalization has also been complicated by Abnormal LFT's so workup has been initiated with RUQ U/S and Acute Hepatitis Panel.    Assessment & Plan:   Principal Problem:   Acute on chronic diastolic CHF (congestive heart failure) (HCC) Active Problems:   Hyperlipidemia   Depression   Hypertension   Stroke (HCC)   CKD (chronic kidney disease), stage III (HCC)   Normocytic anemia   Hypokalemia   Acute on chronic diastolic (congestive) heart failure (HCC)  Acute on Chronic Diastolic CHF, improving  -Improving, patient presented with dyspnea, leg edema, BNP 1137 on admission and repeat was 1,077. -There was Vascular congestion on chest x-ray.   -2D echo in December 2019 showed grade 2 diastolic dysfunction.  -Strict I's/O's, Daily Weights, and Fluid Restriction to 1500 mL -Cardiology feels that patient has right heart failure and had restarted Lasix 80 mg every 12 hours along with Demadex but now Demadex has been discontinued and Lasix has been reduced to IV 40 mg BID given worsening Renal Function and will be continued today  -Patient is -20 lbs from admission (Not done today) and is also -9.179 Liters -Continue to Monitor Volume Status Carefully  -Repeat CXR this AM showed "Cardiac enlargement with mild progression of vascular congestion. Progression of  bibasilar atelectasis and small effusions. Atherosclerotic ascending aorta and aortic arch." -Repeat CXR in AM   Hyperlipidemia -Will Discontinue Atorvastatin 40 mg po daily given Abnormal LFTs  Hypertension -Blood pressure is stable and is 119/61.   -Not on Amlodipine while currently hospitalized -C/w IV Diuresis per Cardiology as above -C/w Metoprolol Succinate 12.5 mg po Daily  -C/w IV Hydralazine 5 mg IV q2hprn for SBP>175  Depression/Anxiety -Continue Bupropion 150 mg po Daily and Clonazepam 0.5 mg po BIDprn Anxiety -Also will continue with Hydroxyzine 10 mg po Daily PRN -C/w Venlafaxine 112.5 mg po Daily with Breakfast and 37.5 mg po daily with Supper  History of CVA -C/w Clopidogrel 75 mg po Daily and Atorvastatin 40 mg po Daily   AKI on CKD stage III -Creatinine normally at baseline 1.0-1.3.  Creatinine had worsened and was 1.71 and Secondary to diuresis.  -Cardiology reducing IV Diuresis to 40 mg BID from 80 mg BID and have stopped Demadex as well -BUN/Cr slightly improved to 36/1.64 -Continue to Monitor and Trend Renal Function -Repeat CMP in AM   Hypokalemia -Improved. K+ is now 3.7 -Continue to Monitor and Replete with po KCl 20 mEQ BID while being diuresed  -Repeat CMP in AM   Normocytic Anemia -Patient's Hgb/Hct went from 8.9/30.8 -> 9.8/32.6 -> 9.4/30.7 -Anemia panel on admission showed an iron level of 21, U IBC of 200, TIBC of 221, saturation ratios of 9%, ferritin level 77, folate level 21.7, and vitamin B12 of 686 -Continue with Niferex 150 mg po Daily  -Continue to monitor for signs and symptoms of bleeding -Repeat CMP in a.m.  ?UTI/Asymptomatic Bacteriuria -Initial U/A showed Clear color appearance, strong color  urine, positive nitrites, few bacteria, and 0-5 WBCs -This was repeated on 323 -Patient had abnormal UA and, with no Dysuria currently but ? If she did have dysuria previously -Urine culture has been ordered and showed >100,000 CFU of E  Coli that was only resistant to Ampicillin  -She was started IV Rocephin 1 g IV daily and will treat for at least 3 Days given that it is unclear if she had symptoms or not (Day 3/3 today)  SVT -Patient had episode of SVT 2 days ago, currently she is in normal sinus rhythm.  No more episodes of SVT.   -Continue Metoprolol Succinate 12.5 mg po Daily and IV Lopressor has been stopped  -C/w Telemetry  Morbid Obesity -Estimated body mass index is 44.98 kg/m as calculated from the following:   Height as of this encounter: _0  (1.727 m).   Weight as of this encounter: 134.2 kg. -Weight Loss and Dietary Counseling Given   Mitral Valve and Aortic Valve Stenosis -Per Cardiology will need Follow up ECHOes in the Future  Pulmonary HTN -Likely related to Valvular Heart Disease as well as OSA/OHS -Diuresis per Cardiology as above   Suspected OSA -Needs outpatient Sleep Studying   Increased AG -Patient's AG was 16 and now improved to 14 -In the setting of Diuresis  Hyperglycemia in the setting of Diet Controlled Diabetes -Patient's CBG ranging from 86-116 -Checked HbA1c last week was 5.2 -Continue to Monitor and If blood Sugars consistently elevated then will place on Sensitive Novolog SSI AC  Abnormal LFT's -Patient's LFTs were normal on 3/18 and severely worsened -AST went from 29 -> 1,049 and ALT went from 18 -> 654 -? If Patient has NASH given her Body habitus  -Discontinued Atorvastatin and Acetaminophen and Not complaining of any Abdominal Pain -Is on Heparin 5,000 units sq q8h and was changed yesterday from Enoxaparin -Will also check GGT and Lipase Level  -Check RUQ U/S and Acute Hepatitis Panel -Continue to Monitor and Trend Hepatic Fxn -Repeat CMP  Hyperbilirubinemia  -Patient's T Bili was 1.6 this AM -Check RUQ U/S and Acute Hepatitis Panel -Continue to Monitor and Repeat CMP in AM   DVT prophylaxis: Enoxaparin 40 mg sq q24h to be D/C'd given worsening Renal Fxn and  was started on Heparin 5,000 units sq q8h Code Status: FULL CODE Family Communication: No family present at bedside  Disposition Plan: SNF when medically stable and back to baseline  Consultants:   Cardiology Dr. Kirk Ruths  Procedures:  ECHOCARDIOGRAM 05/07/2018 IMPRESSIONS    1. The left ventricle has hyperdynamic systolic function, with an ejection fraction of >65%. The cavity size was normal. There is mildly increased left ventricular wall thickness. Left ventricular diastolic Doppler parameters are indeterminate.  Indeterminate filling pressures There is incoordinate septal motion.  2. The right ventricle has mildly reduced systolic function. The cavity was mildly enlarged. There is no increase in right ventricular wall thickness.  3. Left atrial size was moderately dilated.  4. Right atrial size was mildly dilated.  5. The mitral valve is degenerative. Mild thickening of the mitral valve leaflet. Moderate calcification of the mitral valve leaflet. There is moderate to severe mitral annular calcification present. Moderate mitral valve stenosis.  6. The tricuspid valve is grossly normal.  7. The aortic valve was not well visualized.  8. The aortic root and ascending aorta are normal in size and structure.  9. The inferior vena cava was dilated in size with <50% respiratory variability. 10. The interatrial  septum was not well visualized. 11. When compared to the prior study: 01/29/2018: LVEF 65-70%, moderate LAE, moderate mitral stenosis.  SUMMARY   LVEF 65-70%, incoordinate septal motion, mild LVH, mildly reduced RV systolic function, moderate LAE, mild RA, heavy MAC with moderate mitral stenosis and trivial MR, mild TR, RVSP 53 mmHg, dilated IVC  FINDINGS  Left Ventricle: The left ventricle has hyperdynamic systolic function, with an ejection fraction of >65%. The cavity size was normal. There is mildly increased left ventricular wall thickness. Left ventricular diastolic  Doppler parameters are  indeterminate. Indeterminate filling pressures There is incoordinate septal motion. Definity contrast agent was given IV to delineate the left ventricular endocardial borders. Right Ventricle: The right ventricle has mildly reduced systolic function. The cavity was mildly enlarged. There is no increase in right ventricular wall thickness. Left Atrium: left atrial size was moderately dilated Right Atrium: right atrial size was mildly dilated. Right atrial pressure is estimated at 15 mmHg. Interatrial Septum: The interatrial septum was not well visualized. Pericardium: There is no evidence of pericardial effusion. Mitral Valve: The mitral valve is degenerative in appearance. Mild thickening of the mitral valve leaflet. Moderate calcification of the mitral valve leaflet. There is moderate to severe mitral annular calcification present. Mitral valve regurgitation is  trivial by color flow Doppler. Moderate mitral valve stenosis. Tricuspid Valve: The tricuspid valve is grossly normal. Tricuspid valve regurgitation is mild by color flow Doppler. Aortic Valve: The aortic valve was not well visualized Aortic valve regurgitation was not visualized by color flow Doppler. There is no evidence of aortic valve stenosis. Pulmonic Valve: The pulmonic valve was not well visualized. Pulmonic valve regurgitation is not visualized by color flow Doppler. Aorta: The aortic root and ascending aorta are normal in size and structure. Venous: The inferior vena cava measures 2.60 cm, is dilated in size with less than 50% respiratory variability. Compared to previous exam: 01/29/2018: LVEF 65-70%, moderate LAE, moderate mitral stenosis.   LEFT VENTRICLE PLAX 2D LVIDd:         4.59 cm LVIDs:         3.51 cm LV PW:         1.14 cm LV IVS:        1.05 cm LVOT diam:     2.20 cm LV SV:         46 ml LV SV Index:   17.09 LVOT Area:     3.80 cm  RIGHT VENTRICLE RVSP:           52.9 mmHg  LEFT  ATRIUM         Index      RIGHT ATRIUM LA diam:    3.60 cm 1.46 cm/m RA Pressure: 15 mmHg  AORTIC VALVE LVOT Vmax:   56.20 cm/s LVOT Vmean:  36.900 cm/s LVOT VTI:    0.100 m   AORTA Ao Root diam: 2.60 cm  MITRAL VALVE             TRICUSPID VALVE MV Peak grad: 20.8 mmHg  TR Peak grad:   37.9 mmHg MV Mean grad: 8.0 mmHg   TR Vmax:        308.00 cm/s MV Vmax:      2.28 m/s   RVSP:           52.9 mmHg MV Vmean:     135.0 cm/s MV VTI:       0.51 m     SHUNTS  Systemic VTI:  0.10 m                          Systemic Diam: 2.20 cm  IVC IVC diam: 2.60 cm   Antimicrobials:  Anti-infectives (From admission, onward)   Start     Dose/Rate Route Frequency Ordered Stop   05/12/18 1100  cefTRIAXone (ROCEPHIN) 1 g in sodium chloride 0.9 % 100 mL IVPB     1 g 200 mL/hr over 30 Minutes Intravenous Every 24 hours 05/12/18 1012       Subjective: Seen and examined at bedside states that her breathing was improved but still requires oxygen as well as on 3 L this morning.  I have asked the nurse to wean her down as she does not wear any home oxygen at all.  Discussed with her that her LFTs were elevated and will require further workup. No CP, Nausea, or Vomiting. SOB is stable and swelling is improving. No other concerns or complaints at this time.   Objective: Vitals:   05/13/18 1843 05/13/18 1910 05/14/18 0448 05/14/18 0842  BP: (!) 94/47 (!) 105/57 (!) 110/55 119/61  Pulse: 62 67 68 67  Resp:  18 18   Temp: (!) 97.5 F (36.4 C) 97.7 F (36.5 C) 97.7 F (36.5 C) 97.7 F (36.5 C)  TempSrc: Oral Oral Oral Oral  SpO2: 100% 97% 97% 100%  Weight:      Height:        Intake/Output Summary (Last 24 hours) at 05/14/2018 1034 Last data filed at 05/14/2018 0900 Gross per 24 hour  Intake 243 ml  Output 400 ml  Net -157 ml   Filed Weights   05/11/18 0224 05/12/18 0139 05/13/18 0336  Weight: 134.2 kg 133.3 kg 134.2 kg   Examination: Physical Exam:   Constitutional: Nourished, well-developed morbidly obese Caucasian female currently no acute distress appears calm and comfortable Eyes: Lids and conjunctive are normal.  Sclera anicteric ENMT: External ears nose appear normal.  Grossly normal hearing.  Mucous members are moist Neck: Appears supple no JVD Respiratory: Diminished to auscultation bilaterally no appreciable wheezing, rales, rhonchi.  Patient not tachypneic or using accessory muscles to breathe but was wearing supplemental oxygen via nasal cannula and is on 3 Liters  Cardiovascular: Regular rate and rhythm.  No appreciable murmurs, rubs or gallops.  Has 1+ lower extremity edema noted Abdomen: Soft, nontender, distended secondary body habitus.  Bowel sounds present GU: Deferred; Has a Purewick in place Musculoskeletal: No clubbing or cyanosis.  No joint deformities upper extremities Skin: No appreciable rashes or lesions on physical evaluation. Neurologic: Cranial nerves II through XII grossly intact no appreciable focal deficits.  Romberg sign cerebellar reflexes were not assessed Psychiatric: Normal judgment and insight.  Patient is awake, alert and oriented x3.  Pleasant mood and affect  Data Reviewed: I have personally reviewed following labs and imaging studies  CBC: Recent Labs  Lab 05/12/18 0421 05/13/18 0923 05/14/18 0337  WBC 7.4 9.7 8.6  NEUTROABS  --  8.0* 6.9  HGB 8.9* 9.8* 9.4*  HCT 30.8* 32.6* 30.7*  MCV 87.0 86.7 86.2  PLT 245 230 944   Basic Metabolic Panel: Recent Labs  Lab 05/10/18 0439 05/10/18 1451 05/11/18 0433 05/12/18 0421 05/13/18 0518 05/14/18 0337  NA 137  --  134* 137 137 138  K 3.2* 3.7 3.8 3.9 4.0 3.7  CL 91*  --  90* 92* 93* 94*  CO2 34*  --  32 _0 GLUCOSE 105*  --  104* 86 114* 113*  BUN 14  --  18 26* 37* 36*  CREATININE 1.21*  --  1.25* 1.54* 1.71* 1.64*  CALCIUM 8.5*  --  8.6* 8.6* 8.2* 8.0*  MG  --  2.0  --   --   --  2.1  PHOS  --   --   --   --   --  4.0   GFR:  Estimated Creatinine Clearance: 48.3 mL/min (A) (by C-G formula based on SCr of 1.64 mg/dL (H)). Liver Function Tests: Recent Labs  Lab 05/14/18 0337  AST 1,049*  ALT 654*  ALKPHOS 185*  BILITOT 1.6*  PROT 6.1*  ALBUMIN 2.7*   No results for input(s): LIPASE, AMYLASE in the last 168 hours. No results for input(s): AMMONIA in the last 168 hours. Coagulation Profile: No results for input(s): INR, PROTIME in the last 168 hours. Cardiac Enzymes: No results for input(s): CKTOTAL, CKMB, CKMBINDEX, TROPONINI in the last 168 hours. BNP (last 3 results) No results for input(s): PROBNP in the last 8760 hours. HbA1C: No results for input(s): HGBA1C in the last 72 hours. CBG: Recent Labs  Lab 05/10/18 0621  GLUCAP 101*   Lipid Profile: No results for input(s): CHOL, HDL, LDLCALC, TRIG, CHOLHDL, LDLDIRECT in the last 72 hours. Thyroid Function Tests: No results for input(s): TSH, T4TOTAL, FREET4, T3FREE, THYROIDAB in the last 72 hours. Anemia Panel: No results for input(s): VITAMINB12, FOLATE, FERRITIN, TIBC, IRON, RETICCTPCT in the last 72 hours. Sepsis Labs: No results for input(s): PROCALCITON, LATICACIDVEN in the last 168 hours.  Recent Results (from the past 240 hour(s))  MRSA PCR Screening     Status: None   Collection Time: 05/06/18 10:29 PM  Result Value Ref Range Status   MRSA by PCR NEGATIVE NEGATIVE Final    Comment:        The GeneXpert MRSA Assay (FDA approved for NASAL specimens only), is one component of a comprehensive MRSA colonization surveillance program. It is not intended to diagnose MRSA infection nor to guide or monitor treatment for MRSA infections. Performed at [facility], [place] [address] [place],  [address]   Culture, Urine     Status: Abnormal   Collection Time: 05/11/18 10:03 AM  Result Value Ref Range Status   Specimen Description URINE, RANDOM  Final   Special Requests   Final    NONE Performed at [facility],  [place] [address], [place], [place] [address]    Culture >=100,000 COLONIES/mL ESCHERICHIA COLI (A)  Final   Report Status 05/13/2018 FINAL  Final   Organism ID, Bacteria ESCHERICHIA COLI (A)  Final      Susceptibility   Escherichia coli - MIC*    AMPICILLIN 16 INTERMEDIATE Intermediate     CEFAZOLIN <=4 SENSITIVE Sensitive     CEFTRIAXONE <=1 SENSITIVE Sensitive     CIPROFLOXACIN <=0.25 SENSITIVE Sensitive     GENTAMICIN <=1 SENSITIVE Sensitive     IMIPENEM <=0.25 SENSITIVE Sensitive     NITROFURANTOIN <=16 SENSITIVE Sensitive     TRIMETH/SULFA <=20 SENSITIVE Sensitive     AMPICILLIN/SULBACTAM 8 SENSITIVE Sensitive     PIP/TAZO 8 SENSITIVE Sensitive     Extended ESBL NEGATIVE Sensitive     * >=100,000 COLONIES/mL ESCHERICHIA COLI      Radiology Studies: Dg Chest Port 1 View  Result Date: 05/14/2018 CLINICAL DATA:  Short of breath EXAM: PORTABLE CHEST 1 VIEW COMPARISON:  05/06/2018  FINDINGS: Cardiac enlargement with mild progression of vascular congestion. Progression of bibasilar atelectasis and small effusions. Atherosclerotic ascending aorta and aortic arch. IMPRESSION: Congestive heart failure with mild progression. Electronically Signed   By: Franchot Gallo M.D.   On: 05/14/2018 08:31    Scheduled Meds: . buPROPion  150 mg Oral Daily  . clopidogrel  75 mg Oral Daily  . furosemide  40 mg Intravenous BID  . heparin injection (subcutaneous)  5,000 Units Subcutaneous Q8H  . iron polysaccharides  150 mg Oral Daily  . mouth rinse  15 mL Mouth Rinse BID  . metoprolol succinate  12.5 mg Oral Daily  . mirabegron ER  25 mg Oral Daily  . multivitamin with minerals  1 tablet Oral Daily  . pantoprazole  40 mg Oral Daily  . potassium chloride  20 mEq Oral BID  . senna-docusate  1 tablet Oral BID  . sodium chloride flush  3 mL Intravenous Q12H  . venlafaxine  112.5 mg Oral Q breakfast   And  . venlafaxine  37.5 mg Oral QAC supper   Continuous Infusions: . sodium chloride 250 mL (05/12/18  1322)  . cefTRIAXone (ROCEPHIN)  IV 1 g (05/13/18 1035)    LOS: 8 days   Kerney Elbe, DO [facility] PAGER is on [place]  If 7PM-7AM, please contact night-coverage [email or web address] Password [facility] 05/14/2018, 10:34 AM

## 2018-05-14 NOTE — Progress Notes (Signed)
Physical Therapy Treatment Patient Details Name: Shannon Carroll MRN: [id number] DOB: [birth year] Today's Date: 05/14/2018    History of Present Illness 68 year old female with a history of hypertension, hyperlipidemia, diet-controlled diabetes mellitus, stroke, depression, CKD stage III, diastolic CHF, aortic valve stenosis came with shortness of breath and bilateral lower extremity edema.  Patient admitted with acute on chronic diastolic CHF.    PT Comments    Pt admitted with above diagnosis. Pt currently with functional limitations due to the deficits listed below (see PT Problem List). Pt was able to stand with [name] with +2 for safety with pt able to stand without physical assist from staff.  Stood in [place] plus for 4 min and was able to get onto 3N1 and then to recliner.  Pt progressing.  Still confused and it limits her ability to follow commands consistently.  Pt will benefit from skilled PT to increase their independence and safety with mobility to allow discharge to the venue listed below.     Follow Up Recommendations  SNF;Supervision for mobility/OOB     Equipment Recommendations  None recommended by PT    Recommendations for Other Services       Precautions / Restrictions Precautions Precautions: Fall Restrictions Weight Bearing Restrictions: No    Mobility  Bed Mobility Overal bed mobility: Needs Assistance Bed Mobility: Rolling;Sit to Supine Rolling: Mod assist;Max assist   Supine to sit: Mod assist;+2 for safety/equipment;HOB elevated;Max assist     General bed mobility comments: assist to bend each leg up to roll; used bedrails and therapist assisted with chux pad.   Transfers Overall transfer level: Needs assistance   Transfers: Sit to/from Stand Sit to Stand: Mod assist;From elevated surface;+2 safety/equipment         General transfer comment: Obtained [name] which helped pt with sitting EOB as she could hold on and was strapped in.  [name]  lifted pt with pt assisting somewhat. Was able to achieve full stand.  Stood for up to 4 min and moved pt to 3N1 where pt had BM and then stood again and moved pt to recliner.   Ambulation/Gait                 Stairs             Wheelchair Mobility    Modified Rankin (Stroke Patients Only) Modified Rankin (Stroke Patients Only) Pre-Morbid Rankin Score: Moderately severe disability Modified Rankin: Severe disability     Balance Overall balance assessment: Needs assistance Sitting-balance support: Bilateral upper extremity supported;Feet supported Sitting balance-Leahy Scale: Poor Sitting balance - Comments: significant posterior lean, unable to maintain sitting balance without UE support or external assist. Postural control: Posterior lean Standing balance support: Bilateral upper extremity supported Standing balance-Leahy Scale: Poor Standing balance comment: requires physical assist and use of [name]                            Cognition Arousal/Alertness: Awake/alert Behavior During Therapy: Flat affect Overall Cognitive Status: No family/caregiver present to determine baseline cognitive functioning                                 General Comments: pt had difficulty following motor commands; tactile cues needed to bend leg up to roll and perform UE exercises      Exercises      General Comments  Pertinent Vitals/Pain Pain Assessment: No/denies pain    Home Living                      Prior Function            PT Goals (current goals can now be found in the care plan section) Acute Rehab PT Goals Patient Stated Goal: to get stronger Progress towards PT goals: Progressing toward goals    Frequency    Min 2X/week      PT Plan Current plan remains appropriate    Co-evaluation              AM-PAC PT "6 Clicks" Mobility   Outcome Measure  Help needed turning from your back to your side  while in a flat bed without using bedrails?: A Lot Help needed moving from lying on your back to sitting on the side of a flat bed without using bedrails?: A Lot Help needed moving to and from a bed to a chair (including a wheelchair)?: Total Help needed standing up from a chair using your arms (e.g., wheelchair or bedside chair)?: Total Help needed to walk in hospital room?: Total Help needed climbing 3-5 steps with a railing? : Total 6 Click Score: 8    End of Session Equipment Utilized During Treatment: Oxygen;Gait belt Activity Tolerance: Patient limited by fatigue Patient left: with call bell/phone within reach;in chair Nurse Communication: Mobility status;Need for lift equipment(nurse aware to use [name] to get pt back to bed) PT Visit Diagnosis: Muscle weakness (generalized) (M62.81);Difficulty in walking, not elsewhere classified (R26.2)     Time: 6945-0388 PT Time Calculation (min) (ACUTE ONLY): 35 min  Charges:  $Gait Training: 8-22 mins $Therapeutic Activity: 8-22 mins                     [place] Pager:  [phone]  Office:  [place] 05/14/2018, 11:39 AM

## 2018-05-14 NOTE — Progress Notes (Signed)
Patient c/o chocking after eating and drinking pt is  coughing, wheezing. MD notified chest xtray done per MD order. See manage order. Will continue to monitor pt.

## 2018-05-14 NOTE — Progress Notes (Signed)
VAST RN returned to bedside for PIV start. Pt eating lunch. To return after 30 mins for assessment of arms.

## 2018-05-14 NOTE — Progress Notes (Signed)
VAST RN consulted to place PIV. Spoke with unit RN who stated pt was complaining of pain at IV site with abx administration.  Pt reclining in recliner and unable to get up to bed without assistance with PT equipment.  Unit RN to call VAST RN when pt back in bed.

## 2018-05-14 NOTE — Progress Notes (Signed)
Progress Note  Patient Name: Shannon Carroll Date of Encounter: 05/14/2018  Primary Cardiologist: Minus Breeding, MD   Subjective   Denies dyspnea or CP; no abdominal pain or nausea  Inpatient Medications    Scheduled Meds: . atorvastatin  40 mg Oral Daily  . buPROPion  150 mg Oral Daily  . clopidogrel  75 mg Oral Daily  . furosemide  40 mg Intravenous BID  . heparin injection (subcutaneous)  5,000 Units Subcutaneous Q8H  . iron polysaccharides  150 mg Oral Daily  . mouth rinse  15 mL Mouth Rinse BID  . metoprolol succinate  12.5 mg Oral Daily  . mirabegron ER  25 mg Oral Daily  . multivitamin with minerals  1 tablet Oral Daily  . pantoprazole  40 mg Oral Daily  . potassium chloride  20 mEq Oral BID  . senna-docusate  1 tablet Oral BID  . sodium chloride flush  3 mL Intravenous Q12H  . venlafaxine  112.5 mg Oral Q breakfast   And  . venlafaxine  37.5 mg Oral QAC supper   Continuous Infusions: . sodium chloride 250 mL (05/12/18 1322)  . cefTRIAXone (ROCEPHIN)  IV 1 g (05/13/18 1035)   PRN Meds: sodium chloride, acetaminophen, clonazePAM, dextromethorphan-guaiFENesin, hydrALAZINE, hydrOXYzine, levalbuterol, sodium chloride flush   Vital Signs    Vitals:   05/13/18 1311 05/13/18 1843 05/13/18 1910 05/14/18 0448  BP: (!) 115/57 (!) 94/47 (!) 105/57 (!) 110/55  Pulse: 63 62 67 68  Resp: 19  18 18   Temp: (!) 97.4 F (36.3 C) (!) 97.5 F (36.4 C) 97.7 F (36.5 C) 97.7 F (36.5 C)  TempSrc: Oral Oral Oral Oral  SpO2: 100% 100% 97% 97%  Weight:      Height:        Intake/Output Summary (Last 24 hours) at 05/14/2018 0838 Last data filed at 05/14/2018 0756 Gross per 24 hour  Intake 123 ml  Output 400 ml  Net -277 ml   Last 3 Weights 05/13/2018 05/12/2018 05/11/2018  Weight (lbs) 295 lb 13.7 oz 293 lb 14 oz 295 lb 13.7 oz  Weight (kg) 134.2 kg 133.3 kg 134.2 kg      Telemetry    Sinus- Personally Reviewed  Physical Exam   GEN: Obese, NAD Neck: supple  Cardiac: RRR, 2/6 systolic murmur; no DM Respiratory: CTA; no wheeze GI: Soft, no RUQ tenderness MS: 1+ thigh edema; lower ext wrapped Neuro:  Grossly intact  Labs    Chemistry Recent Labs  Lab 05/12/18 0421 05/13/18 0518 05/14/18 0337  NA 137 137 138  K 3.9 4.0 3.7  CL 92* 93* 94*  CO2 29 28 30   GLUCOSE 86 114* 113*  BUN 26* 37* 36*  CREATININE 1.54* 1.71* 1.64*  CALCIUM 8.6* 8.2* 8.0*  PROT  --   --  6.1*  ALBUMIN  --   --  2.7*  AST  --   --  1,049*  ALT  --   --  654*  ALKPHOS  --   --  185*  BILITOT  --   --  1.6*  GFRNONAA 35* 30* 32*  GFRAA 40* 35* 37*  ANIONGAP 16* 16* 14     Hematology Recent Labs  Lab 05/12/18 0421 05/13/18 0923 05/14/18 0337  WBC 7.4 9.7 8.6  RBC 3.54* 3.76* 3.56*  HGB 8.9* 9.8* 9.4*  HCT 30.8* 32.6* 30.7*  MCV 87.0 86.7 86.2  MCH 25.1* 26.1 26.4  MCHC 28.9* 30.1 30.6  RDW 17.5* 17.8* 17.9*  PLT 245 230 202    Patient Profile     68 y.o. female with past medical history of mild aortic stenosis/moderate mitral stenosis, pulmonary hypertension, hypertension, diabetes mellitus, hyperlipidemia, prior stroke, chronic stage III kidney disease, obstructive sleep apnea admitted with acute on chronic diastolic congestive heart failure.  Also noted to have SVT on telemetry.  Assessment & Plan    1 acute on chronic diastolic congestive heart failure-I/O -397.  She is mildly volume overloaded on exam.  Renal function unchanged compared to yesterday.  We will continue present dose of Lasix and transition to oral tomorrow.  Follow renal function closely.  A large component of this is likely right heart failure related to pulmonary hypertension from obesity hypoventilation syndrome, sleep apnea and pulmonary venous hypertension.     2 pulmonary hypertension-likely combination of pulmonary venous hypertension related to valvular heart disease as well as obstructive sleep apnea and obesity hypoventilation syndrome.  3 supraventricular  tachycardia-no episodes.  Continue low-dose Toprol and follow.  4 history of moderate mitral stenosis and mild aortic stenosis-we will need follow-up echoes in the future.  5 chronic stage III kidney disease-BUN creatinine unchanged compared to yesterday.  Continue present dose of Lasix and follow.  6 obstructive sleep apnea-needs sleep evaluation following discharge.  7 morbid obesity-needs significant weight loss.  8 elevated liver functions-etiology unclear.  Agree with right upper quadrant ultrasound and hepatitis serologies.  Passive congestion seems unlikely as her LFTs were normal on March 18 and she has been diuresed since that time.  Discontinue Lipitor and as needed Tylenol for now.  Follow.  For questions or updates, please contact [place] Please consult [email or web address] for contact info under        Signed, Kirk Ruths, MD  05/14/2018, 8:38 AM

## 2018-05-15 ENCOUNTER — Inpatient Hospital Stay (HOSPITAL_COMMUNITY): Payer: Medicare Other

## 2018-05-15 DIAGNOSIS — T17908D Unspecified foreign body in respiratory tract, part unspecified causing other injury, subsequent encounter: Secondary | ICD-10-CM

## 2018-05-15 LAB — COMPREHENSIVE METABOLIC PANEL
ALT: 506 U/L — ABNORMAL HIGH (ref 0–44)
AST: 731 U/L — ABNORMAL HIGH (ref 15–41)
Albumin: 2.8 g/dL — ABNORMAL LOW (ref 3.5–5.0)
Alkaline Phosphatase: 162 U/L — ABNORMAL HIGH (ref 38–126)
Anion gap: 15 (ref 5–15)
BILIRUBIN TOTAL: 1.3 mg/dL — AB (ref 0.3–1.2)
BUN: 28 mg/dL — ABNORMAL HIGH (ref 8–23)
CALCIUM: 8.4 mg/dL — AB (ref 8.9–10.3)
CO2: 33 mmol/L — ABNORMAL HIGH (ref 22–32)
Chloride: 94 mmol/L — ABNORMAL LOW (ref 98–111)
Creatinine, Ser: 1.51 mg/dL — ABNORMAL HIGH (ref 0.44–1.00)
GFR calc Af Amer: 41 mL/min — ABNORMAL LOW (ref >60)
GFR calc non Af Amer: 35 mL/min — ABNORMAL LOW (ref >60)
Glucose, Bld: 101 mg/dL — ABNORMAL HIGH (ref 70–99)
Potassium: 3.1 mmol/L — ABNORMAL LOW (ref 3.5–5.1)
Sodium: 142 mmol/L (ref 135–145)
Total Protein: 6.5 g/dL (ref 6.5–8.1)

## 2018-05-15 LAB — CBC WITH DIFFERENTIAL/PLATELET
Abs Immature Granulocytes: 0.06 10*3/uL (ref 0.00–0.07)
Basophils Absolute: 0 10*3/uL (ref 0.0–0.1)
Basophils Relative: 0 %
Eosinophils Absolute: 0.3 10*3/uL (ref 0.0–0.5)
Eosinophils Relative: 4 %
HCT: 30.9 % — ABNORMAL LOW (ref 36.0–46.0)
Hemoglobin: 9.3 g/dL — ABNORMAL LOW (ref 12.0–15.0)
Immature Granulocytes: 1 %
Lymphocytes Relative: 8 %
Lymphs Abs: 0.6 10*3/uL — ABNORMAL LOW (ref 0.7–4.0)
MCH: 26.5 pg (ref 26.0–34.0)
MCHC: 30.1 g/dL (ref 30.0–36.0)
MCV: 88 fL (ref 80.0–100.0)
Monocytes Absolute: 0.5 10*3/uL (ref 0.1–1.0)
Monocytes Relative: 7 %
Neutro Abs: 5.9 10*3/uL (ref 1.7–7.7)
Neutrophils Relative %: 80 %
Platelets: 183 10*3/uL (ref 150–400)
RBC: 3.51 MIL/uL — ABNORMAL LOW (ref 3.87–5.11)
RDW: 17.9 % — ABNORMAL HIGH (ref 11.5–15.5)
WBC: 7.3 10*3/uL (ref 4.0–10.5)
nRBC: 0 % (ref 0.0–0.2)

## 2018-05-15 LAB — PHOSPHORUS: Phosphorus: 3.4 mg/dL (ref 2.5–4.6)

## 2018-05-15 LAB — HEPATITIS PANEL, ACUTE
HCV AB: 0.1 {s_co_ratio} (ref 0.0–0.9)
Hep A IgM: NEGATIVE
Hep B C IgM: NEGATIVE
Hepatitis B Surface Ag: NEGATIVE

## 2018-05-15 LAB — MAGNESIUM: Magnesium: 2.1 mg/dL (ref 1.7–2.4)

## 2018-05-15 MED ORDER — LEVALBUTEROL HCL 0.63 MG/3ML IN NEBU
0.6300 mg | INHALATION_SOLUTION | Freq: Two times a day (BID) | RESPIRATORY_TRACT | Status: DC
  Administered 2018-05-15 – 2018-05-19 (×8): 0.63 mg via RESPIRATORY_TRACT
  Filled 2018-05-15 (×8): qty 3

## 2018-05-15 MED ORDER — PRO-STAT SUGAR FREE PO LIQD
30.0000 mL | Freq: Two times a day (BID) | ORAL | Status: DC
  Administered 2018-05-15 – 2018-05-19 (×6): 30 mL via ORAL
  Filled 2018-05-15 (×8): qty 30

## 2018-05-15 MED ORDER — IPRATROPIUM BROMIDE 0.02 % IN SOLN
0.5000 mg | Freq: Three times a day (TID) | RESPIRATORY_TRACT | Status: DC
  Administered 2018-05-15: 0.5 mg via RESPIRATORY_TRACT
  Filled 2018-05-15: qty 2.5

## 2018-05-15 MED ORDER — POTASSIUM CHLORIDE CRYS ER 20 MEQ PO TBCR
40.0000 meq | EXTENDED_RELEASE_TABLET | Freq: Two times a day (BID) | ORAL | Status: DC
  Administered 2018-05-15: 40 meq via ORAL
  Filled 2018-05-15: qty 4
  Filled 2018-05-15: qty 2

## 2018-05-15 MED ORDER — FUROSEMIDE 40 MG PO TABS
40.0000 mg | ORAL_TABLET | Freq: Two times a day (BID) | ORAL | Status: DC
  Filled 2018-05-15 (×2): qty 1

## 2018-05-15 MED ORDER — IPRATROPIUM BROMIDE 0.02 % IN SOLN
0.5000 mg | Freq: Two times a day (BID) | RESPIRATORY_TRACT | Status: DC
  Administered 2018-05-16 – 2018-05-19 (×6): 0.5 mg via RESPIRATORY_TRACT
  Filled 2018-05-15 (×6): qty 2.5

## 2018-05-15 MED ORDER — LEVALBUTEROL HCL 0.63 MG/3ML IN NEBU
0.6300 mg | INHALATION_SOLUTION | Freq: Three times a day (TID) | RESPIRATORY_TRACT | Status: DC
  Administered 2018-05-15: 0.63 mg via RESPIRATORY_TRACT
  Filled 2018-05-15: qty 3

## 2018-05-15 MED ORDER — RESOURCE THICKENUP CLEAR PO POWD
ORAL | Status: DC | PRN
  Filled 2018-05-15: qty 125

## 2018-05-15 NOTE — Evaluation (Signed)
Clinical/Bedside Swallow Evaluation Patient Details  Name: Shannon Carroll MRN: [id number] Date of Birth: 1950/02/28  Today's Date: 05/15/2018 Time: SLP Start Time (ACUTE ONLY): 37 SLP Stop Time (ACUTE ONLY): 0942 SLP Time Calculation (min) (ACUTE ONLY): 22 min  Past Medical History:  Past Medical History:  Diagnosis Date  . Adenomatous polyps 12/14   f/u 5 yrs dr Penelope Coop  . Arthritis   . Chronic kidney disease    stage 2  . Depression   . Diabetes mellitus without complication ([place])   . Hirsutism   . Hyperlipidemia   . Hypertension   . Murmur   . Obesity   . Situational stress   . Stroke [facility])    Past Surgical History:  Past Surgical History:  Procedure Laterality Date  . CESAREAN SECTION  12/23/1980  . COLONOSCOPY  06/2005-09/2007  . DILATION AND CURETTAGE OF UTERUS    . RIGHT/LEFT HEART CATH AND CORONARY ANGIOGRAPHY N/A 02/04/2018   Procedure: RIGHT/LEFT HEART CATH AND CORONARY ANGIOGRAPHY;  Surgeon: Nelva Bush, MD;  Location: [facility];  Service: Cardiovascular;  Laterality: N/A;  . TONSILLECTOMY AND ADENOIDECTOMY  19524   HPI:  68 year old female with a history of hypertension, hyperlipidemia, diet-controlled diabetes mellitus, stroke, depression, CKD stage III, diastolic CHF, aortic valve stenosis came with shortness of breath and bilateral lower extremity edema.  Patient admitted with acute on chronic diastolic CHF. On 3/26 she was noted to be coughing with PO intake, therefore SLP swallow evaluation was ordered.   Assessment / Plan / Recommendation Clinical Impression  Pt has immediate coughing and throat clearing that consistently followed sips of thin liquids. This is not observed with nectar thick liquids or purees, although she did have a delayed cough that was noted several minutes after PO intake had stopped and after the [facility] had been minimally reclined. Question if she could have an esophageal component, although she denies any prior h/o esophageal  issues and/or GER. Instrumental testing may not be an option at this time given that endoscopies are on hold at this time and pt's body habitus may not allow for MBS. Recommend starting regular diet but with nectar thick liquids, using strict aspiration and esophageal precautions. Of note, pt had decreased recall of information and reduced safety awareness throughout assessment. She may benefit from full supervision initially for safety and to monitor for tolerance. SLP will f/u for tolerance and potential to advance.  SLP Visit Diagnosis: Dysphagia, unspecified (R13.10)    Aspiration Risk  Mild aspiration risk;Moderate aspiration risk    Diet Recommendation Regular;Nectar-thick liquid   Liquid Administration via: Cup;Straw Medication Administration: Whole meds with puree Supervision: Patient able to self feed;Full supervision/cueing for compensatory strategies Compensations: Slow rate;Small sips/bites Postural Changes: Seated upright at 90 degrees;Remain upright for at least 30 minutes after po intake    Other  Recommendations Oral Care Recommendations: Oral care BID Other Recommendations: Order thickener from pharmacy;Prohibited food (jello, ice cream, thin soups);Remove water pitcher   Follow up Recommendations Skilled Nursing facility      Frequency and Duration min 2x/week  2 weeks       Prognosis Prognosis for Safe Diet Advancement: Good Barriers to Reach Goals: Cognitive deficits      Swallow Study   General HPI: 68 year old female with a history of hypertension, hyperlipidemia, diet-controlled diabetes mellitus, stroke, depression, CKD stage III, diastolic CHF, aortic valve stenosis came with shortness of breath and bilateral lower extremity edema.  Patient admitted with acute on chronic diastolic CHF. On  3/26 she was noted to be coughing with PO intake, therefore SLP swallow evaluation was ordered. Type of Study: Bedside Swallow Evaluation Previous Swallow Assessment: none  in chart Diet Prior to this Study: NPO Temperature Spikes Noted: No Respiratory Status: Nasal cannula History of Recent Intubation: No Behavior/Cognition: Alert;Cooperative;Requires cueing Oral Cavity Assessment: Within Functional Limits Oral Care Completed by SLP: No Oral Cavity - Dentition: Adequate natural dentition Vision: Functional for self-feeding Self-Feeding Abilities: Able to feed self Patient Positioning: Upright in bed(using reverse trend) Baseline Vocal Quality: Normal Volitional Cough: Strong;Other (Comment)(needs cues - otherwise says, "I don't have to") Volitional Swallow: Able to elicit    Oral/Motor/Sensory Function Overall Oral Motor/Sensory Function: Within functional limits   Ice Chips Ice chips: Not tested   Thin Liquid Thin Liquid: Impaired Presentation: Cup;Self Fed;Straw Pharyngeal  Phase Impairments: Throat Clearing - Immediate;Cough - Immediate;Cough - Delayed    Nectar Thick Nectar Thick Liquid: Impaired Presentation: Self Fed;Straw Pharyngeal Phase Impairments: Cough - Delayed   Honey Thick Honey Thick Liquid: Not tested   Puree Puree: Impaired Presentation: Self Fed;Spoon Pharyngeal Phase Impairments: Cough - Delayed   Solid     Solid: Impaired Presentation: Self Fed Pharyngeal Phase Impairments: Cough - Delayed      Venita Sheffield Verl Kitson 05/15/2018,10:00 AM  [name], M.A. [place] Acute Environmental education officer [phone] Office [phone]

## 2018-05-15 NOTE — Progress Notes (Signed)
Patient c/o shortness of breath oxygen saturation 100%/3L, respiratory treatment given, schedule dose morphine and lasix given, patient feel a little better MD notified and rapid response called to assess the patient. MD instruct to continue to monitor the patient.

## 2018-05-15 NOTE — Progress Notes (Addendum)
Initial Nutrition Assessment  RD working remotely.  DOCUMENTATION CODES:   Morbid obesity  INTERVENTION:   -Continue MVI with minerals daily -30 ml Prostat BID, each supplement provides 100 kcals and 15 grams protein -RD will follow for diet advancement  NUTRITION DIAGNOSIS:   Increased nutrient needs related to wound healing as evidenced by estimated needs.  GOAL:   Patient will meet greater than or equal to 90% of their needs  MONITOR:   PO intake, Supplement acceptance, Diet advancement, Labs, Weight trends, Skin, I & O's  REASON FOR ASSESSMENT:   LOS    ASSESSMENT:   Shannon Carroll is a 68 y.o. female with medical history significant of hypertension, hyperlipidemia, diet-controlled diabetes, stroke, depression, anxiety, CKD-3, dCHF, OSA not on CPAP, mitral valve stenosis, aortic valve stenosis, who presents with shortness of breath and bilateral leg edema.  Pt admitted with acute on chronic CHF.   3/19- s/p CWOCN consult- full thickness wound on lt lateral LE with green drainage 3/26- RN observed pt choking with food and liquids; NPO 3/27- s/p BSE- SLP recommending regular consistency diet with nectar thickened liquids  Reviewed I/O's: -185 ml x 24 hours and -9.6 L since admission  UOP: 525 ml x 24 hours  Attempted to speak with pt, however, pt did did not answer phone.   Pt with good appetite; noted meal completion 50-100%. Per RN notes, pt does not like the thickened liquids and refused this afternoon's administration of medications due to this.   Reviewed wt hx; noted wt has experienced a 2.6% wt loss over the past 1 year, which is not significant for time frame.   Per MD notes, plan to d/c back to SNF once medically ready.   Labs reviewed: K: 3.1.   Diet Order:   Diet Order            Diet heart healthy/carb modified Room service appropriate? Yes with Assist; Fluid consistency: Nectar Thick; Fluid restriction: 1500 mL Fluid  Diet effective now              EDUCATION NEEDS:   No education needs have been identified at this time  Skin:  Skin Assessment: Skin Integrity Issues: Skin Integrity Issues:: Other (Comment) Other: MASD to breast and groin, full thickness wound to lt lateral LE  Last BM:  05/13/18  Height:   Ht Readings from Last 1 Encounters:  05/06/18 5\' 8"  (1.727 m)    Weight:   Wt Readings from Last 1 Encounters:  05/15/18 133.1 kg    Ideal Body Weight:  63.6 kg  BMI:  Body mass index is 44.62 kg/m.  Estimated Nutritional Needs:   Kcal:  1900-2100  Protein:  95-110 grams  Fluid:  1.9-2.1 L    Veatrice Eckstein A. Jimmye Norman, RD, LDN, [place] Registered Dietitian II Certified Diabetes Care and Education Specialist Pager: [phone] After hours Pager: [phone]

## 2018-05-15 NOTE — Progress Notes (Signed)
PROGRESS NOTE    Shannon Carroll  XMI:[id number] DOB: 1950-12-19 DOA: 05/06/2018 PCP: Harlan Stains, MD   Brief Narrative:  The patient is a 68 year old female with a history of hypertension, hyperlipidemia, diet-controlled diabetes mellitus, stroke, depression, CKD stage III, diastolic CHF, aortic valve stenosis and other comorbidities who presented with shortness of breath and bilateral lower extremity edema.  Patient admitted with acute on chronic diastolic CHF. She has been started on Diuresis and has improved significantly but Renal Function started worsening so Cardiology decreased Diuresis yesterday and Cr slightly improved. Hospitalization has also been complicated by Abnormal LFT's so workup has been initiated with RUQ U/S and Acute Hepatitis Panel which has been unremarkable. Also has been complicated by ?Aspiratio and Dysphagia and SLP recommending Regular Diet with Nectar Thick Liquids.    Assessment & Plan:   Principal Problem:   Acute on chronic diastolic CHF (congestive heart failure) (HCC) Active Problems:   Hyperlipidemia   Depression   Hypertension   Stroke (HCC)   CKD (chronic kidney disease), stage III (HCC)   Normocytic anemia   Hypokalemia   Acute on chronic diastolic (congestive) heart failure (HCC)   Abnormal LFTs   Acute on chronic congestive heart failure (HCC)   SOB (shortness of breath)   Hyperbilirubinemia  Acute Respiratory Failure with Hypoxia 2/2 to Diastolic CHF -See As below -Continuous Pulse Oximetry and Maintain O2 Saturations >90% -Continue Supplemental O2 via [place] and Wean O2 as tolerated -C/w Diuresis as below -Will need Home Ambulatory Screen Prior to D/C -C/w Dextromethorphan-Guaifenesin 1 tab po BIDprn Cough -Added Xopenex/Atrovent q6h and reduced to TID today  -Repeat CXR in AM   Acute on Chronic Diastolic CHF, improving  -Improving, patient presented with dyspnea, leg edema, BNP 1137 on admission and repeat was 1,077. -There was Vascular  congestion on chest x-ray.   -2D echo in December 2019 showed grade 2 diastolic dysfunction.  -Strict I's/O's, Daily Weights, and Fluid Restriction to 1500 mL -Cardiology feels that patient has right heart failure and had restarted Lasix 80 mg every 12 hours along with Demadex but now Demadex has been discontinued and Lasix has been reduced to IV 40 mg BID given worsening Renal Function and will be changed to po 40 mg BID per Cardiology  -Patient is -22 lbs from admission and is also -9.604 Liters -Continue to Monitor Volume Status Carefully  -Repeat CXR this AM showed "There is persistent cardiomegaly. Pulmonary vascularity is within normal limits. Currently there is no appreciable edema or consolidation. No adenopathy. No bone lesions. There is aortic atherosclerosis." -Repeat CXR in AM   Hyperlipidemia -Will Discontinue Atorvastatin 40 mg po daily given Abnormal LFTs -Resume in the outpatient setting   Hypertension -Blood pressure is stable and is 105/65   -Not on Amlodipine while currently hospitalized -C/w IV Diuresis per Cardiology as above -C/w Metoprolol Succinate 12.5 mg po Daily  -C/w IV Hydralazine 5 mg IV q2hprn for SBP>175  Depression/Anxiety -Continue Bupropion 150 mg po Daily and Clonazepam 0.5 mg po BIDprn Anxiety -Also will continue with Hydroxyzine 10 mg po Daily PRN -C/w Venlafaxine 112.5 mg po Daily with Breakfast and 37.5 mg po daily with Supper  History of CVA -C/w Clopidogrel 75 mg po Daily and holding Atorvastatin 40 mg po Daily   AKI on CKD stage III -Creatinine normally at baseline 1.0-1.3.  Creatinine had worsened and was 1.71 and Secondary to diuresis.  -Cardiology had reduced IV Diuresis to 40 mg BID from 80 mg BID and have  stopped Demadex as well; Now Cardiology changing Diuresis to po Furosemide 40 mg po Daily  -BUN/Cr slightly improved to 28/1.51 -Continue to Monitor and Trend Renal Function -Repeat CMP in AM   Hypokalemia -Improved. K+ is now  3.1 -Continue to Monitor and Replete with po KCl 20 mEQ BID while being diuresed  -Given additional 40 mEQ po KCl BID x2 today  -Repeat CMP in AM   Normocytic Anemia -Patient's Hgb/Hct went from 8.9/30.8 -> 9.8/32.6 -> 9.4/30.7 -> 9.3/30.9 -Anemia panel on admission showed an iron level of 21, U IBC of 200, TIBC of 221, saturation ratios of 9%, ferritin level 77, folate level 21.7, and vitamin B12 of 686 -Continue with Niferex 150 mg po Daily  -Continue to monitor for signs and symptoms of bleeding -Repeat CMP in a.m.  ?UTI/Asymptomatic Bacteriuria -Initial U/A showed Clear color appearance, strong color urine, positive nitrites, few bacteria, and 0-5 WBCs -This was repeated on 323 -Patient had abnormal UA and, with no Dysuria currently but ? If she did have dysuria previously -Urine culture has been ordered and showed >100,000 CFU of E Coli that was only resistant to Ampicillin  -She was started IV Rocephin 1 g IV daily and will treat for at least 3 Days given that it is unclear if she had symptoms or not but will go to 5 days given intermittent confusion reported by Nursing staff  SVT -Patient had episode of SVT 2 days ago, currently she is in normal sinus rhythm.  No more episodes of SVT.   -Continue Metoprolol Succinate 12.5 mg po Daily and IV Lopressor has been stopped  -C/w Telemetry  Morbid Obesity -Estimated body mass index is 44.62 kg/m as calculated from the following:   Height as of this encounter: 5' 8"  (1.727 m).   Weight as of this encounter: 133.1 kg. -Weight Loss and Dietary Counseling Given   Mitral Valve and Aortic Valve Stenosis -Per Cardiology will need Follow up ECHOes in the Future  Pulmonary HTN -Likely related to Valvular Heart Disease as well as OSA/OHS -Diuresis per Cardiology as above; Now on po   Suspected OSA -Needs outpatient Sleep Studying   Increased AG; Now having Metabolic Alkalosis in the setting of Diuresis  -Patient's AG was 16 and now  improved to 15 -In the setting of Diuresis  Hyperglycemia in the setting of Diet Controlled Diabetes -Patient's CBG ranging from 86-116 -Checked HbA1c last week was 5.2 -Continue to Monitor and If blood Sugars consistently elevated then will place on Sensitive Novolog SSI AC  Abnormal LFT's, improving  -Patient's LFTs were normal on 3/18 and severely worsened; Likely from Hepatic Congestion in the setting of CHF -AST went from 29 -> 1,049 and ALT went from 18 -> 654 -Now AST is 731 and ALT is 506 -? If Patient has NASH given her Body habitus  -Discontinued Atorvastatin and Acetaminophen and Not complaining of any Abdominal Pain -Is on Heparin 5,000 units sq q8h and was changed yesterday from Enoxaparin -Checked GGT and was 130 (in the setting of CHF) and Lipase Level (34)  -Checked RUQ U/S and showed Mild hepatic steatosis.No gallstones. No biliary dilatation. Small volume of ascites.  Right pleural effusion -Acute Hepatitis Panel Negative  -Continue to Monitor and Trend Hepatic Fxn -Repeat CMP  Hyperbilirubinemia  -Patient's T Bili was 1.6 and improved slightly to 1.3 -Checked RUQ U/S and Acute Hepatitis Panel as above -Continue to Monitor and Repeat CMP in AM   ?Aspiration/Dysphagia -Had some choking and coughing yesterday  -  Made NPO and SLP evaluated -SLP recommending Regular Diet with Nectar Thick Liquids -Added Xopenex/Atrovent for breathing -Continue to Monitor closely   Intermittent Confusion/Delirum -In the setting of Hypoxia -C/w Supplemental O2 via [place] -Will place on Delirium Precautions -Need to closely Monitor while she is on Wellbutrin XL and Venlafaxine  DVT prophylaxis: Enoxaparin 40 mg sq q24h to be D/C'd given worsening Renal Fxn and was started on Heparin 5,000 units sq q8h Code Status: FULL CODE Family Communication: No family present at bedside  Disposition Plan: SNF when medically stable and back to baseline  Consultants:   Cardiology Dr. Kirk Ruths  Procedures:  ECHOCARDIOGRAM 05/07/2018 IMPRESSIONS    1. The left ventricle has hyperdynamic systolic function, with an ejection fraction of >65%. The cavity size was normal. There is mildly increased left ventricular wall thickness. Left ventricular diastolic Doppler parameters are indeterminate.  Indeterminate filling pressures There is incoordinate septal motion.  2. The right ventricle has mildly reduced systolic function. The cavity was mildly enlarged. There is no increase in right ventricular wall thickness.  3. Left atrial size was moderately dilated.  4. Right atrial size was mildly dilated.  5. The mitral valve is degenerative. Mild thickening of the mitral valve leaflet. Moderate calcification of the mitral valve leaflet. There is moderate to severe mitral annular calcification present. Moderate mitral valve stenosis.  6. The tricuspid valve is grossly normal.  7. The aortic valve was not well visualized.  8. The aortic root and ascending aorta are normal in size and structure.  9. The inferior vena cava was dilated in size with <50% respiratory variability. 10. The interatrial septum was not well visualized. 11. When compared to the prior study: 01/29/2018: LVEF 65-70%, moderate LAE, moderate mitral stenosis.  SUMMARY   LVEF 65-70%, incoordinate septal motion, mild LVH, mildly reduced RV systolic function, moderate LAE, mild RA, heavy MAC with moderate mitral stenosis and trivial MR, mild TR, RVSP 53 mmHg, dilated IVC  FINDINGS  Left Ventricle: The left ventricle has hyperdynamic systolic function, with an ejection fraction of >65%. The cavity size was normal. There is mildly increased left ventricular wall thickness. Left ventricular diastolic Doppler parameters are  indeterminate. Indeterminate filling pressures There is incoordinate septal motion. Definity contrast agent was given IV to delineate the left ventricular endocardial borders. Right Ventricle: The  right ventricle has mildly reduced systolic function. The cavity was mildly enlarged. There is no increase in right ventricular wall thickness. Left Atrium: left atrial size was moderately dilated Right Atrium: right atrial size was mildly dilated. Right atrial pressure is estimated at 15 mmHg. Interatrial Septum: The interatrial septum was not well visualized. Pericardium: There is no evidence of pericardial effusion. Mitral Valve: The mitral valve is degenerative in appearance. Mild thickening of the mitral valve leaflet. Moderate calcification of the mitral valve leaflet. There is moderate to severe mitral annular calcification present. Mitral valve regurgitation is  trivial by color flow Doppler. Moderate mitral valve stenosis. Tricuspid Valve: The tricuspid valve is grossly normal. Tricuspid valve regurgitation is mild by color flow Doppler. Aortic Valve: The aortic valve was not well visualized Aortic valve regurgitation was not visualized by color flow Doppler. There is no evidence of aortic valve stenosis. Pulmonic Valve: The pulmonic valve was not well visualized. Pulmonic valve regurgitation is not visualized by color flow Doppler. Aorta: The aortic root and ascending aorta are normal in size and structure. Venous: The inferior vena cava measures 2.60 cm, is dilated in size with less than  50% respiratory variability. Compared to previous exam: 01/29/2018: LVEF 65-70%, moderate LAE, moderate mitral stenosis.   LEFT VENTRICLE PLAX 2D LVIDd:         4.59 cm LVIDs:         3.51 cm LV PW:         1.14 cm LV IVS:        1.05 cm LVOT diam:     2.20 cm LV SV:         46 ml LV SV Index:   17.09 LVOT Area:     3.80 cm  RIGHT VENTRICLE RVSP:           52.9 mmHg  LEFT ATRIUM         Index      RIGHT ATRIUM LA diam:    3.60 cm 1.46 cm/m RA Pressure: 15 mmHg  AORTIC VALVE LVOT Vmax:   56.20 cm/s LVOT Vmean:  36.900 cm/s LVOT VTI:    0.100 m   AORTA Ao Root diam: 2.60 cm  MITRAL  VALVE             TRICUSPID VALVE MV Peak grad: 20.8 mmHg  TR Peak grad:   37.9 mmHg MV Mean grad: 8.0 mmHg   TR Vmax:        308.00 cm/s MV Vmax:      2.28 m/s   RVSP:           52.9 mmHg MV Vmean:     135.0 cm/s MV VTI:       0.51 m     SHUNTS                          Systemic VTI:  0.10 m                          Systemic Diam: 2.20 cm  IVC IVC diam: 2.60 cm   Antimicrobials:  Anti-infectives (From admission, onward)   Start     Dose/Rate Route Frequency Ordered Stop   05/12/18 1100  cefTRIAXone (ROCEPHIN) 1 g in sodium chloride 0.9 % 100 mL IVPB     1 g 200 mL/hr over 30 Minutes Intravenous Every 24 hours 05/12/18 1012       Subjective: Seen and examined at bedside history does not know what happened yesterday when she started choking and coughing.  Was very thirsty this morning and wanted to drink.  SLP in the room attempting to work with her patient.  No nausea or vomiting.  States shortness of breath is better and she has been weaned to 2 L.  No other concerns or complaints at this time but nursing reports intermittent confusion and husband also thinks that the patient is confused but on my evaluation she is alert and oriented.   Objective: Vitals:   05/15/18 0400 05/15/18 0440 05/15/18 0845 05/15/18 1045  BP:  92/77  105/65  Pulse:  77  76  Resp:  18    Temp:  97.6 F (36.4 C)  98.1 F (36.7 C)  TempSrc:  Oral  Oral  SpO2:  100% 100% 100%  Weight: 133.1 kg     Height:        Intake/Output Summary (Last 24 hours) at 05/15/2018 1047 Last data filed at 05/15/2018 0400 Gross per 24 hour  Intake 100 ml  Output 525 ml  Net -425 ml   Filed Weights   05/13/18 0336  05/14/18 0445 05/15/18 0400  Weight: 134.2 kg 133.9 kg 133.1 kg   Examination: Physical Exam:  Constitutional: Well-nourished, well-developed morbidly obese Caucasian female currently no acute distress appears slightly anxious Eyes: Lids and conjunctive are normal.  Sclera nonicteric ENMT: External  ears and nose appear normal.  Grossly normal hearing.  Mucous members are moist Neck: Appears supple no JVD Respiratory: Diminished auscultation bilaterally no appreciable wheezing, rales, guide.  Patient was not tachypneic or using any accessory muscles to breathe but is on 2 L of supplemental oxygen via nasal cannula today Cardiovascular: Regular rate and rhythm.  No appreciable murmurs, rubs, gallops.  Has a 1+ lower extremity edema which is improved Abdomen: Soft, nontender, distended secondary to body habitus.  Bowel sounds present GU: Deferred and has a pure wick in place Musculoskeletal: No contractures or cyanosis.  No joint deformities in the upper and lower extremities Skin: No appreciable rashes or lesions on a limited skin evaluation Neurologic: Cranial nerves II through XII grossly intact no appreciable focal deficits. Psychiatric: Normal judgment and insight.  Patient is awake, alert and oriented x3 today.  Slightly anxious mood and affect  Data Reviewed: I have personally reviewed following labs and imaging studies  CBC: Recent Labs  Lab 05/12/18 0421 05/13/18 0923 05/14/18 0337 05/15/18 0317  WBC 7.4 9.7 8.6 7.3  NEUTROABS  --  8.0* 6.9 5.9  HGB 8.9* 9.8* 9.4* 9.3*  HCT 30.8* 32.6* 30.7* 30.9*  MCV 87.0 86.7 86.2 88.0  PLT 245 230 202 701   Basic Metabolic Panel: Recent Labs  Lab 05/10/18 1451 05/11/18 0433 05/12/18 0421 05/13/18 0518 05/14/18 0337 05/15/18 0317  NA  --  134* 137 137 138 142  K 3.7 3.8 3.9 4.0 3.7 3.1*  CL  --  90* 92* 93* 94* 94*  CO2  --  32 29 28 30  33*  GLUCOSE  --  104* 86 114* 113* 101*  BUN  --  18 26* 37* 36* 28*  CREATININE  --  1.25* 1.54* 1.71* 1.64* 1.51*  CALCIUM  --  8.6* 8.6* 8.2* 8.0* 8.4*  MG 2.0  --   --   --  2.1 2.1  PHOS  --   --   --   --  4.0 3.4   GFR: Estimated Creatinine Clearance: 52.3 mL/min (A) (by C-G formula based on SCr of 1.51 mg/dL (H)). Liver Function Tests: Recent Labs  Lab 05/14/18 0337 05/15/18  0317  AST 1,049* 731*  ALT 654* 506*  ALKPHOS 185* 162*  BILITOT 1.6* 1.3*  PROT 6.1* 6.5  ALBUMIN 2.7* 2.8*   Recent Labs  Lab 05/14/18 0337  LIPASE 34   No results for input(s): AMMONIA in the last 168 hours. Coagulation Profile: No results for input(s): INR, PROTIME in the last 168 hours. Cardiac Enzymes: No results for input(s): CKTOTAL, CKMB, CKMBINDEX, TROPONINI in the last 168 hours. BNP (last 3 results) No results for input(s): PROBNP in the last 8760 hours. HbA1C: No results for input(s): HGBA1C in the last 72 hours. CBG: Recent Labs  Lab 05/10/18 0621  GLUCAP 101*   Lipid Profile: No results for input(s): CHOL, HDL, LDLCALC, TRIG, CHOLHDL, LDLDIRECT in the last 72 hours. Thyroid Function Tests: No results for input(s): TSH, T4TOTAL, FREET4, T3FREE, THYROIDAB in the last 72 hours. Anemia Panel: No results for input(s): VITAMINB12, FOLATE, FERRITIN, TIBC, IRON, RETICCTPCT in the last 72 hours. Sepsis Labs: No results for input(s): PROCALCITON, LATICACIDVEN in the last 168 hours.  Recent Results (from  the past 240 hour(s))  MRSA PCR Screening     Status: None   Collection Time: 05/06/18 10:29 PM  Result Value Ref Range Status   MRSA by PCR NEGATIVE NEGATIVE Final    Comment:        The GeneXpert MRSA Assay (FDA approved for NASAL specimens only), is one component of a comprehensive MRSA colonization surveillance program. It is not intended to diagnose MRSA infection nor to guide or monitor treatment for MRSA infections. Performed at [facility], [place] [address] [place], [place] [address]   Culture, Urine     Status: Abnormal   Collection Time: 05/11/18 10:03 AM  Result Value Ref Range Status   Specimen Description URINE, RANDOM  Final   Special Requests   Final    NONE Performed at [facility], [place] [address], [place], [place] [address]    Culture >=100,000 COLONIES/mL ESCHERICHIA COLI (A)  Final   Report Status 05/13/2018 FINAL   Final   Organism ID, Bacteria ESCHERICHIA COLI (A)  Final      Susceptibility   Escherichia coli - MIC*    AMPICILLIN 16 INTERMEDIATE Intermediate     CEFAZOLIN <=4 SENSITIVE Sensitive     CEFTRIAXONE <=1 SENSITIVE Sensitive     CIPROFLOXACIN <=0.25 SENSITIVE Sensitive     GENTAMICIN <=1 SENSITIVE Sensitive     IMIPENEM <=0.25 SENSITIVE Sensitive     NITROFURANTOIN <=16 SENSITIVE Sensitive     TRIMETH/SULFA <=20 SENSITIVE Sensitive     AMPICILLIN/SULBACTAM 8 SENSITIVE Sensitive     PIP/TAZO 8 SENSITIVE Sensitive     Extended ESBL NEGATIVE Sensitive     * >=100,000 COLONIES/mL ESCHERICHIA COLI      Radiology Studies: Dg Chest Port 1 View  Result Date: 05/15/2018 CLINICAL DATA:  Shortness of breath EXAM: PORTABLE CHEST 1 VIEW COMPARISON:  May 14, 2018 FINDINGS: There is persistent cardiomegaly. Pulmonary vascularity is within normal limits. Currently there is no appreciable edema or consolidation. No adenopathy. No bone lesions. There is aortic atherosclerosis. IMPRESSION: Cardiomegaly. No edema or consolidation. Pulmonary vascularity within normal limits. Aortic Atherosclerosis (ICD10-I70.0). Electronically Signed   By: Lowella Grip III M.D.   On: 05/15/2018 07:35   Dg Chest Port 1 View  Result Date: 05/14/2018 CLINICAL DATA:  Aspiration EXAM: PORTABLE CHEST 1 VIEW COMPARISON:  May 14, 2018 study obtained earlier in the day FINDINGS: There is cardiomegaly with mild pulmonary venous hypertension. There are pleural effusions bilaterally. There is no appreciable edema or consolidation. There is aortic atherosclerosis. No adenopathy. No bone lesions. IMPRESSION: Pulmonary vascular congestion with small pleural effusions bilaterally. No frank edema or consolidation. Aortic Atherosclerosis (ICD10-I70.0). Electronically Signed   By: Lowella Grip III M.D.   On: 05/14/2018 16:00   Dg Chest Port 1 View  Result Date: 05/14/2018 CLINICAL DATA:  Short of breath EXAM: PORTABLE CHEST 1  VIEW COMPARISON:  05/06/2018 FINDINGS: Cardiac enlargement with mild progression of vascular congestion. Progression of bibasilar atelectasis and small effusions. Atherosclerotic ascending aorta and aortic arch. IMPRESSION: Congestive heart failure with mild progression. Electronically Signed   By: Franchot Gallo M.D.   On: 05/14/2018 08:31   US Abdomen Limited Ruq  Result Date: 05/14/2018 CLINICAL DATA:  Elevated LFTs. EXAM: ULTRASOUND ABDOMEN LIMITED RIGHT UPPER QUADRANT COMPARISON:  None. FINDINGS: Gallbladder: Physiologically distended. No gallstones. No significant gallbladder wall thickening. No sonographic Murphy sign noted by sonographer. Common bile duct: Diameter: 5 mm. Liver: No focal lesion identified. Diffusely heterogeneous and slightly increased in  parenchymal echogenicity. Portal vein is patent on color Doppler imaging with normal direction of blood flow towards the liver. Small amount of right upper quadrant ascites. Right pleural effusion noted. IMPRESSION: 1. Mild hepatic steatosis. 2. No gallstones.  No biliary dilatation. 3. Small volume of ascites.  Right pleural effusion. Electronically Signed   By: Keith Rake M.D.   On: 05/14/2018 22:18    Scheduled Meds: . buPROPion  150 mg Oral Daily  . clopidogrel  75 mg Oral Daily  . furosemide  40 mg Oral BID  . heparin injection (subcutaneous)  5,000 Units Subcutaneous Q8H  . ipratropium  0.5 mg Nebulization TID  . iron polysaccharides  150 mg Oral Daily  . levalbuterol  0.63 mg Nebulization TID  . mouth rinse  15 mL Mouth Rinse BID  . metoprolol succinate  12.5 mg Oral Daily  . mirabegron ER  25 mg Oral Daily  . multivitamin with minerals  1 tablet Oral Daily  . pantoprazole  40 mg Oral Daily  . potassium chloride  20 mEq Oral BID  . potassium chloride  40 mEq Oral BID  . senna-docusate  1 tablet Oral BID  . sodium chloride flush  3 mL Intravenous Q12H  . venlafaxine  112.5 mg Oral Q breakfast   And  . venlafaxine  37.5  mg Oral QAC supper   Continuous Infusions: . sodium chloride 250 mL (05/12/18 1322)  . cefTRIAXone (ROCEPHIN)  IV 1 g (05/14/18 1233)    LOS: 9 days   Kerney Elbe, DO [facility] PAGER is on [place]  If 7PM-7AM, please contact night-coverage [email or web address] Password [facility] 05/15/2018, 10:47 AM

## 2018-05-15 NOTE — Progress Notes (Signed)
Progress Note  Patient Name: Shannon Carroll Date of Encounter: 05/15/2018  Primary Cardiologist: Minus Breeding, MD   Subjective   No CP or abdominal pain; no dyspnea  Inpatient Medications    Scheduled Meds: . buPROPion  150 mg Oral Daily  . clopidogrel  75 mg Oral Daily  . furosemide  40 mg Intravenous BID  . heparin injection (subcutaneous)  5,000 Units Subcutaneous Q8H  . ipratropium  0.5 mg Nebulization TID  . iron polysaccharides  150 mg Oral Daily  . levalbuterol  0.63 mg Nebulization TID  . mouth rinse  15 mL Mouth Rinse BID  . metoprolol succinate  12.5 mg Oral Daily  . mirabegron ER  25 mg Oral Daily  . multivitamin with minerals  1 tablet Oral Daily  . pantoprazole  40 mg Oral Daily  . potassium chloride  20 mEq Oral BID  . potassium chloride  40 mEq Oral BID  . senna-docusate  1 tablet Oral BID  . sodium chloride flush  3 mL Intravenous Q12H  . venlafaxine  112.5 mg Oral Q breakfast   And  . venlafaxine  37.5 mg Oral QAC supper   Continuous Infusions: . sodium chloride 250 mL (05/12/18 1322)  . cefTRIAXone (ROCEPHIN)  IV 1 g (05/14/18 1233)   PRN Meds: sodium chloride, clonazePAM, dextromethorphan-guaiFENesin, hydrALAZINE, hydrOXYzine, levalbuterol, Resource ThickenUp Clear, sodium chloride flush   Vital Signs    Vitals:   05/15/18 0201 05/15/18 0400 05/15/18 0440 05/15/18 0845  BP:   92/77   Pulse:   77   Resp:   18   Temp:   97.6 F (36.4 C)   TempSrc:   Oral   SpO2: 99%  100% 100%  Weight:  133.1 kg    Height:        Intake/Output Summary (Last 24 hours) at 05/15/2018 0954 Last data filed at 05/15/2018 0400 Gross per 24 hour  Intake 100 ml  Output 525 ml  Net -425 ml   Last 3 Weights 05/15/2018 05/15/2018 05/14/2018  Weight (lbs) 293 lb 6.9 oz (No Data) 295 lb 3.1 oz  Weight (kg) 133.1 kg (No Data) 133.9 kg      Telemetry    Sinus with brief PAT- Personally Reviewed  Physical Exam   GEN: Obese, WD, NAD Neck: JVP difficult to  assess Cardiac: RRR, 2/6 SM Respiratory: CTA GI: Soft, no masses palpated MS: 1+ thigh edema; lower ext wrapped Neuro:  No focal findings  Labs    Chemistry Recent Labs  Lab 05/13/18 0518 05/14/18 0337 05/15/18 0317  NA 137 138 142  K 4.0 3.7 3.1*  CL 93* 94* 94*  CO2 28 30 33*  GLUCOSE 114* 113* 101*  BUN 37* 36* 28*  CREATININE 1.71* 1.64* 1.51*  CALCIUM 8.2* 8.0* 8.4*  PROT  --  6.1* 6.5  ALBUMIN  --  2.7* 2.8*  AST  --  1,049* 731*  ALT  --  654* 506*  ALKPHOS  --  185* 162*  BILITOT  --  1.6* 1.3*  GFRNONAA 30* 32* 35*  GFRAA 35* 37* 41*  ANIONGAP 16* 14 15     Hematology Recent Labs  Lab 05/13/18 0923 05/14/18 0337 05/15/18 0317  WBC 9.7 8.6 7.3  RBC 3.76* 3.56* 3.51*  HGB 9.8* 9.4* 9.3*  HCT 32.6* 30.7* 30.9*  MCV 86.7 86.2 88.0  MCH 26.1 26.4 26.5  MCHC 30.1 30.6 30.1  RDW 17.8* 17.9* 17.9*  PLT 230 202 183  Patient Profile     68 y.o. female with past medical history of mild aortic stenosis/moderate mitral stenosis, pulmonary hypertension, hypertension, diabetes mellitus, hyperlipidemia, prior stroke, chronic stage III kidney disease, obstructive sleep apnea admitted with acute on chronic diastolic congestive heart failure.  Also noted to have SVT on telemetry.  Assessment & Plan    1 acute on chronic diastolic congestive heart failure-I/O -185.  She remains mildly volume overloaded on exam.  Renal function stable this morning.  Change Lasix to 40 mg by mouth twice daily.  Follow renal function closely.  A large component of this is likely right heart failure related to pulmonary hypertension from obesity hypoventilation syndrome, sleep apnea and pulmonary venous hypertension.     2 pulmonary hypertension-likely combination of pulmonary venous hypertension related to valvular heart disease as well as obstructive sleep apnea and obesity hypoventilation syndrome.  3 supraventricular tachycardia-brief PAT on telemetry.  Continue low-dose  metoprolol.  Cannot advance due to borderline blood pressure.  4 history of moderate mitral stenosis and mild aortic stenosis-we will need follow-up echoes in the future.  5 chronic stage III kidney disease-renal function unchanged today.  Will recheck tomorrow.  6 obstructive sleep apnea-needs sleep evaluation following discharge.  7 morbid obesity-needs significant weight loss.  8 elevated liver functions-some improvement this morning.  Etiology unclear.  Right upper quadrant ultrasound unrevealing.  Hepatitis serologies pending.  Passive congestion seems less likely as her LFTs were normal on March 18 and she has been diuresed since that time.  Continue off of Lipitor and Tylenol for now.  Evaluation per primary care.  9 hypokalemia-supplement.  For questions or updates, please contact [place] Please consult [email or web address] for contact info under        Signed, Kirk Ruths, MD  05/15/2018, 9:54 AM

## 2018-05-15 NOTE — Care Management Important Message (Signed)
Important Message  Patient Details  Name: Shannon Carroll MRN: [id number] Date of Birth: [birth year]   Medicare Important Message Given:  Yes    Jenia Klepper Montine Circle 05/15/2018, 4:43 PM

## 2018-05-15 NOTE — Progress Notes (Signed)
Physical Therapy Treatment Patient Details Name: Shannon Carroll MRN: [id number] DOB: 1950/04/06 Today's Date: 05/15/2018    History of Present Illness 68 year old female with a history of hypertension, hyperlipidemia, diet-controlled diabetes mellitus, stroke, depression, CKD stage III, diastolic CHF, aortic valve stenosis came with shortness of breath and bilateral lower extremity edema.  Patient admitted with acute on chronic diastolic CHF.    PT Comments    Pt admitted with above diagnosis. Pt currently with functional limitations due to balance and endurance deficits. Pt was able to stand with [name] for 6 minutes and then 2 minutes.  Pt weight shifting in [place] plus as well.  Progressing well.   Pt will benefit from skilled PT to increase their independence and safety with mobility to allow discharge to the venue listed below.     Follow Up Recommendations  SNF;Supervision for mobility/OOB     Equipment Recommendations  None recommended by PT    Recommendations for Other Services       Precautions / Restrictions Precautions Precautions: Fall Restrictions Weight Bearing Restrictions: No    Mobility  Bed Mobility Overal bed mobility: Needs Assistance Bed Mobility: Rolling;Sit to Supine Rolling: Mod assist;Min assist   Supine to sit: Mod assist;+2 for safety/equipment;HOB elevated     General bed mobility comments: used bedrails and therapist assisted with chux pad.   Transfers Overall transfer level: Needs assistance Equipment used: Rolling walker (2 wheeled) Transfers: Sit to/from Stand Sit to Stand: Mod assist;From elevated surface;+2 safety/equipment         General transfer comment: Obtained [name] which helped pt with sitting EOB as she could hold on and was strapped in.  [name] lifted pt with pt assisting somewhat. Was able to achieve full stand.  Stood for up to 6 min and rested in chair and then  stood again for 2 min.  Incr standing tolerance.  Pt  weight shifted while in the [place] as well.   Ambulation/Gait                 Stairs             Wheelchair Mobility    Modified Rankin (Stroke Patients Only) Modified Rankin (Stroke Patients Only) Pre-Morbid Rankin Score: Moderately severe disability Modified Rankin: Severe disability     Balance Overall balance assessment: Needs assistance Sitting-balance support: Bilateral upper extremity supported;Feet supported Sitting balance-Leahy Scale: Poor Sitting balance - Comments: significant posterior lean, unable to maintain sitting balance without UE support or external assist. Postural control: Posterior lean Standing balance support: Bilateral upper extremity supported Standing balance-Leahy Scale: Poor Standing balance comment: requires physical assist and use of [name]                            Cognition Arousal/Alertness: Awake/alert Behavior During Therapy: Flat affect Overall Cognitive Status: No family/caregiver present to determine baseline cognitive functioning                                        Exercises General Exercises - Lower Extremity Ankle Circles/Pumps: AROM;Both;15 reps;Seated Long Arc Quad: AROM;Both;10 reps;Seated Heel Slides: AROM;Both;10 reps;Supine    General Comments        Pertinent Vitals/Pain Pain Assessment: No/denies pain    Home Living  Prior Function            PT Goals (current goals can now be found in the care plan section) Acute Rehab PT Goals Patient Stated Goal: to get stronger Progress towards PT goals: Progressing toward goals    Frequency    Min 2X/week      PT Plan Current plan remains appropriate    Co-evaluation              AM-PAC PT "6 Clicks" Mobility   Outcome Measure  Help needed turning from your back to your side while in a flat bed without using bedrails?: A Lot Help needed moving from lying on your back to  sitting on the side of a flat bed without using bedrails?: A Lot Help needed moving to and from a bed to a chair (including a wheelchair)?: Total Help needed standing up from a chair using your arms (e.g., wheelchair or bedside chair)?: Total Help needed to walk in hospital room?: Total Help needed climbing 3-5 steps with a railing? : Total 6 Click Score: 8    End of Session Equipment Utilized During Treatment: Oxygen;Gait belt Activity Tolerance: Patient limited by fatigue Patient left: with call bell/phone within reach;in chair Nurse Communication: Mobility status;Need for lift equipment(nurse aware to use [name] to get pt back to bed) PT Visit Diagnosis: Muscle weakness (generalized) (M62.81);Difficulty in walking, not elsewhere classified (R26.2)     Time: 8099-8338 PT Time Calculation (min) (ACUTE ONLY): 24 min  Charges:  $Gait Training: 8-22 mins $Therapeutic Activity: 8-22 mins                     [place] Pager:  [phone]  Office:  [place] 05/15/2018, 11:28 AM

## 2018-05-15 NOTE — Progress Notes (Signed)
Occupational Therapy Treatment Patient Details Name: Shannon Carroll MRN: [id number] DOB: 06-03-1950 Today's Date: 05/15/2018    History of present illness 68 year old female with a history of hypertension, hyperlipidemia, diet-controlled diabetes mellitus, stroke, depression, CKD stage III, diastolic CHF, aortic valve stenosis came with shortness of breath and bilateral lower extremity edema.  Patient admitted with acute on chronic diastolic CHF.   OT comments  Pt progressing towards acute OT goals. Focus of session was UB strengthening exercises and grooming task. Pt needing tactile cues for theraband exercise positions. Difficulty dual tasking. D/c plan remains appropriate.    Follow Up Recommendations  SNF    Equipment Recommendations  None recommended by OT    Recommendations for Other Services      Precautions / Restrictions Precautions Precautions: Fall Restrictions Weight Bearing Restrictions: No       Mobility Bed Mobility Overal bed mobility: Needs Assistance Bed Mobility: Rolling;Sit to Supine Rolling: Mod assist;Min assist   Supine to sit: Mod assist;+2 for safety/equipment;HOB elevated     General bed mobility comments: up in recliner  Transfers Overall transfer level: Needs assistance Equipment used: Rolling walker (2 wheeled) Transfers: Sit to/from Stand Sit to Stand: Mod assist;From elevated surface;+2 safety/equipment         General transfer comment: Obtained [name] which helped pt with sitting EOB as she could hold on and was strapped in.  [name] lifted pt with pt assisting somewhat. Was able to achieve full stand.  Stood for up to 6 min and rested in chair and then  stood again for 2 min.  Incr standing tolerance.  Pt weight shifted while in the [name] as well.     Balance Overall balance assessment: Needs assistance Sitting-balance support: Bilateral upper extremity supported;Feet supported Sitting balance-Leahy Scale: Poor Sitting balance -  Comments: significant posterior lean, unable to maintain sitting balance without UE support or external assist. Postural control: Posterior lean Standing balance support: Bilateral upper extremity supported Standing balance-Leahy Scale: Poor Standing balance comment: requires physical assist and use of [name]                           ADL either performed or assessed with clinical judgement   ADL Overall ADL's : Needs assistance/impaired     Grooming: Brushing hair;Sitting Grooming Details (indicate cue type and reason): cues to orient direction of bristles on comb.                               General ADL Comments: Pt completed UB theraband exercises and grooming task     Vision       Perception     Praxis      Cognition Arousal/Alertness: Awake/alert Behavior During Therapy: Flat affect Overall Cognitive Status: No family/caregiver present to determine baseline cognitive functioning                                 General Comments: pt had difficulty following motor commands; tactile cues needed to perform UE exercises. Appears to have difficulty dual tasking        Exercises Exercises: Other exercises General Exercises - Lower Extremity Ankle Circles/Pumps: AROM;Both;15 reps;Seated Long Arc Quad: AROM;Both;10 reps;Seated Heel Slides: AROM;Both;10 reps;Supine Other Exercises Other Exercises: performed bil biceps with level 2 theraband with therapist holding other end as pt could not  manage this cognitively, 2 sets of 8 each arm Other Exercises: AROM 2 sets of 5 horizontal abd/add   Shoulder Instructions       General Comments      Pertinent Vitals/ Pain       Pain Assessment: No/denies pain  Home Living                                          Prior Functioning/Environment              Frequency  Min 2X/week        Progress Toward Goals  OT Goals(current goals can now be found in the  care plan section)  Progress towards OT goals: Progressing toward goals  Acute Rehab OT Goals Patient Stated Goal: to get stronger OT Goal Formulation: With patient Time For Goal Achievement: 05/23/18 Potential to Achieve Goals: Good ADL Goals Pt Will Transfer to Toilet: with mod assist;stand pivot transfer;bedside commode Pt/caregiver will Perform Home Exercise Program: Both right and left upper extremity;With theraband;With written HEP provided;With Supervision Additional ADL Goal #1: Pt will perform bed mobility modified independently. Additional ADL Goal #2: Pt will demonstrate fair sitting balance at EOB in preparation for ADL.  Plan Discharge plan remains appropriate    Co-evaluation                 AM-PAC OT "6 Clicks" Daily Activity     Outcome Measure   Help from another person eating meals?: A Little Help from another person taking care of personal grooming?: A Little Help from another person toileting, which includes using toliet, bedpan, or urinal?: Total Help from another person bathing (including washing, rinsing, drying)?: A Lot Help from another person to put on and taking off regular upper body clothing?: A Little Help from another person to put on and taking off regular lower body clothing?: Total 6 Click Score: 13    End of Session    OT Visit Diagnosis: Unsteadiness on feet (R26.81);Other abnormalities of gait and mobility (R26.89);Muscle weakness (generalized) (M62.81);Other symptoms and signs involving cognitive function   Activity Tolerance Patient limited by fatigue   Patient Left in chair;with call bell/phone within reach;with chair alarm set   Nurse Communication          Time: [phone] OT Time Calculation (min): 10 min  Charges: OT General Charges $OT Visit: 1 Visit OT Treatments $Therapeutic Exercise: 8-22 mins  Tyrone Schimke, OT Acute Rehabilitation Services Pager: [phone] Office: [phone]    Hortencia Pilar 05/15/2018, 2:13 PM

## 2018-05-15 NOTE — Progress Notes (Signed)
Patient is alert and oriented   Pt refused to to take meds with thicken ordered by speech. MD notified. Will continue to monitor the patient.

## 2018-05-16 ENCOUNTER — Inpatient Hospital Stay (HOSPITAL_COMMUNITY): Payer: Medicare Other

## 2018-05-16 DIAGNOSIS — E785 Hyperlipidemia, unspecified: Secondary | ICD-10-CM

## 2018-05-16 DIAGNOSIS — N183 Chronic kidney disease, stage 3 (moderate): Secondary | ICD-10-CM

## 2018-05-16 DIAGNOSIS — E876 Hypokalemia: Secondary | ICD-10-CM

## 2018-05-16 DIAGNOSIS — R945 Abnormal results of liver function studies: Secondary | ICD-10-CM

## 2018-05-16 DIAGNOSIS — E662 Morbid (severe) obesity with alveolar hypoventilation: Secondary | ICD-10-CM

## 2018-05-16 DIAGNOSIS — R400 Somnolence: Secondary | ICD-10-CM

## 2018-05-16 LAB — COMPREHENSIVE METABOLIC PANEL
ALT: 362 U/L — ABNORMAL HIGH (ref 0–44)
AST: 450 U/L — AB (ref 15–41)
Albumin: 2.8 g/dL — ABNORMAL LOW (ref 3.5–5.0)
Alkaline Phosphatase: 167 U/L — ABNORMAL HIGH (ref 38–126)
Anion gap: 13 (ref 5–15)
BUN: 25 mg/dL — AB (ref 8–23)
CALCIUM: 8.6 mg/dL — AB (ref 8.9–10.3)
CO2: 33 mmol/L — ABNORMAL HIGH (ref 22–32)
CREATININE: 1.42 mg/dL — AB (ref 0.44–1.00)
Chloride: 98 mmol/L (ref 98–111)
GFR calc Af Amer: 44 mL/min — ABNORMAL LOW (ref >60)
GFR calc non Af Amer: 38 mL/min — ABNORMAL LOW (ref >60)
Glucose, Bld: 106 mg/dL — ABNORMAL HIGH (ref 70–99)
Potassium: 3.6 mmol/L (ref 3.5–5.1)
Sodium: 144 mmol/L (ref 135–145)
Total Bilirubin: 1.2 mg/dL (ref 0.3–1.2)
Total Protein: 6.5 g/dL (ref 6.5–8.1)

## 2018-05-16 LAB — CBC WITH DIFFERENTIAL/PLATELET
Abs Immature Granulocytes: 0.06 10*3/uL (ref 0.00–0.07)
Basophils Absolute: 0 10*3/uL (ref 0.0–0.1)
Basophils Relative: 0 %
Eosinophils Absolute: 0.1 10*3/uL (ref 0.0–0.5)
Eosinophils Relative: 1 %
HCT: 32.7 % — ABNORMAL LOW (ref 36.0–46.0)
Hemoglobin: 9.2 g/dL — ABNORMAL LOW (ref 12.0–15.0)
IMMATURE GRANULOCYTES: 1 %
LYMPHS PCT: 8 %
Lymphs Abs: 0.5 10*3/uL — ABNORMAL LOW (ref 0.7–4.0)
MCH: 24.9 pg — ABNORMAL LOW (ref 26.0–34.0)
MCHC: 28.1 g/dL — ABNORMAL LOW (ref 30.0–36.0)
MCV: 88.4 fL (ref 80.0–100.0)
Monocytes Absolute: 0.4 10*3/uL (ref 0.1–1.0)
Monocytes Relative: 6 %
Neutro Abs: 5.5 10*3/uL (ref 1.7–7.7)
Neutrophils Relative %: 84 %
Platelets: 185 10*3/uL (ref 150–400)
RBC: 3.7 MIL/uL — ABNORMAL LOW (ref 3.87–5.11)
RDW: 17.9 % — ABNORMAL HIGH (ref 11.5–15.5)
WBC: 6.6 10*3/uL (ref 4.0–10.5)
nRBC: 0 % (ref 0.0–0.2)

## 2018-05-16 LAB — BLOOD GAS, ARTERIAL
Acid-Base Excess: 12.6 mmol/L — ABNORMAL HIGH (ref 0.0–2.0)
Bicarbonate: 37.3 mmol/L — ABNORMAL HIGH (ref 20.0–28.0)
Drawn by: 237031
FIO2: 28
O2 Saturation: 98.8 %
PO2 ART: 125 mmHg — AB (ref 83.0–108.0)
Patient temperature: 98.6
pCO2 arterial: 54.2 mmHg — ABNORMAL HIGH (ref 32.0–48.0)
pH, Arterial: 7.452 — ABNORMAL HIGH (ref 7.350–7.450)

## 2018-05-16 LAB — MAGNESIUM: Magnesium: 2.2 mg/dL (ref 1.7–2.4)

## 2018-05-16 LAB — PHOSPHORUS: Phosphorus: 2.4 mg/dL — ABNORMAL LOW (ref 2.5–4.6)

## 2018-05-16 MED ORDER — VENLAFAXINE HCL 37.5 MG PO TABS
37.5000 mg | ORAL_TABLET | Freq: Every day | ORAL | Status: DC
  Administered 2018-05-18: 37.5 mg via ORAL
  Filled 2018-05-16 (×4): qty 1

## 2018-05-16 MED ORDER — FUROSEMIDE 10 MG/ML IJ SOLN
40.0000 mg | Freq: Once | INTRAMUSCULAR | Status: AC
  Administered 2018-05-16: 40 mg via INTRAVENOUS
  Filled 2018-05-16: qty 4

## 2018-05-16 MED ORDER — VENLAFAXINE HCL 75 MG PO TABS
112.5000 mg | ORAL_TABLET | Freq: Every day | ORAL | Status: DC
  Administered 2018-05-16 – 2018-05-19 (×3): 112.5 mg via ORAL
  Filled 2018-05-16 (×4): qty 1

## 2018-05-16 MED ORDER — METHYLPREDNISOLONE SODIUM SUCC 125 MG IJ SOLR
60.0000 mg | Freq: Two times a day (BID) | INTRAMUSCULAR | Status: DC
  Administered 2018-05-16 – 2018-05-19 (×7): 60 mg via INTRAVENOUS
  Filled 2018-05-16 (×7): qty 2

## 2018-05-16 MED ORDER — BUDESONIDE 0.25 MG/2ML IN SUSP
0.2500 mg | Freq: Two times a day (BID) | RESPIRATORY_TRACT | Status: DC
  Administered 2018-05-16 – 2018-05-19 (×6): 0.25 mg via RESPIRATORY_TRACT
  Filled 2018-05-16 (×6): qty 2

## 2018-05-16 MED ORDER — K PHOS MONO-SOD PHOS DI & MONO 155-852-130 MG PO TABS
500.0000 mg | ORAL_TABLET | Freq: Every day | ORAL | Status: AC
  Filled 2018-05-16: qty 2

## 2018-05-16 MED ORDER — FUROSEMIDE 80 MG PO TABS
80.0000 mg | ORAL_TABLET | Freq: Two times a day (BID) | ORAL | Status: DC
  Administered 2018-05-16 – 2018-05-19 (×4): 80 mg via ORAL
  Filled 2018-05-16 (×5): qty 1

## 2018-05-16 NOTE — Progress Notes (Signed)
Patient resting comfortably during shift report. Denies complaints.  

## 2018-05-16 NOTE — Progress Notes (Signed)
PROGRESS NOTE    Shannon Carroll  JKK:[id number] DOB: 1950/04/11 DOA: 05/06/2018 PCP: Harlan Stains, MD   Brief Narrative:  The patient is a 68 year old female with a history of hypertension, hyperlipidemia, diet-controlled diabetes mellitus, stroke, depression, CKD stage III, diastolic CHF, aortic valve stenosis and other comorbidities who presented with shortness of breath and bilateral lower extremity edema.  Patient admitted with acute on chronic diastolic CHF. She has been started on Diuresis and has improved significantly but Renal Function started worsening so Cardiology decreased Diuresis yesterday and Cr slightly improved. Hospitalization has also been complicated by Abnormal LFT's so workup has been initiated with RUQ U/S and Acute Hepatitis Panel which has been unremarkable. Also has been complicated by ?Aspiration and Dysphagia and SLP recommending Regular Diet with Nectar Thick Liquids but patient refusing them so will hold until SLP re-evaluates today. Was more somnolent this AM and states she had a rough night.     Assessment & Plan:   Principal Problem:   Acute on chronic diastolic CHF (congestive heart failure) (HCC) Active Problems:   Hyperlipidemia   Depression   Hypertension   Stroke (HCC)   CKD (chronic kidney disease), stage III (HCC)   Normocytic anemia   Hypokalemia   Acute on chronic diastolic (congestive) heart failure (HCC)   Abnormal LFTs   Acute on chronic congestive heart failure (HCC)   SOB (shortness of breath)   Hyperbilirubinemia  Acute Respiratory Failure with Hypoxia 2/2 to Diastolic CHF -See As below -Continuous Pulse Oximetry and Maintain O2 Saturations >90% -Continue Supplemental O2 via [place] and Wean O2 as tolerated -C/w Diuresis as below and given IV Lasix this AM again; Cardiology recommending increasing po Lasix to 80 mg po BID  -Will need Home Ambulatory Screen Prior to D/C -C/w Dextromethorphan-Guaifenesin 1 tab po BIDprn Cough -Added  Xopenex/Atrovent q6h and reduced to TID; -Will Add Budesonide and Add Solumedrol IV 60 mg q12h given her wheezing -ABG done this AM because of Somnolence and showed 7.542/54.2/125/37.3/98.8% on 2 Liters -Repeat CXR this AM showed Cardiomegaly with mild perihilar edema. Small to moderate bilateral pleural effusions, increased. No pneumothorax. -Will get OOB to Chair  -Repeat CXR in AM   Acute on Chronic Diastolic CHF -Slowly improving but slightly worsened today, patient presented with dyspnea, leg edema, BNP 1137 on admission and repeat was 1,077. -There was Vascular congestion on chest x-ray.   -2D echo in December 2019 showed grade 2 diastolic dysfunction.  -Strict I's/O's, Daily Weights, and Fluid Restriction to 1500 mL -Cardiology feels that patient has right heart failure and had restarted Lasix 80 mg every 12 hours along with Demadex but now Demadex has been discontinued and Lasix had been reduced to IV 40 mg BID; Renal Fxn worsened so it was changed to po 40 mg BID per Cardiology yesterday and now titrated up to 80 mg BID; Will get an additional Dose of IV Lasix 40 mg this AM  -Patient is -15 lbs from admission and is also -7.045 Liters -Continue to Monitor Volume Status Carefully  -Repeat CXR this AM showed "Cardiomegaly with mild perihilar edema. Small to moderate bilateral pleural effusions, increased. No pneumothorax." -Repeat CXR in AM -Continue Diuresis per Cardiology and Continue to Monitor Volume Status carefully   Hyperlipidemia -Will Discontinue Atorvastatin 40 mg po daily given Abnormal LFTs -Resume in the outpatient setting   Hypertension -Blood pressure is stable and is 106/66  -Not on Amlodipine while currently hospitalized -C/w IV Diuresis per Cardiology as above -C/w Metoprolol  Succinate 12.5 mg po Daily  -C/w IV Hydralazine 5 mg IV q2hprn for SBP>175  Depression/Anxiety -Continue Bupropion 150 mg po Daily and Clonazepam 0.5 mg po BIDprn Anxiety -Also will  continue with Hydroxyzine 10 mg po Daily PRN -C/w Venlafaxine 112.5 mg po Daily with Breakfast and 37.5 mg po daily with Supper  History of CVA -C/w Clopidogrel 75 mg po Daily and holding Atorvastatin 40 mg po Daily   AKI on CKD stage III -Creatinine normally at baseline 1.0-1.3.  Creatinine had worsened and was 1.71 and Secondary to diuresis.  -Cardiology adjusted po Lasix and increased it to 80 mg BID and she was given an additional dose of IV Lasix 40 mg this AM   -BUN/Cr slightly improved to 25/1.42 -Continue to Monitor and Trend Renal Function as she is being diuresed  -Repeat CMP in AM   Hypokalemia -Improved. K+ is now 3.6 -Continue to Monitor and Replete with po KCl 20 mEQ BID while being diuresed  -Given additional 40 mEQ po KCl  -Repeat CMP in AM   Normocytic Anemia -Patient's Hgb/Hct went from 8.9/30.8 -> 9.8/32.6 -> 9.4/30.7 -> 9.3/30.9 -> 9.2/32.7 -Anemia panel on admission showed an iron level of 21, U IBC of 200, TIBC of 221, saturation ratios of 9%, ferritin level 77, folate level 21.7, and vitamin B12 of 686 -Continue with Niferex 150 mg po Daily  -Continue to monitor for signs and symptoms of bleeding -Repeat CMP in a.m.  ?UTI/Asymptomatic Bacteriuria -Initial U/A showed Clear color appearance, strong color urine, positive nitrites, few bacteria, and 0-5 WBCs -This was repeated on 323 -Patient had abnormal UA and, with no Dysuria currently but ? If she did have dysuria previously -Urine culture has been ordered and showed >100,000 CFU of E Coli that was only resistant to Ampicillin  -She was started IV Rocephin 1 g IV daily and will treat for at least 3 Days given that it is unclear if she had symptoms or not but will go to 5 days given intermittent confusion reported by Nursing staff; Ceftriaxone to stop today   SVT -Patient had episode of SVT 2 days ago, currently she is in normal sinus rhythm.  No more episodes of SVT.   -Continue Metoprolol Succinate 12.5  mg po Daily and IV Lopressor has been stopped  -C/w Telemetry  Morbid Obesity -Estimated body mass index is 45.66 kg/m as calculated from the following:   Height as of this encounter: _0  (1.727 m).   Weight as of this encounter: 136.2 kg. -Weight Loss and Dietary Counseling Given   Mitral Valve and Aortic Valve Stenosis -Per Cardiology will need Follow up ECHOes in the Future  Pulmonary HTN -Likely related to Valvular Heart Disease as well as OSA/OHS -Diuresis per Cardiology as above; Now on po as above -Breathing Treatments as above   Suspected OSA -Needs outpatient Sleep Study -Will add CPAP qHS  Increased AG; Now having Metabolic Alkalosis in the setting of Diuresis  -Patient's AG was 16 and now improved to  14; CO2 is 33 -In the setting of Diuresis  Hyperglycemia in the setting of Diet Controlled Diabetes -Patient's CBG ranging from 86-116 -Checked HbA1c last week was 5.2 -Continue to Monitor and If blood Sugars consistently elevated then will place on Sensitive Novolog SSI AC  Abnormal LFT's, improving  -Patient's LFTs were normal on 3/18 and severely worsened; Likely from Hepatic Congestion in the setting of CHF -AST went from 29 -> 1,049 and ALT went from 18 -> 654 -  Now AST is 450 and ALT is 362 -? If Patient has NASH given her Body habitus  -Discontinued Atorvastatin and Acetaminophen and Not complaining of any Abdominal Pain -Is on Heparin 5,000 units sq q8h and was changed yesterday from Enoxaparin -Checked GGT and was 130 (in the setting of CHF) and Lipase Level (34)  -Checked RUQ U/S and showed Mild hepatic steatosis.No gallstones. No biliary dilatation. Small volume of ascites.  Right pleural effusion -Acute Hepatitis Panel Negative  -Continue to Monitor and Trend Hepatic Fxn -Repeat CMP  Hyperbilirubinemia  -Patient's T Bili was 1.6 and improved slightly to 1.2 -Checked RUQ U/S and Acute Hepatitis Panel as above -Continue to Monitor and Repeat CMP in AM    ?Aspiration/Dysphagia -Had some choking and coughing yesterday  -Made NPO and SLP evaluated -SLP recommending Regular Diet with Nectar Thick Liquids but patient has been refusing Nectar Thick Liquids; Will have SLP re-evaluate  -Added Xopenex/Atrovent for breathing -Continue to Monitor closely   Intermittent Confusion/Delirum -In the setting of Hypoxia -C/w Supplemental O2 via [place] -Will place on Delirium Precautions -Need to closely Monitor while she is on Wellbutrin XL and Venlafaxine  Somnolence -Checked ABG and as above -Had worsening respiratory Symptoms with Wheezing today ad likely from Pulmonary Edema -States she had a rough night and did not sleep well -Continue to Monitor Closely   Hypophosphatemia -Patient's Phos Level this AM was 2.4 -Replete with po K Phos Neutral 500 mg x1 -Continue to Monitor and Replete as Necessary -Repeat Phos Level in the AM   DVT prophylaxis: Enoxaparin 40 mg sq q24h to be D/C'd given worsening Renal Fxn and was started on Heparin 5,000 units sq q8h Code Status: FULL CODE Family Communication: No family present at bedside  Disposition Plan: SNF when medically stable and back to baseline and adequately diuresed   Consultants:   Cardiology Dr. Kirk Ruths  Procedures:  ECHOCARDIOGRAM 05/07/2018 IMPRESSIONS    1. The left ventricle has hyperdynamic systolic function, with an ejection fraction of >65%. The cavity size was normal. There is mildly increased left ventricular wall thickness. Left ventricular diastolic Doppler parameters are indeterminate.  Indeterminate filling pressures There is incoordinate septal motion.  2. The right ventricle has mildly reduced systolic function. The cavity was mildly enlarged. There is no increase in right ventricular wall thickness.  3. Left atrial size was moderately dilated.  4. Right atrial size was mildly dilated.  5. The mitral valve is degenerative. Mild thickening of the mitral valve  leaflet. Moderate calcification of the mitral valve leaflet. There is moderate to severe mitral annular calcification present. Moderate mitral valve stenosis.  6. The tricuspid valve is grossly normal.  7. The aortic valve was not well visualized.  8. The aortic root and ascending aorta are normal in size and structure.  9. The inferior vena cava was dilated in size with <50% respiratory variability. 10. The interatrial septum was not well visualized. 11. When compared to the prior study: 01/29/2018: LVEF 65-70%, moderate LAE, moderate mitral stenosis.  SUMMARY   LVEF 65-70%, incoordinate septal motion, mild LVH, mildly reduced RV systolic function, moderate LAE, mild RA, heavy MAC with moderate mitral stenosis and trivial MR, mild TR, RVSP 53 mmHg, dilated IVC  FINDINGS  Left Ventricle: The left ventricle has hyperdynamic systolic function, with an ejection fraction of >65%. The cavity size was normal. There is mildly increased left ventricular wall thickness. Left ventricular diastolic Doppler parameters are  indeterminate. Indeterminate filling pressures There is incoordinate septal motion.  Definity contrast agent was given IV to delineate the left ventricular endocardial borders. Right Ventricle: The right ventricle has mildly reduced systolic function. The cavity was mildly enlarged. There is no increase in right ventricular wall thickness. Left Atrium: left atrial size was moderately dilated Right Atrium: right atrial size was mildly dilated. Right atrial pressure is estimated at 15 mmHg. Interatrial Septum: The interatrial septum was not well visualized. Pericardium: There is no evidence of pericardial effusion. Mitral Valve: The mitral valve is degenerative in appearance. Mild thickening of the mitral valve leaflet. Moderate calcification of the mitral valve leaflet. There is moderate to severe mitral annular calcification present. Mitral valve regurgitation is  trivial by color flow  Doppler. Moderate mitral valve stenosis. Tricuspid Valve: The tricuspid valve is grossly normal. Tricuspid valve regurgitation is mild by color flow Doppler. Aortic Valve: The aortic valve was not well visualized Aortic valve regurgitation was not visualized by color flow Doppler. There is no evidence of aortic valve stenosis. Pulmonic Valve: The pulmonic valve was not well visualized. Pulmonic valve regurgitation is not visualized by color flow Doppler. Aorta: The aortic root and ascending aorta are normal in size and structure. Venous: The inferior vena cava measures 2.60 cm, is dilated in size with less than 50% respiratory variability. Compared to previous exam: 01/29/2018: LVEF 65-70%, moderate LAE, moderate mitral stenosis.   LEFT VENTRICLE PLAX 2D LVIDd:         4.59 cm LVIDs:         3.51 cm LV PW:         1.14 cm LV IVS:        1.05 cm LVOT diam:     2.20 cm LV SV:         46 ml LV SV Index:   17.09 LVOT Area:     3.80 cm  RIGHT VENTRICLE RVSP:           52.9 mmHg  LEFT ATRIUM         Index      RIGHT ATRIUM LA diam:    3.60 cm 1.46 cm/m RA Pressure: 15 mmHg  AORTIC VALVE LVOT Vmax:   56.20 cm/s LVOT Vmean:  36.900 cm/s LVOT VTI:    0.100 m   AORTA Ao Root diam: 2.60 cm  MITRAL VALVE             TRICUSPID VALVE MV Peak grad: 20.8 mmHg  TR Peak grad:   37.9 mmHg MV Mean grad: 8.0 mmHg   TR Vmax:        308.00 cm/s MV Vmax:      2.28 m/s   RVSP:           52.9 mmHg MV Vmean:     135.0 cm/s MV VTI:       0.51 m     SHUNTS                          Systemic VTI:  0.10 m                          Systemic Diam: 2.20 cm  IVC IVC diam: 2.60 cm   Antimicrobials:  Anti-infectives (From admission, onward)   Start     Dose/Rate Route Frequency Ordered Stop   05/12/18 1100  cefTRIAXone (ROCEPHIN) 1 g in sodium chloride 0.9 % 100 mL IVPB     1 g 200 mL/hr over 30  Minutes Intravenous Every 24 hours 05/12/18 1012       Subjective: Seen and examined at bedside more  somnolent and today and was very sleepy and fatigued. States that she had a rough night.  No chest pain but still felt short of breath and was little worse today.  Had some wheezing.  No nausea or vomiting.  Would wake up and follow back to sleep very quickly.  No other concerns or complaints at this time and is still refusing her medications because they are in nectar thick liquids.  Objective: Vitals:   05/16/18 0224 05/16/18 0442 05/16/18 0905 05/16/18 1124  BP:  103/72  106/66  Pulse:  86  61  Resp:  18  20  Temp:  98.4 F (36.9 C)  98.2 F (36.8 C)  TempSrc:  Oral  Oral  SpO2:  100% 96% 100%  Weight: (!) 136.2 kg     Height:        Intake/Output Summary (Last 24 hours) at 05/16/2018 1159 Last data filed at 05/16/2018 1147 Gross per 24 hour  Intake 2908.92 ml  Output 300 ml  Net 2608.92 ml   Filed Weights   05/14/18 0445 05/15/18 0400 05/16/18 0224  Weight: 133.9 kg 133.1 kg (!) 136.2 kg   Examination: Physical Exam:  Constitutional: Well Nourished, well-developed morbidly obese Caucasian female currently appears very drowsy and was slightly difficult to arouse this morning. Eyes: Lids and conjunctive are normal.  Sclera anicteric ENMT: External ears and nose appear normal.  Grossly normal hearing.  Mucous membranes appear moist Neck: Appears supple and JVD was hard to assess given her body habitus Respiratory: Diminished to auscultation bilaterally with coarse breath sounds and wheezing.  Wearing supplemental oxygen via nasal cannula and was not tachypneic Cardiovascular: Regular rate and rhythm.  Has a 2 out of 6 systolic murmur.  Has 1-2+ lower extremity edema Abdomen: Soft, nontender, distended secondary body habitus.  Bowel sounds present GU: Deferred but has a pure wick Musculoskeletal: No contractures or cyanosis noted.  No joint deformities in upper lower extremities Skin: Dry no appreciable rashes or lesions on skin evaluation Neurologic: Cranial nerves II through  XII grossly intact no appreciable focal deficits Psychiatric: Very drowsy and somnolent today.  Would wake up and then go back to sleep.  Data Reviewed: I have personally reviewed following labs and imaging studies  CBC: Recent Labs  Lab 05/12/18 0421 05/13/18 0923 05/14/18 0337 05/15/18 0317 05/16/18 0400  WBC 7.4 9.7 8.6 7.3 6.6  NEUTROABS  --  8.0* 6.9 5.9 5.5  HGB 8.9* 9.8* 9.4* 9.3* 9.2*  HCT 30.8* 32.6* 30.7* 30.9* 32.7*  MCV 87.0 86.7 86.2 88.0 88.4  PLT 245 230 202 183 130   Basic Metabolic Panel: Recent Labs  Lab 05/10/18 1451  05/12/18 0421 05/13/18 0518 05/14/18 0337 05/15/18 0317 05/16/18 0400  NA  --    < > 137 137 138 142 144  K 3.7   < > 3.9 4.0 3.7 3.1* 3.6  CL  --    < > 92* 93* 94* 94* 98  CO2  --    < > _0 33* 33*  GLUCOSE  --    < > 86 114* 113* 101* 106*  BUN  --    < > 26* 37* 36* 28* 25*  CREATININE  --    < > 1.54* 1.71* 1.64* 1.51* 1.42*  CALCIUM  --    < > 8.6* 8.2* 8.0* 8.4* 8.6*  MG  2.0  --   --   --  2.1 2.1 2.2  PHOS  --   --   --   --  4.0 3.4 2.4*   < > = values in this interval not displayed.   GFR: Estimated Creatinine Clearance: 56.3 mL/min (A) (by C-G formula based on SCr of 1.42 mg/dL (H)). Liver Function Tests: Recent Labs  Lab 05/14/18 0337 05/15/18 0317 05/16/18 0400  AST 1,049* 731* 450*  ALT 654* 506* 362*  ALKPHOS 185* 162* 167*  BILITOT 1.6* 1.3* 1.2  PROT 6.1* 6.5 6.5  ALBUMIN 2.7* 2.8* 2.8*   Recent Labs  Lab 05/14/18 0337  LIPASE 34   No results for input(s): AMMONIA in the last 168 hours. Coagulation Profile: No results for input(s): INR, PROTIME in the last 168 hours. Cardiac Enzymes: No results for input(s): CKTOTAL, CKMB, CKMBINDEX, TROPONINI in the last 168 hours. BNP (last 3 results) No results for input(s): PROBNP in the last 8760 hours. HbA1C: No results for input(s): HGBA1C in the last 72 hours. CBG: Recent Labs  Lab 05/10/18 0621  GLUCAP 101*   Lipid Profile: No results for  input(s): CHOL, HDL, LDLCALC, TRIG, CHOLHDL, LDLDIRECT in the last 72 hours. Thyroid Function Tests: No results for input(s): TSH, T4TOTAL, FREET4, T3FREE, THYROIDAB in the last 72 hours. Anemia Panel: No results for input(s): VITAMINB12, FOLATE, FERRITIN, TIBC, IRON, RETICCTPCT in the last 72 hours. Sepsis Labs: No results for input(s): PROCALCITON, LATICACIDVEN in the last 168 hours.  Recent Results (from the past 240 hour(s))  MRSA PCR Screening     Status: None   Collection Time: 05/06/18 10:29 PM  Result Value Ref Range Status   MRSA by PCR NEGATIVE NEGATIVE Final    Comment:        The GeneXpert MRSA Assay (FDA approved for NASAL specimens only), is one component of a comprehensive MRSA colonization surveillance program. It is not intended to diagnose MRSA infection nor to guide or monitor treatment for MRSA infections. Performed at [facility], [place] [address], [place], [place] [address]   Culture, Urine     Status: Abnormal   Collection Time: 05/11/18 10:03 AM  Result Value Ref Range Status   Specimen Description URINE, RANDOM  Final   Special Requests   Final    NONE Performed at [facility], [place] [address] [place], [place] [address]    Culture >=100,000 COLONIES/mL ESCHERICHIA COLI (A)  Final   Report Status 05/13/2018 FINAL  Final   Organism ID, Bacteria ESCHERICHIA COLI (A)  Final      Susceptibility   Escherichia coli - MIC*    AMPICILLIN 16 INTERMEDIATE Intermediate     CEFAZOLIN <=4 SENSITIVE Sensitive     CEFTRIAXONE <=1 SENSITIVE Sensitive     CIPROFLOXACIN <=0.25 SENSITIVE Sensitive     GENTAMICIN <=1 SENSITIVE Sensitive     IMIPENEM <=0.25 SENSITIVE Sensitive     NITROFURANTOIN <=16 SENSITIVE Sensitive     TRIMETH/SULFA <=20 SENSITIVE Sensitive     AMPICILLIN/SULBACTAM 8 SENSITIVE Sensitive     PIP/TAZO 8 SENSITIVE Sensitive     Extended ESBL NEGATIVE Sensitive     * >=100,000 COLONIES/mL ESCHERICHIA COLI      Radiology Studies: Dg  Chest Port 1 View  Result Date: 05/16/2018 CLINICAL DATA:  Shortness of breath EXAM: PORTABLE CHEST 1 VIEW COMPARISON:  05/15/2018 FINDINGS: Cardiomegaly with mild perihilar edema. Small to moderate bilateral pleural effusions, increased. No pneumothorax. IMPRESSION: Cardiomegaly with mild perihilar edema  and small to moderate bilateral pleural effusions, increased. Electronically Signed   By: Julian Hy M.D.   On: 05/16/2018 07:41   Dg Chest Port 1 View  Result Date: 05/15/2018 CLINICAL DATA:  Shortness of breath EXAM: PORTABLE CHEST 1 VIEW COMPARISON:  May 14, 2018 FINDINGS: There is persistent cardiomegaly. Pulmonary vascularity is within normal limits. Currently there is no appreciable edema or consolidation. No adenopathy. No bone lesions. There is aortic atherosclerosis. IMPRESSION: Cardiomegaly. No edema or consolidation. Pulmonary vascularity within normal limits. Aortic Atherosclerosis (ICD10-I70.0). Electronically Signed   By: Lowella Grip III M.D.   On: 05/15/2018 07:35   Dg Chest Port 1 View  Result Date: 05/14/2018 CLINICAL DATA:  Aspiration EXAM: PORTABLE CHEST 1 VIEW COMPARISON:  May 14, 2018 study obtained earlier in the day FINDINGS: There is cardiomegaly with mild pulmonary venous hypertension. There are pleural effusions bilaterally. There is no appreciable edema or consolidation. There is aortic atherosclerosis. No adenopathy. No bone lesions. IMPRESSION: Pulmonary vascular congestion with small pleural effusions bilaterally. No frank edema or consolidation. Aortic Atherosclerosis (ICD10-I70.0). Electronically Signed   By: Lowella Grip III M.D.   On: 05/14/2018 16:00   US Abdomen Limited Ruq  Result Date: 05/14/2018 CLINICAL DATA:  Elevated LFTs. EXAM: ULTRASOUND ABDOMEN LIMITED RIGHT UPPER QUADRANT COMPARISON:  None. FINDINGS: Gallbladder: Physiologically distended. No gallstones. No significant gallbladder wall thickening. No sonographic Murphy sign noted by  sonographer. Common bile duct: Diameter: 5 mm. Liver: No focal lesion identified. Diffusely heterogeneous and slightly increased in parenchymal echogenicity. Portal vein is patent on color Doppler imaging with normal direction of blood flow towards the liver. Small amount of right upper quadrant ascites. Right pleural effusion noted. IMPRESSION: 1. Mild hepatic steatosis. 2. No gallstones.  No biliary dilatation. 3. Small volume of ascites.  Right pleural effusion. Electronically Signed   By: Keith Rake M.D.   On: 05/14/2018 22:18    Scheduled Meds:  budesonide (PULMICORT) nebulizer solution  0.25 mg Nebulization BID   buPROPion  150 mg Oral Daily   clopidogrel  75 mg Oral Daily   feeding supplement (PRO-STAT SUGAR FREE 64)  30 mL Oral BID   furosemide  80 mg Oral BID   heparin injection (subcutaneous)  5,000 Units Subcutaneous Q8H   ipratropium  0.5 mg Nebulization BID   iron polysaccharides  150 mg Oral Daily   levalbuterol  0.63 mg Nebulization BID   mouth rinse  15 mL Mouth Rinse BID   methylPREDNISolone (SOLU-MEDROL) injection  60 mg Intravenous Q12H   metoprolol succinate  12.5 mg Oral Daily   mirabegron ER  25 mg Oral Daily   multivitamin with minerals  1 tablet Oral Daily   pantoprazole  40 mg Oral Daily   phosphorus  500 mg Oral Daily   potassium chloride  20 mEq Oral BID   senna-docusate  1 tablet Oral BID   sodium chloride flush  3 mL Intravenous Q12H   venlafaxine  112.5 mg Oral Q breakfast   And   venlafaxine  37.5 mg Oral QAC supper   Continuous Infusions:  sodium chloride 250 mL (05/16/18 1150)   cefTRIAXone (ROCEPHIN)  IV 1 g (05/16/18 1151)    LOS: 10 days   Kerney Elbe, DO [facility] PAGER is on AMION  If 7PM-7AM, please contact night-coverage [email or web address] Password [email or web address] 05/16/2018, 11:59 AM

## 2018-05-16 NOTE — Progress Notes (Signed)
Patient does appear quite fatigued today; pt consistently verbalizes inability to sleep last night.

## 2018-05-16 NOTE — Progress Notes (Signed)
Progress Note  Patient Name: Shannon Carroll Date of Encounter: 05/16/2018  Primary Cardiologist: Minus Breeding, MD   Subjective   The patient denies any chest pain or SOB, laying flat comfortably.  Inpatient Medications    Scheduled Meds: . buPROPion  150 mg Oral Daily  . clopidogrel  75 mg Oral Daily  . feeding supplement (PRO-STAT SUGAR FREE 64)  30 mL Oral BID  . furosemide  40 mg Oral BID  . heparin injection (subcutaneous)  5,000 Units Subcutaneous Q8H  . ipratropium  0.5 mg Nebulization BID  . iron polysaccharides  150 mg Oral Daily  . levalbuterol  0.63 mg Nebulization BID  . mouth rinse  15 mL Mouth Rinse BID  . metoprolol succinate  12.5 mg Oral Daily  . mirabegron ER  25 mg Oral Daily  . multivitamin with minerals  1 tablet Oral Daily  . pantoprazole  40 mg Oral Daily  . phosphorus  500 mg Oral Daily  . potassium chloride  20 mEq Oral BID  . senna-docusate  1 tablet Oral BID  . sodium chloride flush  3 mL Intravenous Q12H  . venlafaxine  112.5 mg Oral Q breakfast   And  . venlafaxine  37.5 mg Oral QAC supper   Continuous Infusions: . sodium chloride Stopped (05/14/18 1949)  . cefTRIAXone (ROCEPHIN)  IV 1 g (05/15/18 1352)   PRN Meds: sodium chloride, clonazePAM, dextromethorphan-guaiFENesin, hydrALAZINE, hydrOXYzine, levalbuterol, Resource ThickenUp Clear, sodium chloride flush   Vital Signs    Vitals:   05/15/18 2019 05/16/18 0224 05/16/18 0442 05/16/18 0905  BP:   103/72   Pulse:   86   Resp:   18   Temp:   98.4 F (36.9 C)   TempSrc:   Oral   SpO2: 99%  100% 96%  Weight:  (!) 136.2 kg    Height:        Intake/Output Summary (Last 24 hours) at 05/16/2018 0927 Last data filed at 05/16/2018 0850 Gross per 24 hour  Intake 2905.92 ml  Output 350 ml  Net 2555.92 ml   Last 3 Weights 05/16/2018 05/15/2018 05/15/2018  Weight (lbs) 300 lb 4.3 oz 293 lb 6.9 oz (No Data)  Weight (kg) 136.2 kg 133.1 kg (No Data)      Telemetry    Sinus with brief  PAT- Personally Reviewed  Physical Exam   GEN: Obese, WD, NAD Neck: JVP difficult to assess Cardiac: RRR, 2/6 SM Respiratory: CTA GI: Soft, no masses palpated MS: 1+ thigh edema; lower ext wrapped Neuro:  No focal findings  Labs    Chemistry Recent Labs  Lab 05/14/18 0337 05/15/18 0317 05/16/18 0400  NA 138 142 144  K 3.7 3.1* 3.6  CL 94* 94* 98  CO2 30 33* 33*  GLUCOSE 113* 101* 106*  BUN 36* 28* 25*  CREATININE 1.64* 1.51* 1.42*  CALCIUM 8.0* 8.4* 8.6*  PROT 6.1* 6.5 6.5  ALBUMIN 2.7* 2.8* 2.8*  AST 1,049* 731* 450*  ALT 654* 506* 362*  ALKPHOS 185* 162* 167*  BILITOT 1.6* 1.3* 1.2  GFRNONAA 32* 35* 38*  GFRAA 37* 41* 44*  ANIONGAP 14 15 13      Hematology Recent Labs  Lab 05/14/18 0337 05/15/18 0317 05/16/18 0400  WBC 8.6 7.3 6.6  RBC 3.56* 3.51* 3.70*  HGB 9.4* 9.3* 9.2*  HCT 30.7* 30.9* 32.7*  MCV 86.2 88.0 88.4  MCH 26.4 26.5 24.9*  MCHC 30.6 30.1 28.1*  RDW 17.9* 17.9* 17.9*  PLT 202 183  78    Patient Profile     68 y.o. female with past medical history of mild aortic stenosis/moderate mitral stenosis, pulmonary hypertension, hypertension, diabetes mellitus, hyperlipidemia, prior stroke, chronic stage III kidney disease, obstructive sleep apnea admitted with acute on chronic diastolic congestive heart failure.  Also noted to have SVT on telemetry.  Assessment & Plan    1 acute on chronic diastolic congestive heart failure-I/O + 2.5 L, weight up after switching to PO lasix, I would increase po lasix to 80 mg po BID and start mobilizing the patient. Crea down 1.5->1.4, follow K closely. A large component of this is likely right heart failure related to pulmonary hypertension from obesity hypoventilation syndrome, sleep apnea and pulmonary venous hypertension.     2 pulmonary hypertension-likely combination of pulmonary venous hypertension related to valvular heart disease as well as obstructive sleep apnea and obesity hypoventilation syndrome.   3 supraventricular tachycardia-brief PAT on telemetry.  Continue low-dose metoprolol.  Cannot advance due to borderline blood pressure.  4 history of moderate mitral stenosis and mild aortic stenosis-we will need follow-up echoes in the future.  5 chronic stage III kidney disease-renal function unchanged today.  Will recheck tomorrow.  6 obstructive sleep apnea-needs sleep evaluation following discharge.  7 morbid obesity-needs significant weight loss.  8 elevated liver functions-some improvement this morning.  Etiology unclear.  Right upper quadrant ultrasound unrevealing.  Hepatitis serologies pending.  Passive congestion seems less likely as her LFTs were normal on March 18 and she has been diuresed since that time.  Continue off of Lipitor and Tylenol for now.  Evaluation per primary care.  9 hypokalemia-supplement.  For questions or updates, please contact [place] Please consult [email or web address] for contact info under     Signed, Ena Dawley, MD  05/16/2018, 9:27 AM

## 2018-05-16 NOTE — Progress Notes (Signed)
MD aware of ABG results, Speech aware of re-eval today- PO meds held until liquid eval completed.

## 2018-05-16 NOTE — Progress Notes (Signed)
Patient with increased heart rate following 'coughing' episode. Pt attempted to drink thin water without assistance, against advice; at which point she became choked up.    Pt reminded of why she was limited on thin liquids, and encouraged to avoid them again.   Pts sustained heart rate of 130s, for approximately 20 minutes; pt returned to SR at 82-85 BPM without intervention.   Pt denied symptoms of discomfort during her run of tachycardia.

## 2018-05-16 NOTE — Progress Notes (Signed)
  Speech Language Pathology Treatment: Dysphagia  Patient Details Name: Shannon Carroll MRN: [id number] DOB: Jan 21, 1951 Today's Date: 05/16/2018 Time: 2542-7062 SLP Time Calculation (min) (ACUTE ONLY): 38 min  Assessment / Plan / Recommendation Clinical Impression  Pt seen for follow-up for dysphagia; per RN pt refusing medications with puree or nectar liquids. SLP observed pt with lunchtime meal; pt initially refused but consumed most of items on tray once set up in front on her, including thickened liquids. No overt signs of aspiration, although pt had one delayed coughing episode near the end of meal; question esosphageal vs oropharyngeal. Pt accepted meds whole in yogurt, and tolerated quite well. Pt begging for ice water. SLP had pt perform oral care and assessed pt with regular water after meal completed. Cough x2, when using straw or taking consecutive sips. No coughing or overt signs of aspiration with smaller cup sips. Advise pt continue with regular diet, nectar thick liquids, meds whole with yogurt. May have thin water only BETWEEN meals (not with), after performing oral care. Pt in agreement. D/w RN.    HPI HPI: 68 year old female with a history of hypertension, hyperlipidemia, diet-controlled diabetes mellitus, stroke, depression, CKD stage III, diastolic CHF, aortic valve stenosis came with shortness of breath and bilateral lower extremity edema.  Patient admitted with acute on chronic diastolic CHF. On 3/26 she was noted to be coughing with PO intake, therefore SLP swallow evaluation was ordered.      SLP Plan  Continue with current plan of care       Recommendations  Diet recommendations: Regular;Nectar-thick liquid;Other(comment)(free regular water between meals after oral care) Liquids provided via: Cup;No straw Medication Administration: Whole meds with puree(pt prefers yogurt) Supervision: Patient able to self feed(assist with upright positioning and set up) Compensations:  Slow rate;Small sips/bites                Oral Care Recommendations: Oral care BID Follow up Recommendations: Skilled Nursing facility SLP Visit Diagnosis: Dysphagia, unspecified (R13.10) Plan: Continue with current plan of care       [place], [place], [place] Pathologist Acute Rehabilitation Services Pager: [phone] Office: [phone]   Aliene Altes 05/16/2018, 1:34 PM

## 2018-05-17 ENCOUNTER — Inpatient Hospital Stay (HOSPITAL_COMMUNITY): Payer: Medicare Other

## 2018-05-17 DIAGNOSIS — R0602 Shortness of breath: Secondary | ICD-10-CM

## 2018-05-17 LAB — COMPREHENSIVE METABOLIC PANEL
ALT: 275 U/L — ABNORMAL HIGH (ref 0–44)
AST: 292 U/L — ABNORMAL HIGH (ref 15–41)
Albumin: 2.7 g/dL — ABNORMAL LOW (ref 3.5–5.0)
Alkaline Phosphatase: 158 U/L — ABNORMAL HIGH (ref 38–126)
Anion gap: 9 (ref 5–15)
BILIRUBIN TOTAL: 1.4 mg/dL — AB (ref 0.3–1.2)
BUN: 24 mg/dL — ABNORMAL HIGH (ref 8–23)
CO2: 32 mmol/L (ref 22–32)
CREATININE: 1.18 mg/dL — AB (ref 0.44–1.00)
Calcium: 8.3 mg/dL — ABNORMAL LOW (ref 8.9–10.3)
Chloride: 100 mmol/L (ref 98–111)
GFR calc Af Amer: 55 mL/min — ABNORMAL LOW (ref >60)
GFR calc non Af Amer: 48 mL/min — ABNORMAL LOW (ref >60)
Glucose, Bld: 161 mg/dL — ABNORMAL HIGH (ref 70–99)
Potassium: 4 mmol/L (ref 3.5–5.1)
Sodium: 141 mmol/L (ref 135–145)
Total Protein: 6.6 g/dL (ref 6.5–8.1)

## 2018-05-17 LAB — CBC WITH DIFFERENTIAL/PLATELET
Abs Immature Granulocytes: 0.03 10*3/uL (ref 0.00–0.07)
Basophils Absolute: 0 10*3/uL (ref 0.0–0.1)
Basophils Relative: 0 %
EOS ABS: 0 10*3/uL (ref 0.0–0.5)
Eosinophils Relative: 0 %
HEMATOCRIT: 30.4 % — AB (ref 36.0–46.0)
Hemoglobin: 9 g/dL — ABNORMAL LOW (ref 12.0–15.0)
Immature Granulocytes: 1 %
Lymphocytes Relative: 9 %
Lymphs Abs: 0.3 10*3/uL — ABNORMAL LOW (ref 0.7–4.0)
MCH: 26 pg (ref 26.0–34.0)
MCHC: 29.6 g/dL — ABNORMAL LOW (ref 30.0–36.0)
MCV: 87.9 fL (ref 80.0–100.0)
Monocytes Absolute: 0.2 10*3/uL (ref 0.1–1.0)
Monocytes Relative: 5 %
Neutro Abs: 3.4 10*3/uL (ref 1.7–7.7)
Neutrophils Relative %: 85 %
Platelets: 165 10*3/uL (ref 150–400)
RBC: 3.46 MIL/uL — ABNORMAL LOW (ref 3.87–5.11)
RDW: 17.9 % — AB (ref 11.5–15.5)
WBC: 3.9 10*3/uL — ABNORMAL LOW (ref 4.0–10.5)
nRBC: 0 % (ref 0.0–0.2)

## 2018-05-17 LAB — MAGNESIUM: Magnesium: 2.1 mg/dL (ref 1.7–2.4)

## 2018-05-17 LAB — PHOSPHORUS: Phosphorus: 3.5 mg/dL (ref 2.5–4.6)

## 2018-05-17 MED ORDER — FUROSEMIDE 10 MG/ML IJ SOLN
40.0000 mg | Freq: Once | INTRAMUSCULAR | Status: AC
  Administered 2018-05-17: 40 mg via INTRAVENOUS
  Filled 2018-05-17: qty 4

## 2018-05-17 NOTE — Progress Notes (Signed)
Patient continue to refuse care pt. Stated she will not take po med, pt will not let us straighing  her up in the bed or change her unless we give her thin liquid. Constantly reminding patient why she can not have thin liquid. MD notified. Will continue to monitor patient.

## 2018-05-17 NOTE — Progress Notes (Signed)
Patient refuse BP check also reused medications because she can not have thin water, educate patient why she can not have thin water pt. Does not want the nurse in her room if she can not have thin water. Will notified MD and continue monitor the patient.

## 2018-05-17 NOTE — Progress Notes (Signed)
PROGRESS NOTE    Shannon Carroll  EUM:[id number] DOB: 18-Nov-1950 DOA: 05/06/2018 PCP: Harlan Stains, MD   Brief Narrative:  The patient is a 68 year old female with a history of hypertension, hyperlipidemia, diet-controlled diabetes mellitus, stroke, depression, CKD stage III, diastolic CHF, aortic valve stenosis and other comorbidities who presented with shortness of breath and bilateral lower extremity edema.  Patient admitted with acute on chronic diastolic CHF. She has been started on Diuresis and has improved significantly but Renal Function started worsening so Cardiology decreased Diuresis yesterday and Cr slightly improved. Hospitalization has also been complicated by Abnormal LFT's so workup has been initiated with RUQ U/S and Acute Hepatitis Panel which has been unremarkable. Also has been complicated by ?Aspiration and Dysphagia and SLP recommending Regular Diet with Nectar Thick Liquids but patient refusing them but SLP feels she needs Nectar Thick Liquids. Was more somnolent yesterday AM and states she had a rough night and was again Somnolent.  Patient continues to request water without thickening and decided to drink it last night that the thickener and ended up coughing and choking up with then a heart rate was sustaining of 130s for almost 20 minutes yesterday. She was advised to adhere to recommendations and risks and benefits of taking thickened liquids was went over with her and I told her that if she is at a higher risk for aspiration and worsening Respiratory wise.   Assessment & Plan:   Principal Problem:   Acute on chronic diastolic CHF (congestive heart failure) (HCC) Active Problems:   Hyperlipidemia   Depression   Hypertension   Stroke (HCC)   CKD (chronic kidney disease), stage III (HCC)   Normocytic anemia   Hypokalemia   Acute on chronic diastolic (congestive) heart failure (HCC)   Abnormal LFTs   Acute on chronic congestive heart failure (HCC)   SOB (shortness  of breath)   Hyperbilirubinemia  Acute Respiratory Failure with Hypoxia 2/2 to Diastolic CHF -See As below -Continuous Pulse Oximetry and Maintain O2 Saturations >90% -Continue Supplemental O2 via [place] and Wean O2 as tolerated -C/w Diuresis as below and given IV Lasix this again this AM Cardiology recommending increasing po Lasix to 80 mg po BID but patient refusing po if she cannot drink Water   -Will need Home Ambulatory Screen Prior to D/C -C/w Dextromethorphan-Guaifenesin 1 tab po BIDprn Cough -Added Xopenex/Atrovent q6h and reduced to TID; -Will Add Budesonide and Add Solumedrol IV 60 mg q12h given her wheezing and will continue today  -ABG done yesterday AM because of Somnolence and showed 7.542/54.2/125/37.3/98.8% on 2 Liters -Repeat CXR this AM showed "Cardiomegaly mild interstitial edema and small bilateral pleural effusions, grossly unchanged." -Will get OOB to Chair and Ambulate with Assistance   -Repeat CXR in AM  -Wean O2 as tolerated and will need an Ambulatory Home O2 Screen prior to D/C   Acute on Chronic Diastolic CHF -Slowly improving but slightly worsened today, patient presented with dyspnea, leg edema, BNP 1137 on admission and repeat was 1,077. -There was Vascular congestion on chest x-ray.   -2D echo in December 2019 showed grade 2 diastolic dysfunction.  -Strict I's/O's, Daily Weights, and Fluid Restriction to 1500 mL -Cardiology feels that patient has right heart failure and had restarted Lasix 80 mg every 12 hours along with Demadex but now Demadex has been discontinued and Lasix had been reduced to IV 40 mg BID; Renal Fxn worsened so it was changed to po 40 mg BID per Cardiology yesterday and now titrated  up to 80 mg BID; Will get an additional Dose of IV Lasix  40 mg again this AM  -Patient is -19 lbs from admission and is also -10.108 Liters -Continue to Monitor Volume Status Carefully  -Repeat CXR this AM showed "Cardiomegaly with mild perihilar edema. Small to  moderate bilateral pleural effusions, increased. No pneumothorax." -Repeat CXR in AM -Continue Diuresis per Cardiology and Continue to Monitor Volume Status carefully   Hyperlipidemia -Will Discontinue Atorvastatin 40 mg po daily given Abnormal LFTs -Resume in the outpatient setting   Hypertension -Blood pressure is stable and is 110/71 -Not on Amlodipine while currently hospitalized -C/w IV Diuresis per Cardiology as above -C/w Metoprolol Succinate 12.5 mg po Daily  -C/w IV Hydralazine 5 mg IV q2hprn for SBP>175  Depression/Anxiety -Continue Bupropion 150 mg po Daily and Clonazepam 0.5 mg po BIDprn Anxiety -Also will continue with Hydroxyzine 10 mg po Daily PRN -C/w Venlafaxine 112.5 mg po Daily with Breakfast and 37.5 mg po daily with Supper  History of CVA -C/w Clopidogrel 75 mg po Daily and holding Atorvastatin 40 mg po Daily   AKI on CKD stage III -Creatinine normally at baseline 1.0-1.3.  Creatinine had worsened and was 1.71 and Secondary to diuresis.  -Cardiology adjusted po Lasix and increased it to 80 mg BID and she was given an additional dose of IV Lasix 40 mg again this AM  -BUN/Cr improved to 24/1.18 -Continue to Monitor and Trend Renal Function as she is being diuresed  -Repeat CMP in AM   Hypokalemia -Improved. K+ is now 4.0 -Continue to Monitor and Replete with po KCl 20 mEQ BID while being diuresed  -Repeat CMP in AM   Normocytic Anemia -Patient's Hgb/Hct went from 8.9/30.8 -> 9.8/32.6 -> 9.4/30.7 -> 9.3/30.9 -> 9.2/32.7 -> 9.0/30.4 -Anemia panel on admission showed an iron level of 21, U IBC of 200, TIBC of 221, saturation ratios of 9%, ferritin level 77, folate level 21.7, and vitamin B12 of 686 -Continue with Niferex 150 mg po Daily  -Continue to monitor for signs and symptoms of bleeding -Repeat CMP in a.m.  ?UTI/Asymptomatic Bacteriuria -Initial U/A showed Clear color appearance, strong color urine, positive nitrites, few bacteria, and 0-5 WBCs  -This was repeated on 323 -Patient had abnormal UA and, with no Dysuria currently but ? If she did have dysuria previously -Urine culture has been ordered and showed >100,000 CFU of E Coli that was only resistant to Ampicillin  -S/p 5 Day Treatment Course with IV Ceftriaxone  SVT -Patient had episode of SVT 2 days ago, currently she is in normal sinus rhythm.  No more episodes of SVT.   -Continue Metoprolol Succinate 12.5 mg po Daily and IV Lopressor has been stopped  -C/w Telemetry  Morbid Obesity -Estimated body mass index is 45.02 kg/m as calculated from the following:   Height as of this encounter: _0  (1.727 m).   Weight as of this encounter: 134.3 kg. -Weight Loss and Dietary Counseling Given   Mitral Valve and Aortic Valve Stenosis -Per Cardiology will need Follow up ECHOes in the Future  Pulmonary HTN -Likely related to Valvular Heart Disease as well as OSA/OHS -Diuresis per Cardiology as above; Now on po as above -Breathing Treatments as above   Suspected OSA -Needs outpatient Sleep Study -Will add CPAP qHS  Increased AG; Now having Metabolic Alkalosis in the setting of Diuresis  -Patient's AG was 16 and now improved to 9; CO2 is 32 -In the setting of Diuresis  Hyperglycemia in  the setting of Diet Controlled Diabetes -Patient's CBG ranging from 86-116 -Checked HbA1c last week was 5.2 -Continue to Monitor and If blood Sugars consistently elevated then will place on Sensitive Novolog SSI AC  Abnormal LFT's, improving  -Patient's LFTs were normal on 3/18 and severely worsened; Likely from Hepatic Congestion in the setting of CHF -AST went from 29 -> 1,049 and ALT went from 18 -> 654 -Now AST is 292 and ALT is 275 -? If Patient has NASH given her Body habitus  -Discontinued Atorvastatin and Acetaminophen and Not complaining of any Abdominal Pain -Is on Heparin 5,000 units sq q8h and was changed yesterday from Enoxaparin -Checked GGT and was 130 (in the setting of  CHF) and Lipase Level (34)  -Checked RUQ U/S and showed Mild hepatic steatosis.No gallstones. No biliary dilatation. Small volume of ascites.  Right pleural effusion -Acute Hepatitis Panel Negative  -Continue to Monitor and Trend Hepatic Fxn -Repeat CMP  Hyperbilirubinemia  -Patient's T Bili was 1.6 and improved slightly to 1.4 -Checked RUQ U/S and Acute Hepatitis Panel as above -Continue to Monitor and Repeat CMP in AM   ?Aspiration/Dysphagia -Had some choking and coughing yesterday  -Made NPO and SLP evaluated -SLP recommending Regular Diet with Nectar Thick Liquids but patient has been refusing Nectar Thick Liquids; Will have SLP re-evaluate and still recommending [place] -A very lengthy discussion with the patient about adhering to the recommendations recommended by the speech therapist as she is at high risk for aspiration.  Risks and benefits were discussed with the patient and patient does not want nectar thick liquids and states that she will still drink water normally.  Yesterday she had an episode which he drank regular water and had a choking episode and started coughing and heart rate went to the 130s.  She will continue to drink water the way she wants despite my medical recommendations. -Added Xopenex/Atrovent for breathing -Continue to Monitor closely  -Repeat Chest x-ray in the a.m.  Intermittent Confusion/Delirum -In the setting of Hypoxia -C/w Supplemental O2 via [place] -Will place on Delirium Precautions -Need to closely Monitor while she is on Wellbutrin XL and Venlafaxine  Somnolence, improving  -Checked ABG and as above -Had worsening respiratory Symptoms with Wheezing today ad likely from Pulmonary Edema and still wheezing and somnolent this AM but easily arousable -Will order CPAP -States she had a rough night and did not sleep well -Continue to Monitor Closely   Hypophosphatemia -Patient's Phos Level this AM was 2.4 and repeat wa 3.5 -Continue to  Monitor and Replete as Necessary -Repeat Phos Level in the AM  -Repeat  Leukopenia -Mild and WBC went from 9.7 is now 3.9 -Continue to monitor as patient is on IV steroids and suspecting aspiration -Repeat CBC in AM   DVT prophylaxis: Enoxaparin 40 mg sq q24h to be D/C'd given worsening Renal Fxn and was started on Heparin 5,000 units sq q8h Code Status: FULL CODE Family Communication: No family present at bedside  Disposition Plan: SNF in the next 24-48 hours   Consultants:   Cardiology   Procedures:  ECHOCARDIOGRAM 05/07/2018 IMPRESSIONS    1. The left ventricle has hyperdynamic systolic function, with an ejection fraction of >65%. The cavity size was normal. There is mildly increased left ventricular wall thickness. Left ventricular diastolic Doppler parameters are indeterminate.  Indeterminate filling pressures There is incoordinate septal motion.  2. The right ventricle has mildly reduced systolic function. The cavity was mildly enlarged. There is no increase in  right ventricular wall thickness.  3. Left atrial size was moderately dilated.  4. Right atrial size was mildly dilated.  5. The mitral valve is degenerative. Mild thickening of the mitral valve leaflet. Moderate calcification of the mitral valve leaflet. There is moderate to severe mitral annular calcification present. Moderate mitral valve stenosis.  6. The tricuspid valve is grossly normal.  7. The aortic valve was not well visualized.  8. The aortic root and ascending aorta are normal in size and structure.  9. The inferior vena cava was dilated in size with <50% respiratory variability. 10. The interatrial septum was not well visualized. 11. When compared to the prior study: 01/29/2018: LVEF 65-70%, moderate LAE, moderate mitral stenosis.  SUMMARY   LVEF 65-70%, incoordinate septal motion, mild LVH, mildly reduced RV systolic function, moderate LAE, mild RA, heavy MAC with moderate mitral stenosis and  trivial MR, mild TR, RVSP 53 mmHg, dilated IVC  FINDINGS  Left Ventricle: The left ventricle has hyperdynamic systolic function, with an ejection fraction of >65%. The cavity size was normal. There is mildly increased left ventricular wall thickness. Left ventricular diastolic Doppler parameters are  indeterminate. Indeterminate filling pressures There is incoordinate septal motion. Definity contrast agent was given IV to delineate the left ventricular endocardial borders. Right Ventricle: The right ventricle has mildly reduced systolic function. The cavity was mildly enlarged. There is no increase in right ventricular wall thickness. Left Atrium: left atrial size was moderately dilated Right Atrium: right atrial size was mildly dilated. Right atrial pressure is estimated at 15 mmHg. Interatrial Septum: The interatrial septum was not well visualized. Pericardium: There is no evidence of pericardial effusion. Mitral Valve: The mitral valve is degenerative in appearance. Mild thickening of the mitral valve leaflet. Moderate calcification of the mitral valve leaflet. There is moderate to severe mitral annular calcification present. Mitral valve regurgitation is  trivial by color flow Doppler. Moderate mitral valve stenosis. Tricuspid Valve: The tricuspid valve is grossly normal. Tricuspid valve regurgitation is mild by color flow Doppler. Aortic Valve: The aortic valve was not well visualized Aortic valve regurgitation was not visualized by color flow Doppler. There is no evidence of aortic valve stenosis. Pulmonic Valve: The pulmonic valve was not well visualized. Pulmonic valve regurgitation is not visualized by color flow Doppler. Aorta: The aortic root and ascending aorta are normal in size and structure. Venous: The inferior vena cava measures 2.60 cm, is dilated in size with less than 50% respiratory variability. Compared to previous exam: 01/29/2018: LVEF 65-70%, moderate LAE, moderate mitral  stenosis.   LEFT VENTRICLE PLAX 2D LVIDd:         4.59 cm LVIDs:         3.51 cm LV PW:         1.14 cm LV IVS:        1.05 cm LVOT diam:     2.20 cm LV SV:         46 ml LV SV Index:   17.09 LVOT Area:     3.80 cm  RIGHT VENTRICLE RVSP:           52.9 mmHg  LEFT ATRIUM         Index      RIGHT ATRIUM LA diam:    3.60 cm 1.46 cm/m RA Pressure: 15 mmHg  AORTIC VALVE LVOT Vmax:   56.20 cm/s LVOT Vmean:  36.900 cm/s LVOT VTI:    0.100 m   AORTA Ao Root diam: 2.60 cm  MITRAL VALVE             TRICUSPID VALVE MV Peak grad: 20.8 mmHg  TR Peak grad:   37.9 mmHg MV Mean grad: 8.0 mmHg   TR Vmax:        308.00 cm/s MV Vmax:      2.28 m/s   RVSP:           52.9 mmHg MV Vmean:     135.0 cm/s MV VTI:       0.51 m     SHUNTS                          Systemic VTI:  0.10 m                          Systemic Diam: 2.20 cm  IVC IVC diam: 2.60 cm   Antimicrobials:  Anti-infectives (From admission, onward)   Start     Dose/Rate Route Frequency Ordered Stop   05/12/18 1100  cefTRIAXone (ROCEPHIN) 1 g in sodium chloride 0.9 % 100 mL IVPB  Status:  Discontinued     1 g 200 mL/hr over 30 Minutes Intravenous Every 24 hours 05/12/18 1012 05/16/18 1210     Subjective: Seen and examined at bedside this AM ~somnolent this morning and I had to arouse her from her sleep.  Is easier for me to arouse her today than it was yesterday.  No nausea or vomiting.  Still does not want to drink thickened (nectar thick).  Denies any lightheadedness or dizziness.  States shortness of breath is stable.  No other concerns or complaints at this time and states that she has not been out of bed but nursing has been getting her up daily.   Objective: Vitals:   05/16/18 2151 05/17/18 0402 05/17/18 0757 05/17/18 0948  BP:  105/71  110/71  Pulse:  73  71  Resp:  18    Temp:  97.8 F (36.6 C)  97.7 F (36.5 C)  TempSrc:  Oral  Oral  SpO2: 98% 100% 98% 100%  Weight:  134.3 kg    Height:         Intake/Output Summary (Last 24 hours) at 05/17/2018 1147 Last data filed at 05/17/2018 0600 Gross per 24 hour  Intake 300 ml  Output 1200 ml  Net -900 ml   Filed Weights   05/15/18 0400 05/16/18 0224 05/17/18 0402  Weight: 133.1 kg (!) 136.2 kg 134.3 kg   Examination: Physical Exam:  Constitutional: Well-nourished, well-developed morbidly obese Caucasian female who is currently sleeping awoken from her sleep and still appears a little somnolent and drowsy but once fully awake she is alert and oriented Eyes: Lids and conjunctive normal.  Sclera anicteric ENMT: External Ears and Nose appear normal. Sclerae ainciteric Neck: Appears supple and JVD was hard to assess given her body habitus Respiratory: Diminished to auscultation bilaterally with coarse breath sounds and mild wheezing. Wearing supplemental O2 via [place] and was tachyneic   Cardiovascular: Regular rate and rhythm.  Has a 2 out of 6 systolic murmur.  Has 1+ lower extremity edema and is improved from yesterday Abdomen: Soft, nontender, distended secondary body habitus.  Bowel sounds present GU: Deferred Musculoskeletal: No contractures or cyanosis noted.  No joint deformities in the upper and lower extremities Skin: Appears warm and dry with no appreciable rashes or lesions on limited skin evaluation Neurologic: Cranial nerves II through XII  gross intact no appreciable focal deficits Psychiatric: Again appeared drowsy and somnolent but was easily arousable.  She woke up and she was awake and alert.  Slightly agitated as she wants to drink regular water  Data Reviewed: I have personally reviewed following labs and imaging studies  CBC: Recent Labs  Lab 05/13/18 0923 05/14/18 0337 05/15/18 0317 05/16/18 0400 05/17/18 0307  WBC 9.7 8.6 7.3 6.6 3.9*  NEUTROABS 8.0* 6.9 5.9 5.5 3.4  HGB 9.8* 9.4* 9.3* 9.2* 9.0*  HCT 32.6* 30.7* 30.9* 32.7* 30.4*  MCV 86.7 86.2 88.0 88.4 87.9  PLT 230 202 183 185 742   Basic Metabolic Panel:  Recent Labs  Lab 05/10/18 1451  05/13/18 0518 05/14/18 0337 05/15/18 0317 05/16/18 0400 05/17/18 0307  NA  --    < > 137 138 142 144 141  K 3.7   < > 4.0 3.7 3.1* 3.6 4.0  CL  --    < > 93* 94* 94* 98 100  CO2  --    < > 28 30 33* 33* 32  GLUCOSE  --    < > 114* 113* 101* 106* 161*  BUN  --    < > 37* 36* 28* 25* 24*  CREATININE  --    < > 1.71* 1.64* 1.51* 1.42* 1.18*  CALCIUM  --    < > 8.2* 8.0* 8.4* 8.6* 8.3*  MG 2.0  --   --  2.1 2.1 2.2 2.1  PHOS  --   --   --  4.0 3.4 2.4* 3.5   < > = values in this interval not displayed.   GFR: Estimated Creatinine Clearance: 67.3 mL/min (A) (by C-G formula based on SCr of 1.18 mg/dL (H)). Liver Function Tests: Recent Labs  Lab 05/14/18 0337 05/15/18 0317 05/16/18 0400 05/17/18 0307  AST 1,049* 731* 450* 292*  ALT 654* 506* 362* 275*  ALKPHOS 185* 162* 167* 158*  BILITOT 1.6* 1.3* 1.2 1.4*  PROT 6.1* 6.5 6.5 6.6  ALBUMIN 2.7* 2.8* 2.8* 2.7*   Recent Labs  Lab 05/14/18 0337  LIPASE 34   No results for input(s): AMMONIA in the last 168 hours. Coagulation Profile: No results for input(s): INR, PROTIME in the last 168 hours. Cardiac Enzymes: No results for input(s): CKTOTAL, CKMB, CKMBINDEX, TROPONINI in the last 168 hours. BNP (last 3 results) No results for input(s): PROBNP in the last 8760 hours. HbA1C: No results for input(s): HGBA1C in the last 72 hours. CBG: No results for input(s): GLUCAP in the last 168 hours. Lipid Profile: No results for input(s): CHOL, HDL, LDLCALC, TRIG, CHOLHDL, LDLDIRECT in the last 72 hours. Thyroid Function Tests: No results for input(s): TSH, T4TOTAL, FREET4, T3FREE, THYROIDAB in the last 72 hours. Anemia Panel: No results for input(s): VITAMINB12, FOLATE, FERRITIN, TIBC, IRON, RETICCTPCT in the last 72 hours. Sepsis Labs: No results for input(s): PROCALCITON, LATICACIDVEN in the last 168 hours.  Recent Results (from the past 240 hour(s))  Culture, Urine     Status: Abnormal    Collection Time: 05/11/18 10:03 AM  Result Value Ref Range Status   Specimen Description URINE, RANDOM  Final   Special Requests   Final    NONE Performed at [facility], [address] [place], [place] [address]    Culture >=100,000 COLONIES/mL ESCHERICHIA COLI (A)  Final   Report Status 05/13/2018 FINAL  Final   Organism ID, Bacteria ESCHERICHIA COLI (A)  Final      Susceptibility   Escherichia  coli - MIC*    AMPICILLIN 16 INTERMEDIATE Intermediate     CEFAZOLIN <=4 SENSITIVE Sensitive     CEFTRIAXONE <=1 SENSITIVE Sensitive     CIPROFLOXACIN <=0.25 SENSITIVE Sensitive     GENTAMICIN <=1 SENSITIVE Sensitive     IMIPENEM <=0.25 SENSITIVE Sensitive     NITROFURANTOIN <=16 SENSITIVE Sensitive     TRIMETH/SULFA <=20 SENSITIVE Sensitive     AMPICILLIN/SULBACTAM 8 SENSITIVE Sensitive     PIP/TAZO 8 SENSITIVE Sensitive     Extended ESBL NEGATIVE Sensitive     * >=100,000 COLONIES/mL ESCHERICHIA COLI      Radiology Studies: Dg Chest Port 1 View  Result Date: 05/17/2018 CLINICAL DATA:  Shortness of breath EXAM: PORTABLE CHEST 1 VIEW COMPARISON:  05/16/2018 FINDINGS: Cardiomegaly with mild perihilar edema and small bilateral pleural effusions, grossly unchanged. No pneumothorax. IMPRESSION: Cardiomegaly mild interstitial edema and small bilateral pleural effusions, grossly unchanged. Electronically Signed   By: Julian Hy M.D.   On: 05/17/2018 08:11   Dg Chest Port 1 View  Result Date: 05/16/2018 CLINICAL DATA:  Shortness of breath EXAM: PORTABLE CHEST 1 VIEW COMPARISON:  05/15/2018 FINDINGS: Cardiomegaly with mild perihilar edema. Small to moderate bilateral pleural effusions, increased. No pneumothorax. IMPRESSION: Cardiomegaly with mild perihilar edema and small to moderate bilateral pleural effusions, increased. Electronically Signed   By: Julian Hy M.D.   On: 05/16/2018 07:41    Scheduled Meds: . budesonide (PULMICORT) nebulizer solution  0.25 mg Nebulization  BID  . buPROPion  150 mg Oral Daily  . clopidogrel  75 mg Oral Daily  . feeding supplement (PRO-STAT SUGAR FREE 64)  30 mL Oral BID  . furosemide  80 mg Oral BID  . heparin injection (subcutaneous)  5,000 Units Subcutaneous Q8H  . ipratropium  0.5 mg Nebulization BID  . iron polysaccharides  150 mg Oral Daily  . levalbuterol  0.63 mg Nebulization BID  . mouth rinse  15 mL Mouth Rinse BID  . methylPREDNISolone (SOLU-MEDROL) injection  60 mg Intravenous Q12H  . metoprolol succinate  12.5 mg Oral Daily  . mirabegron ER  25 mg Oral Daily  . multivitamin with minerals  1 tablet Oral Daily  . pantoprazole  40 mg Oral Daily  . potassium chloride  20 mEq Oral BID  . senna-docusate  1 tablet Oral BID  . sodium chloride flush  3 mL Intravenous Q12H  . venlafaxine  112.5 mg Oral Q breakfast   And  . venlafaxine  37.5 mg Oral QAC supper   Continuous Infusions: . sodium chloride Stopped (05/16/18 1219)    LOS: 11 days   Kerney Elbe, DO [facility] PAGER is on AMION  If 7PM-7AM, please contact night-coverage [email or web address] Password [facility] 05/17/2018, 11:47 AM

## 2018-05-17 NOTE — Progress Notes (Signed)
Progress Note  Patient Name: Shannon Carroll Date of Encounter: 05/17/2018  Primary Cardiologist: Minus Breeding, MD   Subjective   The patient seems comfortable, laying flat.  Inpatient Medications    Scheduled Meds: . budesonide (PULMICORT) nebulizer solution  0.25 mg Nebulization BID  . buPROPion  150 mg Oral Daily  . clopidogrel  75 mg Oral Daily  . feeding supplement (PRO-STAT SUGAR FREE 64)  30 mL Oral BID  . furosemide  80 mg Oral BID  . heparin injection (subcutaneous)  5,000 Units Subcutaneous Q8H  . ipratropium  0.5 mg Nebulization BID  . iron polysaccharides  150 mg Oral Daily  . levalbuterol  0.63 mg Nebulization BID  . mouth rinse  15 mL Mouth Rinse BID  . methylPREDNISolone (SOLU-MEDROL) injection  60 mg Intravenous Q12H  . metoprolol succinate  12.5 mg Oral Daily  . mirabegron ER  25 mg Oral Daily  . multivitamin with minerals  1 tablet Oral Daily  . pantoprazole  40 mg Oral Daily  . phosphorus  500 mg Oral Daily  . potassium chloride  20 mEq Oral BID  . senna-docusate  1 tablet Oral BID  . sodium chloride flush  3 mL Intravenous Q12H  . venlafaxine  112.5 mg Oral Q breakfast   And  . venlafaxine  37.5 mg Oral QAC supper   Continuous Infusions: . sodium chloride Stopped (05/16/18 1219)   PRN Meds: sodium chloride, clonazePAM, dextromethorphan-guaiFENesin, hydrALAZINE, hydrOXYzine, levalbuterol, Resource ThickenUp Clear, sodium chloride flush   Vital Signs    Vitals:   05/16/18 1940 05/16/18 2151 05/17/18 0402 05/17/18 0757  BP: 109/82  105/71   Pulse: 78  73   Resp: 18  18   Temp: 98.2 F (36.8 C)  97.8 F (36.6 C)   TempSrc: Oral  Oral   SpO2: 99% 98% 100% 98%  Weight:   134.3 kg   Height:        Intake/Output Summary (Last 24 hours) at 05/17/2018 0912 Last data filed at 05/17/2018 0600 Gross per 24 hour  Intake 303 ml  Output 1200 ml  Net -897 ml   Last 3 Weights 05/17/2018 05/16/2018 05/15/2018  Weight (lbs) 296 lb 1.2 oz 300 lb 4.3 oz  293 lb 6.9 oz  Weight (kg) 134.3 kg 136.2 kg 133.1 kg      Telemetry    Sinus rhythm- Personally Reviewed  Physical Exam   GEN: Obese, WD, NAD Neck: JVP difficult to assess Cardiac: RRR, 2/6 SM Respiratory: CTA GI: Soft, no masses palpated MS: 1+ thigh edema; lower ext wrapped Neuro:  No focal findings  Labs    Chemistry Recent Labs  Lab 05/15/18 0317 05/16/18 0400 05/17/18 0307  NA 142 144 141  K 3.1* 3.6 4.0  CL 94* 98 100  CO2 33* 33* 32  GLUCOSE 101* 106* 161*  BUN 28* 25* 24*  CREATININE 1.51* 1.42* 1.18*  CALCIUM 8.4* 8.6* 8.3*  PROT 6.5 6.5 6.6  ALBUMIN 2.8* 2.8* 2.7*  AST 731* 450* 292*  ALT 506* 362* 275*  ALKPHOS 162* 167* 158*  BILITOT 1.3* 1.2 1.4*  GFRNONAA 35* 38* 48*  GFRAA 41* 44* 55*  ANIONGAP 15 13 9      Hematology Recent Labs  Lab 05/15/18 0317 05/16/18 0400 05/17/18 0307  WBC 7.3 6.6 3.9*  RBC 3.51* 3.70* 3.46*  HGB 9.3* 9.2* 9.0*  HCT 30.9* 32.7* 30.4*  MCV 88.0 88.4 87.9  MCH 26.5 24.9* 26.0  MCHC 30.1 28.1* 29.6*  RDW 17.9* 17.9* 17.9*  PLT 183 185 165    Patient Profile     68 y.o. female with past medical history of mild aortic stenosis/moderate mitral stenosis, pulmonary hypertension, hypertension, diabetes mellitus, hyperlipidemia, prior stroke, chronic stage III kidney disease, obstructive sleep apnea admitted with acute on chronic diastolic congestive heart failure.  Also noted to have SVT on telemetry.  Assessment & Plan    1 acute on chronic diastolic congestive heart failure-I/O + 2.5 L, weight up after switching to PO lasix, I would increase po lasix to 80 mg po BID and start mobilizing the patient. Crea down 1.5->1.4->1.2. A large component of this is likely right heart failure related to pulmonary hypertension from obesity hypoventilation syndrome, sleep apnea and pulmonary venous hypertension.     2 pulmonary hypertension-likely combination of pulmonary venous hypertension related to valvular heart disease as  well as obstructive sleep apnea and obesity hypoventilation syndrome.  3 supraventricular tachycardia-brief PAT on telemetry.  Continue low-dose metoprolol.  Cannot advance due to borderline blood pressure.  4 history of moderate mitral stenosis and mild aortic stenosis-we will need follow-up echoes in the future.  5 chronic stage III kidney disease-renal function unchanged today.  Will recheck tomorrow.  6 obstructive sleep apnea-needs sleep evaluation following discharge.  7 morbid obesity-needs significant weight loss.  8 elevated liver functions-some improvement this morning.  Etiology unclear.  Right upper quadrant ultrasound unrevealing.  Hepatitis serologies pending.  Passive congestion seems less likely as her LFTs were normal on March 18 and she has been diuresed since that time.  Continue off of Lipitor and Tylenol for now.  Evaluation per primary care.  [facility] will sign off.   Medication Recommendations: medications as above Other recommendations (labs, testing, etc):  No further testing, the patient can be transferred to SNF.  Follow up as an outpatient:  As needed   For questions or updates, please contact [place] Please consult [email or web address] for contact info under     Signed, Ena Dawley, MD  05/17/2018, 9:12 AM

## 2018-05-18 ENCOUNTER — Inpatient Hospital Stay (HOSPITAL_COMMUNITY): Payer: Medicare Other

## 2018-05-18 LAB — CBC WITH DIFFERENTIAL/PLATELET
ABS IMMATURE GRANULOCYTES: 0.03 10*3/uL (ref 0.00–0.07)
Basophils Absolute: 0 10*3/uL (ref 0.0–0.1)
Basophils Relative: 0 %
Eosinophils Absolute: 0 10*3/uL (ref 0.0–0.5)
Eosinophils Relative: 0 %
HCT: 31.7 % — ABNORMAL LOW (ref 36.0–46.0)
Hemoglobin: 9.3 g/dL — ABNORMAL LOW (ref 12.0–15.0)
Immature Granulocytes: 0 %
LYMPHS PCT: 6 %
Lymphs Abs: 0.4 10*3/uL — ABNORMAL LOW (ref 0.7–4.0)
MCH: 26 pg (ref 26.0–34.0)
MCHC: 29.3 g/dL — ABNORMAL LOW (ref 30.0–36.0)
MCV: 88.5 fL (ref 80.0–100.0)
MONO ABS: 0.3 10*3/uL (ref 0.1–1.0)
Monocytes Relative: 4 %
Neutro Abs: 6.2 10*3/uL (ref 1.7–7.7)
Neutrophils Relative %: 90 %
PLATELETS: 184 10*3/uL (ref 150–400)
RBC: 3.58 MIL/uL — ABNORMAL LOW (ref 3.87–5.11)
RDW: 17.7 % — ABNORMAL HIGH (ref 11.5–15.5)
WBC: 6.9 10*3/uL (ref 4.0–10.5)
nRBC: 0 % (ref 0.0–0.2)

## 2018-05-18 LAB — COMPREHENSIVE METABOLIC PANEL
ALT: 214 U/L — ABNORMAL HIGH (ref 0–44)
AST: 192 U/L — ABNORMAL HIGH (ref 15–41)
Albumin: 2.8 g/dL — ABNORMAL LOW (ref 3.5–5.0)
Alkaline Phosphatase: 139 U/L — ABNORMAL HIGH (ref 38–126)
Anion gap: 12 (ref 5–15)
BUN: 30 mg/dL — ABNORMAL HIGH (ref 8–23)
CO2: 33 mmol/L — ABNORMAL HIGH (ref 22–32)
Calcium: 8.7 mg/dL — ABNORMAL LOW (ref 8.9–10.3)
Chloride: 98 mmol/L (ref 98–111)
Creatinine, Ser: 1.18 mg/dL — ABNORMAL HIGH (ref 0.44–1.00)
GFR calc Af Amer: 55 mL/min — ABNORMAL LOW (ref >60)
GFR calc non Af Amer: 48 mL/min — ABNORMAL LOW (ref >60)
Glucose, Bld: 157 mg/dL — ABNORMAL HIGH (ref 70–99)
Potassium: 4.1 mmol/L (ref 3.5–5.1)
Sodium: 143 mmol/L (ref 135–145)
Total Bilirubin: 1.2 mg/dL (ref 0.3–1.2)
Total Protein: 6.8 g/dL (ref 6.5–8.1)

## 2018-05-18 LAB — PHOSPHORUS: Phosphorus: 4.1 mg/dL (ref 2.5–4.6)

## 2018-05-18 LAB — MAGNESIUM: Magnesium: 2.2 mg/dL (ref 1.7–2.4)

## 2018-05-18 MED ORDER — FUROSEMIDE 10 MG/ML IJ SOLN
40.0000 mg | Freq: Once | INTRAMUSCULAR | Status: AC
  Administered 2018-05-18: 40 mg via INTRAVENOUS
  Filled 2018-05-18: qty 4

## 2018-05-18 NOTE — Consult Note (Addendum)
[place] Nurse wound consult note Patient receiving care in [place].  I spoke with the patient's primary RN, Velia, via telephone.  I walked her through the existing orders for wound care and unna boot as these orders remain active.  She is now aware of how the unna boot change process occurs.  Therefore, I am cancelling this consult order. Monitor the wound area(s) for worsening of condition such as: Signs/symptoms of infection,  Increase in size,  Development of or worsening of odor, Development of pain, or increased pain at the affected locations.  Notify the medical team if any of these develop.  Thank you for the consult.  Discussed plan of care with the bedside nurse.  [place] nurse will not follow at this time.  Please re-consult the [place] team if needed.  Val Riles, RN, MSN, CWOCN, CNS-BC, pager [phone]

## 2018-05-18 NOTE — Progress Notes (Signed)
Physical Therapy Treatment Patient Details Name: Shannon Carroll MRN: [id number] DOB: [birth year] Today's Date: 05/18/2018    History of Present Illness 68 year old female with a history of hypertension, hyperlipidemia, diet-controlled diabetes mellitus, stroke, depression, CKD stage III, diastolic CHF, aortic valve stenosis came with shortness of breath and bilateral lower extremity edema.  Patient admitted with acute on chronic diastolic CHF.    PT Comments    Patient received in bed, on bed pan and wishing to get up to [facility] instead of on bed pan. Required MaxA for rolling, then MaxAx2 for supine to sit and adjusting at EOB. Able to perform first sit to stand from bed with maxAx2 in stedy, performed totalA stand-pivot transfer in stedy to [facility], then performed multiple sit to stands from [facility] with ModAx2 in stedy and totalA pericare. Finished session with totalA stand-pivot transfer in stedy to recliner and was left up in recliner with all needs met, RN aware of patient status.    Follow Up Recommendations  SNF;Supervision for mobility/OOB     Equipment Recommendations  None recommended by PT    Recommendations for Other Services       Precautions / Restrictions Precautions Precautions: Fall Restrictions Weight Bearing Restrictions: No    Mobility  Bed Mobility Overal bed mobility: Needs Assistance Bed Mobility: Rolling;Supine to Sit Rolling: Max assist   Supine to sit: +2 for physical assistance;Max assist;HOB elevated     General bed mobility comments: cues to sequence, assist for all aspects  Transfers Overall transfer level: Needs assistance Equipment used: Rolling walker (2 wheeled) Transfers: Sit to/from Stand Sit to Stand: +2 physical assistance;Max assist;Mod assist         General transfer comment: performed x 1 from bed with +2 max assist, x 3 from [facility] with +2 mod assist and use of stedy  Ambulation/Gait             General Gait Details:  unable   Stairs             Wheelchair Mobility    Modified Rankin (Stroke Patients Only)       Balance Overall balance assessment: Needs assistance Sitting-balance support: Bilateral upper extremity supported;Feet supported Sitting balance-Leahy Scale: Poor Sitting balance - Comments: significan posterior lean requiring max assist unless holding cross bar of stedy Postural control: Posterior lean Standing balance support: Bilateral upper extremity supported Standing balance-Leahy Scale: Poor Standing balance comment: requires physical assist and use of Stedy                            Cognition Arousal/Alertness: Awake/alert Behavior During Therapy: Flat affect Overall Cognitive Status: Impaired/Different from baseline Area of Impairment: Orientation;Attention;Memory;Following commands;Safety/judgement;Awareness;Problem solving                 Orientation Level: Disoriented to;Time;Situation Current Attention Level: Sustained Memory: Decreased short-term memory Following Commands: Follows one step commands with increased time;Follows one step commands inconsistently Safety/Judgement: Decreased awareness of safety;Decreased awareness of deficits Awareness: Intellectual Problem Solving: Slow processing;Decreased initiation;Difficulty sequencing;Requires verbal cues;Requires tactile cues General Comments: pt attempting to answer phone with call button, refusing thickened water despite education in why it is necessary      Exercises      General Comments        Pertinent Vitals/Pain Pain Assessment: Faces Pain Score: 0-No pain Faces Pain Scale: No hurt Pain Intervention(s): Monitored during session    Home Living  Prior Function            PT Goals (current goals can now be found in the care plan section) Acute Rehab PT Goals Patient Stated Goal: to drink unthickened water PT Goal Formulation: With  patient Time For Goal Achievement: 05/23/18 Potential to Achieve Goals: Fair Progress towards PT goals: Progressing toward goals    Frequency    Min 2X/week      PT Plan Current plan remains appropriate    Co-evaluation PT/OT/SLP Co-Evaluation/Treatment: Yes Reason for Co-Treatment: For patient/therapist safety;To address functional/ADL transfers;Necessary to address cognition/behavior during functional activity PT goals addressed during session: Mobility/safety with mobility;Balance OT goals addressed during session: ADL's and self-care      AM-PAC PT "6 Clicks" Mobility   Outcome Measure  Help needed turning from your back to your side while in a flat bed without using bedrails?: A Lot Help needed moving from lying on your back to sitting on the side of a flat bed without using bedrails?: A Lot Help needed moving to and from a bed to a chair (including a wheelchair)?: Total Help needed standing up from a chair using your arms (e.g., wheelchair or bedside chair)?: Total Help needed to walk in hospital room?: Total Help needed climbing 3-5 steps with a railing? : Total 6 Click Score: 8    End of Session Equipment Utilized During Treatment: Oxygen;Gait belt Activity Tolerance: Patient limited by fatigue Patient left: with call bell/phone within reach;in chair Nurse Communication: Mobility status;Need for lift equipment(RN aware to use stedy for back to bed ) PT Visit Diagnosis: Muscle weakness (generalized) (M62.81);Difficulty in walking, not elsewhere classified (R26.2)     Time: 3779-3968 PT Time Calculation (min) (ACUTE ONLY): 41 min  Charges:  $Therapeutic Activity: 23-37 mins                     Deniece Ree PT, DPT, CBIS  Supplemental Physical Therapist [place]    Pager [phone] Acute Rehab Office [phone]

## 2018-05-18 NOTE — Progress Notes (Signed)
Orthopedic Tech Progress Note Patient Details:  Shannon Carroll 1950-02-19 [id number]  Ortho Devices Type of Ortho Device: Haematologist Ortho Device/Splint Location: bi-lateral Ortho Device/Splint Interventions: Ordered, Application, Adjustment   Post Interventions Patient Tolerated: Well Instructions Provided: Care of device, Adjustment of device   Karolee Stamps 05/18/2018, 9:21 PM

## 2018-05-18 NOTE — Progress Notes (Signed)
Patient called for use of bed pan, but patient refused help from nurse tech because pt stated: "I don't find you funny" to nurse tech.  Speech therapist and I present while helping patient to bed pan.  Patient refusing more help at this time.

## 2018-05-18 NOTE — Progress Notes (Signed)
Patient back to bed and pure wick back on.

## 2018-05-18 NOTE — Progress Notes (Signed)
SLP Cancellation Note  Patient Details Name: Shannon Carroll MRN: [id number] DOB: 20-Feb-1950   Cancelled treatment:       Reason Eval/Treat Not Completed: Patient declined, no reason specified. Pt was initially agreeable to PO trials but then said she urgently needed to go to the bathroom. She is easily distracted and becomes easily frustrated with staff. Unable to get her to consume any POs at this time. Will f/u as able - discussed pt with MD as she is eager to accept the risk of aspiration and drink thin liquids but I question her understanding of this.   Venita Sheffield Cherlynn Popiel 05/18/2018, 10:07 AM  [name], M.A. [place] Acute Environmental education officer [phone] Office [phone]

## 2018-05-18 NOTE — Plan of Care (Signed)
  Problem: Coping: Goal: Level of anxiety will decrease Outcome: Progressing   Problem: Pain Managment: Goal: General experience of comfort will improve Outcome: Progressing   Problem: Safety: Goal: Ability to remain free from injury will improve Outcome: Progressing   Problem: Skin Integrity: Goal: Risk for impaired skin integrity will decrease Outcome: Progressing   

## 2018-05-18 NOTE — Progress Notes (Signed)
Patient is currently sitting in her chair.  Patient refuses to have her Pure Elza Rafter put back and had an unmeasured output occurrence in her chair. Patient is refusing her thickening nectar drink and is requesting to have regular water.

## 2018-05-18 NOTE — Progress Notes (Signed)
Occupational Therapy Treatment Patient Details Name: Shannon Carroll MRN: [id number] DOB: Feb 05, 1951 Today's Date: 05/18/2018    History of present illness 68 year old female with a history of hypertension, hyperlipidemia, diet-controlled diabetes mellitus, stroke, depression, CKD stage III, diastolic CHF, aortic valve stenosis came with shortness of breath and bilateral lower extremity edema.  Patient admitted with acute on chronic diastolic CHF.   OT comments  Pt needing to have BM. Rolled with max assist to remove bedpan and used stedy with +2 assist to transfer pt to [employer]. Stood multiple times for pericare from [facility] with stedy. Transferred to chair and pt brushed her teeth with set up. Pt telling family on the phone she was hopefully going home today and stated she would call them once she was home. Pt also attempting to use call button to answer the phone. Continue to recommend SNF.   Follow Up Recommendations  SNF    Equipment Recommendations  None recommended by OT    Recommendations for Other Services      Precautions / Restrictions Precautions Precautions: Fall       Mobility Bed Mobility Overal bed mobility: Needs Assistance Bed Mobility: Rolling;Supine to Sit Rolling: Max assist   Supine to sit: +2 for physical assistance;Max assist;HOB elevated     General bed mobility comments: cues to sequence, assist for all aspects  Transfers Overall transfer level: Needs assistance   Transfers: Sit to/from Stand Sit to Stand: +2 physical assistance;Max assist;Mod assist         General transfer comment: performed x 1 from bed with +2 max assist, x 3 from [facility] with +2 mod assist and use of stedy    Balance Overall balance assessment: Needs assistance Sitting-balance support: Bilateral upper extremity supported;Feet supported Sitting balance-Leahy Scale: Poor Sitting balance - Comments: significan posterior lean requiring max assist unless holding cross bar of  stedy Postural control: Posterior lean   Standing balance-Leahy Scale: Poor Standing balance comment: requires physical assist and use of Stedy                           ADL either performed or assessed with clinical judgement   ADL Overall ADL's : Needs assistance/impaired     Grooming: Oral care;Sitting;Set up                   Toilet Transfer: +2 for physical assistance;BSC;Total assistance Toilet Transfer Details (indicate cue type and reason): use of stedy Toileting- Clothing Manipulation and Hygiene: +2 for physical assistance;Total assistance;Sit to/from stand Toileting - Clothing Manipulation Details (indicate cue type and reason): from stedy             Vision       Perception     Praxis      Cognition Arousal/Alertness: Awake/alert Behavior During Therapy: Flat affect Overall Cognitive Status: Impaired/Different from baseline Area of Impairment: Orientation;Attention;Memory;Following commands;Safety/judgement;Awareness;Problem solving                 Orientation Level: Disoriented to;Time;Situation Current Attention Level: Sustained Memory: Decreased short-term memory Following Commands: Follows one step commands with increased time;Follows one step commands inconsistently Safety/Judgement: Decreased awareness of safety;Decreased awareness of deficits Awareness: Intellectual Problem Solving: Slow processing;Decreased initiation;Difficulty sequencing;Requires verbal cues;Requires tactile cues General Comments: pt attempting to answer phone with call button, refusing thickened water despite education in why it is necessary        Exercises     Shoulder Instructions  General Comments      Pertinent Vitals/ Pain       Pain Assessment: Faces Faces Pain Scale: No hurt  Home Living                                          Prior Functioning/Environment              Frequency  Min 2X/week         Progress Toward Goals  OT Goals(current goals can now be found in the care plan section)  Progress towards OT goals: Progressing toward goals  Acute Rehab OT Goals Patient Stated Goal: to drink unthickened water OT Goal Formulation: With patient Time For Goal Achievement: 05/23/18 Potential to Achieve Goals: Good  Plan Discharge plan remains appropriate    Co-evaluation    PT/OT/SLP Co-Evaluation/Treatment: Yes Reason for Co-Treatment: For patient/therapist safety;Necessary to address cognition/behavior during functional activity   OT goals addressed during session: ADL's and self-care      AM-PAC OT "6 Clicks" Daily Activity     Outcome Measure   Help from another person eating meals?: A Little Help from another person taking care of personal grooming?: A Little Help from another person toileting, which includes using toliet, bedpan, or urinal?: Total Help from another person bathing (including washing, rinsing, drying)?: A Lot Help from another person to put on and taking off regular upper body clothing?: A Little Help from another person to put on and taking off regular lower body clothing?: Total 6 Click Score: 13    End of Session Equipment Utilized During Treatment: Gait belt  OT Visit Diagnosis: Unsteadiness on feet (R26.81);Other abnormalities of gait and mobility (R26.89);Muscle weakness (generalized) (M62.81);Other symptoms and signs involving cognitive function   Activity Tolerance Patient tolerated treatment well   Patient Left in chair;with call bell/phone within reach   Nurse Communication Need for lift equipment        Time: 1000-1041 OT Time Calculation (min): 41 min  Charges: OT General Charges $OT Visit: 1 Visit OT Treatments $Self Care/Home Management : 8-22 mins  Nestor Lewandowsky, OTR/L Acute Rehabilitation Services Pager: [phone] Office: [phone]   Malka So 05/18/2018, 10:53 AM

## 2018-05-18 NOTE — Progress Notes (Signed)
PROGRESS NOTE    Shannon Carroll  OVF:[id number] DOB: September 24, 1950 DOA: 05/06/2018 PCP: Harlan Stains, MD   Brief Narrative:  The patient is a 68 year old female with a history of hypertension, hyperlipidemia, diet-controlled diabetes mellitus, stroke, depression, CKD stage III, diastolic CHF, aortic valve stenosis and other comorbidities who presented with shortness of breath and bilateral lower extremity edema.  Patient admitted with acute on chronic diastolic CHF. She has been started on Diuresis and has improved significantly but Renal Function started worsening so Cardiology decreased Diuresis yesterday and Cr slightly improved. Hospitalization has also been complicated by Abnormal LFT's so workup has been initiated with RUQ U/S and Acute Hepatitis Panel which has been unremarkable. Also has been complicated by ?Aspiration and Dysphagia and SLP recommending Regular Diet with Nectar Thick Liquids but patient refusing them but SLP feels she needs Nectar Thick Liquids. Was more somnolent yesterday AM and states she had a rough night and was again Somnolent.  Patient continues to request water without thickening and decided to drink it last night that the thickener and ended up coughing and choking up with then a heart rate was sustaining of 130s for almost 20 minutes yesterday. She was advised to adhere to recommendations and risks and benefits of taking thickened liquids was went over with her and I told her that if she is at a higher risk for aspiration and worsening Respiratory wise. SLP continuing to work with her but patient is agitated and refusing certain aspects of her care. Continues to refuse Nectar Thick Liquids and wants to drink Regular Water with Risk of Aspiration.   Assessment & Plan:   Principal Problem:   Acute on chronic diastolic CHF (congestive heart failure) (HCC) Active Problems:   Hyperlipidemia   Depression   Hypertension   Stroke (HCC)   CKD (chronic kidney disease),  stage III (HCC)   Normocytic anemia   Hypokalemia   Acute on chronic diastolic (congestive) heart failure (HCC)   Abnormal LFTs   Acute on chronic congestive heart failure (HCC)   SOB (shortness of breath)   Hyperbilirubinemia  Acute Respiratory Failure with Hypoxia 2/2 to Diastolic CHF -See As below -Continuous Pulse Oximetry and Maintain O2 Saturations >90% -Continue Supplemental O2 via [place] and Wean O2 as tolerated -C/w Diuresis as below and given IV Lasix this again this AM Cardiology recommending increasing po Lasix to 80 mg po BID but patient refusing po if she cannot drink Water   -Will need Home Ambulatory Screen Prior to D/C -C/w Dextromethorphan-Guaifenesin 1 tab po BIDprn Cough -Added Xopenex/Atrovent q6h and reduced to TID; -Will Add Budesonide and Add Solumedrol IV 60 mg q12h given her wheezing and will continue today  -ABG done 05/15/2018 because of Somnolence and showed 7.542/54.2/125/37.3/98.8% on 2 Liters -Repeat CXR this AM showed "Cardiomegaly with small bilateral pleural effusions. Concern for mild congestive heart failure." -Will get OOB to Chair and Ambulate with Assistance; PT/OT recommending SNF -Repeat CXR in AM  -Wean O2 as tolerated and will need an Ambulatory Home O2 Screen prior to D/C   Acute on Chronic Diastolic CHF -Slowly improving but slightly worsened today, patient presented with dyspnea, leg edema, BNP 1137 on admission and repeat was 1,077. -There was Vascular congestion on chest x-ray.   -2D echo in December 2019 showed grade 2 diastolic dysfunction.  -Strict I's/O's, Daily Weights, and Fluid Restriction to 1500 mL -Cardiology feels that patient has right heart failure and had restarted Lasix 80 mg every 12 hours along with Demadex  but now Demadex has been discontinued and Lasix had been reduced to IV 40 mg BID; Renal Fxn worsened so it was changed to po 40 mg BID per Cardiology yesterday and now titrated up to 80 mg BID; Will get an additional Dose of  IV Lasix  40 mg again this AM  -Patient is -19 lbs from admission and is also -10.728 Liters -Continue to Monitor Volume Status Carefully  -Repeat CXR this AM showed "Cardiomegaly with small bilateral pleural effusions. Concern for mild congestive heart failure." -Repeat CXR in AM -Continue Diuresis per Cardiology and Continue to Monitor Volume Status carefully   Hyperlipidemia -Discontinue Atorvastatin 40 mg po daily given Abnormal LFTs -Resume in the outpatient setting   Hypertension -Blood pressure is stable and is 128/79 -Not on Amlodipine while currently hospitalized -C/w IV Diuresis per Cardiology as above -C/w Metoprolol Succinate 12.5 mg po Daily  -C/w IV Hydralazine 5 mg IV q2hprn for SBP>175  Depression/Anxiety -Continue Bupropion 150 mg po Daily and Clonazepam 0.5 mg po BIDprn Anxiety -Also will continue with Hydroxyzine 10 mg po Daily PRN -C/w Venlafaxine 112.5 mg po Daily with Breakfast and 37.5 mg po daily with Supper  History of CVA -C/w Clopidogrel 75 mg po Daily; -Holding Atorvastatin 40 mg po Daily because of Abnormal LFTs  AKI on CKD stage III -Creatinine normally at baseline 1.0-1.3.  Creatinine had worsened and was 1.71 and Secondary to diuresis.  -Cardiology adjusted po Lasix and increased it to 80 mg BID and she was given an additional dose of IV Lasix 40 mg again this AM  -BUN/Cr improved to 30/1.18 -Continue to Monitor and Trend Renal Function as she is being diuresed  -Repeat CMP in AM   Hypokalemia -Improved. K+ is now 4.1 -Continue to Monitor and Replete with po KCl 20 mEQ BID while being diuresed  -Repeat CMP in AM   Normocytic Anemia -Patient's Hgb/Hct stable at 9.3/31.7 today  -Anemia panel on admission showed an iron level of 21, U IBC of 200, TIBC of 221, saturation ratios of 9%, ferritin level 77, folate level 21.7, and vitamin B12 of 686 -Continue with Niferex 150 mg po Daily  -Continue to monitor for signs and symptoms of  bleeding -Repeat CMP in a.m.  ?UTI/Asymptomatic Bacteriuria -Initial U/A showed Clear color appearance, strong color urine, positive nitrites, few bacteria, and 0-5 WBCs -This was repeated on 323 -Patient had abnormal UA and, with no Dysuria currently but ? If she did have dysuria previously -Urine culture has been ordered and showed >100,000 CFU of E Coli that was only resistant to Ampicillin  -S/p 5 Day Treatment Course with IV Ceftriaxone and completed course  SVT -Patient had episode of SVT 2 days ago, currently she is in normal sinus rhythm.  No more episodes of SVT.   -Continue Metoprolol Succinate 12.5 mg po Daily and IV Lopressor has been stopped  -C/w Telemetry  Morbid Obesity -Estimated body mass index is 45.02 kg/m as calculated from the following:   Height as of this encounter: _0  (1.727 m).   Weight as of this encounter: 134.3 kg. -Weight Loss and Dietary Counseling Given   Mitral Valve and Aortic Valve Stenosis -Per Cardiology will need Follow up ECHOes in the Future  Pulmonary HTN -Likely related to Valvular Heart Disease as well as OSA/OHS -Diuresis per Cardiology as above; Now on po as above -Breathing Treatments as above as well   Suspected OSA -Needs outpatient Sleep Study -Will add CPAP qHS   Increased  AG; Now having Metabolic Alkalosis in the setting of Diuresis  -Patient's AG was 16 and now improved to 12; CO2 is 33 -In the setting of Diuresis  Hyperglycemia in the setting of Diet Controlled Diabetes -Patient's CBG ranging from 101-161 -Checked HbA1c last week was 5.2 -Continue to Monitor and If blood Sugars consistently elevated then will place on Sensitive Novolog SSI AC  Abnormal LFT's, improving  -Patient's LFTs were normal on 3/18 and severely worsened; Likely from Hepatic Congestion in the setting of CHF -AST went from 29 -> 1,049 and ALT went from 18 -> 654 -Now AST is 192 and ALT is 214 -? If Patient has NASH given her Body habitus   -Discontinued Atorvastatin and Acetaminophen and Not complaining of any Abdominal Pain -Is on Heparin 5,000 units sq q8h and was changed yesterday from Enoxaparin -Checked GGT and was 130 (In the setting of CHF) and Lipase Level (34)  -Checked RUQ U/S and showed Mild hepatic steatosis.No gallstones. No biliary dilatation. Small volume of ascites.  Right pleural effusion -Acute Hepatitis Panel Negative  -Continue to Monitor and Trend Hepatic Fxn -Repeat CMP  Hyperbilirubinemia  -Patient's T Bili was 1.6 and improved slightly to 1.2 -Checked RUQ U/S and Acute Hepatitis Panel as above -Continue to Monitor and Repeat CMP in AM   ?Aspiration/Dysphagia -Had some choking and coughing and continues to have it.  -Made NPO and SLP evaluated -SLP recommending Regular Diet with Nectar Thick Liquids but patient has been refusing Nectar Thick Liquids; Will have SLP re-evaluate and still recommending [place] -A very lengthy discussion with the patient about adhering to the recommendations recommended by the speech therapist as she is at high risk for aspiration. Risks and benefits were again discussed with the patient and patient does not want nectar thick liquids and states that she will still drink water normally.  Yesterday she had an episode which he drank regular water and had a choking episode and started coughing and heart rate went to the 130s.  She will continue to drink water the way she wants despite my medical recommendations. -Added Xopenex/Atrovent for breathing -Continue to Monitor closely  -Repeat Chest x-ray in the a.m.  Intermittent Confusion/Delirum, improved  -Was In the setting of Hypoxia -C/w Supplemental O2 via [place] and wean O2 as tolerated  -Will place on Delirium Precautions -Need to closely Monitor while she is on Wellbutrin XL and Venlafaxine -Continue to Monitor closely   Somnolence, improved -Checked ABG and as above -Had worsening respiratory Symptoms with  Wheezing today ad likely from Pulmonary Edema and still wheezing and somnolent this AM but easily arousable -Will order CPAP -States she had a rough night and did not sleep well -Continue to Monitor Closely   Hypophosphatemia -Patient's Phos Level this AM was 2.4 and repeat wa 4.1 -Continue to Monitor and Replete as Necessary -Repeat Phos Level in the AM  -Repeat  Leukopenia, improved  -Mild and WBC went from 9.7 -> 3.9 -> 6.9 -Continue to monitor as patient is on IV steroids and suspecting aspiration -Repeat CBC in AM   DVT prophylaxis: Enoxaparin 40 mg sq q24h to be D/C'd given worsening Renal Fxn and was started on Heparin 5,000 units sq q8h Code Status: FULL CODE Family Communication: No family present at bedside and she did not give me permission to call her sons and update them Disposition Plan: SNF in the next 24-48 hours   Consultants:   Cardiology   Procedures:  ECHOCARDIOGRAM 05/07/2018 IMPRESSIONS  1. The left ventricle has hyperdynamic systolic function, with an ejection fraction of >65%. The cavity size was normal. There is mildly increased left ventricular wall thickness. Left ventricular diastolic Doppler parameters are indeterminate.  Indeterminate filling pressures There is incoordinate septal motion.  2. The right ventricle has mildly reduced systolic function. The cavity was mildly enlarged. There is no increase in right ventricular wall thickness.  3. Left atrial size was moderately dilated.  4. Right atrial size was mildly dilated.  5. The mitral valve is degenerative. Mild thickening of the mitral valve leaflet. Moderate calcification of the mitral valve leaflet. There is moderate to severe mitral annular calcification present. Moderate mitral valve stenosis.  6. The tricuspid valve is grossly normal.  7. The aortic valve was not well visualized.  8. The aortic root and ascending aorta are normal in size and structure.  9. The inferior vena cava was  dilated in size with <50% respiratory variability. 10. The interatrial septum was not well visualized. 11. When compared to the prior study: 01/29/2018: LVEF 65-70%, moderate LAE, moderate mitral stenosis.  SUMMARY   LVEF 65-70%, incoordinate septal motion, mild LVH, mildly reduced RV systolic function, moderate LAE, mild RA, heavy MAC with moderate mitral stenosis and trivial MR, mild TR, RVSP 53 mmHg, dilated IVC  FINDINGS  Left Ventricle: The left ventricle has hyperdynamic systolic function, with an ejection fraction of >65%. The cavity size was normal. There is mildly increased left ventricular wall thickness. Left ventricular diastolic Doppler parameters are  indeterminate. Indeterminate filling pressures There is incoordinate septal motion. Definity contrast agent was given IV to delineate the left ventricular endocardial borders. Right Ventricle: The right ventricle has mildly reduced systolic function. The cavity was mildly enlarged. There is no increase in right ventricular wall thickness. Left Atrium: left atrial size was moderately dilated Right Atrium: right atrial size was mildly dilated. Right atrial pressure is estimated at 15 mmHg. Interatrial Septum: The interatrial septum was not well visualized. Pericardium: There is no evidence of pericardial effusion. Mitral Valve: The mitral valve is degenerative in appearance. Mild thickening of the mitral valve leaflet. Moderate calcification of the mitral valve leaflet. There is moderate to severe mitral annular calcification present. Mitral valve regurgitation is  trivial by color flow Doppler. Moderate mitral valve stenosis. Tricuspid Valve: The tricuspid valve is grossly normal. Tricuspid valve regurgitation is mild by color flow Doppler. Aortic Valve: The aortic valve was not well visualized Aortic valve regurgitation was not visualized by color flow Doppler. There is no evidence of aortic valve stenosis. Pulmonic Valve: The  pulmonic valve was not well visualized. Pulmonic valve regurgitation is not visualized by color flow Doppler. Aorta: The aortic root and ascending aorta are normal in size and structure. Venous: The inferior vena cava measures 2.60 cm, is dilated in size with less than 50% respiratory variability. Compared to previous exam: 01/29/2018: LVEF 65-70%, moderate LAE, moderate mitral stenosis.   LEFT VENTRICLE PLAX 2D LVIDd:         4.59 cm LVIDs:         3.51 cm LV PW:         1.14 cm LV IVS:        1.05 cm LVOT diam:     2.20 cm LV SV:         46 ml LV SV Index:   17.09 LVOT Area:     3.80 cm  RIGHT VENTRICLE RVSP:           52.9  mmHg  LEFT ATRIUM         Index      RIGHT ATRIUM LA diam:    3.60 cm 1.46 cm/m RA Pressure: 15 mmHg  AORTIC VALVE LVOT Vmax:   56.20 cm/s LVOT Vmean:  36.900 cm/s LVOT VTI:    0.100 m   AORTA Ao Root diam: 2.60 cm  MITRAL VALVE             TRICUSPID VALVE MV Peak grad: 20.8 mmHg  TR Peak grad:   37.9 mmHg MV Mean grad: 8.0 mmHg   TR Vmax:        308.00 cm/s MV Vmax:      2.28 m/s   RVSP:           52.9 mmHg MV Vmean:     135.0 cm/s MV VTI:       0.51 m     SHUNTS                          Systemic VTI:  0.10 m                          Systemic Diam: 2.20 cm  IVC IVC diam: 2.60 cm   Antimicrobials:  Anti-infectives (From admission, onward)   Start     Dose/Rate Route Frequency Ordered Stop   05/12/18 1100  cefTRIAXone (ROCEPHIN) 1 g in sodium chloride 0.9 % 100 mL IVPB  Status:  Discontinued     1 g 200 mL/hr over 30 Minutes Intravenous Every 24 hours 05/12/18 1012 05/16/18 1210     Subjective: Seen and examined at bedside this AM she was a little agitated and not wanting to drink nectar thick liquids and refusing certain parts of her care if she did not get water.  No nausea or vomiting.  States shortness of breath is stable.  No lightheadedness or dizziness.  No other concerns or complaints at this time but her biggest complaint was  that she was just wanting "water."  Objective: Vitals:   05/18/18 0130 05/18/18 0636 05/18/18 1046 05/18/18 1322  BP: (!) 126/96 (!) 118/96 122/80 128/79  Pulse: 80  80 80  Resp:    19  Temp:  (!) 97.5 F (36.4 C)  97.6 F (36.4 C)  TempSrc:  Oral  Oral  SpO2: 100% 100% 100% 100%  Weight:      Height:        Intake/Output Summary (Last 24 hours) at 05/18/2018 1345 Last data filed at 05/18/2018 1241 Gross per 24 hour  Intake 480 ml  Output 1100 ml  Net -620 ml   Filed Weights   05/16/18 0224 05/17/18 0402 05/18/18 0019  Weight: (!) 136.2 kg 134.3 kg 134.3 kg   Examination: Physical Exam:  Constitutional: Well-nourished, well-developed morbidly obese Caucasian female who was awake and agitated and being belligerent with the staff Eyes: Lids and conjunctive are normal.  Sclera anicteric ENMT: External ears and nose appear normal.  Sclera anicteric Neck: Appears supple and JVD was hard to assess given her body habitus Respiratory: Diminished to auscultation with some coarse breath sounds and some slight wheezing and crackles.  She is wearing supplemental oxygen via nasal cannula was not tachypneic today Cardiovascular: Regular rate and rhythm.  Has a 2 out of 6 systolic murmur.  Has 1+ lower extremity edema with legs wrapped Abdomen: Soft, nontender, distended secondary body habitus.  Bowel sounds present  GU: Deferred Musculoskeletal: No contractures or cyanosis noted.  No joint deformities in upper lower extremities Skin: Skin is warm and dry with no appreciable rashes or lesions on skin evaluation bilateral lower extremities were wrapped in [place] boots Neurologic: Cranial nerves II through XII grossly intact no appreciable focal deficits. Psychiatric: More alert and awake today.  Was not somnolent but very agitated again and trying to bargain trying to get water that was not nectar thick.  Data Reviewed: I have personally reviewed following labs and imaging  studies  CBC: Recent Labs  Lab 05/14/18 0337 05/15/18 0317 05/16/18 0400 05/17/18 0307 05/18/18 0408  WBC 8.6 7.3 6.6 3.9* 6.9  NEUTROABS 6.9 5.9 5.5 3.4 6.2  HGB 9.4* 9.3* 9.2* 9.0* 9.3*  HCT 30.7* 30.9* 32.7* 30.4* 31.7*  MCV 86.2 88.0 88.4 87.9 88.5  PLT 202 183 185 165 381   Basic Metabolic Panel: Recent Labs  Lab 05/14/18 0337 05/15/18 0317 05/16/18 0400 05/17/18 0307 05/18/18 0408  NA 138 142 144 141 143  K 3.7 3.1* 3.6 4.0 4.1  CL 94* 94* 98 100 98  CO2 30 33* 33* 32 33*  GLUCOSE 113* 101* 106* 161* 157*  BUN 36* 28* 25* 24* 30*  CREATININE 1.64* 1.51* 1.42* 1.18* 1.18*  CALCIUM 8.0* 8.4* 8.6* 8.3* 8.7*  MG 2.1 2.1 2.2 2.1 2.2  PHOS 4.0 3.4 2.4* 3.5 4.1   GFR: Estimated Creatinine Clearance: 67.3 mL/min (A) (by C-G formula based on SCr of 1.18 mg/dL (H)). Liver Function Tests: Recent Labs  Lab 05/14/18 0337 05/15/18 0317 05/16/18 0400 05/17/18 0307 05/18/18 0408  AST 1,049* 731* 450* 292* 192*  ALT 654* 506* 362* 275* 214*  ALKPHOS 185* 162* 167* 158* 139*  BILITOT 1.6* 1.3* 1.2 1.4* 1.2  PROT 6.1* 6.5 6.5 6.6 6.8  ALBUMIN 2.7* 2.8* 2.8* 2.7* 2.8*   Recent Labs  Lab 05/14/18 0337  LIPASE 34   No results for input(s): AMMONIA in the last 168 hours. Coagulation Profile: No results for input(s): INR, PROTIME in the last 168 hours. Cardiac Enzymes: No results for input(s): CKTOTAL, CKMB, CKMBINDEX, TROPONINI in the last 168 hours. BNP (last 3 results) No results for input(s): PROBNP in the last 8760 hours. HbA1C: No results for input(s): HGBA1C in the last 72 hours. CBG: No results for input(s): GLUCAP in the last 168 hours. Lipid Profile: No results for input(s): CHOL, HDL, LDLCALC, TRIG, CHOLHDL, LDLDIRECT in the last 72 hours. Thyroid Function Tests: No results for input(s): TSH, T4TOTAL, FREET4, T3FREE, THYROIDAB in the last 72 hours. Anemia Panel: No results for input(s): VITAMINB12, FOLATE, FERRITIN, TIBC, IRON, RETICCTPCT in the last  72 hours. Sepsis Labs: No results for input(s): PROCALCITON, LATICACIDVEN in the last 168 hours.  Recent Results (from the past 240 hour(s))  Culture, Urine     Status: Abnormal   Collection Time: 05/11/18 10:03 AM  Result Value Ref Range Status   Specimen Description URINE, RANDOM  Final   Special Requests   Final    NONE Performed at [facility], [address] [place], [place] [address]    Culture >=100,000 COLONIES/mL ESCHERICHIA COLI (A)  Final   Report Status 05/13/2018 FINAL  Final   Organism ID, Bacteria ESCHERICHIA COLI (A)  Final      Susceptibility   Escherichia coli - MIC*    AMPICILLIN 16 INTERMEDIATE Intermediate     CEFAZOLIN <=4 SENSITIVE Sensitive     CEFTRIAXONE <=1 SENSITIVE Sensitive     CIPROFLOXACIN <=0.25  SENSITIVE Sensitive     GENTAMICIN <=1 SENSITIVE Sensitive     IMIPENEM <=0.25 SENSITIVE Sensitive     NITROFURANTOIN <=16 SENSITIVE Sensitive     TRIMETH/SULFA <=20 SENSITIVE Sensitive     AMPICILLIN/SULBACTAM 8 SENSITIVE Sensitive     PIP/TAZO 8 SENSITIVE Sensitive     Extended ESBL NEGATIVE Sensitive     * >=100,000 COLONIES/mL ESCHERICHIA COLI      Radiology Studies: Dg Chest Port 1 View  Result Date: 05/18/2018 CLINICAL DATA:  Short of breath EXAM: PORTABLE CHEST 1 VIEW COMPARISON:  05/17/2018 FINDINGS: Stable enlarged cardiac silhouette. Bilateral pleural effusions similar prior. A central venous congestion. No focal consolidation. No pneumothorax. IMPRESSION: Cardiomegaly with small bilateral pleural effusions. Concern for mild congestive heart failure Electronically Signed   By: Suzy Bouchard M.D.   On: 05/18/2018 08:16   Dg Chest Port 1 View  Result Date: 05/17/2018 CLINICAL DATA:  Shortness of breath EXAM: PORTABLE CHEST 1 VIEW COMPARISON:  05/16/2018 FINDINGS: Cardiomegaly with mild perihilar edema and small bilateral pleural effusions, grossly unchanged. No pneumothorax. IMPRESSION: Cardiomegaly mild interstitial edema and small  bilateral pleural effusions, grossly unchanged. Electronically Signed   By: Julian Hy M.D.   On: 05/17/2018 08:11    Scheduled Meds:  budesonide (PULMICORT) nebulizer solution  0.25 mg Nebulization BID   buPROPion  150 mg Oral Daily   clopidogrel  75 mg Oral Daily   feeding supplement (PRO-STAT SUGAR FREE 64)  30 mL Oral BID   furosemide  80 mg Oral BID   heparin injection (subcutaneous)  5,000 Units Subcutaneous Q8H   ipratropium  0.5 mg Nebulization BID   iron polysaccharides  150 mg Oral Daily   levalbuterol  0.63 mg Nebulization BID   mouth rinse  15 mL Mouth Rinse BID   methylPREDNISolone (SOLU-MEDROL) injection  60 mg Intravenous Q12H   metoprolol succinate  12.5 mg Oral Daily   mirabegron ER  25 mg Oral Daily   multivitamin with minerals  1 tablet Oral Daily   pantoprazole  40 mg Oral Daily   potassium chloride  20 mEq Oral BID   senna-docusate  1 tablet Oral BID   sodium chloride flush  3 mL Intravenous Q12H   venlafaxine  112.5 mg Oral Q breakfast   And   venlafaxine  37.5 mg Oral QAC supper   Continuous Infusions:  sodium chloride Stopped (05/16/18 1219)    LOS: 12 days   Kerney Elbe, DO [facility] PAGER is on AMION  If 7PM-7AM, please contact night-coverage [email or web address] Password [email or web address] 05/18/2018, 1:45 PM

## 2018-05-19 ENCOUNTER — Inpatient Hospital Stay (HOSPITAL_COMMUNITY): Payer: Medicare Other

## 2018-05-19 DIAGNOSIS — G4730 Sleep apnea, unspecified: Secondary | ICD-10-CM | POA: Diagnosis not present

## 2018-05-19 DIAGNOSIS — T886XXA Anaphylactic reaction due to adverse effect of correct drug or medicament properly administered, initial encounter: Secondary | ICD-10-CM | POA: Diagnosis not present

## 2018-05-19 DIAGNOSIS — I248 Other forms of acute ischemic heart disease: Secondary | ICD-10-CM | POA: Diagnosis present

## 2018-05-19 DIAGNOSIS — R0689 Other abnormalities of breathing: Secondary | ICD-10-CM | POA: Diagnosis not present

## 2018-05-19 DIAGNOSIS — J9621 Acute and chronic respiratory failure with hypoxia: Secondary | ICD-10-CM | POA: Diagnosis not present

## 2018-05-19 DIAGNOSIS — E662 Morbid (severe) obesity with alveolar hypoventilation: Secondary | ICD-10-CM | POA: Diagnosis not present

## 2018-05-19 DIAGNOSIS — R0603 Acute respiratory distress: Secondary | ICD-10-CM | POA: Diagnosis not present

## 2018-05-19 DIAGNOSIS — J9601 Acute respiratory failure with hypoxia: Secondary | ICD-10-CM

## 2018-05-19 DIAGNOSIS — J9620 Acute and chronic respiratory failure, unspecified whether with hypoxia or hypercapnia: Secondary | ICD-10-CM | POA: Diagnosis not present

## 2018-05-19 DIAGNOSIS — I69919 Unspecified symptoms and signs involving cognitive functions following unspecified cerebrovascular disease: Secondary | ICD-10-CM | POA: Diagnosis not present

## 2018-05-19 DIAGNOSIS — M6281 Muscle weakness (generalized): Secondary | ICD-10-CM | POA: Diagnosis not present

## 2018-05-19 DIAGNOSIS — N179 Acute kidney failure, unspecified: Secondary | ICD-10-CM | POA: Diagnosis not present

## 2018-05-19 DIAGNOSIS — R609 Edema, unspecified: Secondary | ICD-10-CM | POA: Diagnosis not present

## 2018-05-19 DIAGNOSIS — I5033 Acute on chronic diastolic (congestive) heart failure: Secondary | ICD-10-CM | POA: Diagnosis not present

## 2018-05-19 DIAGNOSIS — R7989 Other specified abnormal findings of blood chemistry: Secondary | ICD-10-CM | POA: Diagnosis not present

## 2018-05-19 DIAGNOSIS — R0602 Shortness of breath: Secondary | ICD-10-CM | POA: Diagnosis not present

## 2018-05-19 DIAGNOSIS — E118 Type 2 diabetes mellitus with unspecified complications: Secondary | ICD-10-CM | POA: Diagnosis not present

## 2018-05-19 DIAGNOSIS — R945 Abnormal results of liver function studies: Secondary | ICD-10-CM | POA: Diagnosis not present

## 2018-05-19 DIAGNOSIS — I130 Hypertensive heart and chronic kidney disease with heart failure and stage 1 through stage 4 chronic kidney disease, or unspecified chronic kidney disease: Secondary | ICD-10-CM | POA: Diagnosis not present

## 2018-05-19 DIAGNOSIS — I639 Cerebral infarction, unspecified: Secondary | ICD-10-CM | POA: Diagnosis not present

## 2018-05-19 DIAGNOSIS — E119 Type 2 diabetes mellitus without complications: Secondary | ICD-10-CM | POA: Diagnosis not present

## 2018-05-19 DIAGNOSIS — M2550 Pain in unspecified joint: Secondary | ICD-10-CM | POA: Diagnosis not present

## 2018-05-19 DIAGNOSIS — R6521 Severe sepsis with septic shock: Secondary | ICD-10-CM | POA: Diagnosis not present

## 2018-05-19 DIAGNOSIS — R1312 Dysphagia, oropharyngeal phase: Secondary | ICD-10-CM | POA: Diagnosis not present

## 2018-05-19 DIAGNOSIS — R278 Other lack of coordination: Secondary | ICD-10-CM | POA: Diagnosis not present

## 2018-05-19 DIAGNOSIS — J1289 Other viral pneumonia: Secondary | ICD-10-CM | POA: Diagnosis not present

## 2018-05-19 DIAGNOSIS — R6889 Other general symptoms and signs: Secondary | ICD-10-CM | POA: Diagnosis not present

## 2018-05-19 DIAGNOSIS — G9341 Metabolic encephalopathy: Secondary | ICD-10-CM | POA: Diagnosis not present

## 2018-05-19 DIAGNOSIS — A419 Sepsis, unspecified organism: Secondary | ICD-10-CM | POA: Diagnosis not present

## 2018-05-19 DIAGNOSIS — E876 Hypokalemia: Secondary | ICD-10-CM | POA: Diagnosis not present

## 2018-05-19 DIAGNOSIS — I6389 Other cerebral infarction: Secondary | ICD-10-CM | POA: Diagnosis not present

## 2018-05-19 DIAGNOSIS — R000 Tachycardia, unspecified: Secondary | ICD-10-CM | POA: Diagnosis not present

## 2018-05-19 DIAGNOSIS — J690 Pneumonitis due to inhalation of food and vomit: Secondary | ICD-10-CM | POA: Diagnosis not present

## 2018-05-19 DIAGNOSIS — R41841 Cognitive communication deficit: Secondary | ICD-10-CM | POA: Diagnosis not present

## 2018-05-19 DIAGNOSIS — E870 Hyperosmolality and hypernatremia: Secondary | ICD-10-CM | POA: Diagnosis not present

## 2018-05-19 DIAGNOSIS — J189 Pneumonia, unspecified organism: Secondary | ICD-10-CM | POA: Diagnosis not present

## 2018-05-19 DIAGNOSIS — Z7409 Other reduced mobility: Secondary | ICD-10-CM | POA: Diagnosis not present

## 2018-05-19 DIAGNOSIS — R0902 Hypoxemia: Secondary | ICD-10-CM | POA: Diagnosis not present

## 2018-05-19 DIAGNOSIS — Z7401 Bed confinement status: Secondary | ICD-10-CM | POA: Diagnosis not present

## 2018-05-19 DIAGNOSIS — E785 Hyperlipidemia, unspecified: Secondary | ICD-10-CM | POA: Diagnosis not present

## 2018-05-19 DIAGNOSIS — M7989 Other specified soft tissue disorders: Secondary | ICD-10-CM | POA: Diagnosis not present

## 2018-05-19 DIAGNOSIS — R7881 Bacteremia: Secondary | ICD-10-CM | POA: Diagnosis not present

## 2018-05-19 DIAGNOSIS — J80 Acute respiratory distress syndrome: Secondary | ICD-10-CM | POA: Diagnosis not present

## 2018-05-19 DIAGNOSIS — F0150 Vascular dementia without behavioral disturbance: Secondary | ICD-10-CM | POA: Diagnosis present

## 2018-05-19 DIAGNOSIS — E872 Acidosis: Secondary | ICD-10-CM | POA: Diagnosis not present

## 2018-05-19 DIAGNOSIS — I10 Essential (primary) hypertension: Secondary | ICD-10-CM | POA: Diagnosis not present

## 2018-05-19 DIAGNOSIS — D649 Anemia, unspecified: Secondary | ICD-10-CM | POA: Diagnosis not present

## 2018-05-19 DIAGNOSIS — R509 Fever, unspecified: Secondary | ICD-10-CM | POA: Diagnosis not present

## 2018-05-19 DIAGNOSIS — L602 Onychogryphosis: Secondary | ICD-10-CM | POA: Diagnosis not present

## 2018-05-19 DIAGNOSIS — Z66 Do not resuscitate: Secondary | ICD-10-CM | POA: Diagnosis present

## 2018-05-19 DIAGNOSIS — E1122 Type 2 diabetes mellitus with diabetic chronic kidney disease: Secondary | ICD-10-CM | POA: Diagnosis present

## 2018-05-19 DIAGNOSIS — I471 Supraventricular tachycardia: Secondary | ICD-10-CM | POA: Diagnosis not present

## 2018-05-19 DIAGNOSIS — N183 Chronic kidney disease, stage 3 (moderate): Secondary | ICD-10-CM | POA: Diagnosis present

## 2018-05-19 DIAGNOSIS — T17908D Unspecified foreign body in respiratory tract, part unspecified causing other injury, subsequent encounter: Secondary | ICD-10-CM | POA: Diagnosis not present

## 2018-05-19 DIAGNOSIS — J683 Other acute and subacute respiratory conditions due to chemicals, gases, fumes and vapors: Secondary | ICD-10-CM | POA: Diagnosis not present

## 2018-05-19 DIAGNOSIS — R4702 Dysphasia: Secondary | ICD-10-CM | POA: Diagnosis not present

## 2018-05-19 DIAGNOSIS — F39 Unspecified mood [affective] disorder: Secondary | ICD-10-CM | POA: Diagnosis not present

## 2018-05-19 DIAGNOSIS — F329 Major depressive disorder, single episode, unspecified: Secondary | ICD-10-CM | POA: Diagnosis not present

## 2018-05-19 DIAGNOSIS — A4189 Other specified sepsis: Secondary | ICD-10-CM | POA: Diagnosis not present

## 2018-05-19 DIAGNOSIS — R6520 Severe sepsis without septic shock: Secondary | ICD-10-CM | POA: Diagnosis not present

## 2018-05-19 DIAGNOSIS — Z6841 Body Mass Index (BMI) 40.0 and over, adult: Secondary | ICD-10-CM | POA: Diagnosis not present

## 2018-05-19 DIAGNOSIS — D6489 Other specified anemias: Secondary | ICD-10-CM | POA: Diagnosis not present

## 2018-05-19 LAB — COMPREHENSIVE METABOLIC PANEL
ALT: 157 U/L — AB (ref 0–44)
AST: 114 U/L — ABNORMAL HIGH (ref 15–41)
Albumin: 2.7 g/dL — ABNORMAL LOW (ref 3.5–5.0)
Alkaline Phosphatase: 123 U/L (ref 38–126)
Anion gap: 8 (ref 5–15)
BUN: 32 mg/dL — ABNORMAL HIGH (ref 8–23)
CO2: 38 mmol/L — ABNORMAL HIGH (ref 22–32)
Calcium: 8.2 mg/dL — ABNORMAL LOW (ref 8.9–10.3)
Chloride: 93 mmol/L — ABNORMAL LOW (ref 98–111)
Creatinine, Ser: 1.22 mg/dL — ABNORMAL HIGH (ref 0.44–1.00)
GFR calc non Af Amer: 46 mL/min — ABNORMAL LOW (ref >60)
GFR, EST AFRICAN AMERICAN: 53 mL/min — AB (ref >60)
Glucose, Bld: 155 mg/dL — ABNORMAL HIGH (ref 70–99)
Potassium: 3.8 mmol/L (ref 3.5–5.1)
Sodium: 139 mmol/L (ref 135–145)
Total Bilirubin: 1.1 mg/dL (ref 0.3–1.2)
Total Protein: 6.4 g/dL — ABNORMAL LOW (ref 6.5–8.1)

## 2018-05-19 LAB — CBC WITH DIFFERENTIAL/PLATELET
Abs Immature Granulocytes: 0.06 10*3/uL (ref 0.00–0.07)
Basophils Absolute: 0 10*3/uL (ref 0.0–0.1)
Basophils Relative: 0 %
Eosinophils Absolute: 0 10*3/uL (ref 0.0–0.5)
Eosinophils Relative: 0 %
HCT: 30.3 % — ABNORMAL LOW (ref 36.0–46.0)
Hemoglobin: 9 g/dL — ABNORMAL LOW (ref 12.0–15.0)
Immature Granulocytes: 1 %
Lymphocytes Relative: 5 %
Lymphs Abs: 0.4 10*3/uL — ABNORMAL LOW (ref 0.7–4.0)
MCH: 26.1 pg (ref 26.0–34.0)
MCHC: 29.7 g/dL — ABNORMAL LOW (ref 30.0–36.0)
MCV: 87.8 fL (ref 80.0–100.0)
Monocytes Absolute: 0.3 10*3/uL (ref 0.1–1.0)
Monocytes Relative: 4 %
NEUTROS ABS: 7.9 10*3/uL — AB (ref 1.7–7.7)
Neutrophils Relative %: 90 %
Platelets: 162 10*3/uL (ref 150–400)
RBC: 3.45 MIL/uL — ABNORMAL LOW (ref 3.87–5.11)
RDW: 17.7 % — ABNORMAL HIGH (ref 11.5–15.5)
WBC: 8.7 10*3/uL (ref 4.0–10.5)
nRBC: 0 % (ref 0.0–0.2)

## 2018-05-19 LAB — PHOSPHORUS: Phosphorus: 3.4 mg/dL (ref 2.5–4.6)

## 2018-05-19 LAB — MAGNESIUM: MAGNESIUM: 2 mg/dL (ref 1.7–2.4)

## 2018-05-19 MED ORDER — HYDROXYZINE HCL 10 MG PO TABS: 10.0000 mg | ORAL_TABLET | Freq: Three times a day (TID) | ORAL | 0 refills | Status: AC | PRN

## 2018-05-19 MED ORDER — POTASSIUM CHLORIDE CRYS ER 20 MEQ PO TBCR: 20.0000 meq | EXTENDED_RELEASE_TABLET | Freq: Two times a day (BID) | ORAL | Status: AC

## 2018-05-19 MED ORDER — LEVALBUTEROL HCL 0.63 MG/3ML IN NEBU: 0.6300 mg | INHALATION_SOLUTION | Freq: Four times a day (QID) | RESPIRATORY_TRACT | 12 refills | Status: AC | PRN

## 2018-05-19 MED ORDER — PREDNISONE 10 MG (21) PO TBPK: ORAL_TABLET | ORAL | 0 refills | Status: AC

## 2018-05-19 MED ORDER — DM-GUAIFENESIN ER 30-600 MG PO TB12: 1.0000 | ORAL_TABLET | Freq: Two times a day (BID) | ORAL | 0 refills | Status: AC | PRN

## 2018-05-19 MED ORDER — PRO-STAT SUGAR FREE PO LIQD: 30.0000 mL | Freq: Two times a day (BID) | ORAL | 0 refills | Status: AC

## 2018-05-19 MED ORDER — METOPROLOL SUCCINATE ER 25 MG PO TB24: 12.5000 mg | ORAL_TABLET | Freq: Every day | ORAL | 0 refills | Status: AC

## 2018-05-19 MED ORDER — IPRATROPIUM BROMIDE 0.02 % IN SOLN: 0.5000 mg | Freq: Four times a day (QID) | RESPIRATORY_TRACT | 12 refills | Status: AC | PRN

## 2018-05-19 MED ORDER — SENNOSIDES-DOCUSATE SODIUM 8.6-50 MG PO TABS: 1.0000 | ORAL_TABLET | Freq: Two times a day (BID) | ORAL | 0 refills | Status: AC

## 2018-05-19 MED ORDER — RESOURCE THICKENUP CLEAR PO POWD: 1.0000 | ORAL | 0 refills | Status: AC | PRN

## 2018-05-19 MED ORDER — CLONAZEPAM 0.5 MG PO TABS: 0.5000 mg | ORAL_TABLET | Freq: Two times a day (BID) | ORAL | 0 refills | Status: AC | PRN

## 2018-05-19 MED ORDER — FUROSEMIDE 80 MG PO TABS: 80.0000 mg | ORAL_TABLET | Freq: Two times a day (BID) | ORAL | 0 refills | Status: AC

## 2018-05-19 NOTE — TOC Progression Note (Deleted)
Transition of Care [facility]) - Progression Note    Patient Details  Name: Shannon Carroll MRN: [id number] Date of Birth: 08/24/1950  Transition of Care [facility]) CM/SW [place], LCSW Phone Number: 05/19/2018, 8:21 AM  Clinical Narrative: Daughter left CSW a voicemail last night to see if we could facilitate getting patient's prosthetics to the hospital from his ALF. CSW called and spoke with their hospital liaison. She will call and see how we can get this done and call CSW back.  Expected Discharge Plan: Assisted Living Barriers to Discharge: Continued Medical Work up  Expected Discharge Plan and Services Expected Discharge Plan: Assisted Living       Living arrangements for the past 2 months: Assisted Living Facility                           Social Determinants of Health (SDOH) Interventions    Readmission Risk Interventions No flowsheet data found.

## 2018-05-19 NOTE — Progress Notes (Signed)
SATURATION QUALIFICATIONS: (This note is used to comply with regulatory documentation for home oxygen)  Patient Saturations on Room Air at Rest = 87%  Patient Saturations on Room Air while Ambulating = NA%  Patient Saturations on 2Liters of oxygen while Ambulating = 94%  Patient cannot walk, RN check O2 while patient was siting.

## 2018-05-19 NOTE — Progress Notes (Signed)
  Speech Language Pathology Treatment: Dysphagia  Patient Details Name: Shannon Carroll MRN: [id number] DOB: 1950-03-17 Today's Date: 05/19/2018 Time: 1062-6948 SLP Time Calculation (min) (ACUTE ONLY): 10 min  Assessment / Plan / Recommendation Clinical Impression  Pt consumed approximately 8 ounces of nectar thick liquids via straw with no cueing needed for slow pacing and self-regulation. When offered the cup, pt began drinking without asking if there was thickener in the cup, and drank it in its entirerty without any resistance. RN said that she consumed all of her breakfast tray this morning as well, including nectar thick liquids. No overt s/s of aspiration were observed with nectar thick liquids during skilled SLP observation. Recommend continuing current diet and precautions. Discussed with MD - it is unclear at this time if there is any more chronic component to her dysphagia, but will continue to follow acutely to determine if advancements can safely be made. Recommend additional SLP f/u at SNF upon discharge for the same.    HPI HPI: 68 year old female with a history of hypertension, hyperlipidemia, diet-controlled diabetes mellitus, stroke, depression, CKD stage III, diastolic CHF, aortic valve stenosis came with shortness of breath and bilateral lower extremity edema.  Patient admitted with acute on chronic diastolic CHF. On 3/26 she was noted to be coughing with PO intake, therefore SLP swallow evaluation was ordered.      SLP Plan  Continue with current plan of care       Recommendations  Diet recommendations: Regular;Nectar-thick liquid Liquids provided via: Cup;Straw Medication Administration: Whole meds with puree Supervision: Patient able to self feed(set-up assist) Compensations: Slow rate;Small sips/bites Postural Changes and/or Swallow Maneuvers: Seated upright 90 degrees                Oral Care Recommendations: Oral care BID Follow up Recommendations: Skilled  Nursing facility SLP Visit Diagnosis: Dysphagia, unspecified (R13.10) Plan: Continue with current plan of care       GO                Shannon Carroll 05/19/2018, 10:26 AM  Shannon Carroll, M.A. [place] Acute Environmental education officer [phone] Office [phone]

## 2018-05-19 NOTE — Care Management Important Message (Signed)
Important Message  Patient Details  Name: Shannon Carroll MRN: [id number] Date of Birth: 07/15/1950   Medicare Important Message Given:  Yes    Ulric Salzman 05/19/2018, 2:50 PM

## 2018-05-19 NOTE — Progress Notes (Signed)
Physical Therapy Treatment Patient Details Name: Shannon Carroll MRN: [id number] DOB: 1950-05-26 Today's Date: 05/19/2018    History of Present Illness 68 year old female with a history of hypertension, hyperlipidemia, diet-controlled diabetes mellitus, stroke, depression, CKD stage III, diastolic CHF, aortic valve stenosis came with shortness of breath and bilateral lower extremity edema.  Patient admitted with acute on chronic diastolic CHF.    PT Comments    Pt was in bed upon PT arrival for treatment. Pt reported she was getting out of here in about 3 minutes and does not have time but agreed to moving to chair. Pt was very confused and agitated but cooperative with requesting movement. Attempted to use stedy to move pt to chair but pt was afraid to lean forward for fear of falling despite cues- pushed back to sit on bed. Saraplus utilized successfully to move pt to chair. Pt was left to breathe on RA per MD instruction per RN. Pt was left sitting in chair.   Follow Up Recommendations  SNF;Supervision for mobility/OOB     Equipment Recommendations  None recommended by PT    Recommendations for Other Services       Precautions / Restrictions Precautions Precautions: Fall Restrictions Weight Bearing Restrictions: No    Mobility  Bed Mobility Overal bed mobility: Needs Assistance Bed Mobility: Rolling;Sidelying to Sit;Supine to Sit Rolling: Max assist;+2 for physical assistance Sidelying to sit: +2 for physical assistance;Max assist Supine to sit: +2 for physical assistance;Max assist     General bed mobility comments: cues and assistance required for all movements  Transfers Overall transfer level: Needs assistance   Transfers: Sit to/from Stand Sit to Stand: +2 safety/equipment;+2 physical assistance;Max assist            Ambulation/Gait                 Stairs             Wheelchair Mobility    Modified Rankin (Stroke Patients Only)        Balance                                            Cognition Arousal/Alertness: Awake/alert Behavior During Therapy: Agitated;Anxious Overall Cognitive Status: Impaired/Different from baseline Area of Impairment: Awareness;Memory;Safety/judgement;Orientation;Problem solving                 Orientation Level: Disoriented to;Time;Situation Current Attention Level: Sustained Memory: Decreased short-term memory Following Commands: Follows one step commands inconsistently Safety/Judgement: Decreased awareness of safety;Decreased awareness of deficits Awareness: Intellectual Problem Solving: Slow processing;Decreased initiation;Requires verbal cues;Requires tactile cues General Comments: pt requesting water- "I don't care if I aspirate"      Exercises      General Comments        Pertinent Vitals/Pain Pain Assessment: No/denies pain Faces Pain Scale: No hurt    Home Living                      Prior Function            PT Goals (current goals can now be found in the care plan section) Acute Rehab PT Goals Patient Stated Goal: to drink unthickened water PT Goal Formulation: With patient Time For Goal Achievement: 05/23/18 Potential to Achieve Goals: Fair    Frequency    Min 2X/week      PT Plan  Co-evaluation   Reason for Co-Treatment: To address functional/ADL transfers;For patient/therapist safety;Necessary to address cognition/behavior during functional activity PT goals addressed during session: Mobility/safety with mobility        AM-PAC PT "6 Clicks" Mobility   Outcome Measure  Help needed turning from your back to your side while in a flat bed without using bedrails?: A Lot Help needed moving from lying on your back to sitting on the side of a flat bed without using bedrails?: A Lot Help needed moving to and from a bed to a chair (including a wheelchair)?: Total Help needed standing up from a chair using your  arms (e.g., wheelchair or bedside chair)?: Total Help needed to walk in hospital room?: Total Help needed climbing 3-5 steps with a railing? : Total 6 Click Score: 8    End of Session Equipment Utilized During Treatment: Gait belt;Oxygen Activity Tolerance: Treatment limited secondary to agitation;Patient limited by fatigue Patient left: in chair;with chair alarm set;with call bell/phone within reach Nurse Communication: Other (comment)(RN reports MD wants pt on RA) PT Visit Diagnosis: Muscle weakness (generalized) (M62.81);Difficulty in walking, not elsewhere classified (R26.2)     Time: 3212-2482 PT Time Calculation (min) (ACUTE ONLY): 34 min  Charges:  $Therapeutic Exercise: 8-22 mins $Therapeutic Activity: 8-22 mins                     Selinda Eon PT, DPT Acute Rehab [phone]

## 2018-05-19 NOTE — TOC Transition Note (Signed)
Transition of Care [facility]) - CM/SW Discharge Note   Patient Details  Name: Shannon Carroll MRN: [id number] Date of Birth: 1950-07-04  [facility] of [facility] CM/SW Contact:  Candie Chroman, LCSW Phone Number: 05/19/2018, 3:36 PM   Clinical Narrative: CSW facilitated patient discharge including contacting patient family and facility to confirm patient discharge plans. Clinical information faxed to facility and family agreeable with plan. CSW arranged ambulance transport via [facility] to [place]. RN to call report prior to discharge [phone] Room 106P).  CSW will sign off for now as social work intervention is no longer needed. Please consult [place] again if new needs arise.    Final next level of care: Skilled Nursing Facility Barriers to Discharge: Barriers Resolved   Patient Goals and CMS Choice        Discharge Placement PASRR number recieved: 05/09/18            Patient chooses bed at: [facility] Patient to be transferred to facility by: [place] Name of family member notified: Rogue Jury Patient and family notified of of transfer: 05/19/18  Discharge Plan and Services                          Social Determinants of Health (SDOH) Interventions     Readmission Risk Interventions No flowsheet data found.

## 2018-05-19 NOTE — Progress Notes (Addendum)
Nutrition Follow-up  RD working remotely.  DOCUMENTATION CODES:   Morbid obesity  INTERVENTION:   -Continue MVI with minerals daily -Continue 30 ml Prostat BID, each supplement provides 100 kcals and 15 grams protein  NUTRITION DIAGNOSIS:   Increased nutrient needs related to wound healing as evidenced by estimated needs.  Ongoing  GOAL:   Patient will meet greater than or equal to 90% of their needs  Progressing  MONITOR:   PO intake, Supplement acceptance, Diet advancement, Labs, Weight trends, Skin, I & O's  REASON FOR ASSESSMENT:   LOS    ASSESSMENT:   Shannon Carroll is a 68 y.o. female with medical history significant of hypertension, hyperlipidemia, diet-controlled diabetes, stroke, depression, anxiety, CKD-3, dCHF, OSA not on CPAP, mitral valve stenosis, aortic valve stenosis, who presents with shortness of breath and bilateral leg edema.  3/19- s/p CWOCN consult- full thickness wound on lt lateral LE with green drainage 3/26- RN observed pt choking with food and liquids; NPO 3/27- s/p BSE- SLP recommending regular consistency diet with nectar thickened liquids 3/28- s/p repeat BSE- continue with nectar thick liquids with water between meals after performing oral care  Reviewed I/O's: -550 ml x 24 hours and -11.1 L since admission  UOP: 2 L x 24 hours  Chart reviewed; noted that pt easily becomes frustrated and easily distracted. She has been refusing some medications and nectar thickened liquids. SLP has been working with pt to potential upgrade- currently pt allowed thin liquids between meals after completion of oral care. Pt would like to accept the risk of aspiration and consume thin liquids, however, SLP is uncertain if pt truly understands risk.   Pt continues to have a good appetite; meal completion 75-100%.   Per MD notes, likely discharge back to SNF within the next 24-48 hours.   Labs reviewed.   Diet Order:   Diet Order            Diet heart  healthy/carb modified Room service appropriate? Yes with Assist; Fluid consistency: Nectar Thick; Fluid restriction: 1200 mL Fluid  Diet effective now              EDUCATION NEEDS:   No education needs have been identified at this time  Skin:  Skin Assessment: Skin Integrity Issues: Skin Integrity Issues:: Other (Comment) Other: MASD to breast and groin, full thickness wound to lt lateral LE  Last BM:  05/18/18  Height:   Ht Readings from Last 1 Encounters:  05/06/18 5\' 8"  (1.727 m)    Weight:   Wt Readings from Last 1 Encounters:  05/19/18 131.9 kg    Ideal Body Weight:  63.6 kg  BMI:  Body mass index is 44.21 kg/m.  Estimated Nutritional Needs:   Kcal:  1900-2100  Protein:  95-110 grams  Fluid:  1.9-2.1 L    Terriann Difonzo A. Jimmye Norman, RD, LDN, [place] Registered Dietitian II Certified Diabetes Care and Education Specialist Pager: [phone] After hours Pager: [phone]

## 2018-05-19 NOTE — Discharge Summary (Signed)
Physician Discharge Summary  Shannon Carroll ZDG:[id number] DOB: 1950/02/26 DOA: 05/06/2018  PCP: Harlan Stains, MD  Admit date: 05/06/2018 Discharge date: 05/19/2018  Admitted From: Home Disposition: SNF  Recommendations for Outpatient Follow-up:  1. Follow up with PCP in 1-2 weeks 2. Follow up with Cardiology in 1-2 weeks 3. Follow up with Neurology in 1-2 weeks 4. Recommend Referral to Pulmonary for Sleep Study and Evaluation for suspected OSA/OHS 5. Repeat CXR in a week  6. SLP to Evaluate at SNF 7. Please obtain CMP/CBC, Mag, Phos in one week 8. Please follow up on the following pending results:  Home Health: No Equipment/Devices: None recommended by PT  Discharge Condition: Stable CODE STATUS: FULL CODE Diet recommendation: Per SLP Heart Healthy Carb Modified Diet with NECTAR THICK LIQUIDS  Brief/Interim Summary: The patient is a 68 year old female with a history of hypertension, hyperlipidemia, diet-controlled diabetes mellitus, stroke, depression, CKD stage III, diastolic CHF, aortic valve stenosis and other comorbidities who presented with shortness of breath and bilateral lower extremity edema. Patient admitted with acute on chronic diastolic CHF. She has been started on Diuresis and has improved significantly but Renal Function started worsening so Cardiology decreased Diuresis yesterday and Cr slightly improved. Hospitalization has also been complicated by Abnormal LFT's so workup has been initiated with RUQ U/S and Acute Hepatitis Panel which has been unremarkable. Also has been complicated by ?Aspiration and Dysphagia and SLP recommending Regular Diet with Nectar Thick Liquids but patient refusing them but SLP feels she needs Nectar Thick Liquids. She has been intermittently confused and somnolent during hospitalization.  Patient continues to request water without thickening and decided to drink it last night that the thickener and ended up coughing and choking up with then a  heart rate was sustaining of 130s for almost 20 minutes yesterday. She was advised to adhere to recommendations and risks and benefits of taking thickened liquids was went over with her and I told her that if she is at a higher risk for aspiration and worsening Respiratory wise. SLP continuing to work with her but patient is agitated and refusing certain aspects of her care. Continues to refuse Nectar Thick Liquids and wants to drink Regular Water with Risk of Aspiration.   The patient was much more calm and willing to take Nectar Thick Liquids. She improved from a Heart Failure Standpoint after being diuresed.  She had a home O2 screen done prior to discharge and oxygen was removed but she desaturated to 87% so will be sent to skilled nursing facility on 2 L supplemental oxygen via nasal cannula.  She improved significantly and will need to follow-up with PCP, cardiology as well as neurology in the outpatient setting.  Speech therapy is to follow the patient at facility as well  Discharge Diagnoses:  Principal Problem:   Acute on chronic diastolic CHF (congestive heart failure) (HCC) Active Problems:   Hyperlipidemia   Depression   Hypertension   Stroke ([place])   CKD (chronic kidney disease), stage III (HCC)   Normocytic anemia   Hypokalemia   Acute on chronic diastolic (congestive) heart failure (HCC)   Abnormal LFTs   Acute on chronic congestive heart failure (HCC)   SOB (shortness of breath)   Hyperbilirubinemia  Acute Respiratory Failure with Hypoxia 2/2 to Diastolic CHF, improving  -See As below -Continuous Pulse Oximetry and Maintain O2 Saturations >90% -Continue Supplemental O2 via [place] and Wean O2 as tolerated -Cardiology recommending increasing po Lasix to 80 mg po BID and will continue  at D/C; Decided to take medications today in Nectar Thick Water   -Will need Home Ambulatory Screen Prior to D/C -C/w Dextromethorphan-Guaifenesin 1 tab po BIDprn Cough -Added Xopenex/Atrovent q6h  and reduced to TID; -Added Budesonide and Add Solumedrol IV 60 mg q12h given her wheezing and will continue today  -ABG done 05/15/2018 because of Somnolence and showed 7.542/54.2/125/37.3/98.8% on 2 Liters -Repeat CXR this AM showed "Decreased prominence of CHF since the prior chest x-ray. Asymmetric vascular congestion versus atelectasis in the right upper lung. Left basilar atelectasis with probable component of left pleural fluid." -Will get OOB to Chair and Ambulate with Assistance; PT/OT recommending SNF -Repeat CXR at SNF  -Wean O2 as tolerated and will need an Ambulatory Home O2 Screen prior to D/C and still requires O2 via [place] at 2 Liters   Acute on Chronic Diastolic CHF -Slowly improving but slightly worsened today, patient presented with dyspnea, leg edema, BNP 1137 on admission and repeat was 1,077. -There was Vascular congestion on chest x-ray.  -2D echo in December 2019 showed grade 2 diastolic dysfunction.  -Strict I's/O's, Daily Weights, and Fluid Restriction to 1500 mL -Cardiologyfeels that patient has right heart failure andhad restarted Lasix 80 mg every 12 hours along with Demadex but now Demadex has been discontinued and Lasix had been reduced to IV 40 mg BID; Renal Fxn worsened so it was changed to po 40 mg BID per Cardiology yesterday and now titrated up to 80 mg BID; Will get an additional Dose of IV Lasix  40 mg again this AM  -Patient is -25 lbs from admission and is also -11.038 Liters -Continue to Monitor Volume Status Carefully  -Repeat CXR this AM showed "Decreased prominence of CHF since the prior chest x-ray. Asymmetric vascular congestion versus atelectasis in the right upper lung. Left basilar atelectasis with probable component of left pleural fluid." -Repeat CXR at SNF -Continue Diuresis per Cardiology and Continue to Monitor Volume Status carefully   Hyperlipidemia -Discontinue Atorvastatin 40 mg po daily given Abnormal LFTs -Resume in the outpatient  setting with PCP   Hypertension -Blood pressure is stable and is 114/68 -Not on Amlodipine while currently hospitalized -C/w IV Diuresis per Cardiology as above -C/w Metoprolol Succinate 12.5 mg po Daily  -C/w IV Hydralazine 5 mg IV q2hprn for SBP>175  Depression/Anxiety -Continue Bupropion 150 mg po Daily and Clonazepam 0.5 mg po BIDprn Anxiety -Also will continue with Hydroxyzine 10 mg po Daily PRN -C/w Venlafaxine 112.5 mg po Daily with Breakfast and 37.5 mg po daily with Supper  History of CVA -C/w Clopidogrel 75 mg po Daily; -Holding Atorvastatin 40 mg po Daily because of Abnormal LFTs  AKI on CKD stage III -Creatinine normally at baseline 1.0-1.3. Creatinine had worsened and was 1.71 andSecondary to diuresis.  -Cardiology adjusted po Lasix and increased it to 80 mg BID and will continue at D/C  -BUN/Cr is 32/1.22 -Continue to Monitor and Trend Renal Function as she is being diuresed  -Repeat CMP in AM   Hypokalemia -Improved. K+ is now 3.8 -Continue to Monitor and Replete with po KCl 20 mEQ BID while being diuresed  -Repeat CMP in AM   Normocytic Anemia -Patient's Hgb/Hct stable at 9.0/30.3 today  -Anemia panel on admission showed an iron level of 21, U IBC of 200, TIBC of 221, saturation ratios of 9%, ferritin level 77, folate level 21.7, and vitamin B12 of 686 -Continue with Niferex 150 mg po Daily  -Continue to monitor for signs and symptoms of bleeding -  Repeat CMP in a.m.  ?UTI/Asymptomatic Bacteriuria -Initial U/A showed Clear color appearance, strong color urine, positive nitrites, few bacteria, and 0-5 WBCs -This was repeated on 323 -Patient had abnormal UA and, with no Dysuria currently but ? If she did have dysuria previously -Urine culture has been ordered and showed >100,000 CFU of E Coli that was only resistant to Ampicillin  -S/p 5 Day Treatment Course with IV Ceftriaxone and completed course  SVT -Patient had episode of SVT 2 days ago,  currently she is in normal sinus rhythm. No more episodes of SVT.  -Continue Metoprolol Succinate 12.5 mg po Daily and IV Lopressor has been stopped  -C/w Telemetry while hospitalized   Morbid Obesity -Estimated body mass index is 44.21 kg/m as calculated from the following:   Height as of this encounter: _0  (1.727 m).   Weight as of this encounter: 131.9 kg. -Weight Loss and Dietary Counseling Given   Mitral Valve and Aortic Valve Stenosis -Per Cardiology will need Follow up ECHOes in the Future  Pulmonary HTN -Likely related to Valvular Heart Disease as well as OSA/OHS -Diuresis per Cardiology as above; Now on po as above -Breathing Treatments as above as well   Suspected OSA -Needs outpatient Sleep Study -Will add CPAP qHS while hospitalized -Will need Pulmonary Evaluation at D/C in the outpatient setting   Increased AG; Now having Metabolic Alkalosis in the setting of Diuresis  -Patient's AG was 16 and now improved to 8; CO2 is 38 -In the setting of Diuresis  Hyperglycemia in the setting of Diet Controlled Diabetes -Patient's CBG ranging from 101-161  -Checked HbA1c last week was 5.2 -Continue to Monitor and If blood Sugars consistently elevated then will place on Sensitive Novolog SSI AC  Abnormal LFT's, improving  -Patient's LFTs were normal on 3/18 and severely worsened; Likely from Hepatic Congestion in the setting of CHF -AST went from 29 -> 1,049 and ALT went from 18 -> 654 -Now AST is 114 and ALT is 157 -? If Patient has NASH given her Body habitus  -Discontinued Atorvastatin and Acetaminophen and Not complaining of any Abdominal Pain -Is on Heparin 5,000 units sq q8h and was changed yesterday from Enoxaparin -Checked GGT and was 130 (In the setting of CHF) and Lipase Level (34)  -Checked RUQ U/S and showed Mild hepatic steatosis.No gallstones. No biliary dilatation. Small volume of ascites. Right pleural effusion -Acute Hepatitis Panel Negative   -Continue to Monitor and Trend Hepatic Fxn -Repeat CMP  Hyperbilirubinemia  -Patient's T Bili was 1.6 and improved slightly to 1.1 -Checked RUQ U/S and Acute Hepatitis Panel as above -Continue to Monitor and Repeat CMP at SNF  ?Aspiration/Dysphagia -Had some choking and coughing and continues to have it.  -Made NPO and SLP evaluated -SLP recommending Regular Diet with Nectar Thick Liquids but patient has been refusing Nectar Thick Liquids; Will have SLP re-evaluate and still recommending [place] -A very lengthy discussion with the patient about adhering to the recommendations recommended by the speech therapist as she is at high risk for aspiration. Risks and benefits were again discussed with the patient and patient does not want nectar thick liquids and states that she will still drink water normally.  Yesterday she had an episode which he drank regular water and had a choking episode and started coughing and heart rate went to the 130s.  She will continue to drink water the way she wants despite my medical recommendations. -Added Xopenex/Atrovent for breathing and will continue at  SNF -Continue to Monitor closely  -Repeat Chest x-ray at SNF  Intermittent Confusion/Delirum; Concern for some Dementia -Initially Was In the setting of Hypoxia but family states that she has been intermittently confused after her strokes -C/w Supplemental O2 via [place] and wean O2 as tolerated  -Will place on Delirium Precautions while hospitalized  -Need to closely Monitor while she is on Wellbutrin XL and Venlafaxine -Continue to Monitor closely and recommend outpatient Neuro-Cognitive Testing and follow up with Neurology Dr. Dia Sitter   Somnolence, improved -Checked ABG and as above -Had worsening respiratory Symptoms with Wheezing ad likely from Pulmonary Edema and still wheezing and somnolent this AM but easily arousable -Will order CPAP and will need an outpatient sleep study -Continue to  Monitor Closely and C/w O2  Hypophosphatemia -Patient's Phos Level this AM was 2.4 and repeat wa 3.4 -Continue to Monitor and Replete as Necessary -Repeat Phos Level at SNF  Leukopenia, improved  -Mild and WBC went from 9.7 -> 3.9 -> 6.9 -> 8.7 -Continue to monitor as patient is on IV steroids and suspecting aspiration -Repeat CBC in AM   Discharge Instructions Discharge Instructions    (HEART FAILURE PATIENTS) Call MD:  Anytime you have any of the following symptoms: 1) 3 pound weight gain in 24 hours or 5 pounds in 1 week 2) shortness of breath, with or without a dry hacking cough 3) swelling in the hands, feet or stomach 4) if you have to sleep on extra pillows at night in order to breathe.   Complete by:  As directed    Call MD for:  difficulty breathing, headache or visual disturbances   Complete by:  As directed    Call MD for:  extreme fatigue   Complete by:  As directed    Call MD for:  hives   Complete by:  As directed    Call MD for:  persistant dizziness or light-headedness   Complete by:  As directed    Call MD for:  persistant nausea and vomiting   Complete by:  As directed    Call MD for:  redness, tenderness, or signs of infection (pain, swelling, redness, odor or green/yellow discharge around incision site)   Complete by:  As directed    Call MD for:  severe uncontrolled pain   Complete by:  As directed    Call MD for:  temperature >100.4   Complete by:  As directed    Diet - low sodium heart healthy   Complete by:  As directed    [place]   Discharge instructions   Complete by:  As directed    You were cared for by a hospitalist during your hospital stay. If you have any questions about your discharge medications or the care you received while you were in the hospital after you are discharged, you can call the unit and ask to speak with the hospitalist on call if the hospitalist that took care of you is not available. Once you are discharged, your  primary care physician will handle any further medical issues. Please note that NO REFILLS for any discharge medications will be authorized once you are discharged, as it is imperative that you return to your primary care physician (or establish a relationship with a primary care physician if you do not have one) for your aftercare needs so that they can reassess your need for medications and monitor your lab values.  Follow up with PCP, Cardiology, Neurology, and Pulmonary in the  outpatient setting. Take all medications as prescribed. If symptoms change or worsen please return to the ED for evaluation   Increase activity slowly   Complete by:  As directed      Allergies as of 05/19/2018      Reactions   Lisinopril Cough      Medication List    STOP taking these medications   amLODipine 2.5 MG tablet Commonly known as:  NORVASC   atorvastatin 40 MG tablet Commonly known as:  LIPITOR     TAKE these medications   buPROPion 150 MG 24 hr tablet Commonly known as:  WELLBUTRIN XL Take 150 mg by mouth daily.   CENTRUM SILVER PO Take by mouth.   clonazePAM 0.5 MG tablet Commonly known as:  KLONOPIN Take 1 tablet (0.5 mg total) by mouth 2 (two) times daily as needed for anxiety.   clopidogrel 75 MG tablet Commonly known as:  Plavix Take 1 tablet (75 mg total) by mouth daily.   dextromethorphan-guaiFENesin 30-600 MG 12hr tablet Commonly known as:  MUCINEX DM Take 1 tablet by mouth 2 (two) times daily as needed for cough.   feeding supplement (PRO-STAT SUGAR FREE 64) Liqd Take 30 mLs by mouth 2 (two) times daily.   furosemide 80 MG tablet Commonly known as:  LASIX Take 1 tablet (80 mg total) by mouth 2 (two) times daily. What changed:    medication strength  how much to take  when to take this  reasons to take this  Another medication with the same name was removed. Continue taking this medication, and follow the directions you see here.   hydrOXYzine 10 MG  tablet Commonly known as:  ATARAX/VISTARIL Take 1 tablet (10 mg total) by mouth 3 (three) times daily as needed for nausea.   ipratropium 0.02 % nebulizer solution Commonly known as:  ATROVENT Take 2.5 mLs (0.5 mg total) by nebulization every 6 (six) hours as needed for wheezing or shortness of breath.   iron polysaccharides 150 MG capsule Commonly known as:  NIFEREX Take 150 mg by mouth daily.   levalbuterol 0.63 MG/3ML nebulizer solution Commonly known as:  XOPENEX Take 3 mLs (0.63 mg total) by nebulization every 6 (six) hours as needed for wheezing or shortness of breath.   metFORMIN 1000 MG tablet Commonly known as:  GLUCOPHAGE Take 1,000 mg by mouth 2 (two) times daily with a meal.   metoprolol succinate 25 MG 24 hr tablet Commonly known as:  TOPROL-XL Take 0.5 tablets (12.5 mg total) by mouth daily. Start taking on:  May 20, 2018   Myrbetriq 25 MG Tb24 tablet Generic drug:  mirabegron ER Take 25 mg by mouth daily.   nystatin powder Generic drug:  nystatin Apply 1 g topically 2 (two) times daily as needed (rash).   omeprazole 40 MG capsule Commonly known as:  PRILOSEC TAKE (1) CAPSULE BY MOUTH AT BEDTIME. What changed:  See the new instructions.   potassium chloride SA 20 MEQ tablet Commonly known as:  K-DUR,KLOR-CON Take 1 tablet (20 mEq total) by mouth 2 (two) times daily.   predniSONE 10 MG (21) Tbpk tablet Commonly known as:  STERAPRED UNI-PAK 21 TAB Take 6 tablets on day 1, 5 tablets on day 2, 4 tablets on day 3, 3 tablets on day 4, 2 tablets on day 5, 1 tablet on day 6, and then stop on day 7.   Resource ThickenUp Clear Powd Take 120 g by mouth as needed.   senna-docusate 8.6-50 MG tablet Commonly  known as:  Senokot-S Take 1 tablet by mouth 2 (two) times daily.   venlafaxine 37.5 MG tablet Commonly known as:  EFFEXOR Take 37.5-75 mg by mouth See admin instructions. Take 2 tablets (112.5 mg) by mouth in the morning & take 1 tablet (37.5 mg) by mouth  at night.            Durable Medical Equipment  (From admission, onward)         Start     Ordered   05/19/18 1418  DME Oxygen  Once    Question Answer Comment  Mode or (Route) Nasal cannula   Liters per Minute 2   Frequency Continuous (stationary and portable oxygen unit needed)   Oxygen delivery system Gas      05/19/18 1419          Contact information for follow-up providers    Harlan Stains, MD. Call.   Specialty:  Family Medicine Contact information: [address] [place] [address] [phone]        Minus Breeding, MD .   Specialty:  Cardiology Contact information: [address] [place] [address] [phone]        Star Age, MD. Call.   Specialties:  Neurology, Radiology Why:  Follow up in 1 week Contact information: [address] [place] [address] [phone]        [facility]. Call.   Specialty:  Pulmonology Why:  Call for Outpatient Sleep Study and Evaluation for Suspected OSA/OHS Contact information: [facility] [place] [address] [phone]           Contact information for after-discharge care    Destination    [facility]erred [facility] .   Service:  Skilled Nursing Contact information: [place] [address] [phone]                 Allergies  Allergen Reactions  . Lisinopril Cough   Consultations:  Cardiology  Procedures/Studies: Dg Chest Port 1 View  Result Date: 05/19/2018 CLINICAL DATA:  Respiratory failure and congestive heart failure. EXAM: PORTABLE CHEST 1 VIEW COMPARISON:  05/18/2018 FINDINGS: Stable cardiac enlargement. Slight diminishment in CHF since the prior study. There is some asymmetric vascular congestion versus atelectasis in the right upper lung. Stable left basilar atelectasis and probable component of small to moderate left pleural effusion.  IMPRESSION: Decreased prominence of CHF since the prior chest x-ray. Asymmetric vascular congestion versus atelectasis in the right upper lung. Left basilar atelectasis with probable component of left pleural fluid. Electronically Signed   By: Aletta Edouard M.D.   On: 05/19/2018 08:27   Dg Chest Port 1 View  Result Date: 05/18/2018 CLINICAL DATA:  Short of breath EXAM: PORTABLE CHEST 1 VIEW COMPARISON:  05/17/2018 FINDINGS: Stable enlarged cardiac silhouette. Bilateral pleural effusions similar prior. A central venous congestion. No focal consolidation. No pneumothorax. IMPRESSION: Cardiomegaly with small bilateral pleural effusions. Concern for mild congestive heart failure Electronically Signed   By: Suzy Bouchard M.D.   On: 05/18/2018 08:16   Dg Chest Port 1 View  Result Date: 05/17/2018 CLINICAL DATA:  Shortness of breath EXAM: PORTABLE CHEST 1 VIEW COMPARISON:  05/16/2018 FINDINGS: Cardiomegaly with mild perihilar edema and small bilateral pleural effusions, grossly unchanged. No pneumothorax. IMPRESSION: Cardiomegaly mild interstitial edema and small bilateral pleural effusions, grossly unchanged. Electronically Signed   By: Julian Hy M.D.   On: 05/17/2018 08:11  Dg Chest Port 1 View  Result Date: 05/16/2018 CLINICAL DATA:  Shortness of breath EXAM: PORTABLE CHEST 1 VIEW COMPARISON:  05/15/2018 FINDINGS: Cardiomegaly with mild perihilar edema. Small to moderate bilateral pleural effusions, increased. No pneumothorax. IMPRESSION: Cardiomegaly with mild perihilar edema and small to moderate bilateral pleural effusions, increased. Electronically Signed   By: Julian Hy M.D.   On: 05/16/2018 07:41   Dg Chest Port 1 View  Result Date: 05/15/2018 CLINICAL DATA:  Shortness of breath EXAM: PORTABLE CHEST 1 VIEW COMPARISON:  May 14, 2018 FINDINGS: There is persistent cardiomegaly. Pulmonary vascularity is within normal limits. Currently there is no appreciable edema or  consolidation. No adenopathy. No bone lesions. There is aortic atherosclerosis. IMPRESSION: Cardiomegaly. No edema or consolidation. Pulmonary vascularity within normal limits. Aortic Atherosclerosis (ICD10-I70.0). Electronically Signed   By: Lowella Grip III M.D.   On: 05/15/2018 07:35   Dg Chest Port 1 View  Result Date: 05/14/2018 CLINICAL DATA:  Aspiration EXAM: PORTABLE CHEST 1 VIEW COMPARISON:  May 14, 2018 study obtained earlier in the day FINDINGS: There is cardiomegaly with mild pulmonary venous hypertension. There are pleural effusions bilaterally. There is no appreciable edema or consolidation. There is aortic atherosclerosis. No adenopathy. No bone lesions. IMPRESSION: Pulmonary vascular congestion with small pleural effusions bilaterally. No frank edema or consolidation. Aortic Atherosclerosis (ICD10-I70.0). Electronically Signed   By: Lowella Grip III M.D.   On: 05/14/2018 16:00   Dg Chest Port 1 View  Result Date: 05/14/2018 CLINICAL DATA:  Short of breath EXAM: PORTABLE CHEST 1 VIEW COMPARISON:  05/06/2018 FINDINGS: Cardiac enlargement with mild progression of vascular congestion. Progression of bibasilar atelectasis and small effusions. Atherosclerotic ascending aorta and aortic arch. IMPRESSION: Congestive heart failure with mild progression. Electronically Signed   By: Franchot Gallo M.D.   On: 05/14/2018 08:31   Dg Chest Port 1 View  Result Date: 05/06/2018 CLINICAL DATA:  Dyspnea. Bilateral leg swelling. EXAM: PORTABLE CHEST 1 VIEW COMPARISON:  01/21/2018. FINDINGS: Poor inspiration. No gross change in borderline enlargement of the cardiac silhouette and mild prominence of the pulmonary vasculature and interstitial markings. No visible pleural fluid. Thoracic spine degenerative changes. IMPRESSION: Stable borderline cardiomegaly, mild pulmonary vascular congestion and mild chronic interstitial lung disease. Electronically Signed   By: Claudie Revering M.D.   On: 05/06/2018  18:07   US Abdomen Limited Ruq  Result Date: 05/14/2018 CLINICAL DATA:  Elevated LFTs. EXAM: ULTRASOUND ABDOMEN LIMITED RIGHT UPPER QUADRANT COMPARISON:  None. FINDINGS: Gallbladder: Physiologically distended. No gallstones. No significant gallbladder wall thickening. No sonographic Murphy sign noted by sonographer. Common bile duct: Diameter: 5 mm. Liver: No focal lesion identified. Diffusely heterogeneous and slightly increased in parenchymal echogenicity. Portal vein is patent on color Doppler imaging with normal direction of blood flow towards the liver. Small amount of right upper quadrant ascites. Right pleural effusion noted. IMPRESSION: 1. Mild hepatic steatosis. 2. No gallstones.  No biliary dilatation. 3. Small volume of ascites.  Right pleural effusion. Electronically Signed   By: Keith Rake M.D.   On: 05/14/2018 22:18   ECHOCARDIOGRAM 05/07/2018 IMPRESSIONS   1. The left ventricle has hyperdynamic systolic function, with an ejection fraction of >65%. The cavity size was normal. There is mildly increased left ventricular wall thickness. Left ventricular diastolic Doppler parameters are indeterminate.  Indeterminate filling pressures There is incoordinate septal motion. 2. The right ventricle has mildly reduced systolic function. The cavity was mildly enlarged. There is no increase in right ventricular wall thickness. 3. Left atrial size  was moderately dilated. 4. Right atrial size was mildly dilated. 5. The mitral valve is degenerative. Mild thickening of the mitral valve leaflet. Moderate calcification of the mitral valve leaflet. There is moderate to severe mitral annular calcification present. Moderate mitral valve stenosis. 6. The tricuspid valve is grossly normal. 7. The aortic valve was not well visualized. 8. The aortic root and ascending aorta are normal in size and structure. 9. The inferior vena cava was dilated in size with <50% respiratory variability. 10.  The interatrial septum was not well visualized. 11. When compared to the prior study: 01/29/2018: LVEF 65-70%, moderate LAE, moderate mitral stenosis.  SUMMARY  LVEF 65-70%, incoordinate septal motion, mild LVH, mildly reduced RV systolic function, moderate LAE, mild RA, heavy MAC with moderate mitral stenosis and trivial MR, mild TR, RVSP 53 mmHg, dilated IVC FINDINGS Left Ventricle: The left ventricle has hyperdynamic systolic function, with an ejection fraction of >65%. The cavity size was normal. There is mildly increased left ventricular wall thickness. Left ventricular diastolic Doppler parameters are  indeterminate. Indeterminate filling pressures There is incoordinate septal motion. Definity contrast agent was given IV to delineate the left ventricular endocardial borders. Right Ventricle: The right ventricle has mildly reduced systolic function. The cavity was mildly enlarged. There is no increase in right ventricular wall thickness. Left Atrium: left atrial size was moderately dilated Right Atrium: right atrial size was mildly dilated. Right atrial pressure is estimated at 15 mmHg. Interatrial Septum: The interatrial septum was not well visualized. Pericardium: There is no evidence of pericardial effusion. Mitral Valve: The mitral valve is degenerative in appearance. Mild thickening of the mitral valve leaflet. Moderate calcification of the mitral valve leaflet. There is moderate to severe mitral annular calcification present. Mitral valve regurgitation is trivial by color flow Doppler. Moderate mitral valve stenosis. Tricuspid Valve: The tricuspid valve is grossly normal. Tricuspid valve regurgitation is mild by color flow Doppler. Aortic Valve: The aortic valve was not well visualized Aortic valve regurgitation was not visualized by color flow Doppler. There is no evidence of aortic valve stenosis. Pulmonic Valve: The pulmonic valve was not well visualized. Pulmonic valve  regurgitation is not visualized by color flow Doppler. Aorta: The aortic root and ascending aorta are normal in size and structure. Venous: The inferior vena cava measures 2.60 cm, is dilated in size with less than 50% respiratory variability. Compared to previous exam: 01/29/2018: LVEF 65-70%, moderate LAE, moderate mitral stenosis.  LEFT VENTRICLE PLAX 2D LVIDd: 4.59 cm LVIDs: 3.51 cm LV PW: 1.14 cm LV IVS: 1.05 cm LVOT diam: 2.20 cm LV SV: 46 ml LV SV Index: 17.09 LVOT Area: 3.80 cm  RIGHT VENTRICLE RVSP: 52.9 mmHg  LEFT ATRIUM Index RIGHT ATRIUM LA diam: 3.60 cm 1.46 cm/m RA Pressure: 15 mmHg AORTIC VALVE LVOT Vmax: 56.20 cm/s LVOT Vmean: 36.900 cm/s LVOT VTI: 0.100 m  AORTA Ao Root diam: 2.60 cm  MITRAL VALVE TRICUSPID VALVE MV Peak grad: 20.8 mmHg TR Peak grad: 37.9 mmHg MV Mean grad: 8.0 mmHg TR Vmax: 308.00 cm/s MV Vmax: 2.28 m/s RVSP: 52.9 mmHg MV Vmean: 135.0 cm/s MV VTI: 0.51 m SHUNTS Systemic VTI: 0.10 m Systemic Diam: 2.20 cm  IVC IVC diam: 2.60 cm  Subjective:    Discharge Exam: Vitals:   05/19/18 0919 05/19/18 1215  BP: 114/75 114/68  Pulse: 84 84  Resp: 20 19  Temp: 98.8 F (37.1 C) (!) 97.3 F (36.3 C)  SpO2: 98% 95%   Vitals:   05/19/18 0446 05/19/18 0753  05/19/18 0919 05/19/18 1215  BP: (!) 135/100  114/75 114/68  Pulse: 78  84 84  Resp: _0 Temp: (!) 97.5 F (36.4 C)  98.8 F (37.1 C) (!) 97.3 F (36.3 C)  TempSrc: Oral  Oral Oral  SpO2: 100% 98% 98% 95%  Weight: 131.9 kg     Height:       General: Pt is alert, awake, not in acute distress Cardiovascular: RRR, S1/S2 +, no rubs, no gallops Respiratory: Diminished bilaterally, no wheezing, no rhonchi; Has some crackles and needs 2 Liters of Supplemental O2 via [place] Abdominal:  Soft, NT, Distended due to body habitus, bowel sounds + Extremities: Legs wrapped in UNNA boots and has trace edema, no cyanosis  The results of significant diagnostics from this hospitalization (including imaging, microbiology, ancillary and laboratory) are listed below for reference.    Microbiology: Recent Results (from the past 240 hour(s))  Culture, Urine     Status: Abnormal   Collection Time: 05/11/18 10:03 AM  Result Value Ref Range Status   Specimen Description URINE, RANDOM  Final   Special Requests   Final    NONE Performed at [facility], [address], [place], [place] [address]    Culture >=100,000 COLONIES/mL ESCHERICHIA COLI (A)  Final   Report Status 05/13/2018 FINAL  Final   Organism ID, Bacteria ESCHERICHIA COLI (A)  Final      Susceptibility   Escherichia coli - MIC*    AMPICILLIN 16 INTERMEDIATE Intermediate     CEFAZOLIN <=4 SENSITIVE Sensitive     CEFTRIAXONE <=1 SENSITIVE Sensitive     CIPROFLOXACIN <=0.25 SENSITIVE Sensitive     GENTAMICIN <=1 SENSITIVE Sensitive     IMIPENEM <=0.25 SENSITIVE Sensitive     NITROFURANTOIN <=16 SENSITIVE Sensitive     TRIMETH/SULFA <=20 SENSITIVE Sensitive     AMPICILLIN/SULBACTAM 8 SENSITIVE Sensitive     PIP/TAZO 8 SENSITIVE Sensitive     Extended ESBL NEGATIVE Sensitive     * >=100,000 COLONIES/mL ESCHERICHIA COLI    Labs: BNP (last 3 results) Recent Labs    05/04/18 0953 05/06/18 1752  BNP 1,137.1* 4,827.0*   Basic Metabolic Panel: Recent Labs  Lab 05/15/18 0317 05/16/18 0400 05/17/18 0307 05/18/18 0408 05/19/18 0453  NA 142 144 141 143 139  K 3.1* 3.6 4.0 4.1 3.8  CL 94* 98 100 98 93*  CO2 33* 33* 32 33* 38*  GLUCOSE 101* 106* 161* 157* 155*  BUN 28* 25* 24* 30* 32*  CREATININE 1.51* 1.42* 1.18* 1.18* 1.22*  CALCIUM 8.4* 8.6* 8.3* 8.7* 8.2*  MG 2.1 2.2 2.1 2.2 2.0  PHOS 3.4 2.4* 3.5 4.1 3.4   Liver Function Tests: Recent Labs  Lab 05/15/18 0317 05/16/18 0400 05/17/18 0307  05/18/18 0408 05/19/18 0453  AST 731* 450* 292* 192* 114*  ALT 506* 362* 275* 214* 157*  ALKPHOS 162* 167* 158* 139* 123  BILITOT 1.3* 1.2 1.4* 1.2 1.1  PROT 6.5 6.5 6.6 6.8 6.4*  ALBUMIN 2.8* 2.8* 2.7* 2.8* 2.7*   Recent Labs  Lab 05/14/18 0337  LIPASE 34   No results for input(s): AMMONIA in the last 168 hours. CBC: Recent Labs  Lab 05/15/18 0317 05/16/18 0400 05/17/18 0307 05/18/18 0408 05/19/18 0453  WBC 7.3 6.6 3.9* 6.9 8.7  NEUTROABS 5.9 5.5 3.4 6.2 7.9*  HGB 9.3* 9.2* 9.0* 9.3* 9.0*  HCT 30.9* 32.7* 30.4* 31.7* 30.3*  MCV 88.0 88.4 87.9 88.5 87.8  PLT 183 185 165 184  162   Cardiac Enzymes: No results for input(s): CKTOTAL, CKMB, CKMBINDEX, TROPONINI in the last 168 hours. BNP: Invalid input(s): POCBNP CBG: No results for input(s): GLUCAP in the last 168 hours. D-Dimer No results for input(s): DDIMER in the last 72 hours. Hgb A1c No results for input(s): HGBA1C in the last 72 hours. Lipid Profile No results for input(s): CHOL, HDL, LDLCALC, TRIG, CHOLHDL, LDLDIRECT in the last 72 hours. Thyroid function studies No results for input(s): TSH, T4TOTAL, T3FREE, THYROIDAB in the last 72 hours.  Invalid input(s): FREET3 Anemia work up No results for input(s): VITAMINB12, FOLATE, FERRITIN, TIBC, IRON, RETICCTPCT in the last 72 hours. Urinalysis    Component Value Date/Time   COLORURINE AMBER (A) 05/11/2018 1002   APPEARANCEUR CLEAR 05/11/2018 1002   APPEARANCEUR Cloudy (A) 03/04/2018 1123   LABSPEC 1.021 05/11/2018 1002   PHURINE 5.0 05/11/2018 1002   GLUCOSEU NEGATIVE 05/11/2018 1002   HGBUR NEGATIVE 05/11/2018 1002   BILIRUBINUR NEGATIVE 05/11/2018 1002   BILIRUBINUR Negative 03/04/2018 1123   KETONESUR NEGATIVE 05/11/2018 1002   PROTEINUR 100 (A) 05/11/2018 1002   NITRITE POSITIVE (A) 05/11/2018 1002   LEUKOCYTESUR NEGATIVE 05/11/2018 1002   Sepsis Labs Invalid input(s): PROCALCITONIN,  WBC,  LACTICIDVEN Microbiology Recent Results (from the past  240 hour(s))  Culture, Urine     Status: Abnormal   Collection Time: 05/11/18 10:03 AM  Result Value Ref Range Status   Specimen Description URINE, RANDOM  Final   Special Requests   Final    NONE Performed at [facility], [place] [address], [place], [place] [address]    Culture >=100,000 COLONIES/mL ESCHERICHIA COLI (A)  Final   Report Status 05/13/2018 FINAL  Final   Organism ID, Bacteria ESCHERICHIA COLI (A)  Final      Susceptibility   Escherichia coli - MIC*    AMPICILLIN 16 INTERMEDIATE Intermediate     CEFAZOLIN <=4 SENSITIVE Sensitive     CEFTRIAXONE <=1 SENSITIVE Sensitive     CIPROFLOXACIN <=0.25 SENSITIVE Sensitive     GENTAMICIN <=1 SENSITIVE Sensitive     IMIPENEM <=0.25 SENSITIVE Sensitive     NITROFURANTOIN <=16 SENSITIVE Sensitive     TRIMETH/SULFA <=20 SENSITIVE Sensitive     AMPICILLIN/SULBACTAM 8 SENSITIVE Sensitive     PIP/TAZO 8 SENSITIVE Sensitive     Extended ESBL NEGATIVE Sensitive     * >=100,000 COLONIES/mL ESCHERICHIA COLI   Time coordinating discharge: 35 minutes  SIGNED:  Kerney Elbe, DO [facility] 05/19/2018, 2:21 PM Pager is on AMION  If 7PM-7AM, please contact night-coverage [email or web address] Password [email or web address]

## 2018-05-20 DIAGNOSIS — I5033 Acute on chronic diastolic (congestive) heart failure: Secondary | ICD-10-CM | POA: Diagnosis not present

## 2018-05-20 DIAGNOSIS — I10 Essential (primary) hypertension: Secondary | ICD-10-CM | POA: Diagnosis not present

## 2018-05-20 DIAGNOSIS — E118 Type 2 diabetes mellitus with unspecified complications: Secondary | ICD-10-CM | POA: Diagnosis not present

## 2018-05-20 DIAGNOSIS — J683 Other acute and subacute respiratory conditions due to chemicals, gases, fumes and vapors: Secondary | ICD-10-CM | POA: Diagnosis not present

## 2018-05-22 ENCOUNTER — Encounter (HOSPITAL_COMMUNITY): Payer: Self-pay | Admitting: Emergency Medicine

## 2018-05-22 ENCOUNTER — Emergency Department (HOSPITAL_COMMUNITY): Payer: Medicare Other

## 2018-05-22 ENCOUNTER — Ambulatory Visit: Payer: Medicare Other | Admitting: Cardiology

## 2018-05-22 ENCOUNTER — Inpatient Hospital Stay (HOSPITAL_COMMUNITY)
Admission: EM | Admit: 2018-05-22 | Discharge: 2018-06-19 | DRG: 870 | Disposition: E | Payer: Medicare Other | Attending: Internal Medicine | Admitting: Internal Medicine

## 2018-05-22 ENCOUNTER — Other Ambulatory Visit: Payer: Self-pay

## 2018-05-22 DIAGNOSIS — Z888 Allergy status to other drugs, medicaments and biological substances status: Secondary | ICD-10-CM

## 2018-05-22 DIAGNOSIS — Z66 Do not resuscitate: Secondary | ICD-10-CM | POA: Diagnosis present

## 2018-05-22 DIAGNOSIS — R7989 Other specified abnormal findings of blood chemistry: Secondary | ICD-10-CM | POA: Diagnosis not present

## 2018-05-22 DIAGNOSIS — R918 Other nonspecific abnormal finding of lung field: Secondary | ICD-10-CM | POA: Diagnosis not present

## 2018-05-22 DIAGNOSIS — T501X5A Adverse effect of loop [high-ceiling] diuretics, initial encounter: Secondary | ICD-10-CM | POA: Diagnosis not present

## 2018-05-22 DIAGNOSIS — Z833 Family history of diabetes mellitus: Secondary | ICD-10-CM

## 2018-05-22 DIAGNOSIS — I130 Hypertensive heart and chronic kidney disease with heart failure and stage 1 through stage 4 chronic kidney disease, or unspecified chronic kidney disease: Secondary | ICD-10-CM | POA: Diagnosis present

## 2018-05-22 DIAGNOSIS — F329 Major depressive disorder, single episode, unspecified: Secondary | ICD-10-CM | POA: Diagnosis present

## 2018-05-22 DIAGNOSIS — Z4659 Encounter for fitting and adjustment of other gastrointestinal appliance and device: Secondary | ICD-10-CM

## 2018-05-22 DIAGNOSIS — I952 Hypotension due to drugs: Secondary | ICD-10-CM | POA: Diagnosis not present

## 2018-05-22 DIAGNOSIS — Z6841 Body Mass Index (BMI) 40.0 and over, adult: Secondary | ICD-10-CM | POA: Diagnosis not present

## 2018-05-22 DIAGNOSIS — J1289 Other viral pneumonia: Secondary | ICD-10-CM | POA: Diagnosis present

## 2018-05-22 DIAGNOSIS — R0602 Shortness of breath: Secondary | ICD-10-CM

## 2018-05-22 DIAGNOSIS — Z808 Family history of malignant neoplasm of other organs or systems: Secondary | ICD-10-CM

## 2018-05-22 DIAGNOSIS — I639 Cerebral infarction, unspecified: Secondary | ICD-10-CM | POA: Diagnosis present

## 2018-05-22 DIAGNOSIS — R6889 Other general symptoms and signs: Secondary | ICD-10-CM | POA: Diagnosis not present

## 2018-05-22 DIAGNOSIS — F0150 Vascular dementia without behavioral disturbance: Secondary | ICD-10-CM | POA: Diagnosis present

## 2018-05-22 DIAGNOSIS — G4733 Obstructive sleep apnea (adult) (pediatric): Secondary | ICD-10-CM | POA: Diagnosis present

## 2018-05-22 DIAGNOSIS — E872 Acidosis: Secondary | ICD-10-CM | POA: Diagnosis present

## 2018-05-22 DIAGNOSIS — E785 Hyperlipidemia, unspecified: Secondary | ICD-10-CM | POA: Diagnosis present

## 2018-05-22 DIAGNOSIS — I69311 Memory deficit following cerebral infarction: Secondary | ICD-10-CM

## 2018-05-22 DIAGNOSIS — E1122 Type 2 diabetes mellitus with diabetic chronic kidney disease: Secondary | ICD-10-CM | POA: Diagnosis present

## 2018-05-22 DIAGNOSIS — J9690 Respiratory failure, unspecified, unspecified whether with hypoxia or hypercapnia: Secondary | ICD-10-CM | POA: Diagnosis not present

## 2018-05-22 DIAGNOSIS — J9620 Acute and chronic respiratory failure, unspecified whether with hypoxia or hypercapnia: Secondary | ICD-10-CM | POA: Diagnosis not present

## 2018-05-22 DIAGNOSIS — Z7952 Long term (current) use of systemic steroids: Secondary | ICD-10-CM

## 2018-05-22 DIAGNOSIS — I5033 Acute on chronic diastolic (congestive) heart failure: Secondary | ICD-10-CM | POA: Diagnosis not present

## 2018-05-22 DIAGNOSIS — Z01818 Encounter for other preprocedural examination: Secondary | ICD-10-CM

## 2018-05-22 DIAGNOSIS — Z9911 Dependence on respirator [ventilator] status: Secondary | ICD-10-CM | POA: Diagnosis not present

## 2018-05-22 DIAGNOSIS — Z7902 Long term (current) use of antithrombotics/antiplatelets: Secondary | ICD-10-CM

## 2018-05-22 DIAGNOSIS — I10 Essential (primary) hypertension: Secondary | ICD-10-CM | POA: Diagnosis present

## 2018-05-22 DIAGNOSIS — G9340 Encephalopathy, unspecified: Secondary | ICD-10-CM | POA: Diagnosis not present

## 2018-05-22 DIAGNOSIS — F419 Anxiety disorder, unspecified: Secondary | ICD-10-CM | POA: Diagnosis present

## 2018-05-22 DIAGNOSIS — N179 Acute kidney failure, unspecified: Secondary | ICD-10-CM | POA: Diagnosis present

## 2018-05-22 DIAGNOSIS — E870 Hyperosmolality and hypernatremia: Secondary | ICD-10-CM | POA: Diagnosis not present

## 2018-05-22 DIAGNOSIS — R6521 Severe sepsis with septic shock: Secondary | ICD-10-CM | POA: Diagnosis present

## 2018-05-22 DIAGNOSIS — Z20822 Contact with and (suspected) exposure to covid-19: Secondary | ICD-10-CM

## 2018-05-22 DIAGNOSIS — R1312 Dysphagia, oropharyngeal phase: Secondary | ICD-10-CM | POA: Diagnosis not present

## 2018-05-22 DIAGNOSIS — R6520 Severe sepsis without septic shock: Secondary | ICD-10-CM | POA: Diagnosis not present

## 2018-05-22 DIAGNOSIS — M6281 Muscle weakness (generalized): Secondary | ICD-10-CM | POA: Diagnosis not present

## 2018-05-22 DIAGNOSIS — J690 Pneumonitis due to inhalation of food and vomit: Secondary | ICD-10-CM | POA: Diagnosis present

## 2018-05-22 DIAGNOSIS — D6489 Other specified anemias: Secondary | ICD-10-CM | POA: Diagnosis present

## 2018-05-22 DIAGNOSIS — I248 Other forms of acute ischemic heart disease: Secondary | ICD-10-CM | POA: Diagnosis present

## 2018-05-22 DIAGNOSIS — Z7409 Other reduced mobility: Secondary | ICD-10-CM | POA: Diagnosis not present

## 2018-05-22 DIAGNOSIS — J9600 Acute respiratory failure, unspecified whether with hypoxia or hypercapnia: Secondary | ICD-10-CM | POA: Diagnosis not present

## 2018-05-22 DIAGNOSIS — J9601 Acute respiratory failure with hypoxia: Secondary | ICD-10-CM | POA: Diagnosis not present

## 2018-05-22 DIAGNOSIS — Z7984 Long term (current) use of oral hypoglycemic drugs: Secondary | ICD-10-CM

## 2018-05-22 DIAGNOSIS — N183 Chronic kidney disease, stage 3 unspecified: Secondary | ICD-10-CM | POA: Diagnosis present

## 2018-05-22 DIAGNOSIS — Z79899 Other long term (current) drug therapy: Secondary | ICD-10-CM

## 2018-05-22 DIAGNOSIS — J810 Acute pulmonary edema: Secondary | ICD-10-CM | POA: Diagnosis not present

## 2018-05-22 DIAGNOSIS — M1990 Unspecified osteoarthritis, unspecified site: Secondary | ICD-10-CM | POA: Diagnosis present

## 2018-05-22 DIAGNOSIS — G9341 Metabolic encephalopathy: Secondary | ICD-10-CM | POA: Diagnosis not present

## 2018-05-22 DIAGNOSIS — R0603 Acute respiratory distress: Secondary | ICD-10-CM | POA: Diagnosis not present

## 2018-05-22 DIAGNOSIS — J80 Acute respiratory distress syndrome: Secondary | ICD-10-CM | POA: Diagnosis not present

## 2018-05-22 DIAGNOSIS — K5900 Constipation, unspecified: Secondary | ICD-10-CM | POA: Diagnosis present

## 2018-05-22 DIAGNOSIS — I471 Supraventricular tachycardia, unspecified: Secondary | ICD-10-CM | POA: Diagnosis present

## 2018-05-22 DIAGNOSIS — E878 Other disorders of electrolyte and fluid balance, not elsewhere classified: Secondary | ICD-10-CM | POA: Diagnosis present

## 2018-05-22 DIAGNOSIS — E876 Hypokalemia: Secondary | ICD-10-CM | POA: Diagnosis present

## 2018-05-22 DIAGNOSIS — Z515 Encounter for palliative care: Secondary | ICD-10-CM | POA: Diagnosis present

## 2018-05-22 DIAGNOSIS — R509 Fever, unspecified: Secondary | ICD-10-CM | POA: Diagnosis not present

## 2018-05-22 DIAGNOSIS — A419 Sepsis, unspecified organism: Secondary | ICD-10-CM

## 2018-05-22 DIAGNOSIS — T886XXA Anaphylactic reaction due to adverse effect of correct drug or medicament properly administered, initial encounter: Secondary | ICD-10-CM | POA: Diagnosis not present

## 2018-05-22 DIAGNOSIS — R945 Abnormal results of liver function studies: Secondary | ICD-10-CM | POA: Diagnosis present

## 2018-05-22 DIAGNOSIS — A4189 Other specified sepsis: Principal | ICD-10-CM | POA: Diagnosis present

## 2018-05-22 DIAGNOSIS — J189 Pneumonia, unspecified organism: Secondary | ICD-10-CM | POA: Diagnosis present

## 2018-05-22 DIAGNOSIS — G4730 Sleep apnea, unspecified: Secondary | ICD-10-CM | POA: Diagnosis present

## 2018-05-22 DIAGNOSIS — L602 Onychogryphosis: Secondary | ICD-10-CM | POA: Diagnosis not present

## 2018-05-22 DIAGNOSIS — Z8249 Family history of ischemic heart disease and other diseases of the circulatory system: Secondary | ICD-10-CM

## 2018-05-22 DIAGNOSIS — R000 Tachycardia, unspecified: Secondary | ICD-10-CM | POA: Diagnosis not present

## 2018-05-22 DIAGNOSIS — Z87891 Personal history of nicotine dependence: Secondary | ICD-10-CM

## 2018-05-22 DIAGNOSIS — R778 Other specified abnormalities of plasma proteins: Secondary | ICD-10-CM | POA: Diagnosis present

## 2018-05-22 DIAGNOSIS — Z9981 Dependence on supplemental oxygen: Secondary | ICD-10-CM

## 2018-05-22 DIAGNOSIS — J9621 Acute and chronic respiratory failure with hypoxia: Secondary | ICD-10-CM | POA: Diagnosis not present

## 2018-05-22 DIAGNOSIS — J449 Chronic obstructive pulmonary disease, unspecified: Secondary | ICD-10-CM | POA: Diagnosis present

## 2018-05-22 DIAGNOSIS — R7881 Bacteremia: Secondary | ICD-10-CM | POA: Diagnosis not present

## 2018-05-22 DIAGNOSIS — J90 Pleural effusion, not elsewhere classified: Secondary | ICD-10-CM | POA: Diagnosis not present

## 2018-05-22 DIAGNOSIS — B957 Other staphylococcus as the cause of diseases classified elsewhere: Secondary | ICD-10-CM | POA: Diagnosis not present

## 2018-05-22 DIAGNOSIS — R5383 Other fatigue: Secondary | ICD-10-CM | POA: Diagnosis present

## 2018-05-22 DIAGNOSIS — D509 Iron deficiency anemia, unspecified: Secondary | ICD-10-CM | POA: Diagnosis present

## 2018-05-22 DIAGNOSIS — R0902 Hypoxemia: Secondary | ICD-10-CM | POA: Diagnosis not present

## 2018-05-22 DIAGNOSIS — R278 Other lack of coordination: Secondary | ICD-10-CM | POA: Diagnosis not present

## 2018-05-22 DIAGNOSIS — L680 Hirsutism: Secondary | ICD-10-CM | POA: Diagnosis present

## 2018-05-22 DIAGNOSIS — R0689 Other abnormalities of breathing: Secondary | ICD-10-CM | POA: Diagnosis not present

## 2018-05-22 DIAGNOSIS — I6782 Cerebral ischemia: Secondary | ICD-10-CM | POA: Diagnosis not present

## 2018-05-22 DIAGNOSIS — M7989 Other specified soft tissue disorders: Secondary | ICD-10-CM | POA: Diagnosis not present

## 2018-05-22 DIAGNOSIS — R1310 Dysphagia, unspecified: Secondary | ICD-10-CM | POA: Diagnosis present

## 2018-05-22 DIAGNOSIS — Z0189 Encounter for other specified special examinations: Secondary | ICD-10-CM

## 2018-05-22 DIAGNOSIS — I080 Rheumatic disorders of both mitral and aortic valves: Secondary | ICD-10-CM | POA: Diagnosis present

## 2018-05-22 LAB — CBC WITH DIFFERENTIAL/PLATELET
Abs Immature Granulocytes: 0.11 10*3/uL — ABNORMAL HIGH (ref 0.00–0.07)
Basophils Absolute: 0 10*3/uL (ref 0.0–0.1)
Basophils Relative: 0 %
Eosinophils Absolute: 0 10*3/uL (ref 0.0–0.5)
Eosinophils Relative: 0 %
HCT: 34.5 % — ABNORMAL LOW (ref 36.0–46.0)
Hemoglobin: 9.9 g/dL — ABNORMAL LOW (ref 12.0–15.0)
Immature Granulocytes: 1 %
Lymphocytes Relative: 2 %
Lymphs Abs: 0.3 10*3/uL — ABNORMAL LOW (ref 0.7–4.0)
MCH: 24.8 pg — ABNORMAL LOW (ref 26.0–34.0)
MCHC: 28.7 g/dL — ABNORMAL LOW (ref 30.0–36.0)
MCV: 86.3 fL (ref 80.0–100.0)
Monocytes Absolute: 0.1 10*3/uL (ref 0.1–1.0)
Monocytes Relative: 1 %
Neutro Abs: 13.2 10*3/uL — ABNORMAL HIGH (ref 1.7–7.7)
Neutrophils Relative %: 96 %
Platelets: 266 10*3/uL (ref 150–400)
RBC: 4 MIL/uL (ref 3.87–5.11)
RDW: 18 % — ABNORMAL HIGH (ref 11.5–15.5)
WBC: 13.7 10*3/uL — ABNORMAL HIGH (ref 4.0–10.5)
nRBC: 0.1 % (ref 0.0–0.2)

## 2018-05-22 LAB — COMPREHENSIVE METABOLIC PANEL
ALT: 78 U/L — ABNORMAL HIGH (ref 0–44)
AST: 83 U/L — ABNORMAL HIGH (ref 15–41)
Albumin: 2.7 g/dL — ABNORMAL LOW (ref 3.5–5.0)
Alkaline Phosphatase: 110 U/L (ref 38–126)
Anion gap: 13 (ref 5–15)
BUN: 27 mg/dL — ABNORMAL HIGH (ref 8–23)
CO2: 34 mmol/L — ABNORMAL HIGH (ref 22–32)
Calcium: 8.4 mg/dL — ABNORMAL LOW (ref 8.9–10.3)
Chloride: 95 mmol/L — ABNORMAL LOW (ref 98–111)
Creatinine, Ser: 1.25 mg/dL — ABNORMAL HIGH (ref 0.44–1.00)
GFR calc Af Amer: 52 mL/min — ABNORMAL LOW (ref >60)
GFR calc non Af Amer: 44 mL/min — ABNORMAL LOW (ref >60)
Glucose, Bld: 108 mg/dL — ABNORMAL HIGH (ref 70–99)
Potassium: 3.2 mmol/L — ABNORMAL LOW (ref 3.5–5.1)
Sodium: 142 mmol/L (ref 135–145)
Total Bilirubin: 2.4 mg/dL — ABNORMAL HIGH (ref 0.3–1.2)
Total Protein: 6.8 g/dL (ref 6.5–8.1)

## 2018-05-22 LAB — CREATININE, SERUM
Creatinine, Ser: 1.28 mg/dL — ABNORMAL HIGH (ref 0.44–1.00)
GFR calc Af Amer: 50 mL/min — ABNORMAL LOW (ref >60)
GFR calc non Af Amer: 43 mL/min — ABNORMAL LOW (ref >60)

## 2018-05-22 LAB — C-REACTIVE PROTEIN: CRP: 24.3 mg/dL — ABNORMAL HIGH (ref <1.0)

## 2018-05-22 LAB — BRAIN NATRIURETIC PEPTIDE: B Natriuretic Peptide: 2117.1 pg/mL — ABNORMAL HIGH (ref 0.0–100.0)

## 2018-05-22 LAB — CBC
HCT: 30 % — ABNORMAL LOW (ref 36.0–46.0)
Hemoglobin: 8.8 g/dL — ABNORMAL LOW (ref 12.0–15.0)
MCH: 24.9 pg — ABNORMAL LOW (ref 26.0–34.0)
MCHC: 29.3 g/dL — ABNORMAL LOW (ref 30.0–36.0)
MCV: 85 fL (ref 80.0–100.0)
Platelets: 208 10*3/uL (ref 150–400)
RBC: 3.53 MIL/uL — ABNORMAL LOW (ref 3.87–5.11)
RDW: 18 % — ABNORMAL HIGH (ref 11.5–15.5)
WBC: 8.5 10*3/uL (ref 4.0–10.5)
nRBC: 0 % (ref 0.0–0.2)

## 2018-05-22 LAB — MAGNESIUM: Magnesium: 1.9 mg/dL (ref 1.7–2.4)

## 2018-05-22 LAB — LACTIC ACID, PLASMA
Lactic Acid, Venous: 2.5 mmol/L (ref 0.5–1.9)
Lactic Acid, Venous: 1.7 mmol/L (ref 0.5–1.9)

## 2018-05-22 LAB — I-STAT TROPONIN, ED: Troponin i, poc: 0.12 ng/mL (ref 0.00–0.08)

## 2018-05-22 LAB — PROCALCITONIN: Procalcitonin: 0.32 ng/mL

## 2018-05-22 LAB — FERRITIN: Ferritin: 1308 ng/mL — ABNORMAL HIGH (ref 11–307)

## 2018-05-22 MED ORDER — PRO-STAT SUGAR FREE PO LIQD
30.0000 mL | Freq: Two times a day (BID) | ORAL | Status: DC
  Administered 2018-05-22 – 2018-05-23 (×2): 30 mL via ORAL
  Filled 2018-05-22 (×3): qty 30

## 2018-05-22 MED ORDER — ONDANSETRON HCL 4 MG/2ML IJ SOLN: 4.0000 mg | Freq: Four times a day (QID) | INTRAMUSCULAR | Status: DC | PRN

## 2018-05-22 MED ORDER — SODIUM CHLORIDE 0.9 % IV SOLN
2.0000 g | Freq: Once | INTRAVENOUS | Status: AC
  Administered 2018-05-22: 2 g via INTRAVENOUS
  Filled 2018-05-22: qty 2

## 2018-05-22 MED ORDER — CLONAZEPAM 0.5 MG PO TABS: 0.5000 mg | ORAL_TABLET | Freq: Two times a day (BID) | ORAL | Status: DC | PRN

## 2018-05-22 MED ORDER — PANTOPRAZOLE SODIUM 40 MG PO TBEC
40.0000 mg | DELAYED_RELEASE_TABLET | Freq: Every day | ORAL | Status: DC
  Administered 2018-05-23: 40 mg via ORAL
  Filled 2018-05-22: qty 1

## 2018-05-22 MED ORDER — BUPROPION HCL ER (XL) 150 MG PO TB24
150.0000 mg | ORAL_TABLET | Freq: Every day | ORAL | Status: DC
  Administered 2018-05-23: 150 mg via ORAL
  Filled 2018-05-22 (×2): qty 1

## 2018-05-22 MED ORDER — METRONIDAZOLE IN NACL 5-0.79 MG/ML-% IV SOLN
500.0000 mg | Freq: Once | INTRAVENOUS | Status: AC
  Administered 2018-05-22: 500 mg via INTRAVENOUS
  Filled 2018-05-22: qty 100

## 2018-05-22 MED ORDER — MIRABEGRON ER 25 MG PO TB24
25.0000 mg | ORAL_TABLET | Freq: Every day | ORAL | Status: DC
  Administered 2018-05-23: 25 mg via ORAL
  Filled 2018-05-22 (×2): qty 1

## 2018-05-22 MED ORDER — SENNOSIDES-DOCUSATE SODIUM 8.6-50 MG PO TABS
1.0000 | ORAL_TABLET | Freq: Two times a day (BID) | ORAL | Status: DC
  Administered 2018-05-22 – 2018-05-23 (×2): 1 via ORAL
  Filled 2018-05-22 (×3): qty 1

## 2018-05-22 MED ORDER — VENLAFAXINE HCL 75 MG PO TABS
75.0000 mg | ORAL_TABLET | Freq: Every morning | ORAL | Status: DC
  Administered 2018-05-23 – 2018-05-27 (×5): 75 mg via ORAL
  Filled 2018-05-22 (×6): qty 1

## 2018-05-22 MED ORDER — SODIUM CHLORIDE 0.9 % IV SOLN
2.0000 g | Freq: Two times a day (BID) | INTRAVENOUS | Status: DC
  Administered 2018-05-23 – 2018-05-25 (×5): 2 g via INTRAVENOUS
  Filled 2018-05-22 (×9): qty 2

## 2018-05-22 MED ORDER — HEPARIN SODIUM (PORCINE) 5000 UNIT/ML IJ SOLN
5000.0000 [IU] | Freq: Three times a day (TID) | INTRAMUSCULAR | Status: DC
  Administered 2018-05-22 – 2018-05-24 (×5): 5000 [IU] via SUBCUTANEOUS
  Filled 2018-05-22 (×5): qty 1

## 2018-05-22 MED ORDER — VANCOMYCIN HCL 10 G IV SOLR
1250.0000 mg | INTRAVENOUS | Status: DC
  Administered 2018-05-23 – 2018-05-24 (×2): 1250 mg via INTRAVENOUS
  Filled 2018-05-22 (×3): qty 1250

## 2018-05-22 MED ORDER — VENLAFAXINE HCL 37.5 MG PO TABS: 37.5000 mg | ORAL_TABLET | ORAL | Status: DC

## 2018-05-22 MED ORDER — GUAIFENESIN-DM 100-10 MG/5ML PO SYRP
10.0000 mL | ORAL_SOLUTION | ORAL | Status: DC | PRN
  Filled 2018-05-22: qty 10

## 2018-05-22 MED ORDER — CLOPIDOGREL BISULFATE 75 MG PO TABS
75.0000 mg | ORAL_TABLET | Freq: Every day | ORAL | Status: DC
  Administered 2018-05-23: 75 mg via ORAL
  Filled 2018-05-22 (×2): qty 1

## 2018-05-22 MED ORDER — SODIUM CHLORIDE 0.9% FLUSH
3.0000 mL | Freq: Two times a day (BID) | INTRAVENOUS | Status: DC
  Administered 2018-05-22 – 2018-05-31 (×12): 3 mL via INTRAVENOUS

## 2018-05-22 MED ORDER — POTASSIUM CHLORIDE CRYS ER 20 MEQ PO TBCR
20.0000 meq | EXTENDED_RELEASE_TABLET | Freq: Two times a day (BID) | ORAL | Status: DC
  Administered 2018-05-22 – 2018-05-23 (×2): 20 meq via ORAL
  Filled 2018-05-22 (×3): qty 1

## 2018-05-22 MED ORDER — SODIUM CHLORIDE 0.9% FLUSH
3.0000 mL | Freq: Two times a day (BID) | INTRAVENOUS | Status: DC
  Administered 2018-05-22 – 2018-06-14 (×27): 3 mL via INTRAVENOUS

## 2018-05-22 MED ORDER — METOPROLOL TARTRATE 5 MG/5ML IV SOLN
2.5000 mg | INTRAVENOUS | Status: DC | PRN
  Administered 2018-05-25: 2.5 mg via INTRAVENOUS
  Filled 2018-05-22 (×2): qty 5

## 2018-05-22 MED ORDER — LEVALBUTEROL TARTRATE 45 MCG/ACT IN AERO
2.0000 | INHALATION_SPRAY | Freq: Four times a day (QID) | RESPIRATORY_TRACT | Status: DC
  Filled 2018-05-22: qty 15

## 2018-05-22 MED ORDER — METOPROLOL TARTRATE 5 MG/5ML IV SOLN
INTRAVENOUS | Status: AC
  Filled 2018-05-22: qty 5

## 2018-05-22 MED ORDER — RESOURCE THICKENUP CLEAR PO POWD
1.0000 | ORAL | Status: DC | PRN
  Filled 2018-05-22: qty 125

## 2018-05-22 MED ORDER — ACETAMINOPHEN 325 MG PO TABS: 650.0000 mg | ORAL_TABLET | Freq: Four times a day (QID) | ORAL | Status: DC | PRN

## 2018-05-22 MED ORDER — POLYETHYLENE GLYCOL 3350 17 G PO PACK
17.0000 g | PACK | Freq: Every day | ORAL | Status: DC | PRN
  Administered 2018-05-26 – 2018-05-28 (×3): 17 g via ORAL
  Filled 2018-05-22 (×3): qty 1

## 2018-05-22 MED ORDER — VANCOMYCIN HCL IN DEXTROSE 1-5 GM/200ML-% IV SOLN
1000.0000 mg | Freq: Once | INTRAVENOUS | Status: AC
  Administered 2018-05-22: 1000 mg via INTRAVENOUS
  Filled 2018-05-22: qty 200

## 2018-05-22 MED ORDER — TRAMADOL HCL 50 MG PO TABS: 50.0000 mg | ORAL_TABLET | Freq: Four times a day (QID) | ORAL | Status: DC | PRN

## 2018-05-22 MED ORDER — ADULT MULTIVITAMIN W/MINERALS CH
1.0000 | ORAL_TABLET | Freq: Every day | ORAL | Status: DC
  Administered 2018-05-23: 1 via ORAL
  Filled 2018-05-22 (×3): qty 1

## 2018-05-22 MED ORDER — FUROSEMIDE 10 MG/ML IJ SOLN
80.0000 mg | Freq: Two times a day (BID) | INTRAMUSCULAR | Status: DC
  Administered 2018-05-22 – 2018-05-23 (×2): 80 mg via INTRAVENOUS
  Filled 2018-05-22 (×2): qty 8

## 2018-05-22 MED ORDER — SODIUM CHLORIDE 0.9% FLUSH: 3.0000 mL | INTRAVENOUS | Status: DC | PRN

## 2018-05-22 MED ORDER — ADENOSINE 6 MG/2ML IV SOLN
INTRAVENOUS | Status: AC | PRN
  Administered 2018-05-22: 6 mg via INTRAVENOUS

## 2018-05-22 MED ORDER — ORAL CARE MOUTH RINSE
15.0000 mL | Freq: Two times a day (BID) | OROMUCOSAL | Status: DC
  Administered 2018-05-23: 15 mL via OROMUCOSAL

## 2018-05-22 MED ORDER — POTASSIUM CHLORIDE CRYS ER 20 MEQ PO TBCR
40.0000 meq | EXTENDED_RELEASE_TABLET | Freq: Once | ORAL | Status: AC
  Administered 2018-05-22: 40 meq via ORAL
  Filled 2018-05-22: qty 2

## 2018-05-22 MED ORDER — SODIUM CHLORIDE 0.9 % IV SOLN
250.0000 mL | INTRAVENOUS | Status: DC | PRN
  Administered 2018-05-23 – 2018-05-30 (×3): 250 mL via INTRAVENOUS

## 2018-05-22 MED ORDER — METOPROLOL TARTRATE 5 MG/5ML IV SOLN
2.5000 mg | Freq: Once | INTRAVENOUS | Status: AC
  Administered 2018-05-22: 2.5 mg via INTRAVENOUS

## 2018-05-22 MED ORDER — HYDROCOD POLST-CPM POLST ER 10-8 MG/5ML PO SUER: 5.0000 mL | Freq: Two times a day (BID) | ORAL | Status: DC | PRN

## 2018-05-22 MED ORDER — ONDANSETRON HCL 4 MG PO TABS: 4.0000 mg | ORAL_TABLET | Freq: Four times a day (QID) | ORAL | Status: DC | PRN

## 2018-05-22 MED ORDER — SILVER SULFADIAZINE 1 % EX CREA
1.0000 "application " | TOPICAL_CREAM | Freq: Every day | CUTANEOUS | Status: DC
  Administered 2018-05-24 – 2018-06-15 (×23): 1 via TOPICAL
  Filled 2018-05-22 (×2): qty 85

## 2018-05-22 MED ORDER — METOPROLOL TARTRATE 5 MG/5ML IV SOLN
2.5000 mg | Freq: Once | INTRAVENOUS | Status: AC
  Administered 2018-05-22: 2.5 mg via INTRAVENOUS
  Filled 2018-05-22: qty 5

## 2018-05-22 MED ORDER — IPRATROPIUM BROMIDE HFA 17 MCG/ACT IN AERS
2.0000 | INHALATION_SPRAY | RESPIRATORY_TRACT | Status: DC
  Filled 2018-05-22: qty 12.9

## 2018-05-22 MED ORDER — HYDROXYZINE HCL 10 MG PO TABS
10.0000 mg | ORAL_TABLET | Freq: Three times a day (TID) | ORAL | Status: DC | PRN
  Filled 2018-05-22: qty 1

## 2018-05-22 MED ORDER — VENLAFAXINE HCL 37.5 MG PO TABS
37.5000 mg | ORAL_TABLET | Freq: Every day | ORAL | Status: DC
  Administered 2018-05-22 – 2018-05-26 (×4): 37.5 mg via ORAL
  Filled 2018-05-22 (×7): qty 1

## 2018-05-22 MED ORDER — IPRATROPIUM BROMIDE HFA 17 MCG/ACT IN AERS
2.0000 | INHALATION_SPRAY | Freq: Three times a day (TID) | RESPIRATORY_TRACT | Status: DC
  Administered 2018-05-23: 2 via RESPIRATORY_TRACT
  Filled 2018-05-22: qty 12.9

## 2018-05-22 MED ORDER — LEVALBUTEROL TARTRATE 45 MCG/ACT IN AERO
2.0000 | INHALATION_SPRAY | Freq: Three times a day (TID) | RESPIRATORY_TRACT | Status: DC
  Administered 2018-05-23: 2 via RESPIRATORY_TRACT
  Filled 2018-05-22: qty 15

## 2018-05-22 NOTE — ED Notes (Signed)
On arrival in the ED pt was found to be in SVT with a rate of 160 and hypotensive with systolic BP in the 69'I. Pt is on NRB mask at 63ml.

## 2018-05-22 NOTE — ED Triage Notes (Signed)
Pt arrives to ED from [facility] with complaints of shortness of breath since yesterday. Pt was found to be 90% on room air.

## 2018-05-22 NOTE — Progress Notes (Signed)
Pharmacy Antibiotic Note  Shannon Carroll is a 68 y.o. female admitted on 06/16/2018 with pneumonia.  Pharmacy has been consulted for vancomycin and cefepime dosing. Pt is febrile with Tmax 101.6 and WBC is elevated at 13.6. SCr is slightly elevated but at patients baseline. Lactic acid is elevated at 2.5.   Plan: Vancomycin 2gm IV x 1 then 1250mg  IV Q24H Cefepime 2gm IV Q12H F/u renal fxn, C&S, clinical status and peak/trough at SS    Temp (24hrs), Avg:100.2 F (37.9 C), Min:98.7 F (37.1 C), Max:101.6 F (38.7 C)  Recent Labs  Lab 05/16/18 0400 05/17/18 0307 05/18/18 0408 05/19/18 0453 06/17/2018 1400  WBC 6.6 3.9* 6.9 8.7 13.7*  CREATININE 1.42* 1.18* 1.18* 1.22*  --     Estimated Creatinine Clearance: 64.4 mL/min (A) (by C-G formula based on SCr of 1.22 mg/dL (H)).    Allergies  Allergen Reactions  . Lisinopril Cough    Antimicrobials this admission: Vanc 4/3>> Cefepime 4/3>> Flagyl x 1 4/3   Dose adjustments this admission: N/A  Microbiology results: Pending  Thank you for allowing pharmacy to be a part of this patient's care.  Corrine Tillis, Rande Lawman 05/25/2018 2:44 PM

## 2018-05-22 NOTE — ED Provider Notes (Signed)
[facility] EMERGENCY DEPARTMENT Provider Note   CSN: [id number] Arrival date & time: 06/02/2018  1327    History   Chief Complaint Chief Complaint  Patient presents with  . Shortness of Breath    HPI Shannon Carroll is a 68 y.o. female.     Shannon Carroll is a 68 y.o. female with history of Hypertension, hyperlipidemia, diabetes, CKD, CHF, stroke and obesity, who presents to the emergency department via EMS for evaluation of shortness of breath.  Patient had a recent hospital admission from 3/18-3/31 for shortness of breath related to CHF exacerbation, patient had prolonged diuresis in the hospital, hospital course was complicated by elevated LFTs as well as intermittently elevated kidney function, after improvement she was ultimately discharged to [facility] on chronic 2 L nasal cannula.  She returns today via EMS with worsening shortness of breath, she reports that shortness of breath started worsening yesterday, she denies associated chest pain or palpitations.  Has had chills, unsure of any fevers.  Facility reported patient was at 90% on room air, patient arrived via EMS on nonrebreather mask at 15 L, EMS reports that as patient was rolling into the department her heart rate increased to the 160s and she appeared to be in SVT and was found to be hypotensive with BPs in the 70s and 80s.  Patient with labored breathing and tachypnea.  She denies abdominal pain, nausea or vomiting.  Has had intermittent lower extremity swelling and has compression dressings in place and reports this has significantly improved her swelling.     Past Medical History:  Diagnosis Date  . Adenomatous polyps 12/14   f/u 5 yrs dr Penelope Coop  . Arthritis   . Chronic kidney disease    stage 2  . Depression   . Diabetes mellitus without complication ([place])   . Hirsutism   . Hyperlipidemia   . Hypertension   . Murmur   . Obesity   . Situational stress   . Stroke [facility])     Patient Active  Problem List   Diagnosis Date Noted  . Abnormal LFTs   . Acute on chronic congestive heart failure ([place])   . SOB (shortness of breath)   . Hyperbilirubinemia   . Acute on chronic diastolic CHF (congestive heart failure) ([place]) 05/06/2018  . CKD (chronic kidney disease), stage III ([place]) 05/06/2018  . Normocytic anemia 05/06/2018  . Hypokalemia 05/06/2018  . Acute on chronic diastolic (congestive) heart failure ([place]) 05/06/2018  . Hyperlipidemia   . Depression   . Hypertension   . Stroke ([place])   . Shortness of breath 02/04/2018  . Mitral valve stenosis 01/30/2018  . Aortic valve stenosis 01/30/2018  . Pre-operative laboratory examination 01/30/2018  . Sleep apnea 01/30/2018  . Snoring 01/30/2018    Past Surgical History:  Procedure Laterality Date  . CESAREAN SECTION  12/23/1980  . COLONOSCOPY  06/2005-09/2007  . DILATION AND CURETTAGE OF UTERUS    . RIGHT/LEFT HEART CATH AND CORONARY ANGIOGRAPHY N/A 02/04/2018   Procedure: RIGHT/LEFT HEART CATH AND CORONARY ANGIOGRAPHY;  Surgeon: Nelva Bush, MD;  Location: [facility] CV LAB;  Service: Cardiovascular;  Laterality: N/A;  . TONSILLECTOMY AND ADENOIDECTOMY  1956     OB History   No obstetric history on file.      Home Medications    Prior to Admission medications   Medication Sig Start Date End Date Taking? Authorizing Provider  Amino Acids-Protein Hydrolys (FEEDING SUPPLEMENT, PRO-STAT SUGAR FREE 64,) LIQD Take  30 mLs by mouth 2 (two) times daily. 05/19/18   Sheikh, Omair Latif, DO  buPROPion (WELLBUTRIN XL) 150 MG 24 hr tablet Take 150 mg by mouth daily.    [provider]  clonazePAM (KLONOPIN) 0.5 MG tablet Take 1 tablet (0.5 mg total) by mouth 2 (two) times daily as needed for anxiety. 05/19/18   Raiford Noble Latif, DO  clopidogrel (PLAVIX) 75 MG tablet Take 1 tablet (75 mg total) by mouth daily. 03/24/18   Garvin Fila, MD  dextromethorphan-guaiFENesin [facility] DM) 30-600 MG 12hr tablet Take 1 tablet by  mouth 2 (two) times daily as needed for cough. 05/19/18   Raiford Noble Latif, DO  furosemide (LASIX) 80 MG tablet Take 1 tablet (80 mg total) by mouth 2 (two) times daily. 05/19/18   Raiford Noble Latif, DO  hydrOXYzine (ATARAX/VISTARIL) 10 MG tablet Take 1 tablet (10 mg total) by mouth 3 (three) times daily as needed for nausea. 05/19/18   Raiford Noble Latif, DO  ipratropium (ATROVENT) 0.02 % nebulizer solution Take 2.5 mLs (0.5 mg total) by nebulization every 6 (six) hours as needed for wheezing or shortness of breath. 05/19/18   Raiford Noble Latif, DO  iron polysaccharides (NIFEREX) 150 MG capsule Take 150 mg by mouth daily.    [provider]  levalbuterol Penne Lash) 0.63 MG/3ML nebulizer solution Take 3 mLs (0.63 mg total) by nebulization every 6 (six) hours as needed for wheezing or shortness of breath. 05/19/18   Sheikh, Omair Latif, DO  Maltodextrin-Xanthan Gum (RESOURCE THICKENUP CLEAR) POWD Take 120 g by mouth as needed. 05/19/18   Raiford Noble Latif, DO  metFORMIN (GLUCOPHAGE) 1000 MG tablet Take 1,000 mg by mouth 2 (two) times daily with a meal.    [provider]  metoprolol succinate (TOPROL-XL) 25 MG 24 hr tablet Take 0.5 tablets (12.5 mg total) by mouth daily. 05/20/18   Sheikh, Omair Latif, DO  mirabegron ER (MYRBETRIQ) 25 MG TB24 tablet Take 25 mg by mouth daily.    [provider]  Multiple Vitamins-Minerals (CENTRUM SILVER PO) Take by mouth.    [provider]  nystatin (NYSTATIN) powder Apply 1 g topically 2 (two) times daily as needed (rash).    [provider]  omeprazole (PRILOSEC) 40 MG capsule TAKE (1) CAPSULE BY MOUTH AT BEDTIME. Patient taking differently: Take 40 mg by mouth daily.  04/15/18   Lendon Colonel, NP  potassium chloride SA (K-DUR,KLOR-CON) 20 MEQ tablet Take 1 tablet (20 mEq total) by mouth 2 (two) times daily. 05/19/18   Sheikh, Omair Latif, DO  predniSONE (STERAPRED UNI-PAK 21 TAB) 10 MG (21) TBPK tablet Take 6  tablets on day 1, 5 tablets on day 2, 4 tablets on day 3, 3 tablets on day 4, 2 tablets on day 5, 1 tablet on day 6, and then stop on day 7. 05/19/18   Sheikh, Omair Latif, DO  senna-docusate (SENOKOT-S) 8.6-50 MG tablet Take 1 tablet by mouth 2 (two) times daily. 05/19/18   Raiford Noble Latif, DO  venlafaxine (EFFEXOR) 37.5 MG tablet Take 37.5-75 mg by mouth See admin instructions. Take 2 tablets (112.5 mg) by mouth in the morning & take 1 tablet (37.5 mg) by mouth at night.    [provider]    Family History Family History  Problem Relation Age of Onset  . Heart failure Mother   . Hypertension Mother   . Diabetes Mother   . Heart attack Father   . Skin cancer Father   .  Hypertension Father   . CAD Father   . Hypertension Sister   . Diabetes Sister   . Hypertension Brother   . Diabetes Brother     Social History Social History   Tobacco Use  . Smoking status: Former Smoker    Last attempt to quit: 07/13/1993    Years since quitting: 24.8  . Smokeless tobacco: Never Used  Substance Use Topics  . Alcohol use: Yes    Comment: 2 glasses per year  . Drug use: No     Allergies   Lisinopril   Review of Systems Review of Systems  Constitutional: Positive for chills and fever.  HENT: Negative.   Eyes: Negative for visual disturbance.  Respiratory: Positive for cough and shortness of breath.   Cardiovascular: Positive for leg swelling. Negative for chest pain.  Gastrointestinal: Negative for abdominal pain, nausea and vomiting.  Genitourinary: Negative for dysuria and frequency.  Musculoskeletal: Negative for myalgias.  Skin: Negative for color change and rash.  Neurological: Negative for syncope, weakness, light-headedness, numbness and headaches.     Physical Exam Updated Vital Signs BP 86/67   Pulse 161   Temp 101.6 F (38.7 C) (Axillary)   Resp (!) 30   SpO2 100% on 15L/min  Physical Exam Vitals signs and nursing note reviewed.  Constitutional:       General: She is in acute distress.     Appearance: She is well-developed. She is ill-appearing. She is not diaphoretic.     Comments: Patient critically ill, presenting on nonrebreather with 15 L oxygen, and SVT  HENT:     Head: Normocephalic and atraumatic.  Eyes:     General:        Right eye: No discharge.        Left eye: No discharge.     Pupils: Pupils are equal, round, and reactive to light.  Neck:     Musculoskeletal: Neck supple.  Cardiovascular:     Rate and Rhythm: Regular rhythm. Tachycardia present.     Heart sounds: Normal heart sounds.     Comments: SVT with HR in 160s Pulmonary:     Effort: Tachypnea present. No respiratory distress.     Breath sounds: Rhonchi and rales present.     Comments: Patient tachypneic to 30, some increased respiratory effort but not in acute respiratory distress.  Currently on nonrebreather mask with 15 L O2, lungs with crackles and rhonchi throughout with decreased air movement. Abdominal:     General: Bowel sounds are normal. There is no distension.     Palpations: Abdomen is soft. There is no mass.     Tenderness: There is no abdominal tenderness. There is no guarding.     Comments: Abdomen soft, nondistended, nontender to palpation in all quadrants without guarding or peritoneal signs  Musculoskeletal:        General: No deformity.     Comments: Bilateral compression dressings in place over lower extremities  Skin:    General: Skin is warm and dry.     Capillary Refill: Capillary refill takes less than 2 seconds.  Neurological:     Mental Status: She is alert and oriented to person, place, and time.     Coordination: Coordination normal.     Comments: Speech is clear, able to follow commands Moves extremities without ataxia, coordination intact   Psychiatric:        Mood and Affect: Mood normal.        Behavior: Behavior normal.  ED Treatments / Results  Labs (all labs ordered are listed, but only abnormal results  are displayed) Labs Reviewed  LACTIC ACID, PLASMA - Abnormal; Notable for the following components:      Result Value   Lactic Acid, Venous 2.5 (*)    All other components within normal limits  COMPREHENSIVE METABOLIC PANEL - Abnormal; Notable for the following components:   Potassium 3.2 (*)    Chloride 95 (*)    CO2 34 (*)    Glucose, Bld 108 (*)    BUN 27 (*)    Creatinine, Ser 1.25 (*)    Calcium 8.4 (*)    Albumin 2.7 (*)    AST 83 (*)    ALT 78 (*)    Total Bilirubin 2.4 (*)    GFR calc non Af Amer 44 (*)    GFR calc Af Amer 52 (*)    All other components within normal limits  CBC WITH DIFFERENTIAL/PLATELET - Abnormal; Notable for the following components:   WBC 13.7 (*)    Hemoglobin 9.9 (*)    HCT 34.5 (*)    MCH 24.8 (*)    MCHC 28.7 (*)    RDW 18.0 (*)    Neutro Abs 13.2 (*)    Lymphs Abs 0.3 (*)    Abs Immature Granulocytes 0.11 (*)    All other components within normal limits  URINALYSIS, COMPLETE (UACMP) WITH MICROSCOPIC - Abnormal; Notable for the following components:   Bacteria, UA RARE (*)    All other components within normal limits  BRAIN NATRIURETIC PEPTIDE - Abnormal; Notable for the following components:   B Natriuretic Peptide 2,117.1 (*)    All other components within normal limits  I-STAT TROPONIN, ED - Abnormal; Notable for the following components:   Troponin i, poc 0.12 (*)    All other components within normal limits  CULTURE, BLOOD (ROUTINE X 2)  CULTURE, BLOOD (ROUTINE X 2)  LACTIC ACID, PLASMA  MAGNESIUM    EKG None  Radiology Dg Chest Port 1 View  Result Date: 06/13/2018 CLINICAL DATA:  Shortness of breath since yesterday. Tachycardia. Former smoker. EXAM: PORTABLE CHEST 1 VIEW COMPARISON:  05/19/2018 FINDINGS: Lungs are adequately inflated and demonstrate worsening of bilateral multifocal airspace opacification which is most prominent over the right upper lobe and left perihilar region. Possible small amount left pleural fluid  unchanged. Mild stable cardiomegaly. Remainder the exam is unchanged. IMPRESSION: Interval worsening bilateral multifocal airspace process most prominent over the right upper lobe and left perihilar region likely multifocal pneumonia. Small amount left pleural fluid unchanged. Mild stable cardiomegaly. Electronically Signed   By: Marin Olp M.D.   On: 06/17/2018 14:29    Procedures .Critical Care Performed by: Jacqlyn Larsen, PA-C Authorized by: Jacqlyn Larsen, PA-C   Critical care provider statement:    Critical care time (minutes):  45   Critical care was necessary to treat or prevent imminent or life-threatening deterioration of the following conditions:  Cardiac failure, respiratory failure and sepsis (Sepsis, acute on chronic hypoxic respiratory failure, SVT)   Critical care was time spent personally by me on the following activities:  Discussions with consultants, evaluation of patient's response to treatment, examination of patient, ordering and performing treatments and interventions, ordering and review of laboratory studies, ordering and review of radiographic studies, pulse oximetry, re-evaluation of patient's condition, obtaining history from patient or surrogate and review of old charts   (including critical care time)  Medications Ordered in ED Medications  metoprolol tartrate (LOPRESSOR) 5 MG/5ML injection (has no administration in time range)  vancomycin (VANCOCIN) IVPB 1000 mg/200 mL premix (1,000 mg Intravenous New Bag/Given 06/03/2018 1434)  vancomycin (VANCOCIN) IVPB 1000 mg/200 mL premix (has no administration in time range)  adenosine (ADENOCARD) 6 MG/2ML injection (6 mg Intravenous Given 06/03/2018 1355)  metoprolol tartrate (LOPRESSOR) injection 2.5 mg (2.5 mg Intravenous Given 06/12/2018 1359)  ceFEPIme (MAXIPIME) 2 g in sodium chloride 0.9 % 100 mL IVPB (2 g Intravenous New Bag/Given 06/06/2018 1424)  metoprolol tartrate (LOPRESSOR) injection 2.5 mg (2.5 mg Intravenous Given  05/24/2018 1442)     Initial Impression / Assessment and Plan / ED Course  I have reviewed the triage vital signs and the nursing notes.  Pertinent labs & imaging results that were available during my care of the patient were reviewed by me and considered in my medical decision making (see chart for details).  On arrival patient noted to be tachycardic to 161 with blood pressure of 86/67, EKG shows patient is in SVT, she is also noted to be febrile and tachypneic on 15 L nonrebreather.  Patient appears critically ill, recently hospitalized for significant heart failure exacerbation, discharged to SNF facility and now coming in with worsening shortness of breath, chronic oxygen requirement of 2 L nasal cannula.  SVT was immediately addressed, patient given 6 mg adenosine which converted patient back to normal sinus rhythm with heart rate in the 80s-90s, she was also given 2.5 mg IV Lopressor to help prevent patient from returning to SVT.  She had a few brief episodes of less than 30 seconds heart rates in the 160s once again, was given additional 2.5 mg of Lopressor and has remained in sinus rhythm.  Blood pressure improved to the 100s/70s with resolution of SVT.  Unable to find history of prior episodes of SVT documented in patient's chart.  Patient was also febrile on arrival, code sepsis activated, basic labs, lactic acid, blood cultures, chest x-ray and urinalysis collected as well as EKG.  Given that blood pressures have stabilized and patient has significant heart failure history, 1 L fluid bolus given but will hold off on any additional fluids at this time.  Patient started on broad-spectrum antibiotics for suspected hospital-acquired pneumonia from recent hospitalization.  Initially EMS paramedics had reported that patient had tested negative for COVID-19 during recent hospitalization but no record of this was found, staff took appropriate precautions for both droplet and contact precautions, and I  am concerned this patient could have COVID-19 as source for infection and worsening respiratory status.  We will also check a BNP and troponin given SVT and known heart failure history.  Labs show a leukocytosis of 13.7 with left shift, lymphopenia also noted.  Chemistry is largely at baseline, mild hypokalemia of 3.2, p.o. potassium given, normal magnesium, doubt electrolyte derangement was cause for patient's episode of SVT.  Creatinine at baseline, glucose of 108, CO2 of 34, patient is a chronic CO2 retainer and this is not worse than usual, mild hypocalcemia, LFTs improved from recent hospitalization with transaminitis, T bili of 2.4.  Troponin is elevated at 0.12 but patient is not having any active chest pain she does not have any ischemic changes on EKG and I suspect this is likely demand ischemia from episode of SVT.  Show lactic acid is mildly elevated at 2.5 which is in line with suspected pneumonia causing sepsis, but patient does not meet criteria for severe sepsis and blood pressures remaining stable.  Urinalysis without signs  of infection.  X-ray shows a multifocal pneumonia with infiltrates most prominent in the right upper lobe and left hilar region.  Patient will require admission for continued IV antibiotics and stabilization, she is now stable on 4 L nasal cannula.  Will discuss with hospitalist for admission.  Will defer to them for COVID-19 testing.  Case discussed with Dr. Evangeline Gula with [facility] hospitalist who request Noncon chest CT to better differentiate multifocal pneumonia given patient's recent history of aspiration but also concern for COVID-19.  She will see and admit the patient.  Today's Vitals   05/21/2018 1430 05/21/2018 1438 06/04/2018 1438 05/25/2018 1445  BP: 102/75   102/78  Pulse: 92   86  Resp: (!) 25   (!) 29  Temp:  98.7 F (37.1 C)    TempSrc:  Oral    SpO2: 94%   99%  PainSc:   0-No pain    Patient discussed with Dr. Johnney Killian, who saw patient as well and agrees  with plan.   Final Clinical Impressions(s) / ED Diagnoses   Final diagnoses:  Sepsis due to pneumonia [facility])  Acute on chronic respiratory failure, unspecified whether with hypoxia or hypercapnia (HCC)  SVT (supraventricular tachycardia) ([employer])  Suspected Covid-19 Virus Infection    ED Discharge Orders    None       Jacqlyn Larsen, [place] 05/23/18 1423    Charlesetta Shanks, MD 05/23/18 1539

## 2018-05-22 NOTE — ED Provider Notes (Addendum)
Medical screening examination/treatment/procedure(s) were conducted as a shared visit with non-physician practitioner(s) and myself.  I personally evaluated the patient during the encounter.  None Was just recently admitted and discharged from the hospital for pneumonia.  She is at [facility] and became increasingly short of breath with hypoxia.  Patient denying any chest pain.  Patient is alert and appropriate.  She has tachypnea but is not showing significant work of breathing on supplemental oxygen.  Heart rate is tachycardic.  Monitor shows 160.  She has crackles in lung fields bilaterally.  Legs are wrapped in compression dressings.  With myself at bedside, patient was administered adenosine 6mg  for cardioversion.  She tolerated this well and converted to a sinus rhythm in the 80s and 90s.  That point 2.5 mg of Lopressor administered.  Patient had brief recrudescence of SVT but spontaneously reverted.  She was given an additional dose of 2.5 mg Lopressor.  Patient is being treated for sepsis with source of pneumonia.  I agree with plan of management.   CRITICAL CARE Performed by: Charlesetta Shanks   Total critical care time: 20 minutes  Critical care time was exclusive of separately billable procedures and treating other patients.  Critical care was necessary to treat or prevent imminent or life-threatening deterioration.  Critical care was time spent personally by me on the following activities: development of treatment plan with patient and/or surrogate as well as nursing, discussions with consultants, evaluation of patient's response to treatment, examination of patient, obtaining history from patient or surrogate, ordering and performing treatments and interventions, ordering and review of laboratory studies, ordering and review of radiographic studies, pulse oximetry and re-evaluation of patient's condition. Charlesetta Shanks, MD 06/06/2018 1621    Charlesetta Shanks, MD 05/23/18  1539

## 2018-05-22 NOTE — H&P (Signed)
History and Physical    TWILIA YAKLIN Carroll:[id number] DOB: [birth year] DOA: 05/23/2018  PCP: Harlan Stains, MD  Patient coming from: [place] [facility]  I have personally briefly reviewed patient's old medical records in [place]  Chief Complaint: Shortness of breath since yesterday with O2 sats 90% on room air  HPI: Shannon Carroll is a 68 y.o. female with medical history significant of hypertension, hyperlipidemia, vascular dementia, diet-controlled diabetes, stroke, depression, anxiety, chronic kidney disease stage III, diastolic congestive heart failure recently discharged from our facility after treatment for same, obstructive sleep apnea not on CPAP, mitral valve stenosis, aortic valve stenosis who just discharged from our facility on March 31 after treatment for diastolic congestive heart failure, acute kidney injury, abnormal LFTs, recurrent aspiration and dysphasia with speech and language pathology recommending a regular diet with nectar thick liquids.  Usually patient was refusing those but says she has been compliant with nectar thick liquids at the skilled nursing facility.  She was discharged from our hospital on 2 L of nasal cannula supplemental oxygen after she desaturated to 87%. In addition to shortness of breath on arrival in the emergency department the patient was found to be in SVT with a rate of 160 and was hypotensive with a systolic blood pressure in the 70s.  She was given 1 dose of adenosine and 2 doses of Lopressor 2.5 mg IV with improvement in her heart rate.  Pulse is now 86.  She was also noted to be febrile and hypotensive she was given IV fluids and her blood pressure came up to 102/78.  Evaluation in the ED revealed a BNP that was elevated and I am mildly elevated troponin as well.  Her LFTs which had previously been elevated have improved.  Upon consultation with me I requested a differential on her lymphocytes which she was found to be lymphopenic and  therefore a noncontrast CT scan of the chest was ordered due to her shortness of breath, fever, and lymphopenia.  CT scan was consistent with bilateral groundglass opacities and interstitial infiltrates assistant with a viral etiology.  Patient requiring 4-1/2 L of oxygen.  She will be admitted into the hospital with a diagnosis of acute respiratory failure multifocal pneumonia to rule out COVID-19.  Review of Systems: As per HPI otherwise all other systems reviewed and  negative.  She does have some mild cognitive impairment but is able to answer review of systems questions  Past Medical History:  Diagnosis Date   Adenomatous polyps 12/14   f/u 5 yrs dr Penelope Coop   Arthritis    Chronic kidney disease    stage 2   Depression    Diabetes mellitus without complication ([place])    Hirsutism    Hyperlipidemia    Hypertension    Murmur    Obesity    Situational stress    Stroke [facility])     Past Surgical History:  Procedure Laterality Date   CESAREAN SECTION  12/23/1980   COLONOSCOPY  06/2005-09/2007   DILATION AND CURETTAGE OF UTERUS     RIGHT/LEFT HEART CATH AND CORONARY ANGIOGRAPHY N/A 02/04/2018   Procedure: RIGHT/LEFT HEART CATH AND CORONARY ANGIOGRAPHY;  Surgeon: Nelva Bush, MD;  Location: [facility];  Service: Cardiovascular;  Laterality: N/A;   TONSILLECTOMY AND ADENOIDECTOMY  1956    Social History   Social History Narrative   Pt moved 02/19/2018 to [place].     reports that she quit smoking about 24  years ago. She has never used smokeless tobacco. She reports current alcohol use. She reports that she does not use drugs.  Allergies  Allergen Reactions   Lisinopril Cough    Family History  Problem Relation Age of Onset   Heart failure Mother    Hypertension Mother    Diabetes Mother    Heart attack Father    Skin cancer Father    Hypertension Father    CAD Father    Hypertension Sister    Diabetes Sister      Hypertension Brother    Diabetes Brother      Prior to Admission medications   Medication Sig Start Date End Date Taking? Authorizing Provider  Amino Acids-Protein Hydrolys (FEEDING SUPPLEMENT, PRO-STAT SUGAR FREE 64,) LIQD Take 30 mLs by mouth 2 (two) times daily. 05/19/18  Yes Sheikh, Omair Latif, DO  buPROPion (WELLBUTRIN XL) 150 MG 24 hr tablet Take 150 mg by mouth daily.   Yes [provider]  clonazePAM (KLONOPIN) 0.5 MG tablet Take 1 tablet (0.5 mg total) by mouth 2 (two) times daily as needed for anxiety. 05/19/18  Yes Sheikh, Omair Latif, DO  clopidogrel (PLAVIX) 75 MG tablet Take 1 tablet (75 mg total) by mouth daily. 03/24/18  Yes Garvin Fila, MD  dextromethorphan-guaiFENesin [facility] DM) 30-600 MG 12hr tablet Take 1 tablet by mouth 2 (two) times daily as needed for cough. 05/19/18  Yes Sheikh, Omair Latif, DO  furosemide (LASIX) 80 MG tablet Take 1 tablet (80 mg total) by mouth 2 (two) times daily. 05/19/18  Yes Sheikh, Omair Latif, DO  guaiFENesin-dextromethorphan (ROBITUSSIN DM) 100-10 MG/5ML syrup Take 20 mLs by mouth every 6 (six) hours as needed for cough. For 5 days , started on 05-20-18   Yes [provider]  hydrOXYzine (ATARAX/VISTARIL) 10 MG tablet Take 1 tablet (10 mg total) by mouth 3 (three) times daily as needed for nausea. 05/19/18  Yes Sheikh, Omair Latif, DO  ipratropium (ATROVENT) 0.02 % nebulizer solution Take 2.5 mLs (0.5 mg total) by nebulization every 6 (six) hours as needed for wheezing or shortness of breath. 05/19/18  Yes Sheikh, Omair Latif, DO  iron polysaccharides (NIFEREX) 150 MG capsule Take 150 mg by mouth daily.   Yes [provider]  levalbuterol (XOPENEX) 0.63 MG/3ML nebulizer solution Take 3 mLs (0.63 mg total) by nebulization every 6 (six) hours as needed for wheezing or shortness of breath. 05/19/18  Yes Sheikh, Omair Latif, DO  metFORMIN (GLUCOPHAGE) 1000 MG tablet Take 1,000 mg by mouth 2 (two) times daily with a meal.    Yes [provider]  metoprolol succinate (TOPROL-XL) 25 MG 24 hr tablet Take 0.5 tablets (12.5 mg total) by mouth daily. 05/20/18  Yes Sheikh, Omair Latif, DO  mirabegron ER (MYRBETRIQ) 25 MG TB24 tablet Take 25 mg by mouth daily.   Yes [provider]  Multiple Vitamins-Minerals (CENTRUM SILVER PO) Take by mouth.   Yes [provider]  nystatin (NYSTATIN) powder Apply 1 g topically 2 (two) times daily as needed (rash).   Yes [provider]  omeprazole (PRILOSEC) 40 MG capsule TAKE (1) CAPSULE BY MOUTH AT BEDTIME. Patient taking differently: Take 20 mg by mouth daily.  04/15/18  Yes Lendon Colonel, NP  potassium chloride SA (K-DUR,KLOR-CON) 20 MEQ tablet Take 1 tablet (20 mEq total) by mouth 2 (two) times daily. 05/19/18  Yes Sheikh, Omair Latif, DO  predniSONE (STERAPRED UNI-PAK 21 TAB) 10 MG (21) TBPK tablet Take 6 tablets on day  1, 5 tablets on day 2, 4 tablets on day 3, 3 tablets on day 4, 2 tablets on day 5, 1 tablet on day 6, and then stop on day 7. 05/19/18  Yes Sheikh, Omair Latif, DO  senna-docusate (SENOKOT-S) 8.6-50 MG tablet Take 1 tablet by mouth 2 (two) times daily. 05/19/18  Yes Sheikh, Omair Latif, DO  silver sulfADIAZINE (SILVADENE) 1 % cream Apply 1 application topically daily. Left lower leg   Yes [provider]  venlafaxine (EFFEXOR) 37.5 MG tablet Take 37.5-75 mg by mouth See admin instructions. Taking 2 tablets (75mg ) in the Am and 1 tablet (37.5mg ) at night   Yes [provider]  Maltodextrin-Xanthan Gum (RESOURCE THICKENUP CLEAR) POWD Take 120 g by mouth as needed. 05/19/18   Kerney Elbe, DO    Physical Exam:  Constitutional: NAD, calm, comfortable Vitals:   05/27/2018 1445 05/20/2018 1500 06/12/2018 1515 05/29/2018 1600  BP: 102/78 101/72 100/71 101/63  Pulse: 86 84 83 83  Resp: (!) 29 (!) 26 (!) 27 (!) 24  Temp:      TempSrc:      SpO2: 99% 100% 99% 100%   Eyes: PERRL, lids and conjunctivae normal ENMT:  Mucous membranes are moist. Posterior pharynx clear of any exudate or lesions.Normal dentition.  Neck: normal, supple, no masses, no thyromegaly Respiratory: Coarse bilaterally, no wheezing, no crackles. Normal respiratory effort.  Mild accessory muscle use.  Cardiovascular: Tachycardic rate and rhythm now resolved, no murmurs / rubs / gallops. No extremity edema. 2+ pedal pulses. No carotid bruits.  Abdomen: no tenderness, no masses palpated. No hepatosplenomegaly. Bowel sounds positive.  Musculoskeletal: no clubbing / cyanosis. No joint deformity upper and lower extremities. Good ROM, no contractures. Normal muscle tone.  Skin: no rashes, lesions, ulcers. No induration Neurologic: CN 2-12 grossly intact. Sensation intact, DTR normal. Strength 5/5 in all 4.  Psychiatric: Normal judgment and insight. Alert and oriented x 3. Normal mood.    Labs on Admission: I have personally reviewed following labs and imaging studies  CBC: Recent Labs  Lab 05/16/18 0400 05/17/18 0307 05/18/18 0408 05/19/18 0453 05/25/2018 1400  WBC 6.6 3.9* 6.9 8.7 13.7*  NEUTROABS 5.5 3.4 6.2 7.9* 13.2*  HGB 9.2* 9.0* 9.3* 9.0* 9.9*  HCT 32.7* 30.4* 31.7* 30.3* 34.5*  MCV 88.4 87.9 88.5 87.8 86.3  PLT 185 165 184 162 099   Basic Metabolic Panel: Recent Labs  Lab 05/16/18 0400 05/17/18 0307 05/18/18 0408 05/19/18 0453 05/29/2018 1400  NA 144 141 143 139 142  K 3.6 4.0 4.1 3.8 3.2*  CL 98 100 98 93* 95*  CO2 33* 32 33* 38* 34*  GLUCOSE 106* 161* 157* 155* 108*  BUN 25* 24* 30* 32* 27*  CREATININE 1.42* 1.18* 1.18* 1.22* 1.25*  CALCIUM 8.6* 8.3* 8.7* 8.2* 8.4*  MG 2.2 2.1 2.2 2.0 1.9  PHOS 2.4* 3.5 4.1 3.4  --    GFR: Estimated Creatinine Clearance: 62.8 mL/min (A) (by C-G formula based on SCr of 1.25 mg/dL (H)). Liver Function Tests: Recent Labs  Lab 05/16/18 0400 05/17/18 0307 05/18/18 0408 05/19/18 0453 06/03/2018 1400  AST 450* 292* 192* 114* 83*  ALT 362* 275* 214* 157* 78*  ALKPHOS 167* 158*  139* 123 110  BILITOT 1.2 1.4* 1.2 1.1 2.4*  PROT 6.5 6.6 6.8 6.4* 6.8  ALBUMIN 2.8* 2.7* 2.8* 2.7* 2.7*   Urine analysis:    Component Value Date/Time   COLORURINE AMBER (A) 05/11/2018 1002   APPEARANCEUR CLEAR 05/11/2018 1002  APPEARANCEUR Cloudy (A) 03/04/2018 1123   LABSPEC 1.021 05/11/2018 1002   PHURINE 5.0 05/11/2018 1002   GLUCOSEU NEGATIVE 05/11/2018 1002   HGBUR NEGATIVE 05/11/2018 1002   BILIRUBINUR NEGATIVE 05/11/2018 1002   BILIRUBINUR Negative 03/04/2018 1123   KETONESUR NEGATIVE 05/11/2018 1002   PROTEINUR 100 (A) 05/11/2018 1002   NITRITE POSITIVE (A) 05/11/2018 1002   LEUKOCYTESUR NEGATIVE 05/11/2018 1002    Radiological Exams on Admission: Ct Chest Wo Contrast  Result Date: 05/21/2018 CLINICAL DATA:  68 year old female with shortness of breath, fever. Patient also has a history of congestive heart failure. EXAM: CT CHEST WITHOUT CONTRAST TECHNIQUE: Multidetector CT imaging of the chest was performed following the standard protocol without IV contrast. COMPARISON:  Chest x-ray obtained earlier today; prior chest x-ray 05/19/18 FINDINGS: Cardiovascular: Limited evaluation in the absence of intravenous contrast. Cardiomegaly. Calcification of the mitral valve annulus. Coronary artery calcifications also present. Atherosclerotic calcifications throughout the aorta. No pericardial effusion. Mediastinum/Nodes: Thyroid gland is unremarkable. Borderline enlarged mediastinal lymph nodes. Index right paratracheal lymph node measures 1.2 cm in short axis. Index low right paratracheal lymph node measures 1.2 cm in short axis. Unremarkable thoracic esophagus. Lungs/Pleura: Extensive patchy ground-glass and more consolidative airspace opacities in a peripheral and peribronchovascular distribution throughout all lobes of both lungs but preferentially within the upper lobes. There is relative sparing of the middle lobe. Somewhat less significant disease within the dependent portions of the  right lower lobe makes aspiration less likely. Additionally, there is mild bilateral lower lobe atelectasis secondary to the presence of small bilateral pleural effusions. Upper Abdomen: Small volume perihepatic ascites. Relative right renal atrophy. Musculoskeletal: No acute fracture or aggressive appearing lytic or blastic osseous lesion. IMPRESSION: 1. There are a spectrum of findings in the lungs which can be seen with acute atypical infection (as well as other non-infectious etiologies). In particular, viral pneumonia (including COVID-19) should be considered in the appropriate clinical setting. Given the upper lung predominant pattern of distribution, aspiration is considered significantly less likely despite the patient's underlying risk factors. Similarly, congestive heart failure is considered less likely given the absence of interlobular septal thickening. 2. Small to moderate bilateral layering pleural effusions. 3. Small volume perihepatic site ease of uncertain etiology. 4. Borderline enlarged mediastinal lymph nodes are favored to be reactive. 5. Coronary artery calcifications. 6.  Aortic Atherosclerosis (ICD10-170.0). 7. Relative right renal atrophy. These results were called by telephone at the time of interpretation on 05/31/2018 at 4:42 pm to Dr. Evangeline Gula, who verbally acknowledged these results. Electronically Signed   By: Jacqulynn Cadet M.D.   On: 06/16/2018 16:43   Dg Chest Port 1 View  Result Date: 05/23/2018 CLINICAL DATA:  Shortness of breath since yesterday. Tachycardia. Former smoker. EXAM: PORTABLE CHEST 1 VIEW COMPARISON:  05/19/2018 FINDINGS: Lungs are adequately inflated and demonstrate worsening of bilateral multifocal airspace opacification which is most prominent over the right upper lobe and left perihilar region. Possible small amount left pleural fluid unchanged. Mild stable cardiomegaly. Remainder the exam is unchanged. IMPRESSION: Interval worsening bilateral multifocal  airspace process most prominent over the right upper lobe and left perihilar region likely multifocal pneumonia. Small amount left pleural fluid unchanged. Mild stable cardiomegaly. Electronically Signed   By: Marin Olp M.D.   On: 06/12/2018 14:29    EKG: Independently reviewed.  SVT at 160  Assessment/Plan Principal Problem:   Multifocal pneumonia Active Problems:   Shortness of breath   Acute on chronic diastolic CHF (congestive heart failure) (HCC)   Acute  respiratory failure (HCC)   Hypertension   CKD (chronic kidney disease), stage III (HCC)   Hypokalemia   Abnormal LFTs   Elevated troponin   SVT (supraventricular tachycardia) (HCC)   Sleep apnea   Stroke (HCC)   Dementia, vascular (HCC)   Acute respiratory failure with hypoxia ([place])    1.  Acute respiratory failure with multifocal pneumonia: In combination with fever and shortness of breath very concerning for influenza-like illness with possibility of COVID-19.  Will admit the patient into the hospital given that she appears to have some volume overload issues will not order excessive IV fluids but rather will continue her on her regular dose of Lasix and place her on a mild fluid restriction.  Continue antipyretics as necessary.  Hold blood pressure medications as blood pressures are soft.  2.  Acute on chronic diastolic congestive heart failure: Patient received IV instead of oral Lasix.  Fluid restriction of 1200 mL per 24 hours.  Monitor chest x-ray daily weights.  Will hold any ACE or arb due to COVID-19.  3.  Chronic kidney disease stage III: Monitor creatinine and avoid nephrotoxic agents.  4.  Hypertension: Blood pressure medications on hold due to soft blood pressures.  5.  Hypokalemia we will replete orally.  6.  Elevated troponin: Likely due to strain will not cycle troponins but will recheck a troponin in a.m.  We will continue supportive care  7.  SVT: Currently resolved will provide IV beta-blocker should  patient heart rate elevate.  8.  Sleep apnea: Patient not on any CPAP device.  9.  History of stroke with vascular dementia: Noted continue supportive care.  10.  Goals of care: Patient wishes to be a full code.  I did discuss with her husband he states that she is not really able to make a lot of decisions for herself but does support her full CODE STATUS.  Request that either he or his grandson be called.  See below  DVT prophylaxis: Subcu heparin Code Status: Full code per patient wish and my discussion with her Family Communication: spoke with pt husband.  He would like to be primary contact at [phone] however he lives at assisted living facility and if he does not answer the phone he may be in the dining room.  If he is not available please call the patient's son Ashlyne [place]len[place] area code [phone] 5 Disposition Plan: Likely back to skilled nursing facility in 5 to 7 days Consults called: None Admission status: Acute inpatient   Lady Deutscher MD FACP [facility] Pager [phone]  How to contact the [facility] Attending or Consulting provider [place] or covering provider during after hours [place], for this patient?  1. Check the care team in [facility] and look for a) attending/consulting [facility] provider listed and b) the [facility] team listed 2. Log into [email or web address] and use [place]'s universal password to access. If you do not have the password, please contact the hospital operator. 3. Locate the [facility] provider you are looking for under [facility] and page to a number that you can be directly reached. 4. If you still have difficulty reaching the provider, please page the [facility] (Director on Call) for the Hospitalists listed on amion for assistance.  If 7PM-7AM, please contact night-coverage [email or web address] Password [email or web address]  06/13/2018, 5:25 PM

## 2018-05-22 NOTE — ED Notes (Signed)
Shannon Carroll (husband) [phone] would like a call for any new updates with pt

## 2018-05-23 ENCOUNTER — Inpatient Hospital Stay (HOSPITAL_COMMUNITY): Payer: Medicare Other

## 2018-05-23 DIAGNOSIS — E876 Hypokalemia: Secondary | ICD-10-CM

## 2018-05-23 DIAGNOSIS — J189 Pneumonia, unspecified organism: Secondary | ICD-10-CM

## 2018-05-23 LAB — URINALYSIS, COMPLETE (UACMP) WITH MICROSCOPIC
Bilirubin Urine: NEGATIVE
Glucose, UA: NEGATIVE mg/dL
Hgb urine dipstick: NEGATIVE
Ketones, ur: NEGATIVE mg/dL
Leukocytes,Ua: NEGATIVE
Nitrite: NEGATIVE
Protein, ur: NEGATIVE mg/dL
Specific Gravity, Urine: 1.012 (ref 1.005–1.030)
pH: 6 (ref 5.0–8.0)

## 2018-05-23 LAB — POCT I-STAT 7, (LYTES, BLD GAS, ICA,H+H)
Acid-Base Excess: 17 mmol/L — ABNORMAL HIGH (ref 0.0–2.0)
Bicarbonate: 42.7 mmol/L — ABNORMAL HIGH (ref 20.0–28.0)
Calcium, Ion: 1.07 mmol/L — ABNORMAL LOW (ref 1.15–1.40)
HCT: 30 % — ABNORMAL LOW (ref 36.0–46.0)
Hemoglobin: 10.2 g/dL — ABNORMAL LOW (ref 12.0–15.0)
O2 Saturation: 100 %
Patient temperature: 101.6
Potassium: 3.2 mmol/L — ABNORMAL LOW (ref 3.5–5.1)
Sodium: 141 mmol/L (ref 135–145)
TCO2: 44 mmol/L — ABNORMAL HIGH (ref 22–32)
pCO2 arterial: 62.3 mmHg — ABNORMAL HIGH (ref 32.0–48.0)
pH, Arterial: 7.45 (ref 7.350–7.450)
pO2, Arterial: 476 mmHg — ABNORMAL HIGH (ref 83.0–108.0)

## 2018-05-23 LAB — CK: Total CK: 26 U/L — ABNORMAL LOW (ref 38–234)

## 2018-05-23 LAB — COMPREHENSIVE METABOLIC PANEL
ALT: 65 U/L — ABNORMAL HIGH (ref 0–44)
AST: 70 U/L — ABNORMAL HIGH (ref 15–41)
Albumin: 2.5 g/dL — ABNORMAL LOW (ref 3.5–5.0)
Alkaline Phosphatase: 94 U/L (ref 38–126)
Anion gap: 15 (ref 5–15)
BUN: 32 mg/dL — ABNORMAL HIGH (ref 8–23)
CO2: 32 mmol/L (ref 22–32)
Calcium: 8.2 mg/dL — ABNORMAL LOW (ref 8.9–10.3)
Chloride: 95 mmol/L — ABNORMAL LOW (ref 98–111)
Creatinine, Ser: 1.27 mg/dL — ABNORMAL HIGH (ref 0.44–1.00)
GFR calc Af Amer: 51 mL/min — ABNORMAL LOW (ref >60)
GFR calc non Af Amer: 44 mL/min — ABNORMAL LOW (ref >60)
Glucose, Bld: 97 mg/dL (ref 70–99)
Potassium: 3.7 mmol/L (ref 3.5–5.1)
Sodium: 142 mmol/L (ref 135–145)
Total Bilirubin: 1.9 mg/dL — ABNORMAL HIGH (ref 0.3–1.2)
Total Protein: 6.3 g/dL — ABNORMAL LOW (ref 6.5–8.1)

## 2018-05-23 LAB — CBC WITH DIFFERENTIAL/PLATELET
Abs Immature Granulocytes: 0.05 K/uL (ref 0.00–0.07)
Basophils Absolute: 0 K/uL (ref 0.0–0.1)
Basophils Relative: 0 %
Eosinophils Absolute: 0 K/uL (ref 0.0–0.5)
Eosinophils Relative: 0 %
HCT: 32.4 % — ABNORMAL LOW (ref 36.0–46.0)
Hemoglobin: 9.3 g/dL — ABNORMAL LOW (ref 12.0–15.0)
Immature Granulocytes: 1 %
Lymphocytes Relative: 6 %
Lymphs Abs: 0.5 K/uL — ABNORMAL LOW (ref 0.7–4.0)
MCH: 24.8 pg — ABNORMAL LOW (ref 26.0–34.0)
MCHC: 28.7 g/dL — ABNORMAL LOW (ref 30.0–36.0)
MCV: 86.4 fL (ref 80.0–100.0)
Monocytes Absolute: 0.1 K/uL (ref 0.1–1.0)
Monocytes Relative: 1 %
Neutro Abs: 7.7 K/uL (ref 1.7–7.7)
Neutrophils Relative %: 92 %
Platelets: 204 K/uL (ref 150–400)
RBC: 3.75 MIL/uL — ABNORMAL LOW (ref 3.87–5.11)
RDW: 18.2 % — ABNORMAL HIGH (ref 11.5–15.5)
WBC: 8.3 K/uL (ref 4.0–10.5)
nRBC: 0 % (ref 0.0–0.2)

## 2018-05-23 LAB — MAGNESIUM: Magnesium: 1.9 mg/dL (ref 1.7–2.4)

## 2018-05-23 LAB — C-REACTIVE PROTEIN: CRP: 26 mg/dL — ABNORMAL HIGH

## 2018-05-23 LAB — FERRITIN: Ferritin: 1483 ng/mL — ABNORMAL HIGH (ref 11–307)

## 2018-05-23 LAB — GLUCOSE, CAPILLARY: Glucose-Capillary: 90 mg/dL (ref 70–99)

## 2018-05-23 MED ORDER — PANTOPRAZOLE SODIUM 40 MG PO PACK
40.0000 mg | PACK | Freq: Every day | ORAL | Status: DC
  Administered 2018-05-24 – 2018-05-26 (×4): 40 mg
  Filled 2018-05-23 (×4): qty 20

## 2018-05-23 MED ORDER — ETOMIDATE 2 MG/ML IV SOLN
20.0000 mg | Freq: Once | INTRAVENOUS | Status: AC
  Administered 2018-05-23: 20 mg via INTRAVENOUS

## 2018-05-23 MED ORDER — VITAL HIGH PROTEIN PO LIQD
1000.0000 mL | ORAL | Status: DC
  Administered 2018-05-24 – 2018-05-25 (×2): 1000 mL
  Filled 2018-05-23: qty 1000

## 2018-05-23 MED ORDER — MIDAZOLAM HCL 2 MG/2ML IJ SOLN
1.0000 mg | INTRAMUSCULAR | Status: DC | PRN
  Administered 2018-05-24: 1 mg via INTRAVENOUS
  Filled 2018-05-23 (×2): qty 2

## 2018-05-23 MED ORDER — FUROSEMIDE 40 MG PO TABS
80.0000 mg | ORAL_TABLET | Freq: Two times a day (BID) | ORAL | Status: DC
  Administered 2018-05-23: 80 mg via ORAL
  Filled 2018-05-23: qty 2
  Filled 2018-05-23: qty 1

## 2018-05-23 MED ORDER — FENTANYL BOLUS VIA INFUSION
25.0000 ug | INTRAVENOUS | Status: DC | PRN
  Administered 2018-05-24 – 2018-05-25 (×2): 25 ug via INTRAVENOUS
  Filled 2018-05-23: qty 25

## 2018-05-23 MED ORDER — METOPROLOL SUCCINATE ER 25 MG PO TB24
12.5000 mg | ORAL_TABLET | Freq: Every day | ORAL | Status: DC
  Administered 2018-05-23: 12.5 mg via ORAL
  Filled 2018-05-23: qty 1

## 2018-05-23 MED ORDER — INSULIN ASPART 100 UNIT/ML ~~LOC~~ SOLN: 2.0000 [IU] | SUBCUTANEOUS | Status: DC

## 2018-05-23 MED ORDER — ZINC SULFATE 220 (50 ZN) MG PO CAPS
220.0000 mg | ORAL_CAPSULE | Freq: Every day | ORAL | Status: DC
  Administered 2018-05-24 – 2018-05-26 (×3): 220 mg
  Filled 2018-05-23 (×3): qty 1

## 2018-05-23 MED ORDER — DOCUSATE SODIUM 50 MG/5ML PO LIQD: 100.0000 mg | Freq: Two times a day (BID) | ORAL | Status: DC | PRN

## 2018-05-23 MED ORDER — PRO-STAT SUGAR FREE PO LIQD
30.0000 mL | Freq: Two times a day (BID) | ORAL | Status: DC
  Administered 2018-05-24 – 2018-05-25 (×4): 30 mL
  Filled 2018-05-23 (×3): qty 30

## 2018-05-23 MED ORDER — METOPROLOL SUCCINATE ER 25 MG PO TB24
25.0000 mg | ORAL_TABLET | Freq: Every day | ORAL | Status: DC
  Filled 2018-05-23: qty 1

## 2018-05-23 MED ORDER — POLYSACCHARIDE IRON COMPLEX 150 MG PO CAPS
150.0000 mg | ORAL_CAPSULE | Freq: Every day | ORAL | Status: DC
  Administered 2018-05-23: 150 mg via ORAL
  Filled 2018-05-23: qty 1

## 2018-05-23 MED ORDER — MIDAZOLAM HCL 2 MG/2ML IJ SOLN
INTRAMUSCULAR | Status: AC
  Administered 2018-05-23: 2 mg
  Filled 2018-05-23: qty 2

## 2018-05-23 MED ORDER — MIDAZOLAM HCL 2 MG/2ML IJ SOLN
1.0000 mg | INTRAMUSCULAR | Status: DC | PRN
  Administered 2018-05-24 – 2018-05-25 (×3): 1 mg via INTRAVENOUS
  Filled 2018-05-23 (×2): qty 2

## 2018-05-23 MED ORDER — FENTANYL CITRATE (PF) 100 MCG/2ML IJ SOLN
50.0000 ug | Freq: Once | INTRAMUSCULAR | Status: AC
  Administered 2018-05-23: 22:00:00 50 ug via INTRAVENOUS

## 2018-05-23 MED ORDER — FENTANYL CITRATE (PF) 100 MCG/2ML IJ SOLN
INTRAMUSCULAR | Status: AC
  Administered 2018-05-23: 100 ug
  Filled 2018-05-23: qty 2

## 2018-05-23 MED ORDER — HYDROXYCHLOROQUINE SULFATE 200 MG PO TABS
200.0000 mg | ORAL_TABLET | Freq: Two times a day (BID) | ORAL | Status: AC
  Administered 2018-05-24 – 2018-05-28 (×8): 200 mg via ORAL
  Filled 2018-05-23 (×8): qty 1

## 2018-05-23 MED ORDER — METOPROLOL SUCCINATE 12.5 MG HALF TABLET: 12.5000 mg | ORAL_TABLET | Freq: Once | ORAL | Status: DC

## 2018-05-23 MED ORDER — SODIUM CHLORIDE 0.9 % IV SOLN: INTRAVENOUS | Status: DC | PRN

## 2018-05-23 MED ORDER — ROCURONIUM BROMIDE 50 MG/5ML IV SOLN
100.0000 mg | Freq: Once | INTRAVENOUS | Status: AC
  Administered 2018-05-23: 100 mg via INTRAVENOUS

## 2018-05-23 MED ORDER — SODIUM CHLORIDE 0.9 % IV SOLN
500.0000 mg | INTRAVENOUS | Status: DC
  Administered 2018-05-23 – 2018-05-25 (×3): 500 mg via INTRAVENOUS
  Filled 2018-05-23 (×4): qty 500

## 2018-05-23 MED ORDER — HYDROXYCHLOROQUINE SULFATE 200 MG PO TABS
400.0000 mg | ORAL_TABLET | Freq: Two times a day (BID) | ORAL | Status: AC
  Administered 2018-05-24 (×2): 400 mg via ORAL
  Filled 2018-05-23 (×2): qty 2

## 2018-05-23 MED ORDER — POTASSIUM CHLORIDE 20 MEQ/15ML (10%) PO SOLN
40.0000 meq | Freq: Every day | ORAL | Status: DC
  Administered 2018-05-24 – 2018-05-26 (×4): 40 meq via ORAL
  Filled 2018-05-23 (×4): qty 30

## 2018-05-23 MED ORDER — FENTANYL 2500MCG IN NS 250ML (10MCG/ML) PREMIX INFUSION
0.0000 ug/h | INTRAVENOUS | Status: DC
  Administered 2018-05-23: 21:00:00 100 ug/h via INTRAVENOUS
  Administered 2018-05-24: 16:00:00 50 ug/h via INTRAVENOUS
  Administered 2018-05-25: 18:00:00 100 ug/h via INTRAVENOUS
  Filled 2018-05-23 (×3): qty 250

## 2018-05-23 MED ORDER — ACETAMINOPHEN 325 MG PO TABS
650.0000 mg | ORAL_TABLET | Freq: Four times a day (QID) | ORAL | Status: DC | PRN
  Administered 2018-06-02 – 2018-06-11 (×13): 650 mg via ORAL
  Filled 2018-05-23 (×14): qty 2

## 2018-05-23 MED ORDER — NOREPINEPHRINE 4 MG/250ML-% IV SOLN
INTRAVENOUS | Status: AC
  Administered 2018-05-23: 5 mg
  Filled 2018-05-23: qty 250

## 2018-05-23 MED ORDER — NOREPINEPHRINE 4 MG/250ML-% IV SOLN
0.0000 ug/min | INTRAVENOUS | Status: DC
  Administered 2018-05-23: 5 ug/min via INTRAVENOUS
  Administered 2018-05-23: 23:00:00 5 mg via INTRAVENOUS
  Administered 2018-05-24: 5 ug/min via INTRAVENOUS
  Administered 2018-05-25: 2 ug/min via INTRAVENOUS
  Administered 2018-05-25: 5 ug/min via INTRAVENOUS
  Administered 2018-05-25: 4 ug/min via INTRAVENOUS
  Administered 2018-05-26: 5 ug/min via INTRAVENOUS
  Filled 2018-05-23 (×3): qty 250

## 2018-05-23 NOTE — Progress Notes (Addendum)
PROGRESS NOTE   Shannon Carroll  BJS:[id number]    DOB: [birth year]    DOA: 05/23/2018  PCP: Harlan Stains, MD   I have briefly reviewed patients previous medical records in [facility].  Brief Narrative:  68 year old female patient with PMH of HTN, HLD, diet-controlled DM 2, stroke, depression, stage III CKD, chronic diastolic CHF, aortic valve stenosis, suspected OSA, recent hospitalization 3/18-3/31 for acute on chronic diastolic CHF, course complicated by acute hypoxic respiratory failure, acute kidney injury, abnormal LFTs, SVT, aspiration pneumonia, dysphagia for which SLP recommended regular diet and nectar thickened liquids but patient refused, was discharged to [facility] and rehab/SNF, presented back to [facility] ED on 4/3 due to dyspnea and hypoxia with oxygen saturation of 90% on room air, noted to be in SVT at 160 bpm in the ED, hypotensive with SBP in the 70s, received a dose of adenosis and then 2 doses of IV Lopressor 2.5 mg with improvement in heart rate, also noted to be febrile, lymphopenic, CT chest suggested acute atypical infection, in particular viral pneumonia and admitted with acute hypoxic respiratory failure due to multifocal pneumonia, ruling out Covid-19 in the context of ongoing pandemic.   Assessment & Plan:   Principal Problem:   Multifocal pneumonia Active Problems:   Sleep apnea   Shortness of breath   Acute on chronic diastolic CHF (congestive heart failure) (HCC)   Hypertension   Stroke (HCC)   CKD (chronic kidney disease), stage III (HCC)   Hypokalemia   Abnormal LFTs   Acute respiratory failure (HCC)   Elevated troponin   SVT (supraventricular tachycardia) (HCC)   Acute respiratory failure with hypoxia (HCC)   Dementia, vascular ([place])   1. Multifocal/atypical pneumonia, suspected viral pneumonia, R/O Covid-19: Detailed CT chest report as below, aspiration pneumonia and CHF felt to be less likely.  Empirically started on IV cefepime and vancomycin,  continue.  Follow Covid testing results. 2. Sepsis: Met sepsis criteria on admission including fever, tachycardia, tachypnea, leukocytosis and d/t to suspected Acute viral vs less likely bacterial PNA. Was not given aggressive IVF per sepsis protocol d/t CHF history. Rest as above. 3. Acute respiratory failure with hypoxia: Most likely related to problem pneumonia and less likely due to decompensated CHF.  Oxygen supplementation and wean for saturations >92%.  Monitor closely. 4. Acute on chronic diastolic CHF: Mild.  Was on Lasix IV 80 mg twice daily.  Intake output charting may be inaccurate, put out 850 mL urine yesterday and -78 mL since admission.  Clinically appears improved and compensated.  Transitioned to prior home dose of Lasix. 5. Stage III chronic kidney disease: Creatinine stable in the 1.2 range. 6. Essential hypertension: Ongoing soft blood pressures but asymptomatic.  Holding antihypertensives. 7. Hypokalemia: Replaced.  Magnesium normal. 8. Elevated troponin: Likely due to demand ischemia.  No chest pain reported. 9. PSVT: Possibly precipitated by acute illness.  PRN IV metoprolol.  As per RN report late this morning she developed tachycardia in the 160s, transient worsening hypoxia, suspect SVT but reverted spontaneously back to sinus rhythm in the 90s.  Continue telemetry monitoring.  Resume Toprol-XL 12.5 mg daily when blood pressure permits. 10. Suspected OSA/OHS and pulmonary hypertension: Needs outpatient follow-up with sleep study.  Continue diuretics.  No CPAP while hospitalized due to concern for Covid rule out. 11. History of stroke/vascular dementia: Continue clopidogrel.  Holding atorvastatin. 12. Normocytic anemia/suspected anemia of chronic disease and iron deficiency: Stable.  Continue iron supplements. 13. Recent abnormal LFTs: Work-up during  recent hospitalization was unremarkable.  LFTs continue to improve. 14. Anxiety & depression: Stable.  Continue bupropion,  venlafaxine and PRN clonazepam. 15. Hyperlipidemia: Atorvastatin 40 mg daily on hold due to recent abnormal LFTs.  Consider starting as outpatient. 16. Morbid obesity/Body mass index is 40.66 kg/m. 17. Mitral and aortic valve stenosis: Outpatient follow-up. 18. Diet-controlled DM: A1c recently 5.2.  Hold metformin. 19. Dysphagia: Recent SLP recommendations were for regular diet and nectar thickened liquids but patient declines and continues with regular diet and thin liquids. 20. LE wraps: Both lower extremities have Ace wrap up to the knees, last changed 4/1.  Patient states that these are changed every week.  Unclear etiology.?  Chronic venous stasis versus lymphedema.  Continue changing as per previous.  Isolation/PPE: Airborne(patient developed transient worsening hypoxia during episode of tachycardia and was placed on HFNC) and contact.  Patient: None.  Provider: Head cover, CAPR, gown, gloves and shoe cover. DVT prophylaxis: Heparin subcutaneous Code Status: Full Family Communication: Unable to reach spouse, phone not functioning.  I discussed in detail with patient's son, updated care and answered questions. Disposition: To be determined pending clinical improvement.   Consultants:  None  Procedures:  None  Antimicrobials:  IV cefepime and vancomycin   Subjective: Patient interviewed and examined along with RN in room.  States that she feels better.  Dyspnea improved but breathing not yet at baseline.  Mild dry cough.  No chest pain, palpitations, dizziness or lightheadedness.  ROS: As above, otherwise negative.  Objective:  Vitals:   05/23/18 0354 05/23/18 0603 05/23/18 0817 05/23/18 1126  BP: 96/75 97/81 (!) 86/64   Pulse:  94 88   Resp: (!) 22 (!) 22    Temp: 97.7 F (36.5 C)  98.7 F (37.1 C)   TempSrc: Oral     SpO2:   96% 98%  Weight:  121.3 kg    Height:  _0  (1.727 m)      Examination:  General exam: Pleasant middle-aged female, moderately built and  obese, lying comfortably propped up in bed without distress. Respiratory system: Slightly diminished breath sounds in the bases with occasional basal crackles but otherwise clear to auscultation. Respiratory effort normal. Cardiovascular system: S1 & S2 heard, RRR. No JVD, murmurs, rubs, gallops or clicks.  Bilateral lower extremities have Ace wrap from foot up to the knee last changed on 05/20/2018.  Telemetry personally reviewed: Sinus rhythm.  3 beat NSVT. Gastrointestinal system: Abdomen is nondistended, soft and nontender. No organomegaly or masses felt. Normal bowel sounds heard. Central nervous system: Alert and oriented to person, place and partly to time. No focal neurological deficits. Extremities: Symmetric 5 x 5 power. Skin: No rashes, lesions or ulcers Psychiatry: Judgement and insight appear somewhat impaired. Mood & affect flat.     Data Reviewed: I have personally reviewed following labs and imaging studies  CBC: Recent Labs  Lab 05/17/18 0307 05/18/18 0408 05/19/18 0453 05/20/2018 1400 05/26/2018 1937 05/23/18 0431  WBC 3.9* 6.9 8.7 13.7* 8.5 8.3  NEUTROABS 3.4 6.2 7.9* 13.2*  --  7.7  HGB 9.0* 9.3* 9.0* 9.9* 8.8* 9.3*  HCT 30.4* 31.7* 30.3* 34.5* 30.0* 32.4*  MCV 87.9 88.5 87.8 86.3 85.0 86.4  PLT 165 184 162 266 208 948   Basic Metabolic Panel: Recent Labs  Lab 05/17/18 0307 05/18/18 0408 05/19/18 0453 05/20/2018 1400 06/12/2018 1937 05/23/18 0431  NA 141 143 139 142  --  142  K 4.0 4.1 3.8 3.2*  --  3.7  CL  100 98 93* 95*  --  95*  CO2 32 33* 38* 34*  --  32  GLUCOSE 161* 157* 155* 108*  --  97  BUN 24* 30* 32* 27*  --  32*  CREATININE 1.18* 1.18* 1.22* 1.25* 1.28* 1.27*  CALCIUM 8.3* 8.7* 8.2* 8.4*  --  8.2*  MG 2.1 2.2 2.0 1.9  --  1.9  PHOS 3.5 4.1 3.4  --   --   --    Liver Function Tests: Recent Labs  Lab 05/17/18 0307 05/18/18 0408 05/19/18 0453 06/18/2018 1400 05/23/18 0431  AST 292* 192* 114* 83* 70*  ALT 275* 214* 157* 78* 65*  ALKPHOS 158*  139* 123 110 94  BILITOT 1.4* 1.2 1.1 2.4* 1.9*  PROT 6.6 6.8 6.4* 6.8 6.3*  ALBUMIN 2.7* 2.8* 2.7* 2.7* 2.5*   Cardiac Enzymes: Recent Labs  Lab 05/23/18 0431  CKTOTAL 26*    Radiology Studies: Ct Chest Wo Contrast  Result Date: 06/07/2018 CLINICAL DATA:  68 year old female with shortness of breath, fever. Patient also has a history of congestive heart failure. EXAM: CT CHEST WITHOUT CONTRAST TECHNIQUE: Multidetector CT imaging of the chest was performed following the standard protocol without IV contrast. COMPARISON:  Chest x-ray obtained earlier today; prior chest x-ray 05/19/18 FINDINGS: Cardiovascular: Limited evaluation in the absence of intravenous contrast. Cardiomegaly. Calcification of the mitral valve annulus. Coronary artery calcifications also present. Atherosclerotic calcifications throughout the aorta. No pericardial effusion. Mediastinum/Nodes: Thyroid gland is unremarkable. Borderline enlarged mediastinal lymph nodes. Index right paratracheal lymph node measures 1.2 cm in short axis. Index low right paratracheal lymph node measures 1.2 cm in short axis. Unremarkable thoracic esophagus. Lungs/Pleura: Extensive patchy ground-glass and more consolidative airspace opacities in a peripheral and peribronchovascular distribution throughout all lobes of both lungs but preferentially within the upper lobes. There is relative sparing of the middle lobe. Somewhat less significant disease within the dependent portions of the right lower lobe makes aspiration less likely. Additionally, there is mild bilateral lower lobe atelectasis secondary to the presence of small bilateral pleural effusions. Upper Abdomen: Small volume perihepatic ascites. Relative right renal atrophy. Musculoskeletal: No acute fracture or aggressive appearing lytic or blastic osseous lesion. IMPRESSION: 1. There are a spectrum of findings in the lungs which can be seen with acute atypical infection (as well as other  non-infectious etiologies). In particular, viral pneumonia (including COVID-19) should be considered in the appropriate clinical setting. Given the upper lung predominant pattern of distribution, aspiration is considered significantly less likely despite the patient's underlying risk factors. Similarly, congestive heart failure is considered less likely given the absence of interlobular septal thickening. 2. Small to moderate bilateral layering pleural effusions. 3. Small volume perihepatic site ease of uncertain etiology. 4. Borderline enlarged mediastinal lymph nodes are favored to be reactive. 5. Coronary artery calcifications. 6.  Aortic Atherosclerosis (ICD10-170.0). 7. Relative right renal atrophy. These results were called by telephone at the time of interpretation on 06/13/2018 at 4:42 pm to Dr. Evangeline Gula, who verbally acknowledged these results. Electronically Signed   By: Jacqulynn Cadet M.D.   On: 06/13/2018 16:43   Dg Chest Port 1 View  Result Date: 06/10/2018 CLINICAL DATA:  Shortness of breath since yesterday. Tachycardia. Former smoker. EXAM: PORTABLE CHEST 1 VIEW COMPARISON:  05/19/2018 FINDINGS: Lungs are adequately inflated and demonstrate worsening of bilateral multifocal airspace opacification which is most prominent over the right upper lobe and left perihilar region. Possible small amount left pleural fluid unchanged. Mild stable cardiomegaly. Remainder the exam  is unchanged. IMPRESSION: Interval worsening bilateral multifocal airspace process most prominent over the right upper lobe and left perihilar region likely multifocal pneumonia. Small amount left pleural fluid unchanged. Mild stable cardiomegaly. Electronically Signed   By: Marin Olp M.D.   On: 06/12/2018 14:29        Scheduled Meds:  buPROPion  150 mg Oral Daily   clopidogrel  75 mg Oral Daily   feeding supplement (PRO-STAT SUGAR FREE 64)  30 mL Oral BID   furosemide  80 mg Oral BID   heparin  5,000 Units  Subcutaneous Q8H   ipratropium  2 puff Inhalation TID   iron polysaccharides  150 mg Oral Daily   levalbuterol  2 puff Inhalation TID   mouth rinse  15 mL Mouth Rinse BID   mirabegron ER  25 mg Oral Daily   multivitamin with minerals  1 tablet Oral Daily   pantoprazole  40 mg Oral Daily   potassium chloride SA  20 mEq Oral BID   senna-docusate  1 tablet Oral BID   silver sulfADIAZINE  1 application Topical Daily   sodium chloride flush  3 mL Intravenous Q12H   sodium chloride flush  3 mL Intravenous Q12H   venlafaxine  37.5 mg Oral QHS   venlafaxine  75 mg Oral q morning - 10a   Continuous Infusions:  sodium chloride Stopped (05/23/18 0400)   ceFEPime (MAXIPIME) IV 2 g (05/23/18 1339)   vancomycin       LOS: 1 day     Vernell Leep, MD, FACP, [facility]  To contact the attending provider between 7A-7P or the covering provider during after hours 7P-7A, please log into the web site [email or web address] and access using universal [place] password for that web site. If you do not have the password, please call the hospital operator.  05/23/2018, 1:51 PM

## 2018-05-23 NOTE — Progress Notes (Signed)
Central Telemetry called and said the pts HR was sustaining in the 160s, pulled PRN metropolol and prepared to give but upon arrival of medication patients HR was back in the 90s so the dose was held. MD notified. 12-Lead EKG taken and put in chart.

## 2018-05-23 NOTE — Progress Notes (Addendum)
12.5 mg metoprolol PO given at 15:01, pt's current HR 160 non sustained, on non-rebreather @ 10, pulse oximetry shows 99% oxygen, pt denies SOB, distress and pain, will continue to monitor  Palma Holter, RN

## 2018-05-23 NOTE — Consult Note (Addendum)
..   NAME:  Shannon Carroll, MRN:  [id number], DOB:  October 01, 1950, LOS: 1 ADMISSION DATE:  06/17/2018, CONSULTATION DATE:  05/23/2018 REFERRING MD:  Janeann Merl MD, CHIEF COMPLAINT:  Shortness of Breath   Brief History   68 yr old female with PMHx HFpEF, COPD, w/ bilateral infiltrates COVID positive on supplemental oxygen 8L with increased work of breathing, fatigue, and  Diaphoretic. PCCM consulted for acute respiratory distress.  History of present illness   68 yr old female with PMHx HFpEF, COPD, MV stenosis, AV stensosis, OSA, HLD, HTN, CKD stage III, intermittent confusion and delirium ( per last admission) and DM presented on 05/06/2018 for shortness of breath which was linked to CHF exacerbation ( BNP 1137) and treated with diuresis. Wound care was consulted for left lateral lower extremity with green drainage and maceration. Her renal function declined post diuresis and pt had abnormal LFTs, and further complicated by dysphagia/aspiration. On this admission she had leukopenia. Pt was discharged to SNF on 05/19/2018.  There is a DNR documented on paper chart signed by son [name] and dated 05/19/2018.  On 06/09/2018 pt was seen in [place] from [place] for SOB Sat 90% on room air she was started on NRB mask at 15L. Also noted in chart she was in SVT at 160 and hypotensive.  Pt received adenosine for cardioversion and went into sinus rhythm in the 80s-90s and was started on Lopressor. She was evaluated by [facility] and patient was noted to be leukopenic and non-contrast CT chest showed bilateral GGO with interstitial infiltrates -> r/o COVID  PCCM consulted this evening when patients work of breathing increased and saturations dropped despite HFNC.  On evaluation increased work of breathing,  Using accessory muscles and diaphoretic. COVID 19 test positive.  Spoke with patient who was alert and oriented at the time of my evaluation she is awake but very tired speaking with several pauses on supplemental oxygen using  accessory muscles.  She states that she feels tired and weak and that her breathing is worse.  I informed her of her covid test results and explained about her clinical condition and prognosis. Patient stated "they say I'm a DNR but I don't want to be." We discussed in detail what aggressive interventions mean. She still wanted me to try everything.   Clairified resuscitation status with family ( son [name] and husband).   Past Medical History  .Marland Kitchen Active Ambulatory Problems    Diagnosis Date Noted  . Mitral valve stenosis 01/30/2018  . Aortic valve stenosis 01/30/2018  . Pre-operative laboratory examination 01/30/2018  . Sleep apnea 01/30/2018  . Snoring 01/30/2018  . Shortness of breath 02/04/2018  . Acute on chronic diastolic CHF (congestive heart failure) ([place]) 05/06/2018  . Hyperlipidemia   . Depression   . Hypertension   . Stroke ([place])   . CKD (chronic kidney disease), stage III ([place]) 05/06/2018  . Normocytic anemia 05/06/2018  . Hypokalemia 05/06/2018  . Acute on chronic diastolic (congestive) heart failure ([place]) 05/06/2018  . Abnormal LFTs   . Acute on chronic congestive heart failure ([place])   . SOB (shortness of breath)   . Hyperbilirubinemia    Resolved Ambulatory Problems    Diagnosis Date Noted  . No Resolved Ambulatory Problems   Past Medical History:  Diagnosis Date  . Adenomatous polyps 12/14  . Arthritis   . Chronic kidney disease   . Diabetes mellitus without complication ([place])   . Hirsutism   . Murmur   . Obesity   .  Situational stress     Significant Hospital Events   Respiratory distress: 05/23/2018  Consults:  PCCM 05/23/2018  Procedures:  Endotracheal intubation- 05/23/2018  Significant Diagnostic Tests:   Sodium 142 K+ 3.7 Cl 95 Bicarb 32 BUN 32 Cr 1.27 AG 15 Mg 1.9 AST 70 ALT 65 Albumin 2.5 GFR 44  WBC 8.3 Hgb 9.3 Hct 32 Plts 204  Micro Data:  Blood cx 06/13/2018- negative to date Urine culture from previous admission + E coli pan sensitive on  05/11/2018 Received 5 days of IV Ceftriaxone- complete Rx Antimicrobials:  Vancomycin- 05/23/2018 Cefepime- 05/23/2018 Hydroxychloroquine- upon transfer to ICU -COVID Axithromycin - upon transfer to ICU- COVID   Objective   Blood pressure 104/77, pulse (!) 153, temperature 98.7 F (37.1 C), resp. rate (!) 22, height _0  (1.727 m), weight 121.3 kg, SpO2 98 %.    Vent Mode: PRVC FiO2 (%):  [100 %] 100 % Set Rate:  [16 bmp] 16 bmp Vt Set:  [500 mL] 500 mL PEEP:  [12 cmH20] 12 cmH20 Plateau Pressure:  [12 cmH20] 12 cmH20   Intake/Output Summary (Last 24 hours) at 05/23/2018 2104 Last data filed at 05/23/2018 1500 Gross per 24 hour  Intake 270.95 ml  Output 850 ml  Net -579.05 ml   Filed Weights   05/23/18 0603  Weight: 121.3 kg    Examination: General: obese female in moderate distress on NRB  HENT: normocephalic atraumatic dry oral mucosa Lungs: diminished on right with diffuse crackles in bilateral fields  Cardiovascular: S1 and S2 increased rate no appreciable gallop rub or murmur Abdomen: soft obese abdomen not distended + BS Extremities: lower extremities dressed up to the knee no appreciable edema Neuro: alert awake no focal deficits oriented to place and person- coherent conversation GU: no indwelling foley catheter    Assessment & Plan:  1. Acute Hypoxic Respiratory Failure- ARDS -COVID Positive Plan: Early intubation in Negative pressure environment with full PPE RRT and RT helped to safely transport patient to ICU for endotracheal intubation ARDS protocol: PRVC TV 6cc/kg RR 16 PEEP 12 FiO2 100  Sedation protocol for intubated patient RASS goal 0 to -1 On continuous fentanyl with prn versed Received RSI for intubation ABG post intubation - 7.45/62/476/42- titrate down FiO2 first  Repeat ABG in 1hr Place arterial line for ease of blood draws  2. COVID positive CRP 26 on last check Ferritin 1308 WBC 8.3 Lymphocytes 6 Plan: - start on Hydroxychloroquine Prior  to initiation - EKG and QTc check - start on Azithromycin - start on Zinc Continues in Negative pressure room  3. SVT with Transient Hypotension - most likely increased HR secondary to hypoxia Post intubation and sedation HR normalized and BP stable Last ECHO on last admission 04/2018 showed an EF 65-70% incoordinate septal motion, mild LVH, mildly reduced RV systolic function, moderate LAE, mild RA, heavy MAC with moderate mitral stenosis and trivial MR, mild TR, RVSP 53 mmHg, dilated IVC.  Plan: Check Mg Phos and Potassium levels - replete as needed Continue on cardiac monitoring Maintain MAP >29mHg If pt requires elevated vasopressors will place a CVC  4. Transaminases AST elevated last value 70 On previous admission pt had a negative Hepatitis panel  Elevation thought to be MASH/NAFLD Continue to trend AST ALT  5. CKD Stage 3 with Anion Gap Metabolic Acidosis LA 1.7 Plan: Avoid nephrotoxic meds Place indwelling foley catheter  Monitor Is and Os Check Vancomycin level  6. Anemia of Inflammation Iron low TIBC low  In  iron deficiency TIBC is typically elevated No benefit to supplemental iron in this scenario If Hgb <8 will transfuse    Best practice:  Diet: NPO Pain/Anxiety/Delirium protocol (if indicated): continuous fentanyl prn versed post intubation VAP protocol (if indicated): yes DVT prophylaxis: Lovenox GI prophylaxis: Protonix Glucose control: ISS if BG exceeds 130m/dl Mobility: bedrest Code Status: Limited no compression no push doses of ACLS, ok with intubation ok with interventions for reversible injury Family Communication: I called her husband Mr SAxel Fillerwho did not answer on first attempt. I was able to speak with her son BAaron Edelmanand we discussed her clinical condition in detail and he stated that he would like to try interventions and rescinded the DNR.  I spoke to the family post intubation and explained clinical progression, morbidity and mortality given  her preexsiting conditions and her positive covid status.  They understand that her prognosis is very guarded. They would like [place] continue with mechanical ventilation and critical care management however in the event that the patient goes into cardiac arrest, no compressions and no ACLS meds.   Pt and family rescinded DNR documented on 05/19/2018. Disposition: Transfer to ICU  Labs   CBC: Recent Labs  Lab 05/17/18 0307 05/18/18 0408 05/19/18 0453 06/05/2018 1400 05/25/2018 1937 05/23/18 0431  WBC 3.9* 6.9 8.7 13.7* 8.5 8.3  NEUTROABS 3.4 6.2 7.9* 13.2*  --  7.7  HGB 9.0* 9.3* 9.0* 9.9* 8.8* 9.3*  HCT 30.4* 31.7* 30.3* 34.5* 30.0* 32.4*  MCV 87.9 88.5 87.8 86.3 85.0 86.4  PLT 165 184 162 266 208 2828   Basic Metabolic Panel: Recent Labs  Lab 05/17/18 0307 05/18/18 0408 05/19/18 0453 06/07/2018 1400 06/02/2018 1937 05/23/18 0431  NA 141 143 139 142  --  142  K 4.0 4.1 3.8 3.2*  --  3.7  CL 100 98 93* 95*  --  95*  CO2 32 33* 38* 34*  --  32  GLUCOSE 161* 157* 155* 108*  --  97  BUN 24* 30* 32* 27*  --  32*  CREATININE 1.18* 1.18* 1.22* 1.25* 1.28* 1.27*  CALCIUM 8.3* 8.7* 8.2* 8.4*  --  8.2*  MG 2.1 2.2 2.0 1.9  --  1.9  PHOS 3.5 4.1 3.4  --   --   --    GFR: Estimated Creatinine Clearance: 59 mL/min (A) (by C-G formula based on SCr of 1.27 mg/dL (H)). Recent Labs  Lab 05/19/18 0453 06/18/2018 1400 06/12/2018 1650 06/02/2018 1937 05/23/18 0431  PROCALCITON  --   --   --  0.32  --   WBC 8.7 13.7*  --  8.5 8.3  LATICACIDVEN  --  2.5* 1.7  --   --     Liver Function Tests: Recent Labs  Lab 05/17/18 0307 05/18/18 0408 05/19/18 0453 06/01/2018 1400 05/23/18 0431  AST 292* 192* 114* 83* 70*  ALT 275* 214* 157* 78* 65*  ALKPHOS 158* 139* 123 110 94  BILITOT 1.4* 1.2 1.1 2.4* 1.9*  PROT 6.6 6.8 6.4* 6.8 6.3*  ALBUMIN 2.7* 2.8* 2.7* 2.7* 2.5*   No results for input(s): LIPASE, AMYLASE in the last 168 hours. No results for input(s): AMMONIA in the last 168 hours.  ABG     Component Value Date/Time   PHART 7.452 (H) 05/16/2018 0958   PCO2ART 54.2 (H) 05/16/2018 0958   PO2ART 125 (H) 05/16/2018 0958   HCO3 37.3 (H) 05/16/2018 0958   TCO2 29 02/04/2018 1402   O2SAT 98.8 05/16/2018 0958  Coagulation Profile: No results for input(s): INR, PROTIME in the last 168 hours.  Cardiac Enzymes: Recent Labs  Lab 05/23/18 0431  CKTOTAL 26*    HbA1C: Hgb A1c MFr Bld  Date/Time Value Ref Range Status  03/02/2018 04:43 PM 5.2 4.8 - 5.6 % Final    Comment:             Prediabetes: 5.7 - 6.4          Diabetes: >6.4          Glycemic control for adults with diabetes: <7.0     CBG: No results for input(s): GLUCAP in the last 168 hours.  Review of Systems:   Marland KitchenMarland KitchenReview of Systems  Constitutional: Positive for diaphoresis and malaise/fatigue. Negative for chills.  HENT: Negative.   Respiratory: Positive for cough, sputum production and shortness of breath. Negative for wheezing.   Cardiovascular: Positive for leg swelling. Negative for palpitations.  Gastrointestinal: Negative for abdominal pain.  Genitourinary: Negative.   Musculoskeletal: Negative.   Neurological: Positive for weakness. Negative for tremors, focal weakness and headaches.  Psychiatric/Behavioral: Positive for depression.     Past Medical History  She,  has a past medical history of Adenomatous polyps (12/14), Arthritis, Chronic kidney disease, Depression, Diabetes mellitus without complication ([place]), Hirsutism, Hyperlipidemia, Hypertension, Murmur, Obesity, Situational stress, and Stroke ([place]).   Surgical History    Past Surgical History:  Procedure Laterality Date  . CESAREAN SECTION  12/23/1980  . COLONOSCOPY  06/2005-09/2007  . DILATION AND CURETTAGE OF UTERUS    . RIGHT/LEFT HEART CATH AND CORONARY ANGIOGRAPHY N/A 02/04/2018   Procedure: RIGHT/LEFT HEART CATH AND CORONARY ANGIOGRAPHY;  Surgeon: Nelva Bush, MD;  Location: [facility];  Service: Cardiovascular;   Laterality: N/A;  . [place] History   reports that she quit smoking about 24 years ago. She has never used smokeless tobacco. She reports current alcohol use. She reports that she does not use drugs.   Family History   Her family history includes CAD in her father; Diabetes in her brother, mother, and sister; Heart attack in her father; Heart failure in her mother; Hypertension in her brother, father, mother, and sister; Skin cancer in her father.   Allergies Allergies  Allergen Reactions  . Lisinopril Cough     Home Medications  Prior to Admission medications   Medication Sig Start Date End Date Taking? Authorizing Provider  Amino Acids-Protein Hydrolys (FEEDING SUPPLEMENT, PRO-STAT SUGAR FREE 64,) LIQD Take 30 mLs by mouth 2 (two) times daily. 05/19/18  Yes Sheikh, Omair Latif, DO  buPROPion (WELLBUTRIN XL) 150 MG 24 hr tablet Take 150 mg by mouth daily.   Yes [provider]  clonazePAM (KLONOPIN) 0.5 MG tablet Take 1 tablet (0.5 mg total) by mouth 2 (two) times daily as needed for anxiety. 05/19/18  Yes Sheikh, Omair Latif, DO  clopidogrel (PLAVIX) 75 MG tablet Take 1 tablet (75 mg total) by mouth daily. 03/24/18  Yes Garvin Fila, MD  dextromethorphan-guaiFENesin [facility] DM) 30-600 MG 12hr tablet Take 1 tablet by mouth 2 (two) times daily as needed for cough. 05/19/18  Yes Sheikh, Omair Latif, DO  furosemide (LASIX) 80 MG tablet Take 1 tablet (80 mg total) by mouth 2 (two) times daily. 05/19/18  Yes Sheikh, Omair Latif, DO  guaiFENesin-dextromethorphan (ROBITUSSIN DM) 100-10 MG/5ML syrup Take 20 mLs by mouth every 6 (six) hours as needed for cough. For 5 days , started on 05-20-18  Yes [provider]  hydrOXYzine (ATARAX/VISTARIL) 10 MG tablet Take 1 tablet (10 mg total) by mouth 3 (three) times daily as needed for nausea. 05/19/18  Yes Sheikh, Omair Latif, DO  ipratropium (ATROVENT) 0.02 % nebulizer solution Take 2.5 mLs (0.5  mg total) by nebulization every 6 (six) hours as needed for wheezing or shortness of breath. 05/19/18  Yes Sheikh, Omair Latif, DO  iron polysaccharides (NIFEREX) 150 MG capsule Take 150 mg by mouth daily.   Yes [provider]  levalbuterol (XOPENEX) 0.63 MG/3ML nebulizer solution Take 3 mLs (0.63 mg total) by nebulization every 6 (six) hours as needed for wheezing or shortness of breath. 05/19/18  Yes Sheikh, Omair Latif, DO  metFORMIN (GLUCOPHAGE) 1000 MG tablet Take 1,000 mg by mouth 2 (two) times daily with a meal.   Yes [provider]  metoprolol succinate (TOPROL-XL) 25 MG 24 hr tablet Take 0.5 tablets (12.5 mg total) by mouth daily. 05/20/18  Yes Sheikh, Omair Latif, DO  mirabegron ER (MYRBETRIQ) 25 MG TB24 tablet Take 25 mg by mouth daily.   Yes [provider]  Multiple Vitamins-Minerals (CENTRUM SILVER PO) Take by mouth.   Yes [provider]  nystatin (NYSTATIN) powder Apply 1 g topically 2 (two) times daily as needed (rash).   Yes [provider]  omeprazole (PRILOSEC) 40 MG capsule TAKE (1) CAPSULE BY MOUTH AT BEDTIME. Patient taking differently: Take 20 mg by mouth daily.  04/15/18  Yes Lendon Colonel, NP  potassium chloride SA (K-DUR,KLOR-CON) 20 MEQ tablet Take 1 tablet (20 mEq total) by mouth 2 (two) times daily. 05/19/18  Yes Sheikh, Omair Latif, DO  predniSONE (STERAPRED UNI-PAK 21 TAB) 10 MG (21) TBPK tablet Take 6 tablets on day 1, 5 tablets on day 2, 4 tablets on day 3, 3 tablets on day 4, 2 tablets on day 5, 1 tablet on day 6, and then stop on day 7. 05/19/18  Yes Sheikh, Omair Latif, DO  senna-docusate (SENOKOT-S) 8.6-50 MG tablet Take 1 tablet by mouth 2 (two) times daily. 05/19/18  Yes Sheikh, Omair Latif, DO  silver sulfADIAZINE (SILVADENE) 1 % cream Apply 1 application topically daily. Left lower leg   Yes [provider]  venlafaxine (EFFEXOR) 37.5 MG tablet Take 37.5-75 mg by mouth See admin instructions. Taking 2  tablets (28m) in the Am and 1 tablet (37.541m at night   Yes [provider]  Maltodextrin-Xanthan Gum (RESOURCE THICKENUP CLEAR) POWD Take 120 g by mouth as needed. 05/19/18   ShKerney ElbeDO      I, Dr KrSeward Carolave personally reviewed patient's available data, including medical history, events of note, physical examination and test results as part of my evaluation. I have discussed with other care providers such as pharmacist, RN and Elink.  In addition,  I personally evaluated patient  The patient is critically ill with multiple organ systems failure and requires high complexity decision making for assessment and support, frequent evaluation and titration of therapies, application of advanced monitoring technologies and extensive interpretation of multiple databases.   Critical Care Time devoted to patient care services described in this note is   75 Minutes. This time reflects time of care of this signee Dr KrSeward CarolThis critical care time does not reflect procedure time, or teaching time or supervisory time but could involve care discussion time   Dr. KrSeward Carolulmonary Critical Care Medicine  05/23/2018 11:00 PM  Critical care time: 75 mins

## 2018-05-23 NOTE — Progress Notes (Signed)
LAB UPDATE:  Lab called stating patient is COVID POSITIVE --Results to be faxed

## 2018-05-23 NOTE — Progress Notes (Signed)
Addendum  Patient having frequent episodes of NSSVT in the 160's. I d/w RN, pt is asymptomatic and in no distress, BP 117/94.  I personally reviewed tele and confirmed PSVT.  Resumed prior home dose of Toprol XL 12.5 mg daily and closely monitor on telemetry.  Discussed with RN.  Vernell Leep, MD, FACP, [facility]. [facility]  To contact the attending provider between 7A-7P or the covering provider during after hours 7P-7A, please log into the web site [email or web address] and access using universal [place] password for that web site. If you do not have the password, please call the hospital operator.

## 2018-05-24 DIAGNOSIS — I5033 Acute on chronic diastolic (congestive) heart failure: Secondary | ICD-10-CM

## 2018-05-24 LAB — POCT I-STAT 7, (LYTES, BLD GAS, ICA,H+H)
Acid-Base Excess: 14 mmol/L — ABNORMAL HIGH (ref 0.0–2.0)
Acid-Base Excess: 15 mmol/L — ABNORMAL HIGH (ref 0.0–2.0)
Bicarbonate: 39.9 mmol/L — ABNORMAL HIGH (ref 20.0–28.0)
Bicarbonate: 40.5 mmol/L — ABNORMAL HIGH (ref 20.0–28.0)
Calcium, Ion: 1.07 mmol/L — ABNORMAL LOW (ref 1.15–1.40)
Calcium, Ion: 1.08 mmol/L — ABNORMAL LOW (ref 1.15–1.40)
HCT: 30 % — ABNORMAL LOW (ref 36.0–46.0)
HCT: 29 % — ABNORMAL LOW (ref 36.0–46.0)
Hemoglobin: 10.2 g/dL — ABNORMAL LOW (ref 12.0–15.0)
Hemoglobin: 9.9 g/dL — ABNORMAL LOW (ref 12.0–15.0)
O2 Saturation: 100 %
O2 Saturation: 100 %
Patient temperature: 99.2
Patient temperature: 98.3
Potassium: 3 mmol/L — ABNORMAL LOW (ref 3.5–5.1)
Potassium: 3.1 mmol/L — ABNORMAL LOW (ref 3.5–5.1)
Sodium: 142 mmol/L (ref 135–145)
Sodium: 142 mmol/L (ref 135–145)
TCO2: 42 mmol/L — ABNORMAL HIGH (ref 22–32)
TCO2: 42 mmol/L — ABNORMAL HIGH (ref 22–32)
pCO2 arterial: 60.4 mmHg — ABNORMAL HIGH (ref 32.0–48.0)
pCO2 arterial: 56.4 mmHg — ABNORMAL HIGH (ref 32.0–48.0)
pH, Arterial: 7.429 (ref 7.350–7.450)
pH, Arterial: 7.464 — ABNORMAL HIGH (ref 7.350–7.450)
pO2, Arterial: 194 mmHg — ABNORMAL HIGH (ref 83.0–108.0)
pO2, Arterial: 196 mmHg — ABNORMAL HIGH (ref 83.0–108.0)

## 2018-05-24 LAB — C-REACTIVE PROTEIN: CRP: 25.2 mg/dL — ABNORMAL HIGH (ref <1.0)

## 2018-05-24 LAB — COMPREHENSIVE METABOLIC PANEL
ALT: 50 U/L — ABNORMAL HIGH (ref 0–44)
AST: 63 U/L — ABNORMAL HIGH (ref 15–41)
Albumin: 2.2 g/dL — ABNORMAL LOW (ref 3.5–5.0)
Alkaline Phosphatase: 84 U/L (ref 38–126)
Anion gap: 11 (ref 5–15)
BUN: 32 mg/dL — ABNORMAL HIGH (ref 8–23)
CO2: 35 mmol/L — ABNORMAL HIGH (ref 22–32)
Calcium: 7.9 mg/dL — ABNORMAL LOW (ref 8.9–10.3)
Chloride: 95 mmol/L — ABNORMAL LOW (ref 98–111)
Creatinine, Ser: 1.27 mg/dL — ABNORMAL HIGH (ref 0.44–1.00)
GFR calc Af Amer: 51 mL/min — ABNORMAL LOW (ref >60)
GFR calc non Af Amer: 44 mL/min — ABNORMAL LOW (ref >60)
Glucose, Bld: 92 mg/dL (ref 70–99)
Potassium: 3.5 mmol/L (ref 3.5–5.1)
Sodium: 141 mmol/L (ref 135–145)
Total Bilirubin: 2.2 mg/dL — ABNORMAL HIGH (ref 0.3–1.2)
Total Protein: 5.9 g/dL — ABNORMAL LOW (ref 6.5–8.1)

## 2018-05-24 LAB — GLUCOSE, CAPILLARY
Glucose-Capillary: 93 mg/dL (ref 70–99)
Glucose-Capillary: 95 mg/dL (ref 70–99)
Glucose-Capillary: 107 mg/dL — ABNORMAL HIGH (ref 70–99)
Glucose-Capillary: 126 mg/dL — ABNORMAL HIGH (ref 70–99)
Glucose-Capillary: 151 mg/dL — ABNORMAL HIGH (ref 70–99)
Glucose-Capillary: 155 mg/dL — ABNORMAL HIGH (ref 70–99)

## 2018-05-24 LAB — CBC WITH DIFFERENTIAL/PLATELET
Abs Immature Granulocytes: 0 10*3/uL (ref 0.00–0.07)
Basophils Absolute: 0 10*3/uL (ref 0.0–0.1)
Basophils Relative: 0 %
Eosinophils Absolute: 0 10*3/uL (ref 0.0–0.5)
Eosinophils Relative: 0 %
HCT: 29.4 % — ABNORMAL LOW (ref 36.0–46.0)
Hemoglobin: 8.8 g/dL — ABNORMAL LOW (ref 12.0–15.0)
Lymphocytes Relative: 6 %
Lymphs Abs: 0.5 10*3/uL — ABNORMAL LOW (ref 0.7–4.0)
MCH: 26 pg (ref 26.0–34.0)
MCHC: 29.9 g/dL — ABNORMAL LOW (ref 30.0–36.0)
MCV: 86.7 fL (ref 80.0–100.0)
Monocytes Absolute: 0 10*3/uL — ABNORMAL LOW (ref 0.1–1.0)
Monocytes Relative: 0 %
Neutro Abs: 7.5 10*3/uL (ref 1.7–7.7)
Neutrophils Relative %: 94 %
Platelets: 195 10*3/uL (ref 150–400)
RBC: 3.39 MIL/uL — ABNORMAL LOW (ref 3.87–5.11)
RDW: 18.2 % — ABNORMAL HIGH (ref 11.5–15.5)
WBC: 8 10*3/uL (ref 4.0–10.5)
nRBC: 0 % (ref 0.0–0.2)
nRBC: 0 /100 WBC

## 2018-05-24 LAB — PHOSPHORUS
Phosphorus: 3.6 mg/dL (ref 2.5–4.6)
Phosphorus: 3.6 mg/dL (ref 2.5–4.6)
Phosphorus: 2.2 mg/dL — ABNORMAL LOW (ref 2.5–4.6)

## 2018-05-24 LAB — MAGNESIUM
Magnesium: 1.8 mg/dL (ref 1.7–2.4)
Magnesium: 2.1 mg/dL (ref 1.7–2.4)
Magnesium: 1.9 mg/dL (ref 1.7–2.4)

## 2018-05-24 LAB — CK: Total CK: 20 U/L — ABNORMAL LOW (ref 38–234)

## 2018-05-24 LAB — VANCOMYCIN, RANDOM: Vancomycin Rm: 18

## 2018-05-24 LAB — MRSA PCR SCREENING: MRSA by PCR: NEGATIVE

## 2018-05-24 MED ORDER — LEVALBUTEROL HCL 0.63 MG/3ML IN NEBU
0.6300 mg | INHALATION_SOLUTION | Freq: Four times a day (QID) | RESPIRATORY_TRACT | Status: DC
  Administered 2018-05-24 – 2018-06-03 (×43): 0.63 mg via RESPIRATORY_TRACT
  Filled 2018-05-24 (×43): qty 3

## 2018-05-24 MED ORDER — FUROSEMIDE 40 MG PO TABS: 40.0000 mg | ORAL_TABLET | Freq: Two times a day (BID) | ORAL | Status: DC

## 2018-05-24 MED ORDER — CHLORHEXIDINE GLUCONATE 0.12% ORAL RINSE (MEDLINE KIT)
15.0000 mL | Freq: Two times a day (BID) | OROMUCOSAL | Status: DC
  Administered 2018-05-24 – 2018-06-15 (×45): 15 mL via OROMUCOSAL

## 2018-05-24 MED ORDER — CLOPIDOGREL BISULFATE 75 MG PO TABS: 75.0000 mg | ORAL_TABLET | Freq: Every day | ORAL | Status: DC

## 2018-05-24 MED ORDER — PHENYLEPHRINE HCL-NACL 10-0.9 MG/250ML-% IV SOLN
0.0000 ug/min | INTRAVENOUS | Status: DC
  Filled 2018-05-24: qty 250

## 2018-05-24 MED ORDER — CLOPIDOGREL BISULFATE 75 MG PO TABS
75.0000 mg | ORAL_TABLET | Freq: Every day | ORAL | Status: DC
  Administered 2018-05-24 – 2018-05-26 (×3): 75 mg
  Filled 2018-05-24 (×3): qty 1

## 2018-05-24 MED ORDER — ADULT MULTIVITAMIN W/MINERALS CH: 1.0000 | ORAL_TABLET | Freq: Every day | ORAL | Status: DC

## 2018-05-24 MED ORDER — IPRATROPIUM BROMIDE 0.02 % IN SOLN
0.5000 mg | Freq: Four times a day (QID) | RESPIRATORY_TRACT | Status: DC
  Administered 2018-05-24 – 2018-06-03 (×43): 0.5 mg via RESPIRATORY_TRACT
  Filled 2018-05-24 (×47): qty 2.5

## 2018-05-24 MED ORDER — INSULIN ASPART 100 UNIT/ML ~~LOC~~ SOLN
2.0000 [IU] | SUBCUTANEOUS | Status: DC
  Administered 2018-05-24: 2 [IU] via SUBCUTANEOUS
  Administered 2018-05-24 (×2): 4 [IU] via SUBCUTANEOUS
  Administered 2018-05-25: 13:00:00 2 [IU] via SUBCUTANEOUS
  Administered 2018-05-25 (×2): 4 [IU] via SUBCUTANEOUS
  Administered 2018-05-25 – 2018-05-26 (×4): 2 [IU] via SUBCUTANEOUS

## 2018-05-24 MED ORDER — ORAL CARE MOUTH RINSE
15.0000 mL | OROMUCOSAL | Status: DC
  Administered 2018-05-24 – 2018-06-15 (×219): 15 mL via OROMUCOSAL

## 2018-05-24 MED ORDER — ADULT MULTIVITAMIN W/MINERALS CH
1.0000 | ORAL_TABLET | Freq: Every day | ORAL | Status: DC
  Administered 2018-05-24 – 2018-05-26 (×3): 1
  Filled 2018-05-24 (×2): qty 1

## 2018-05-24 MED ORDER — ENOXAPARIN SODIUM 60 MG/0.6ML ~~LOC~~ SOLN
60.0000 mg | SUBCUTANEOUS | Status: DC
  Administered 2018-05-24 – 2018-06-14 (×23): 60 mg via SUBCUTANEOUS
  Filled 2018-05-24 (×24): qty 0.6

## 2018-05-24 MED ORDER — FUROSEMIDE 10 MG/ML IJ SOLN
40.0000 mg | Freq: Two times a day (BID) | INTRAMUSCULAR | Status: AC
  Administered 2018-05-24 (×2): 40 mg via INTRAVENOUS
  Filled 2018-05-24 (×2): qty 4

## 2018-05-24 NOTE — Progress Notes (Signed)
Addendum/Late entry  Late evening yesterday, just before 7p I was paged by RN stating that patient was again going in and out of PSVT frequently, had normal BP, wasn't symptomatic or in distress and was persistently hypoxic and unable to wean down O2. I advised additional Toprol 12.5 mg one time dose to bring total dose to 25 mg for the day and continue Toprol 25 mg daily beginning 4/5. I consulted and discussed with PCCM to evaluate asap d/t persistent hypoxia with high O2 needs. I then got a call from ID advising that patient ruled in for Covid-19. I called PCCM again, now just after 7p to update status. I called patient's night RN to update care and PCCM were at bedside.  Patient was transferred to ICU last night, intubated and care taken over by PCCM. TRH will signoff at this time. Please re consult for any further assistance.  Vernell Leep, MD, FACP, [facility]. [facility]  To contact the attending provider between 7A-7P or the covering provider during after hours 7P-7A, please log into the web site [email or web address] and access using universal [place] password for that web site. If you do not have the password, please call the hospital operator.

## 2018-05-24 NOTE — Progress Notes (Signed)
Tele visit with a second son and his family provided by elink.

## 2018-05-24 NOTE — Progress Notes (Signed)
NAME:  DELBRA ZELLARS, MRN:  [id number], DOB:  Dec 19, 1950, LOS: 2 ADMISSION DATE:  06/17/2018, CONSULTATION DATE:  05/23/2018 REFERRING MD:  Glenna Durand, CHIEF COMPLAINT:  SVT hpoxemia, COVID 71 POS   Brief History   68 yr old female with PMHx HFpEF, COPD, w/ bilateral infiltrates COVID positive on supplemental oxygen 8L with increased work of breathing, fatigue, and  Diaphoretic. PCCM consulted for acute respiratory distress.  History of present illness   68 yr old female with PMHx HFpEF, COPD, MV stenosis, AV stensosis, OSA, HLD, HTN, CKD stage III, intermittent confusion and delirium ( per last admission) and DM presented on 05/06/2018 for shortness of breath which was linked to CHF exacerbation ( BNP 1137) and treated with diuresis. Wound care was consulted for left lateral lower extremity with green drainage and maceration. Her renal function declined post diuresis and pt had abnormal LFTs, and further complicated by dysphagia/aspiration. On this admission she had leukopenia. Pt was discharged to SNF on 05/19/2018.  There is a DNR documented on paper chart signed by son [name] and dated 05/19/2018.  On 06/09/2018 pt was seen in [place] from [place] for SOB Sat 90% on room air she was started on NRB mask at 15L. Also noted in chart she was in SVT at 160 and hypotensive.  Pt received adenosine for cardioversion and went into sinus rhythm in the 80s-90s and was started on Lopressor. She was evaluated by [facility] and patient was noted to be leukopenic and non-contrast CT chest showed bilateral GGO with interstitial infiltrates -> r/o COVID    Past Medical History   . Adenomatous polyps 12/14  . Arthritis   . Chronic kidney disease   . Diabetes mellitus without complication ([employer])   . Hirsutism   . Murmur   . Obesity   . Situational stress      Significant Hospital Events   Respiratory distress: 05/23/2018  Consults:  PCCM 05/23/2018  Procedures:  Endotracheal intubation- 05/23/2018   Significant Diagnostic Tests:  Bilateral alveolar infiltrates on CXR  Micro Data:  Blood cx 05/24/2018- negative to date Urine culture from previous admission + E coli pan sensitive on 05/11/2018 Received 5 days of IV Ceftriaxone- complete Rx  Antimicrobials:  Vancomycin- 05/23/2018 Cefepime- 05/23/2018 Hydroxychloroquine- upon transfer to ICU -COVID Axithromycin - upon transfer to ICU- COVID  Interim history/subjective:  4/5 This am patient is arousable, on 12 peep 50%, decreased to 10 PEEP and 35% with abg pending, ? Chronic retainer with PCO2 55-60 this am nl pH.  Objective   Blood pressure 102/71, pulse 74, temperature 98.3 F (36.8 C), temperature source Oral, resp. rate (!) 22, height 5\' 8"  (1.727 m), weight 123.1 kg, SpO2 100 %.    Vent Mode: PRVC FiO2 (%):  [40 %-100 %] 50 % Set Rate:  [16 bmp-22 bmp] 22 bmp Vt Set:  [380 mL-500 mL] 380 mL PEEP:  [12 cmH20] 12 cmH20 Plateau Pressure:  [12 cmH20-21 cmH20] 21 cmH20   Intake/Output Summary (Last 24 hours) at 05/24/2018 1059 Last data filed at 05/24/2018 0900 Gross per 24 hour  Intake 1899.78 ml  Output 1035 ml  Net 864.78 ml   Filed Weights   05/23/18 0603 05/23/18 2100 05/24/18 0306  Weight: 121.3 kg 123.2 kg 123.1 kg    Examination: General: Elderly WF on vent, sedated HENT: Intubated, WNL Lungs: Clear Cardiovascular:RRR Abdomen: BS decreased with mild rlq tenderness Extremities: Unna boots in place Neuro: sedated GU: wnl  Resolved Hospital Problem list   Sinus  tach improved  Assessment & Plan:  1. Acute Hypoxic Respiratory Failure- ARDS -COVID Positive Plan: Early intubation in Negative pressure environment with full PPE RRT and RT helped to safely transport patient to ICU for endotracheal intubation Weaning vent to FIO2 of 35% and PEEP to 10 RASS goal 0 to -1 is achieved On continuous fentanyl with prn versed Received RSI for intubation ABG post intubation - 7.45/62/476/42- titrate down FiO2 first  Repeat  ABG in 1hr Place arterial line for ease of blood draws  2. COVID positive CRP 26 on last check Ferritin 1308 WBC 8.3 Lymphocytes 6 Plan: - start on Hydroxychloroquine Prior to initiation - EKG and QTc check - start on Azithromycin - start on Zinc Continues in Negative pressure room  3. SVT with Transient Hypotension Resolved Plan: Monitor  4. Transaminases Resolved  5. Bilateral lower ext wounds Due to the distinct possiblity of increasing edema as her hospital course drags on will remove unna boots and treat topically, wound care to see tomorrow.    Best practice:  Diet:tube feed Pain/Anxiety/Delirium protocol (if indicated): Continuous infusion VAP protocol (if indicated): NA DVT prophylaxis: SCD GI prophylaxis: AS above Glucose control: sliding scale Mobility: bed bound Code Status: full Family Communication:Pending Disposition:   Labs   CBC: Recent Labs  Lab 05/18/18 0408 05/19/18 0453 06/11/2018 1400 06/04/2018 1937 05/23/18 0431 05/23/18 2138 05/24/18 0151 05/24/18 0341 05/24/18 1024  WBC 6.9 8.7 13.7* 8.5 8.3  --   --  8.0  --   NEUTROABS 6.2 7.9* 13.2*  --  7.7  --   --  7.5  --   HGB 9.3* 9.0* 9.9* 8.8* 9.3* 10.2* 10.2* 8.8* 9.9*  HCT 31.7* 30.3* 34.5* 30.0* 32.4* 30.0* 30.0* 29.4* 29.0*  MCV 88.5 87.8 86.3 85.0 86.4  --   --  86.7  --   PLT 184 162 266 208 204  --   --  195  --     Basic Metabolic Panel: Recent Labs  Lab 05/18/18 0408 05/19/18 0453 06/14/2018 1400 06/05/2018 1937 05/23/18 0431 05/23/18 2138 05/24/18 0040 05/24/18 0151 05/24/18 0341 05/24/18 0342 05/24/18 1024  NA 143 139 142  --  142 141  --  142 141  --  142  K 4.1 3.8 3.2*  --  3.7 3.2*  --  3.0* 3.5  --  3.1*  CL 98 93* 95*  --  95*  --   --   --  95*  --   --   CO2 33* 38* 34*  --  32  --   --   --  35*  --   --   GLUCOSE 157* 155* 108*  --  97  --   --   --  92  --   --   BUN 30* 32* 27*  --  32*  --   --   --  32*  --   --   CREATININE 1.18* 1.22* 1.25* 1.28* 1.27*   --   --   --  1.27*  --   --   CALCIUM 8.7* 8.2* 8.4*  --  8.2*  --   --   --  7.9*  --   --   MG 2.2 2.0 1.9  --  1.9  --  1.8  --   --  2.1  --   PHOS 4.1 3.4  --   --   --   --  3.6  --  3.6  --   --  GFR: Estimated Creatinine Clearance: 59.4 mL/min (A) (by C-G formula based on SCr of 1.27 mg/dL (H)). Recent Labs  Lab 06/02/2018 1400 06/14/2018 1650 05/21/2018 1937 05/23/18 0431 05/24/18 0341  PROCALCITON  --   --  0.32  --   --   WBC 13.7*  --  8.5 8.3 8.0  LATICACIDVEN 2.5* 1.7  --   --   --     Liver Function Tests: Recent Labs  Lab 05/18/18 0408 05/19/18 0453 06/12/2018 1400 05/23/18 0431 05/24/18 0341  AST 192* 114* 83* 70* 63*  ALT 214* 157* 78* 65* 50*  ALKPHOS 139* 123 110 94 84  BILITOT 1.2 1.1 2.4* 1.9* 2.2*  PROT 6.8 6.4* 6.8 6.3* 5.9*  ALBUMIN 2.8* 2.7* 2.7* 2.5* 2.2*   No results for input(s): LIPASE, AMYLASE in the last 168 hours. No results for input(s): AMMONIA in the last 168 hours.  ABG    Component Value Date/Time   PHART 7.464 (H) 05/24/2018 1024   PCO2ART 56.4 (H) 05/24/2018 1024   PO2ART 196.0 (H) 05/24/2018 1024   HCO3 40.5 (H) 05/24/2018 1024   TCO2 42 (H) 05/24/2018 1024   O2SAT 100.0 05/24/2018 1024     Coagulation Profile: No results for input(s): INR, PROTIME in the last 168 hours.  Cardiac Enzymes: Recent Labs  Lab 05/23/18 0431 05/24/18 0341  CKTOTAL 26* 20*    HbA1C: Hgb A1c MFr Bld  Date/Time Value Ref Range Status  03/02/2018 04:43 PM 5.2 4.8 - 5.6 % Final    Comment:             Prediabetes: 5.7 - 6.4          Diabetes: >6.4          Glycemic control for adults with diabetes: <7.0     CBG: Recent Labs  Lab 05/23/18 2116 05/24/18 0046 05/24/18 0356 05/24/18 0822  GLUCAP 90 93 95 107*    Review of Systems:   NA  Past Medical History  She,  has a past medical history of Adenomatous polyps (12/14), Arthritis, Chronic kidney disease, Depression, Diabetes mellitus without complication ([place]), Hirsutism,  Hyperlipidemia, Hypertension, Murmur, Obesity, Situational stress, and Stroke ([place]).   Surgical History    Past Surgical History:  Procedure Laterality Date  . CESAREAN SECTION  12/23/1980  . COLONOSCOPY  06/2005-09/2007  . DILATION AND CURETTAGE OF UTERUS    . RIGHT/LEFT HEART CATH AND CORONARY ANGIOGRAPHY N/A 02/04/2018   Procedure: RIGHT/LEFT HEART CATH AND CORONARY ANGIOGRAPHY;  Surgeon: Nelva Bush, MD;  Location: [facility];  Service: Cardiovascular;  Laterality: N/A;  . [place] History   reports that she quit smoking about 24 years ago. She has never used smokeless tobacco. She reports current alcohol use. She reports that she does not use drugs.   Family History   Her family history includes CAD in her father; Diabetes in her brother, mother, and sister; Heart attack in her father; Heart failure in her mother; Hypertension in her brother, father, mother, and sister; Skin cancer in her father.   Allergies Allergies  Allergen Reactions  . Lisinopril Cough     Home Medications  Prior to Admission medications   Medication Sig Start Date End Date Taking? Authorizing Provider  Amino Acids-Protein Hydrolys (FEEDING SUPPLEMENT, PRO-STAT SUGAR FREE 64,) LIQD Take 30 mLs by mouth 2 (two) times daily. 05/19/18  Yes Sheikh, Omair Latif, DO  buPROPion (WELLBUTRIN XL) 150 MG 24 hr tablet  Take 150 mg by mouth daily.   Yes [provider]  clonazePAM (KLONOPIN) 0.5 MG tablet Take 1 tablet (0.5 mg total) by mouth 2 (two) times daily as needed for anxiety. 05/19/18  Yes Sheikh, Omair Latif, DO  clopidogrel (PLAVIX) 75 MG tablet Take 1 tablet (75 mg total) by mouth daily. 03/24/18  Yes Garvin Fila, MD  dextromethorphan-guaiFENesin [facility] DM) 30-600 MG 12hr tablet Take 1 tablet by mouth 2 (two) times daily as needed for cough. 05/19/18  Yes Sheikh, Omair Latif, DO  furosemide (LASIX) 80 MG tablet Take 1 tablet (80 mg total) by mouth 2  (two) times daily. 05/19/18  Yes Sheikh, Omair Latif, DO  guaiFENesin-dextromethorphan (ROBITUSSIN DM) 100-10 MG/5ML syrup Take 20 mLs by mouth every 6 (six) hours as needed for cough. For 5 days , started on 05-20-18   Yes [provider]  hydrOXYzine (ATARAX/VISTARIL) 10 MG tablet Take 1 tablet (10 mg total) by mouth 3 (three) times daily as needed for nausea. 05/19/18  Yes Sheikh, Omair Latif, DO  ipratropium (ATROVENT) 0.02 % nebulizer solution Take 2.5 mLs (0.5 mg total) by nebulization every 6 (six) hours as needed for wheezing or shortness of breath. 05/19/18  Yes Sheikh, Omair Latif, DO  iron polysaccharides (NIFEREX) 150 MG capsule Take 150 mg by mouth daily.   Yes [provider]  levalbuterol (XOPENEX) 0.63 MG/3ML nebulizer solution Take 3 mLs (0.63 mg total) by nebulization every 6 (six) hours as needed for wheezing or shortness of breath. 05/19/18  Yes Sheikh, Omair Latif, DO  metFORMIN (GLUCOPHAGE) 1000 MG tablet Take 1,000 mg by mouth 2 (two) times daily with a meal.   Yes [provider]  metoprolol succinate (TOPROL-XL) 25 MG 24 hr tablet Take 0.5 tablets (12.5 mg total) by mouth daily. 05/20/18  Yes Sheikh, Omair Latif, DO  mirabegron ER (MYRBETRIQ) 25 MG TB24 tablet Take 25 mg by mouth daily.   Yes [provider]  Multiple Vitamins-Minerals (CENTRUM SILVER PO) Take by mouth.   Yes [provider]  nystatin (NYSTATIN) powder Apply 1 g topically 2 (two) times daily as needed (rash).   Yes [provider]  omeprazole (PRILOSEC) 40 MG capsule TAKE (1) CAPSULE BY MOUTH AT BEDTIME. Patient taking differently: Take 20 mg by mouth daily.  04/15/18  Yes Lendon Colonel, NP  potassium chloride SA (K-DUR,KLOR-CON) 20 MEQ tablet Take 1 tablet (20 mEq total) by mouth 2 (two) times daily. 05/19/18  Yes Sheikh, Omair Latif, DO  predniSONE (STERAPRED UNI-PAK 21 TAB) 10 MG (21) TBPK tablet Take 6 tablets on day 1, 5 tablets on day 2, 4 tablets on day  3, 3 tablets on day 4, 2 tablets on day 5, 1 tablet on day 6, and then stop on day 7. 05/19/18  Yes Sheikh, Omair Latif, DO  senna-docusate (SENOKOT-S) 8.6-50 MG tablet Take 1 tablet by mouth 2 (two) times daily. 05/19/18  Yes Sheikh, Omair Latif, DO  silver sulfADIAZINE (SILVADENE) 1 % cream Apply 1 application topically daily. Left lower leg   Yes [provider]  venlafaxine (EFFEXOR) 37.5 MG tablet Take 37.5-75 mg by mouth See admin instructions. Taking 2 tablets (75mg ) in the Am and 1 tablet (37.5mg ) at night   Yes [provider]  Maltodextrin-Xanthan Gum (RESOURCE THICKENUP CLEAR) POWD Take 120 g by mouth as needed. 05/19/18   Kerney Elbe, DO     Discussed patients current clinical condition with husband Sonia Side, ansered questions to best of my ability.  Critical care time: 76

## 2018-05-24 NOTE — Procedures (Signed)
Arterial Catheter Insertion Procedure Note Shannon Carroll [id number] [birth year]  Procedure: Insertion of Arterial Catheter  Indications: Blood pressure monitoring and Frequent blood sampling  Procedure Details Consent: Unable to obtain consent because of emergent medical necessity. Time Out: Verified patient identification, verified procedure, site/side was marked, verified correct patient position, special equipment/implants available, medications/allergies/relevent history reviewed, required imaging and test results available.  Performed  Maximum sterile technique was used including antiseptics, cap, gloves, gown, hand hygiene, mask and sheet. Skin prep: Chlorhexidine; local anesthetic administered 20 gauge catheter was inserted into left radial artery using the Seldinger technique. ULTRASOUND GUIDANCE USED: NO Evaluation Blood flow good; BP tracing good. Complications: No apparent complications.   Shannon Carroll 05/24/2018

## 2018-05-24 NOTE — Progress Notes (Signed)
Tele vist with family provided by [facility]

## 2018-05-24 NOTE — Procedures (Signed)
Intubation Procedure Note Shannon Carroll [id number] 1950-08-05  Procedure: Intubation Indications: Respiratory insufficiency  Procedure Details Consent: Risks of procedure as well as the alternatives and risks of each were explained to the (patient/caregiver).  Consent for procedure obtained. Time Out: Verified patient identification, verified procedure, site/side was marked, verified correct patient position, special equipment/implants available, medications/allergies/relevent history reviewed, required imaging and test results available.  Performed  Maximum sterile technique was used including antiseptics, cap, gloves, gown, hand hygiene, mask and sheet.  MAC and 4  Fentanyl 100 mg. Etomidate 20 mg Versed 4 mg Rocouronium 100 mg    Evaluation Hemodynamic Status: BP stable throughout; O2 sats: stable throughout Patient's Current Condition: stable Complications: No apparent complications Patient did tolerate procedure well. Chest X-ray ordered to verify placement.  CXR: tube position acceptable.   Shannon Carroll 05/24/2018

## 2018-05-24 NOTE — Progress Notes (Signed)
Tele visit with son and his family provided by [facility].

## 2018-05-25 DIAGNOSIS — J80 Acute respiratory distress syndrome: Secondary | ICD-10-CM

## 2018-05-25 DIAGNOSIS — R7989 Other specified abnormal findings of blood chemistry: Secondary | ICD-10-CM

## 2018-05-25 DIAGNOSIS — J9601 Acute respiratory failure with hypoxia: Secondary | ICD-10-CM

## 2018-05-25 DIAGNOSIS — N183 Chronic kidney disease, stage 3 (moderate): Secondary | ICD-10-CM

## 2018-05-25 LAB — POCT I-STAT 7, (LYTES, BLD GAS, ICA,H+H)
Acid-Base Excess: 19 mmol/L — ABNORMAL HIGH (ref 0.0–2.0)
Bicarbonate: 44.5 mmol/L — ABNORMAL HIGH (ref 20.0–28.0)
Calcium, Ion: 1.06 mmol/L — ABNORMAL LOW (ref 1.15–1.40)
HCT: 33 % — ABNORMAL LOW (ref 36.0–46.0)
Hemoglobin: 11.2 g/dL — ABNORMAL LOW (ref 12.0–15.0)
O2 Saturation: 98 %
Patient temperature: 99.1
Potassium: 2.7 mmol/L — CL (ref 3.5–5.1)
Sodium: 142 mmol/L (ref 135–145)
TCO2: 46 mmol/L — ABNORMAL HIGH (ref 22–32)
pCO2 arterial: 54.3 mmHg — ABNORMAL HIGH (ref 32.0–48.0)
pH, Arterial: 7.522 — ABNORMAL HIGH (ref 7.350–7.450)
pO2, Arterial: 104 mmHg (ref 83.0–108.0)

## 2018-05-25 LAB — BASIC METABOLIC PANEL
Anion gap: 7 (ref 5–15)
BUN: 27 mg/dL — ABNORMAL HIGH (ref 8–23)
CO2: 37 mmol/L — ABNORMAL HIGH (ref 22–32)
Calcium: 7.8 mg/dL — ABNORMAL LOW (ref 8.9–10.3)
Chloride: 101 mmol/L (ref 98–111)
Creatinine, Ser: 1.08 mg/dL — ABNORMAL HIGH (ref 0.44–1.00)
GFR calc Af Amer: >60 mL/min (ref >60)
GFR calc non Af Amer: 53 mL/min — ABNORMAL LOW (ref >60)
Glucose, Bld: 127 mg/dL — ABNORMAL HIGH (ref 70–99)
Potassium: 3.7 mmol/L (ref 3.5–5.1)
Sodium: 145 mmol/L (ref 135–145)

## 2018-05-25 LAB — COMPREHENSIVE METABOLIC PANEL
ALT: 42 U/L (ref 0–44)
AST: 57 U/L — ABNORMAL HIGH (ref 15–41)
Albumin: 2.1 g/dL — ABNORMAL LOW (ref 3.5–5.0)
Alkaline Phosphatase: 89 U/L (ref 38–126)
Anion gap: 12 (ref 5–15)
BUN: 29 mg/dL — ABNORMAL HIGH (ref 8–23)
CO2: 36 mmol/L — ABNORMAL HIGH (ref 22–32)
Calcium: 7.8 mg/dL — ABNORMAL LOW (ref 8.9–10.3)
Chloride: 95 mmol/L — ABNORMAL LOW (ref 98–111)
Creatinine, Ser: 1.11 mg/dL — ABNORMAL HIGH (ref 0.44–1.00)
GFR calc Af Amer: 60 mL/min — ABNORMAL LOW (ref >60)
GFR calc non Af Amer: 51 mL/min — ABNORMAL LOW (ref >60)
Glucose, Bld: 152 mg/dL — ABNORMAL HIGH (ref 70–99)
Potassium: 2.8 mmol/L — ABNORMAL LOW (ref 3.5–5.1)
Sodium: 143 mmol/L (ref 135–145)
Total Bilirubin: 1.8 mg/dL — ABNORMAL HIGH (ref 0.3–1.2)
Total Protein: 6.2 g/dL — ABNORMAL LOW (ref 6.5–8.1)

## 2018-05-25 LAB — NOVEL CORONAVIRUS, NAA (HOSP ORDER, SEND-OUT TO REF LAB; TAT 18-24 HRS): SARS-CoV-2, NAA: DETECTED — AB

## 2018-05-25 LAB — CBC WITH DIFFERENTIAL/PLATELET
Abs Immature Granulocytes: 0 10*3/uL (ref 0.00–0.07)
Basophils Absolute: 0 10*3/uL (ref 0.0–0.1)
Basophils Relative: 0 %
Eosinophils Absolute: 0.1 10*3/uL (ref 0.0–0.5)
Eosinophils Relative: 1 %
HCT: 31.6 % — ABNORMAL LOW (ref 36.0–46.0)
Hemoglobin: 9.1 g/dL — ABNORMAL LOW (ref 12.0–15.0)
Lymphocytes Relative: 8 %
Lymphs Abs: 0.8 10*3/uL (ref 0.7–4.0)
MCH: 24.9 pg — ABNORMAL LOW (ref 26.0–34.0)
MCHC: 28.8 g/dL — ABNORMAL LOW (ref 30.0–36.0)
MCV: 86.6 fL (ref 80.0–100.0)
Monocytes Absolute: 0 10*3/uL — ABNORMAL LOW (ref 0.1–1.0)
Monocytes Relative: 0 %
Neutro Abs: 9.3 10*3/uL — ABNORMAL HIGH (ref 1.7–7.7)
Neutrophils Relative %: 91 %
Platelets: 231 10*3/uL (ref 150–400)
RBC: 3.65 MIL/uL — ABNORMAL LOW (ref 3.87–5.11)
RDW: 17.9 % — ABNORMAL HIGH (ref 11.5–15.5)
WBC: 10.2 10*3/uL (ref 4.0–10.5)
nRBC: 0 /100 WBC
nRBC: 0.2 % (ref 0.0–0.2)

## 2018-05-25 LAB — GLUCOSE, CAPILLARY
Glucose-Capillary: 154 mg/dL — ABNORMAL HIGH (ref 70–99)
Glucose-Capillary: 158 mg/dL — ABNORMAL HIGH (ref 70–99)
Glucose-Capillary: 146 mg/dL — ABNORMAL HIGH (ref 70–99)
Glucose-Capillary: 124 mg/dL — ABNORMAL HIGH (ref 70–99)
Glucose-Capillary: 150 mg/dL — ABNORMAL HIGH (ref 70–99)

## 2018-05-25 LAB — PHOSPHORUS: Phosphorus: 2.1 mg/dL — ABNORMAL LOW (ref 2.5–4.6)

## 2018-05-25 LAB — C-REACTIVE PROTEIN: CRP: 24.7 mg/dL — ABNORMAL HIGH (ref <1.0)

## 2018-05-25 LAB — MAGNESIUM: Magnesium: 1.8 mg/dL (ref 1.7–2.4)

## 2018-05-25 LAB — CK: Total CK: 24 U/L — ABNORMAL LOW (ref 38–234)

## 2018-05-25 MED ORDER — CLONAZEPAM 0.1 MG/ML ORAL SUSPENSION: 0.5000 mg | Freq: Two times a day (BID) | ORAL | Status: DC

## 2018-05-25 MED ORDER — K PHOS MONO-SOD PHOS DI & MONO 155-852-130 MG PO TABS
500.0000 mg | ORAL_TABLET | Freq: Once | ORAL | Status: AC
  Administered 2018-05-25: 500 mg
  Filled 2018-05-25: qty 2

## 2018-05-25 MED ORDER — POTASSIUM PHOSPHATE MONOBASIC 500 MG PO TABS
500.0000 mg | ORAL_TABLET | Freq: Once | ORAL | Status: DC
  Filled 2018-05-25: qty 1

## 2018-05-25 MED ORDER — POTASSIUM CHLORIDE 10 MEQ/100ML IV SOLN
10.0000 meq | INTRAVENOUS | Status: AC
  Administered 2018-05-25 (×4): 10 meq via INTRAVENOUS
  Filled 2018-05-25 (×4): qty 100

## 2018-05-25 MED ORDER — POTASSIUM CHLORIDE 20 MEQ/15ML (10%) PO SOLN
40.0000 meq | Freq: Once | ORAL | Status: AC
  Administered 2018-05-25: 40 meq
  Filled 2018-05-25: qty 30

## 2018-05-25 MED ORDER — VITAL HIGH PROTEIN PO LIQD
1000.0000 mL | ORAL | Status: DC
  Administered 2018-05-25 (×2): 1000 mL

## 2018-05-25 MED ORDER — FENTANYL 50 MCG/HR TD PT72
1.0000 | MEDICATED_PATCH | TRANSDERMAL | Status: DC
  Administered 2018-05-25: 1 via TRANSDERMAL
  Filled 2018-05-25: qty 1

## 2018-05-25 MED ORDER — MAGNESIUM SULFATE IN D5W 1-5 GM/100ML-% IV SOLN
1.0000 g | Freq: Once | INTRAVENOUS | Status: AC
  Administered 2018-05-25: 1 g via INTRAVENOUS
  Filled 2018-05-25: qty 100

## 2018-05-25 MED ORDER — CLONAZEPAM 0.5 MG PO TABS
0.5000 mg | ORAL_TABLET | Freq: Two times a day (BID) | ORAL | Status: DC
  Administered 2018-05-25 – 2018-05-26 (×3): 0.5 mg
  Filled 2018-05-25 (×3): qty 1

## 2018-05-25 NOTE — Progress Notes (Deleted)
This note also relates to the following rows which could not be included: BP - Cannot attach notes to unvalidated device data MAP (mmHg) - Cannot attach notes to unvalidated device data Pulse Rate - Cannot attach notes to unvalidated device data ECG Heart Rate - Cannot attach notes to unvalidated device data Resp - Cannot attach notes to unvalidated device data SpO2 - Cannot attach notes to unvalidated device data Arterial Line BP - Cannot attach notes to unvalidated device data Arterial Line MAP (mmHg) - Cannot attach notes to unvalidated device data

## 2018-05-25 NOTE — Progress Notes (Signed)
Initial Nutrition Assessment RD working remotely.  DOCUMENTATION CODES:   Morbid obesity  INTERVENTION:    Vital High Protein at 70 ml/h (1680 ml per day)  Provides 1680 kcal, 147 gm protein, 1404 ml free water daily  NUTRITION DIAGNOSIS:   Inadequate oral intake related to inability to eat as evidenced by NPO status.  GOAL:   Provide needs based on ASPEN/SCCM guidelines  MONITOR:   Vent status, TF tolerance, Labs, Skin, I & O's  REASON FOR ASSESSMENT:   Ventilator, Consult Enteral/tube feeding initiation and management  ASSESSMENT:   68 yo female with PMH of obesity, DM, HTN, HLD, CKD, stage 2, stroke who was admitted with SVT, hypoxemia, COVID-19 positive. Required intubation 4/4.  Received MD Consult for TF initiation and management. OGT in place.   Patient is currently intubated on ventilator support MV: 8.6 L/min Temp (24hrs), Avg:98.7 F (37.1 C), Min:98 F (36.7 C), Max:99.3 F (37.4 C)   Labs reviewed. Potassium 2.8 (L), BUN 29 (H), creatinine 1.11 (H), phosphorus 2.1 (L) CBG': 154-158 Medications reviewed and include Plaquenil, Novolog, MVI, KCl, Mag sulfate, zinc, fentanyl, Levophed.  Per RN documentation, patient has moderate pitting edema to RLE & LLE.  NUTRITION - FOCUSED PHYSICAL EXAM:  unable to complete  Diet Order:   Diet Order    None      EDUCATION NEEDS:   No education needs have been identified at this time  Skin:  Skin Assessment: Skin Integrity Issues: Skin Integrity Issues:: Other (Comment) Other: open blister L pretibial; MASD to breast, abdomen, groin  Last BM:  none documented since admission  Height:   Ht Readings from Last 1 Encounters:  05/23/18 5\' 8"  (1.727 m)    Weight:   Wt Readings from Last 1 Encounters:  05/25/18 123.5 kg    Ideal Body Weight:  63.6 kg  BMI:  Body mass index is 41.4 kg/m.  Estimated Nutritional Needs:   Kcal:  6606-0045  Protein:  130-160 gm  Fluid:  1.9 L    Molli Barrows, RD, LDN, [place] Pager [phone] After Hours Pager [phone]

## 2018-05-25 NOTE — Progress Notes (Signed)
Chaplain Note:   Received a request from patient's parish priest, Loss adjuster, chartered, to ask nurse to tell patient she that prayers were offered for her at [employer]. He indicated she has declined in health and while he cannot visit and he knows she has dementia that he wanted her to at least know she was being held in the prayers of the church.   Nurse was gracious in offering to tell the patient.   Wells Guiles  CHaplain. [phone]

## 2018-05-25 NOTE — Progress Notes (Deleted)
[facility] ADULT ICU REPLACEMENT PROTOCOL FOR AM LAB REPLACEMENT ONLY  The patient does apply for the [facility] Adult ICU Electrolyte Replacment Protocol based on the criteria listed below:   1. Is GFR >/= 40 ml/min? Yes.    Patient's GFR today is 51 2. Is urine output >/= 0.5 ml/kg/hr for the last 6 hours? Yes.   Patient's UOP is 0.76 ml/kg/hr 3. Is BUN < 60 mg/dL? Yes.    Patient's BUN today is 29 4. Abnormal electrolyte  K 2.8 5. Ordered repletion with: per protocol 6. If a panic level lab has been reported, has the CCM MD in charge been notified? Yes.  .   Physician:  Cecille Po 05/25/2018 5:43 AM

## 2018-05-25 NOTE — Progress Notes (Addendum)
NAME:  Shannon Carroll, MRN:  [id number], DOB:  [birth year], LOS: 3 ADMISSION DATE:  06/01/2018, CONSULTATION DATE:  05/23/2018 REFERRING MD:  Glenna Durand, CHIEF COMPLAINT:  SVT hpoxemia, COVID 92 POS   Brief History   68 yr old female with PMHx HFpEF, COPD, w/ bilateral infiltrates COVID positive on supplemental oxygen 8L with increased work of breathing, fatigue, and  Diaphoretic. PCCM consulted for acute respiratory distress.  Significant Hospital Events   Respiratory distress: 05/23/2018  Consults:    Procedures:  Endotracheal intubation- 05/23/2018  Significant Diagnostic Tests:  Bilateral alveolar infiltrates on CXR  Micro Data:  Blood cx 05/23/2018- negative to date Novel Coronavirus Positive result called 4/4.  Antimicrobials:  Vancomycin- 05/23/2018 Cefepime- 05/23/2018 Hydroxychloroquine- upon transfer to ICU -COVID Axithromycin - upon transfer to ICU- COVID  Interim history/subjective:  On minimal sedation and vent settings. No acute events overnight.   Objective   Blood pressure 99/71, pulse (!) 102, temperature 99.3 F (37.4 C), temperature source Oral, resp. rate 20, height 5\' 8"  (1.727 m), weight 123.5 kg, SpO2 93 %.    Vent Mode: PRVC FiO2 (%):  [30 %-35 %] 30 % Set Rate:  [22 bmp] 22 bmp Vt Set:  [380 mL] 380 mL PEEP:  [10 cmH20] 10 cmH20 Plateau Pressure:  [17 cmH20-21 cmH20] 17 cmH20   Intake/Output Summary (Last 24 hours) at 05/25/2018 1005 Last data filed at 05/25/2018 1000 Gross per 24 hour  Intake 2326.28 ml  Output 4975 ml  Net -2648.72 ml   Filed Weights   05/23/18 2100 05/24/18 0306 05/25/18 0317  Weight: 123.2 kg 123.1 kg 123.5 kg    Examination: General: Elderly obese female on vent HENT: /AT, PERRL, ETT Lungs: Clear bilateral Cardiovascular:RRR, borederline tachy.  Abdomen: Soft, non-distended. Hypoactive.  Extremities: So acute deforimty or significant edema.  Neuro: Agitated on exam. Would be reaching for tube if restraints not in place.  GU  Foley  Resolved Hospital Problem list   Sinus tach improved, Transaminits.   Assessment & Plan:   Acute Hypoxic Respiratory Failure- ARDS -COVID Positive. R/o bacterial co-infection.  - Full vent support (PRVC, f22, Vt 6cc/Kg, Fio2 35, PEEP 10) - RASS goal 0 to -1 is achieved with fentanyl infusion alone. PRN versed.  - Will attempt to wean PEEP to 8.  - continue Hydroxychloroquine: follow QTC - Conitnue Azithromycin, Cefepime. DC vancomycin.  - Continue Zinc PO - Airborne/contact precautions.  - Follow CRP, Ferritin, LDH - PCXR in AM.   Shock septic secondary to COVID-19 -  Now off pressors but BP remains borderline. PRN norepi.   Diabetes mellitus -Follow glucose on Chemistry. If consecutive values over 180 will need to start SSI.   AKI on CKD: 2L neg for admission Hypokalemia - Follow daily BMP, repeat later today as well.  - Hold further diuresis.  - K supplementation - Phos supplementation - Mg supplementation.   Acute metabolic encephalopathy in the setting of respiratory failure. Acute delirium. - Continue fentanyl infusion for RASS -0 to -1.  - Add scheduled PO benzo to facilitate weaning when able.    Best practice:  Diet:tube feed Pain/Anxiety/Delirium protocol (if indicated): Continuous infusion VAP protocol (if indicated): NA DVT prophylaxis: SCD GI prophylaxis: Protonix Glucose control: sliding scale Mobility: bed bound Code Status: Partial code Family Communication: Pending Disposition: Critically ill in ICU.   Labs   CBC: Recent Labs  Lab 05/19/18 0453 05/26/2018 1400 06/14/2018 1937 05/23/18 0431  05/24/18 0151 05/24/18 0341 05/24/18 1024 05/25/18 5035 05/25/18 4656  WBC 8.7 13.7* 8.5 8.3  --   --  8.0  --   --  10.2  NEUTROABS 7.9* 13.2*  --  7.7  --   --  7.5  --   --  9.3*  HGB 9.0* 9.9* 8.8* 9.3*   < > 10.2* 8.8* 9.9* 11.2* 9.1*  HCT 30.3* 34.5* 30.0* 32.4*   < > 30.0* 29.4* 29.0* 33.0* 31.6*  MCV 87.8 86.3 85.0 86.4  --   --  86.7  --    --  86.6  PLT 162 266 208 204  --   --  195  --   --  231   < > = values in this interval not displayed.    Basic Metabolic Panel: Recent Labs  Lab 05/19/18 0453 06/17/2018 1400 06/05/2018 1937 05/23/18 0431  05/24/18 0040 05/24/18 0151 05/24/18 0341 05/24/18 0342 05/24/18 1024 05/24/18 1821 05/25/18 0237 05/25/18 0242  NA 139 142  --  142   < >  --  142 141  --  142  --  142 143  K 3.8 3.2*  --  3.7   < >  --  3.0* 3.5  --  3.1*  --  2.7* 2.8*  CL 93* 95*  --  95*  --   --   --  95*  --   --   --   --  95*  CO2 38* 34*  --  32  --   --   --  35*  --   --   --   --  36*  GLUCOSE 155* 108*  --  97  --   --   --  92  --   --   --   --  152*  BUN 32* 27*  --  32*  --   --   --  32*  --   --   --   --  29*  CREATININE 1.22* 1.25* 1.28* 1.27*  --   --   --  1.27*  --   --   --   --  1.11*  CALCIUM 8.2* 8.4*  --  8.2*  --   --   --  7.9*  --   --   --   --  7.8*  MG 2.0 1.9  --  1.9  --  1.8  --   --  2.1  --  1.9  --  1.8  PHOS 3.4  --   --   --   --  3.6  --  3.6  --   --  2.2*  --  2.1*   < > = values in this interval not displayed.   GFR: Estimated Creatinine Clearance: 68.1 mL/min (A) (by C-G formula based on SCr of 1.11 mg/dL (H)). Recent Labs  Lab 06/18/2018 1400 05/30/2018 1650 06/10/2018 1937 05/23/18 0431 05/24/18 0341 05/25/18 0242  PROCALCITON  --   --  0.32  --   --   --   WBC 13.7*  --  8.5 8.3 8.0 10.2  LATICACIDVEN 2.5* 1.7  --   --   --   --     Liver Function Tests: Recent Labs  Lab 05/19/18 0453 05/27/2018 1400 05/23/18 0431 05/24/18 0341 05/25/18 0242  AST 114* 83* 70* 63* 57*  ALT 157* 78* 65* 50* 42  ALKPHOS 123 110 94 84 89  BILITOT 1.1 2.4* 1.9* 2.2* 1.8*  PROT 6.4* 6.8 6.3* 5.9* 6.2*  ALBUMIN 2.7*  2.7* 2.5* 2.2* 2.1*   No results for input(s): LIPASE, AMYLASE in the last 168 hours. No results for input(s): AMMONIA in the last 168 hours.  ABG    Component Value Date/Time   PHART 7.522 (H) 05/25/2018 0237   PCO2ART 54.3 (H) 05/25/2018 0237    PO2ART 104.0 05/25/2018 0237   HCO3 44.5 (H) 05/25/2018 0237   TCO2 46 (H) 05/25/2018 0237   O2SAT 98.0 05/25/2018 0237     Coagulation Profile: No results for input(s): INR, PROTIME in the last 168 hours.  Cardiac Enzymes: Recent Labs  Lab 05/23/18 0431 05/24/18 0341 05/25/18 0242  CKTOTAL 26* 20* 24*    HbA1C: Hgb A1c MFr Bld  Date/Time Value Ref Range Status  03/02/2018 04:43 PM 5.2 4.8 - 5.6 % Final    Comment:             Prediabetes: 5.7 - 6.4          Diabetes: >6.4          Glycemic control for adults with diabetes: <7.0     CBG: Recent Labs  Lab 05/24/18 1113 05/24/18 1620 05/24/18 2115 05/25/18 0310 05/25/18 0848  GLUCAP 126* 151* 155* 154* 158*    Review of Systems:   NA  Past Medical History  She,  has a past medical history of Adenomatous polyps (12/14), Arthritis, Chronic kidney disease, Depression, Diabetes mellitus without complication ([place]), Hirsutism, Hyperlipidemia, Hypertension, Murmur, Obesity, Situational stress, and Stroke ([place]).   Surgical History    Past Surgical History:  Procedure Laterality Date  . CESAREAN SECTION  12/23/1980  . COLONOSCOPY  06/2005-09/2007  . DILATION AND CURETTAGE OF UTERUS    . RIGHT/LEFT HEART CATH AND CORONARY ANGIOGRAPHY N/A 02/04/2018   Procedure: RIGHT/LEFT HEART CATH AND CORONARY ANGIOGRAPHY;  Surgeon: Nelva Bush, MD;  Location: [facility];  Service: Cardiovascular;  Laterality: N/A;  . [place] History   reports that she quit smoking about 24 years ago. She has never used smokeless tobacco. She reports current alcohol use. She reports that she does not use drugs.   Family History   Her family history includes CAD in her father; Diabetes in her brother, mother, and sister; Heart attack in her father; Heart failure in her mother; Hypertension in her brother, father, mother, and sister; Skin cancer in her father.   Allergies Allergies  Allergen  Reactions  . Lisinopril Cough     Home Medications  Prior to Admission medications   Medication Sig Start Date End Date Taking? Authorizing Provider  Amino Acids-Protein Hydrolys (FEEDING SUPPLEMENT, PRO-STAT SUGAR FREE 64,) LIQD Take 30 mLs by mouth 2 (two) times daily. 05/19/18  Yes Sheikh, Omair Latif, DO  buPROPion (WELLBUTRIN XL) 150 MG 24 hr tablet Take 150 mg by mouth daily.   Yes [provider]  clonazePAM (KLONOPIN) 0.5 MG tablet Take 1 tablet (0.5 mg total) by mouth 2 (two) times daily as needed for anxiety. 05/19/18  Yes Sheikh, Omair Latif, DO  clopidogrel (PLAVIX) 75 MG tablet Take 1 tablet (75 mg total) by mouth daily. 03/24/18  Yes Garvin Fila, MD  dextromethorphan-guaiFENesin [facility] DM) 30-600 MG 12hr tablet Take 1 tablet by mouth 2 (two) times daily as needed for cough. 05/19/18  Yes Sheikh, Omair Latif, DO  furosemide (LASIX) 80 MG tablet Take 1 tablet (80 mg total) by mouth 2 (two) times daily. 05/19/18  Yes Sheikh, Omair Latif, DO  guaiFENesin-dextromethorphan [facility] DM)  100-10 MG/5ML syrup Take 20 mLs by mouth every 6 (six) hours as needed for cough. For 5 days , started on 05-20-18   Yes [provider]  hydrOXYzine (ATARAX/VISTARIL) 10 MG tablet Take 1 tablet (10 mg total) by mouth 3 (three) times daily as needed for nausea. 05/19/18  Yes Sheikh, Omair Latif, DO  ipratropium (ATROVENT) 0.02 % nebulizer solution Take 2.5 mLs (0.5 mg total) by nebulization every 6 (six) hours as needed for wheezing or shortness of breath. 05/19/18  Yes Sheikh, Omair Latif, DO  iron polysaccharides (NIFEREX) 150 MG capsule Take 150 mg by mouth daily.   Yes [provider]  levalbuterol (XOPENEX) 0.63 MG/3ML nebulizer solution Take 3 mLs (0.63 mg total) by nebulization every 6 (six) hours as needed for wheezing or shortness of breath. 05/19/18  Yes Sheikh, Omair Latif, DO  metFORMIN (GLUCOPHAGE) 1000 MG tablet Take 1,000 mg by mouth 2 (two) times daily with a meal.    Yes [provider]  metoprolol succinate (TOPROL-XL) 25 MG 24 hr tablet Take 0.5 tablets (12.5 mg total) by mouth daily. 05/20/18  Yes Sheikh, Omair Latif, DO  mirabegron ER (MYRBETRIQ) 25 MG TB24 tablet Take 25 mg by mouth daily.   Yes [provider]  Multiple Vitamins-Minerals (CENTRUM SILVER PO) Take by mouth.   Yes [provider]  nystatin (NYSTATIN) powder Apply 1 g topically 2 (two) times daily as needed (rash).   Yes [provider]  omeprazole (PRILOSEC) 40 MG capsule TAKE (1) CAPSULE BY MOUTH AT BEDTIME. Patient taking differently: Take 20 mg by mouth daily.  04/15/18  Yes Lendon Colonel, NP  potassium chloride SA (K-DUR,KLOR-CON) 20 MEQ tablet Take 1 tablet (20 mEq total) by mouth 2 (two) times daily. 05/19/18  Yes Sheikh, Omair Latif, DO  predniSONE (STERAPRED UNI-PAK 21 TAB) 10 MG (21) TBPK tablet Take 6 tablets on day 1, 5 tablets on day 2, 4 tablets on day 3, 3 tablets on day 4, 2 tablets on day 5, 1 tablet on day 6, and then stop on day 7. 05/19/18  Yes Sheikh, Omair Latif, DO  senna-docusate (SENOKOT-S) 8.6-50 MG tablet Take 1 tablet by mouth 2 (two) times daily. 05/19/18  Yes Sheikh, Omair Latif, DO  silver sulfADIAZINE (SILVADENE) 1 % cream Apply 1 application topically daily. Left lower leg   Yes [provider]  venlafaxine (EFFEXOR) 37.5 MG tablet Take 37.5-75 mg by mouth See admin instructions. Taking 2 tablets (75mg ) in the Am and 1 tablet (37.5mg ) at night   Yes [provider]  Maltodextrin-Xanthan Gum (RESOURCE THICKENUP CLEAR) POWD Take 120 g by mouth as needed. 05/19/18   Kerney Elbe, DO      Critical care time: 51 mins      Georgann Housekeeper, [facility] Pager [phone] or [phone]  05/25/2018 12:03 PM    PCCM Attending:   S: 68 yo FM, admitted for COVID 100 AHRF, intubated ARDs on vent.   O: BP 101/71   Pulse 87   Temp 99.3 F (37.4 C) (Oral)   Resp 15    Ht 5\' 8"  (1.727 m)   Wt 123.5 kg   SpO2 96%   BMI 41.40 kg/m   Gen: elderly FM, intubated, on vent Lungs: wave forms reviewed, vent settings reviewed  Cardiac: regular, on sinus, on tele reviewed   A: AHRF, ARDS COVID19  Shock  DMII  AKI on CKD   P: Stopping cefepime  Add duragesic patch  Add klonipin BID  titrating pressors maintain MAP >65 mmHg  Try to wean off continuous sedation  This patient is critically ill with multiple organ system failure; which, requires frequent high complexity decision making, assessment, support, evaluation, and titration of therapies. This was completed through the application of advanced monitoring technologies and extensive interpretation of multiple databases. During this encounter critical care time was devoted to patient care services described in this note for 32 minutes.   Garner Nash, DO [facility] 05/25/2018 3:42 PM  Personal pager: [phone] If unanswered, please page CCM On-call: [phone]

## 2018-05-25 NOTE — Consult Note (Addendum)
   [facility] CM Inpatient Consult   05/25/2018  Shannon Carroll [birth year] [id number]  Patient screened for extreme high risk score for unplanned readmissions and with less than 30 days hospitalizations from a skilled nursing facility [place].  Patient was hospitalized from MD charted progress notes as follows: Shannon Carroll is a 68 y.o. female with medical history significant of hypertension, hyperlipidemia, vascular dementia, diet-controlled diabetes, stroke, depression, anxiety, chronic kidney disease stage III, diastolic congestive heart failure recently discharged from our facility after treatment for same, obstructive sleep apnea not on CPAP, mitral valve stenosis, aortic valve stenosis who just discharged from our facility on March 31 after treatment for diastolic congestive heart failure, acute kidney injury, abnormal LFTs, recurrent aspiration and dysphasia with speech and language pathology recommending a regular diet with nectar thick liquids.  Usually patient was refusing those but says she has been compliant with nectar thick liquids at the skilled nursing facility.  She was discharged from our hospital on 2 L of nasal cannula supplemental oxygen after she desaturated to 87%.    Will follow for progress and disposition as  appropriate for post hospital follow up, if needed.  Please place a [facility] Care Management consult or for questions contact:   Natividad Brood, RN BSN [facility] Liaison  [phone] business mobile phone Toll free office [phone]

## 2018-05-25 NOTE — Progress Notes (Signed)
Pt is absolutely positive for COVID-19. Test was sent to [facility] and resulted as positive per Dr. Valeta Harms. See Dr. Storm Frisk note.

## 2018-05-25 NOTE — Consult Note (Addendum)
[facility] Nurse wound consult note Reason for Consult: Consult requested for legs.  Pt is in a Covid r/o isolation room, so consult was performed remotely using a camera and assistance from the bedside nurse for wound assessment and measurements.  Pt has been using Silvadene prior to admission, according to the [facility].  Plan to continue present plan of care. Bilat legs with slight edema and erythremia. Wound type: Bedside nurse assessed right leg and foot and did not note any wounds or drainage requiring topical treatment.   Left posterior leg with full thickness chronic stasis ulcer; approx 1X1cm, yellow moist wound bed, no odor, small amt yellow drainage. Plan: Continue Silvadene to provide enzymatic debridement of nonviable tissue to left leg wound.  Foam dressing to protect from further injury. Topical treatment orders provided for bedside nurses to perform daily. Please re-consult if further assistance is needed.  Thank-you,  Julien Girt MSN, [place], [place], [place], [place]

## 2018-05-25 NOTE — Progress Notes (Signed)
[facility] Progress Note Patient Name: Shannon Carroll DOB: 1950/05/06 MRN: [id number]   Date of Service  05/25/2018  HPI/Events of Note  Notified of low K, Mg.  K 2.8, Mg 1.8.  Crea 1.1  eICU Interventions  Replete K and Mg.       Intervention Category Intermediate Interventions: Electrolyte abnormality - evaluation and management  Elsie Lincoln 05/25/2018, 6:04 AM

## 2018-05-26 DIAGNOSIS — G9340 Encephalopathy, unspecified: Secondary | ICD-10-CM

## 2018-05-26 LAB — COMPREHENSIVE METABOLIC PANEL
ALT: 35 U/L (ref 0–44)
AST: 49 U/L — ABNORMAL HIGH (ref 15–41)
Albumin: 2.2 g/dL — ABNORMAL LOW (ref 3.5–5.0)
Alkaline Phosphatase: 81 U/L (ref 38–126)
Anion gap: 9 (ref 5–15)
BUN: 27 mg/dL — ABNORMAL HIGH (ref 8–23)
CO2: 37 mmol/L — ABNORMAL HIGH (ref 22–32)
Calcium: 8.2 mg/dL — ABNORMAL LOW (ref 8.9–10.3)
Chloride: 100 mmol/L (ref 98–111)
Creatinine, Ser: 1.13 mg/dL — ABNORMAL HIGH (ref 0.44–1.00)
GFR calc Af Amer: 58 mL/min — ABNORMAL LOW (ref >60)
GFR calc non Af Amer: 50 mL/min — ABNORMAL LOW (ref >60)
Glucose, Bld: 139 mg/dL — ABNORMAL HIGH (ref 70–99)
Potassium: 3.8 mmol/L (ref 3.5–5.1)
Sodium: 146 mmol/L — ABNORMAL HIGH (ref 135–145)
Total Bilirubin: 1.4 mg/dL — ABNORMAL HIGH (ref 0.3–1.2)
Total Protein: 6.7 g/dL (ref 6.5–8.1)

## 2018-05-26 LAB — CBC WITH DIFFERENTIAL/PLATELET
Abs Immature Granulocytes: 0.12 10*3/uL — ABNORMAL HIGH (ref 0.00–0.07)
Basophils Absolute: 0 10*3/uL (ref 0.0–0.1)
Basophils Relative: 0 %
Eosinophils Absolute: 0.3 10*3/uL (ref 0.0–0.5)
Eosinophils Relative: 3 %
HCT: 31.1 % — ABNORMAL LOW (ref 36.0–46.0)
Hemoglobin: 8.6 g/dL — ABNORMAL LOW (ref 12.0–15.0)
Immature Granulocytes: 1 %
Lymphocytes Relative: 9 %
Lymphs Abs: 0.9 10*3/uL (ref 0.7–4.0)
MCH: 24.5 pg — ABNORMAL LOW (ref 26.0–34.0)
MCHC: 27.7 g/dL — ABNORMAL LOW (ref 30.0–36.0)
MCV: 88.6 fL (ref 80.0–100.0)
Monocytes Absolute: 0.2 10*3/uL (ref 0.1–1.0)
Monocytes Relative: 2 %
Neutro Abs: 8.7 10*3/uL — ABNORMAL HIGH (ref 1.7–7.7)
Neutrophils Relative %: 85 %
Platelets: 254 10*3/uL (ref 150–400)
RBC: 3.51 MIL/uL — ABNORMAL LOW (ref 3.87–5.11)
RDW: 18.2 % — ABNORMAL HIGH (ref 11.5–15.5)
WBC: 10.2 10*3/uL (ref 4.0–10.5)
nRBC: 0.3 % — ABNORMAL HIGH (ref 0.0–0.2)

## 2018-05-26 LAB — GLUCOSE, CAPILLARY
Glucose-Capillary: 100 mg/dL — ABNORMAL HIGH (ref 70–99)
Glucose-Capillary: 123 mg/dL — ABNORMAL HIGH (ref 70–99)
Glucose-Capillary: 139 mg/dL — ABNORMAL HIGH (ref 70–99)
Glucose-Capillary: 149 mg/dL — ABNORMAL HIGH (ref 70–99)
Glucose-Capillary: 150 mg/dL — ABNORMAL HIGH (ref 70–99)
Glucose-Capillary: 151 mg/dL — ABNORMAL HIGH (ref 70–99)
Glucose-Capillary: 156 mg/dL — ABNORMAL HIGH (ref 70–99)

## 2018-05-26 LAB — LACTATE DEHYDROGENASE: LDH: 319 U/L — ABNORMAL HIGH (ref 98–192)

## 2018-05-26 LAB — PHOSPHORUS: Phosphorus: 2.5 mg/dL (ref 2.5–4.6)

## 2018-05-26 LAB — CK: Total CK: 15 U/L — ABNORMAL LOW (ref 38–234)

## 2018-05-26 LAB — FERRITIN: Ferritin: 850 ng/mL — ABNORMAL HIGH (ref 11–307)

## 2018-05-26 LAB — SEDIMENTATION RATE: Sed Rate: 75 mm/hr — ABNORMAL HIGH (ref 0–22)

## 2018-05-26 LAB — MAGNESIUM: Magnesium: 2.2 mg/dL (ref 1.7–2.4)

## 2018-05-26 LAB — C-REACTIVE PROTEIN: CRP: 23.9 mg/dL — ABNORMAL HIGH (ref <1.0)

## 2018-05-26 MED ORDER — ZINC SULFATE 220 (50 ZN) MG PO CAPS
220.0000 mg | ORAL_CAPSULE | Freq: Every day | ORAL | Status: DC
  Administered 2018-05-27: 220 mg via ORAL
  Filled 2018-05-26: qty 1

## 2018-05-26 MED ORDER — VITAL HIGH PROTEIN PO LIQD
1000.0000 mL | ORAL | Status: DC
  Administered 2018-05-26 – 2018-05-28 (×4): 1000 mL

## 2018-05-26 MED ORDER — DEXTROSE 5 % IV SOLN
INTRAVENOUS | Status: DC
  Administered 2018-05-26: 11:00:00 via INTRAVENOUS

## 2018-05-26 MED ORDER — SENNOSIDES 8.8 MG/5ML PO SYRP: 10.0000 mL | ORAL_SOLUTION | Freq: Every day | ORAL | Status: DC

## 2018-05-26 MED ORDER — INSULIN ASPART 100 UNIT/ML ~~LOC~~ SOLN
0.0000 [IU] | SUBCUTANEOUS | Status: DC
  Administered 2018-05-26: 12:00:00 2 [IU] via SUBCUTANEOUS
  Administered 2018-05-26: 3 [IU] via SUBCUTANEOUS
  Administered 2018-05-26 – 2018-05-29 (×11): 2 [IU] via SUBCUTANEOUS
  Administered 2018-05-30: 3 [IU] via SUBCUTANEOUS
  Administered 2018-05-30: 2 [IU] via SUBCUTANEOUS
  Administered 2018-05-31: 3 [IU] via SUBCUTANEOUS
  Administered 2018-05-31 – 2018-06-01 (×2): 2 [IU] via SUBCUTANEOUS
  Administered 2018-06-01: 3 [IU] via SUBCUTANEOUS
  Administered 2018-06-01 – 2018-06-02 (×7): 2 [IU] via SUBCUTANEOUS
  Administered 2018-06-02: 3 [IU] via SUBCUTANEOUS
  Administered 2018-06-03 – 2018-06-05 (×5): 2 [IU] via SUBCUTANEOUS
  Administered 2018-06-05: 17:00:00 3 [IU] via SUBCUTANEOUS
  Administered 2018-06-05 – 2018-06-15 (×24): 2 [IU] via SUBCUTANEOUS

## 2018-05-26 MED ORDER — AZITHROMYCIN 500 MG PO TABS
500.0000 mg | ORAL_TABLET | Freq: Every day | ORAL | Status: AC
  Administered 2018-05-26 – 2018-05-27 (×2): 500 mg
  Filled 2018-05-26 (×2): qty 1

## 2018-05-26 MED ORDER — MIDAZOLAM HCL 2 MG/2ML IJ SOLN
1.0000 mg | INTRAMUSCULAR | Status: DC | PRN
  Administered 2018-05-26 – 2018-05-27 (×6): 2 mg via INTRAVENOUS
  Administered 2018-05-28: 1 mg via INTRAVENOUS
  Administered 2018-05-28 – 2018-05-29 (×3): 2 mg via INTRAVENOUS
  Administered 2018-05-29: 1 mg via INTRAVENOUS
  Administered 2018-05-29 (×3): 2 mg via INTRAVENOUS
  Administered 2018-05-29 (×2): 1 mg via INTRAVENOUS
  Administered 2018-05-29 – 2018-05-30 (×9): 2 mg via INTRAVENOUS
  Filled 2018-05-26 (×24): qty 2

## 2018-05-26 MED ORDER — DOCUSATE SODIUM 50 MG/5ML PO LIQD
50.0000 mg | Freq: Two times a day (BID) | ORAL | Status: DC
  Administered 2018-05-26: 50 mg via ORAL
  Filled 2018-05-26: qty 10

## 2018-05-26 MED ORDER — PRO-STAT SUGAR FREE PO LIQD
30.0000 mL | Freq: Two times a day (BID) | ORAL | Status: DC
  Administered 2018-05-26 – 2018-05-28 (×5): 30 mL
  Filled 2018-05-26 (×5): qty 30

## 2018-05-26 MED ORDER — MORPHINE SULFATE (PF) 2 MG/ML IV SOLN
2.0000 mg | INTRAVENOUS | Status: DC | PRN
  Administered 2018-05-26 – 2018-05-29 (×5): 2 mg via INTRAVENOUS
  Filled 2018-05-26 (×5): qty 1

## 2018-05-26 MED ORDER — CLOPIDOGREL BISULFATE 75 MG PO TABS
75.0000 mg | ORAL_TABLET | Freq: Every day | ORAL | Status: DC
  Administered 2018-05-27: 75 mg via ORAL
  Filled 2018-05-26: qty 1

## 2018-05-26 MED ORDER — AZITHROMYCIN 500 MG PO TABS: 500.0000 mg | ORAL_TABLET | Freq: Every day | ORAL | Status: DC

## 2018-05-26 NOTE — Progress Notes (Signed)
Pt had non-sustained HR of 150s @ 1620 on vent. CCM notified, will continue to monitor.

## 2018-05-26 NOTE — TOC Initial Note (Addendum)
Transition of Care [facility]) - Initial/Assessment Note    Patient Details  Name: Shannon Carroll MRN: [id number] Date of Birth: 1950-03-19  [facility] CM/SW Contact:    Maryclare Labrador, RN Phone Number: 05/26/2018, 8:39 AM  Clinical Narrative:  Pt recently discharged from [facility] to [facility] on 3/31.  Pt readmitted on 4/3.  Pt is positive for Covid. CM informed CSW AD - Leadership will determine how/when facilities are notified of pt positive result. Pts next of kin is her son - per notes son has been kept up to date regarding pts condition. Pt currently vented requiring both sedation and pressors. CM will continue to follow for discharge needs  Expected Discharge Plan: Skilled Nursing Facility(From [facility], prior to that from [facility]) Barriers to Discharge: Barriers Unresolved (comment)   Patient Goals and CMS Choice        Expected Discharge Plan and Services Expected Discharge Plan: Skilled Nursing Facility(From [place] [facility], prior to that from [facility])       Living arrangements for the past 2 months: Skilled Nursing Facility(In [facility] from 3/31 through this admit, prior to that she lived with her husband in an ALF)                          Prior Living Arrangements/Services Living arrangements for the past 2 months: Skilled Nursing Facility(In [facility] from 3/31 through this admit, prior to that she lived with her husband in an ALF)                     Activities of Daily Living      Permission Sought/Granted                  Emotional Assessment           Psych Involvement: No (comment)  Admission diagnosis:  SOB Patient Active Problem List   Diagnosis Date Noted  . Multifocal pneumonia 06/13/2018  . Acute respiratory failure ([place]) 05/31/2018  . Elevated troponin 05/28/2018  . SVT (supraventricular tachycardia) ([place]) 06/05/2018  . Acute respiratory failure with hypoxia ([place]) 05/29/2018  . Dementia, vascular ([place]) 06/16/2018  . Abnormal  LFTs   . Acute on chronic congestive heart failure ([place])   . SOB (shortness of breath)   . Hyperbilirubinemia   . Acute on chronic diastolic CHF (congestive heart failure) ([place]) 05/06/2018  . CKD (chronic kidney disease), stage III ([place]) 05/06/2018  . Normocytic anemia 05/06/2018  . Hypokalemia 05/06/2018  . Acute on chronic diastolic (congestive) heart failure ([place]) 05/06/2018  . Hyperlipidemia   . Depression   . Hypertension   . Stroke ([place])   . Shortness of breath 02/04/2018  . Mitral valve stenosis 01/30/2018  . Aortic valve stenosis 01/30/2018  . Pre-operative laboratory examination 01/30/2018  . Sleep apnea 01/30/2018  . Snoring 01/30/2018   PCP:  Harlan Stains, MD Pharmacy:   [id number] - [place], [address] [place] [address] Phone: [phone] Fax: [phone]     Social Determinants of Health (SDOH) Interventions    Readmission Risk Interventions No flowsheet data found.

## 2018-05-26 NOTE — Progress Notes (Signed)
NAME:  Shannon Carroll, MRN:  [id number], DOB:  [birth year], LOS: 4 ADMISSION DATE:  06/14/2018, CONSULTATION DATE:  05/23/2018 REFERRING MD:  Glenna Durand, CHIEF COMPLAINT:  SVT hpoxemia, COVID 68 POS   Brief History   68 yr old female with PMHx HFpEF, COPD, w/ bilateral infiltrates COVID positive on supplemental oxygen 8L with increased work of breathing, fatigue, and  Diaphoretic. PCCM consulted for acute respiratory distress.  Significant Hospital Events   Respiratory distress: 05/23/2018  Consults:    Procedures:  Endotracheal intubation- 05/23/2018  Significant Diagnostic Tests:  Bilateral alveolar infiltrates on CXR  Micro Data:  Blood cx 06/12/2018- negative to date Novel Coronavirus Positive result called 4/4. Tracheal aspirate 4/7>>>   Antimicrobials:  Vancomycin- 05/23/2018 Cefepime- 05/23/2018 Hydroxychloroquine- upon transfer to ICU -COVID Axithromycin - upon transfer to ICU- COVID  Interim history/subjective:  Discontinued fent gtt this morning, removed fent patch PSV/CPAP 5/5 tolerating well   Objective   Blood pressure 96/73, pulse 93, temperature 99.2 F (37.3 C), temperature source Oral, resp. rate 13, height 5\' 8"  (1.727 m), weight 122.4 kg, SpO2 99 %.    Vent Mode: PRVC FiO2 (%):  [35 %-50 %] 40 % Set Rate:  [16 bmp] 16 bmp Vt Set:  [380 mL] 380 mL PEEP:  [8 cmH20] 8 cmH20 Plateau Pressure:  [16 cmH20-18 cmH20] 16 cmH20   Intake/Output Summary (Last 24 hours) at 05/26/2018 0931 Last data filed at 05/26/2018 0900 Gross per 24 hour  Intake 1775.83 ml  Output 1160 ml  Net 615.83 ml   Filed Weights   05/24/18 0306 05/25/18 0317 05/26/18 0319  Weight: 123.1 kg 123.5 kg 122.4 kg    Examination: General: elderly obese female, intubated, NAD  HENT: NCAT, ETT OGT secure, Pink mmm, patent nares, trachea midline  Lungs: Diminished bibasilar sounds. Symmetrical chest wall expansion. No wheezing. No accessory muscle recruitment  Cardiovascular: NSR to Sinus tach, s1s2 no  r/g/m. No JVD. 1+ radial pulses  Abdomen: Soft, round, ndnt, normoactive x 4  Extremities: Symmetrical bulk and tone, no pitting edema. No obvious joint deformity  Neuro: Drowsy, awakens to voice, follows simple commands. PERRL  Skin: clean, dry, warm, intact   Resolved Hospital Problem list   Sinus tach improved, Transaminits.   Assessment & Plan:   Acute Hypoxic Respiratory Failure-  ARDS  -COVID Positive.  R/o bacterial co-infection.  P - Full vent support (PRVC, f22, Vt 6cc/Kg, Fio2 35, PEEP 10) -Wean vent as able  - RASS goal 0 to -1, on fent patch, BID clonazepam and PRN fent/versed. Wean fent gtt as able  -Tolerating PSV/CPAP, will evaluate for extubation this afternoon -If unable to extubated, SBT/WUA in AM with hopeful extubation tomorrow  - continue Hydroxychloroquine: follow QTC - Conitnue Azithromycin, Cefepime. Vanc dc'd  - Continue Zinc PO - Airborne/contact precautions.  - Trend LDN, Ferritin, CRP -CXR in AM   Shock septic secondary to COVID-19 -  MAP goal > 65 -PRN NE  Titrated For goal   Diabetes mellitus -Follow glucose on Chemistry. If consecutive values over 180 will need to start SSI.   AKI on CKD: 2L neg for admission Hypokalemia, Hypernatremia - Follow daily BMP, repeat later today as well.  - Hold further diuresis.  - K supplementation - Phos supplementation - Mg supplementation.  -D5 for mild hypernatremia  Acute metabolic encephalopathy in the setting of respiratory failure. Acute delirium. - Continue fentanyl infusion for RASS -0 to -1.  - Add scheduled PO benzo to facilitate weaning when able.  Constipation P Adding senna, colace   Hx Depression P Effexor as ordered   Best practice:  Diet:tube feed Pain/Anxiety/Delirium protocol (if indicated): Continuous infusion VAP protocol (if indicated): NA DVT prophylaxis: SCD GI prophylaxis: Protonix Glucose control: sliding scale Mobility: bed bound Code Status: Partial code Family  Communication: Pending Disposition: Critically ill in ICU.   Labs   CBC: Recent Labs  Lab 05/23/2018 1400 06/04/2018 1937 05/23/18 0431  05/24/18 0341 05/24/18 1024 05/25/18 0237 05/25/18 0242 05/26/18 0732  WBC 13.7* 8.5 8.3  --  8.0  --   --  10.2 10.2  NEUTROABS 13.2*  --  7.7  --  7.5  --   --  9.3* 8.7*  HGB 9.9* 8.8* 9.3*   < > 8.8* 9.9* 11.2* 9.1* 8.6*  HCT 34.5* 30.0* 32.4*   < > 29.4* 29.0* 33.0* 31.6* 31.1*  MCV 86.3 85.0 86.4  --  86.7  --   --  86.6 88.6  PLT 266 208 204  --  195  --   --  231 254   < > = values in this interval not displayed.    Basic Metabolic Panel: Recent Labs  Lab 05/23/18 0431  05/24/18 0040  05/24/18 0341 05/24/18 0342 05/24/18 1024 05/24/18 1821 05/25/18 0237 05/25/18 0242 05/25/18 1700 05/26/18 0732  NA 142   < >  --    < > 141  --  142  --  142 143 145 146*  K 3.7   < >  --    < > 3.5  --  3.1*  --  2.7* 2.8* 3.7 3.8  CL 95*  --   --   --  95*  --   --   --   --  95* 101 100  CO2 32  --   --   --  35*  --   --   --   --  36* 37* 37*  GLUCOSE 97  --   --   --  92  --   --   --   --  152* 127* 139*  BUN 32*  --   --   --  32*  --   --   --   --  29* 27* 27*  CREATININE 1.27*  --   --   --  1.27*  --   --   --   --  1.11* 1.08* 1.13*  CALCIUM 8.2*  --   --   --  7.9*  --   --   --   --  7.8* 7.8* 8.2*  MG 1.9  --  1.8  --   --  2.1  --  1.9  --  1.8  --  2.2  PHOS  --   --  3.6  --  3.6  --   --  2.2*  --  2.1*  --  2.5   < > = values in this interval not displayed.   GFR: Estimated Creatinine Clearance: 66.6 mL/min (A) (by C-G formula based on SCr of 1.13 mg/dL (H)). Recent Labs  Lab 06/12/2018 1400 05/20/2018 1650 05/31/2018 1937 05/23/18 0431 05/24/18 0341 05/25/18 0242 05/26/18 0732  PROCALCITON  --   --  0.32  --   --   --   --   WBC 13.7*  --  8.5 8.3 8.0 10.2 10.2  LATICACIDVEN 2.5* 1.7  --   --   --   --   --  Liver Function Tests: Recent Labs  Lab 06/18/2018 1400 05/23/18 0431 05/24/18 0341 05/25/18 0242  05/26/18 0732  AST 83* 70* 63* 57* 49*  ALT 78* 65* 50* 42 35  ALKPHOS 110 94 84 89 81  BILITOT 2.4* 1.9* 2.2* 1.8* 1.4*  PROT 6.8 6.3* 5.9* 6.2* 6.7  ALBUMIN 2.7* 2.5* 2.2* 2.1* 2.2*   No results for input(s): LIPASE, AMYLASE in the last 168 hours. No results for input(s): AMMONIA in the last 168 hours.  ABG    Component Value Date/Time   PHART 7.522 (H) 05/25/2018 0237   PCO2ART 54.3 (H) 05/25/2018 0237   PO2ART 104.0 05/25/2018 0237   HCO3 44.5 (H) 05/25/2018 0237   TCO2 46 (H) 05/25/2018 0237   O2SAT 98.0 05/25/2018 0237     Coagulation Profile: No results for input(s): INR, PROTIME in the last 168 hours.  Cardiac Enzymes: Recent Labs  Lab 05/23/18 0431 05/24/18 0341 05/25/18 0242 05/26/18 0732  CKTOTAL 26* 20* 24* 15*    HbA1C: Hgb A1c MFr Bld  Date/Time Value Ref Range Status  03/02/2018 04:43 PM 5.2 4.8 - 5.6 % Final    Comment:             Prediabetes: 5.7 - 6.4          Diabetes: >6.4          Glycemic control for adults with diabetes: <7.0     CBG: Recent Labs  Lab 05/25/18 1558 05/25/18 2128 05/26/18 0001 05/26/18 0310 05/26/18 0808  GLUCAP 124* 150* 149* 100* 139*    Critical care time: 55 min      Eliseo Gum MSN, AGACNP-BC [place] [phone] If no answer, [phone] 05/26/2018, 9:32 AM

## 2018-05-26 NOTE — Progress Notes (Signed)
Tele visit with son provided by [facility]

## 2018-05-26 NOTE — Progress Notes (Signed)
Pt was getting very tired on weaning trial. Pt was able to lightly squeeze my hand on command, but unable to open eyes or lift head. Pt was placed back on previous ventilator settings. RT will continue to monitor.

## 2018-05-26 NOTE — Progress Notes (Signed)
Tele visit with son provided by [facility]

## 2018-05-26 NOTE — Progress Notes (Signed)
Tele visit with family provided by [facility]

## 2018-05-27 ENCOUNTER — Telehealth: Payer: Self-pay | Admitting: *Deleted

## 2018-05-27 ENCOUNTER — Inpatient Hospital Stay (HOSPITAL_COMMUNITY): Payer: Medicare Other

## 2018-05-27 DIAGNOSIS — J810 Acute pulmonary edema: Secondary | ICD-10-CM

## 2018-05-27 LAB — GLUCOSE, CAPILLARY
Glucose-Capillary: 103 mg/dL — ABNORMAL HIGH (ref 70–99)
Glucose-Capillary: 117 mg/dL — ABNORMAL HIGH (ref 70–99)
Glucose-Capillary: 126 mg/dL — ABNORMAL HIGH (ref 70–99)
Glucose-Capillary: 126 mg/dL — ABNORMAL HIGH (ref 70–99)
Glucose-Capillary: 128 mg/dL — ABNORMAL HIGH (ref 70–99)
Glucose-Capillary: 132 mg/dL — ABNORMAL HIGH (ref 70–99)

## 2018-05-27 LAB — CBC WITH DIFFERENTIAL/PLATELET
Abs Immature Granulocytes: 0 10*3/uL (ref 0.00–0.07)
Basophils Absolute: 0 10*3/uL (ref 0.0–0.1)
Basophils Relative: 0 %
Eosinophils Absolute: 0 10*3/uL (ref 0.0–0.5)
Eosinophils Relative: 0 %
HCT: 29.2 % — ABNORMAL LOW (ref 36.0–46.0)
Hemoglobin: 7.9 g/dL — ABNORMAL LOW (ref 12.0–15.0)
Lymphocytes Relative: 4 %
Lymphs Abs: 0.3 10*3/uL — ABNORMAL LOW (ref 0.7–4.0)
MCH: 24.3 pg — ABNORMAL LOW (ref 26.0–34.0)
MCHC: 27.1 g/dL — ABNORMAL LOW (ref 30.0–36.0)
MCV: 89.8 fL (ref 80.0–100.0)
Monocytes Absolute: 0.2 10*3/uL (ref 0.1–1.0)
Monocytes Relative: 3 %
Neutro Abs: 7.5 10*3/uL (ref 1.7–7.7)
Neutrophils Relative %: 93 %
Platelets: 212 10*3/uL (ref 150–400)
RBC: 3.25 MIL/uL — ABNORMAL LOW (ref 3.87–5.11)
RDW: 18.4 % — ABNORMAL HIGH (ref 11.5–15.5)
WBC: 8.1 10*3/uL (ref 4.0–10.5)
nRBC: 0 % (ref 0.0–0.2)
nRBC: 1 /100 WBC — ABNORMAL HIGH

## 2018-05-27 LAB — CK: Total CK: 17 U/L — ABNORMAL LOW (ref 38–234)

## 2018-05-27 LAB — COMPREHENSIVE METABOLIC PANEL
ALT: 29 U/L (ref 0–44)
AST: 43 U/L — ABNORMAL HIGH (ref 15–41)
Albumin: 2.1 g/dL — ABNORMAL LOW (ref 3.5–5.0)
Alkaline Phosphatase: 76 U/L (ref 38–126)
Anion gap: 9 (ref 5–15)
BUN: 34 mg/dL — ABNORMAL HIGH (ref 8–23)
CO2: 36 mmol/L — ABNORMAL HIGH (ref 22–32)
Calcium: 8.4 mg/dL — ABNORMAL LOW (ref 8.9–10.3)
Chloride: 99 mmol/L (ref 98–111)
Creatinine, Ser: 1.07 mg/dL — ABNORMAL HIGH (ref 0.44–1.00)
GFR calc Af Amer: >60 mL/min (ref >60)
GFR calc non Af Amer: 54 mL/min — ABNORMAL LOW (ref >60)
Glucose, Bld: 136 mg/dL — ABNORMAL HIGH (ref 70–99)
Potassium: 4.2 mmol/L (ref 3.5–5.1)
Sodium: 144 mmol/L (ref 135–145)
Total Bilirubin: 1.5 mg/dL — ABNORMAL HIGH (ref 0.3–1.2)
Total Protein: 6.6 g/dL (ref 6.5–8.1)

## 2018-05-27 LAB — CULTURE, BLOOD (ROUTINE X 2)
Culture: NO GROWTH
Culture: NO GROWTH
Special Requests: ADEQUATE
Special Requests: ADEQUATE

## 2018-05-27 LAB — C-REACTIVE PROTEIN: CRP: 20.1 mg/dL — ABNORMAL HIGH (ref <1.0)

## 2018-05-27 LAB — PROCALCITONIN: Procalcitonin: 0.12 ng/mL

## 2018-05-27 MED ORDER — SODIUM CHLORIDE 0.9 % IV SOLN
2.0000 g | Freq: Three times a day (TID) | INTRAVENOUS | Status: DC
  Filled 2018-05-27 (×2): qty 2

## 2018-05-27 MED ORDER — CLOPIDOGREL BISULFATE 75 MG PO TABS
75.0000 mg | ORAL_TABLET | Freq: Every day | ORAL | Status: DC
  Administered 2018-05-28 – 2018-06-15 (×19): 75 mg
  Filled 2018-05-27 (×19): qty 1

## 2018-05-27 MED ORDER — SODIUM CHLORIDE 0.9 % IV SOLN
2.0000 g | Freq: Once | INTRAVENOUS | Status: AC
  Administered 2018-05-27: 2 g via INTRAVENOUS
  Filled 2018-05-27: qty 2

## 2018-05-27 MED ORDER — BUDESONIDE 0.25 MG/2ML IN SUSP
0.2500 mg | Freq: Two times a day (BID) | RESPIRATORY_TRACT | Status: DC
  Administered 2018-05-27 – 2018-06-08 (×26): 0.25 mg via RESPIRATORY_TRACT
  Filled 2018-05-27 (×26): qty 2

## 2018-05-27 MED ORDER — VENLAFAXINE HCL 37.5 MG PO TABS
37.5000 mg | ORAL_TABLET | Freq: Every day | ORAL | Status: DC
  Administered 2018-05-27 – 2018-06-14 (×19): 37.5 mg
  Filled 2018-05-27 (×20): qty 1

## 2018-05-27 MED ORDER — NOREPINEPHRINE 4 MG/250ML-% IV SOLN
INTRAVENOUS | Status: AC
  Filled 2018-05-27: qty 250

## 2018-05-27 MED ORDER — FREE WATER: 100.0000 mL | Freq: Three times a day (TID) | Status: DC

## 2018-05-27 MED ORDER — NOREPINEPHRINE 4 MG/250ML-% IV SOLN
0.0000 ug/min | INTRAVENOUS | Status: DC
  Administered 2018-05-27: 2 ug/min via INTRAVENOUS

## 2018-05-27 MED ORDER — VENLAFAXINE HCL 75 MG PO TABS
75.0000 mg | ORAL_TABLET | Freq: Every morning | ORAL | Status: DC
  Administered 2018-05-28 – 2018-06-15 (×19): 75 mg
  Filled 2018-05-27 (×20): qty 1

## 2018-05-27 MED ORDER — FUROSEMIDE 10 MG/ML IJ SOLN
INTRAMUSCULAR | Status: AC
  Filled 2018-05-27: qty 4

## 2018-05-27 MED ORDER — LABETALOL HCL 5 MG/ML IV SOLN
5.0000 mg | Freq: Once | INTRAVENOUS | Status: AC
  Administered 2018-05-27: 5 mg via INTRAVENOUS
  Filled 2018-05-27: qty 4

## 2018-05-27 MED ORDER — FREE WATER
200.0000 mL | Freq: Three times a day (TID) | Status: DC
  Administered 2018-05-27 – 2018-06-05 (×26): 200 mL

## 2018-05-27 MED ORDER — FUROSEMIDE 10 MG/ML IJ SOLN
40.0000 mg | Freq: Once | INTRAMUSCULAR | Status: AC
  Administered 2018-05-27: 40 mg via INTRAVENOUS

## 2018-05-27 MED ORDER — ZINC SULFATE 220 (50 ZN) MG PO CAPS
220.0000 mg | ORAL_CAPSULE | Freq: Every day | ORAL | Status: AC
  Administered 2018-05-28: 10:00:00 220 mg
  Filled 2018-05-27: qty 1

## 2018-05-27 NOTE — Telephone Encounter (Signed)
   Primary Cardiologist:  Minus Breeding, MD   Patient contacted.  History reviewed.  No symptoms to suggest any unstable cardiac conditions.  Based on discussion, with current pandemic situation, we will be postponing this appointment for Shannon Carroll with a plan for f/u in Greater than 12 weeks  or sooner if feasible/necessary.  If symptoms change, she has been instructed to contact our office.   Routing to C19 CANCEL pool for tracking (P CV DIV CV19 CANCEL - reason for visit "other.") and assigning priority (1 = 4-6 wks, 2 = 6-12 wks, 3 = >12 wks).   Barbaraann Barthel  05/27/2018 11:41 AM         .

## 2018-05-27 NOTE — Progress Notes (Signed)
Pharmacy Antibiotic Note  Shannon Carroll is a 68 y.o. female admitted on 06/06/2018 with pneumonia.  Pharmacy has been consulted for cefepime dosing.  Patient's Scr has improved since admission to 1.07. Patient remains afebrile, WBC 8.1. Respiratory aspirate shows abundant gram positive cocci and gram negative rods. She was intubated 05/23/18.  Plan: Start cefepime 2g every 8 hours F/u cultures, renal function, length of therapy, and clinical course  Height: 5\' 8"  (172.7 cm) Weight: 271 lb 2.7 oz (123 kg) IBW/kg (Calculated) : 63.9  Temp (24hrs), Avg:98.8 F (37.1 C), Min:98 F (36.7 C), Max:99.5 F (37.5 C)  Recent Labs  Lab 05/23/2018 1400 05/29/2018 1650  05/23/18 0431 05/24/18 0341 05/25/18 0242 05/25/18 1700 05/26/18 0732 05/27/18 0636  WBC 13.7*  --    < > 8.3 8.0 10.2  --  10.2 8.1  CREATININE 1.25*  --    < > 1.27* 1.27* 1.11* 1.08* 1.13* 1.07*  LATICACIDVEN 2.5* 1.7  --   --   --   --   --   --   --   VANCORANDOM  --   --   --   --  18  --   --   --   --    < > = values in this interval not displayed.    Estimated Creatinine Clearance: 70.5 mL/min (A) (by C-G formula based on SCr of 1.07 mg/dL (H)).    Allergies  Allergen Reactions  . Lisinopril Cough    Antimicrobials this admission: 4/4 Hydroxy>> (4/9)  4/4 Azithro >> (4/8) 4/3 Vanc >>4/6 4/3 Cefepime >>4/6, restarted 4/8 4/3 Flagyl x 1    Microbiology results: 4/5 mrsa pcr: neg 4/3 blood cx: ngtd 4/3 Covid: positive  4/7 Resp asp: G+ cocci and G-rods   Thank you for allowing pharmacy to be a part of this patient's care.  Azzie Roup D PGY1 Pharmacy Resident  Phone [phone] Please use [facility] for clinical pharmacists numbers  05/27/2018      1:20 PM

## 2018-05-27 NOTE — Progress Notes (Addendum)
NAME:  Shannon Carroll, MRN:  [id number], DOB:  13-Jun-1950, LOS: 5 ADMISSION DATE:  06/02/2018, CONSULTATION DATE:  05/23/2018 REFERRING MD:  Glenna Durand, CHIEF COMPLAINT:  SVT hpoxemia, COVID 3 POS   Brief History   68 yr old female with PMHx HFpEF, COPD, w/ bilateral infiltrates COVID positive on supplemental oxygen 8L with increased work of breathing, fatigue, and  Diaphoretic. PCCM consulted for acute respiratory distress.  Significant Hospital Events   Respiratory distress: 05/23/2018  Consults:    Procedures:  Endotracheal intubation- 05/23/2018  Significant Diagnostic Tests:  Bilateral alveolar infiltrates on CXR -CXR 4/8 with worsening LLL and RML opacity   Micro Data:  Blood cx 05/25/2018- NGTD Novel Coronavirus Positive result called 4/4. Tracheal aspirate 4/7>>> moderate WBC (abundant GPC and GNR)  MRSA 4/5> neg   Antimicrobials:  Vancomycin- 05/23/2018 Cefepime- 05/23/2018-4/6, 4/8>>  Hydroxychloroquine- upon transfer to ICU -COVID (to end 4/9) Axithromycin - upon transfer to ICU- COVID (to end 4/8)  Interim history/subjective:  Trial of PSV/CPAP this morning with some associated Sinus tach  No acute events overnight   Remains off NE  Objective   Blood pressure (!) 109/48, pulse (!) 106, temperature 98.7 F (37.1 C), temperature source Axillary, resp. rate 18, height 5\' 8"  (1.727 m), weight 123 kg, SpO2 100 %.    Vent Mode: PRVC FiO2 (%):  [40 %] 40 % Set Rate:  [16 bmp] 16 bmp Vt Set:  [380 mL] 380 mL PEEP:  [5 cmH20] 5 cmH20 Pressure Support:  [5 cmH20] 5 cmH20 Plateau Pressure:  [14 cmH20-18 cmH20] 18 cmH20   Intake/Output Summary (Last 24 hours) at 05/27/2018 0959 Last data filed at 05/27/2018 0600 Gross per 24 hour  Intake 1873.91 ml  Output 905 ml  Net 968.91 ml   Filed Weights   05/25/18 0317 05/26/18 0319 05/27/18 0451  Weight: 123.5 kg 122.4 kg 123 kg    Examination:  General: Elderly obese female, intubated, NAD  HENT: NCAT ETT/OGT secure, trachea  midline, anicteric sclera, pink mmm  Lungs: Diminished lung sounds. Symmetrical chest expansion. No accessory muscle recruitment during SBT.  Cardiovascular: RRR s1s2 no r/g/m, no JVD, 2+ radial pulses Abdomen: soft, round, ndnt, hyperactive x4  Extremities: Symmetrical bulk and tone, no pitting edema, no obvious joint deformity  Neuro: Drowsy, intermittently follows commands. PERRL Skin: c/d/w without rash. Some ecchymosis of RUE near former IV site   Resolved Hospital Problem list   Transaminits.   Assessment & Plan:   Acute Hypoxic Respiratory Failure- -ARDS  -COVID Positive.  -R/o bacterial co-infection.  -CXR 4/8 with worsening LLL and RML opacity  P - Full vent support (PRVC, f22, Vt 6cc/Kg, Fio2 35, PEEP 10), wean as able  - Daily WUA/SBT, evaluate daily for extubation. mited by mental status - RASS goal 0 to -1, PRN morphine PRN versed  - continue Hydroxychloroquine: follow QTC, to end 4/9  - Azithromycin  And Cefepime for ?bacterial suprainfection. Follow up cultures.  - Zinc to end 4/9 - Airborne/contact precautions.  - Trend LDN, Ferritin, CRP - AM CXR  - Budesimide   Shock septic secondary to COVID-19 -  MAP goal > 65 -PRN NE  Titrated For goal  -Remains on Azithro -Follow up on tracheal aspirate   Diabetes mellitus -Monitor glucose, no SSI at this time If consecutive values over 180 will need to start SSI.   AKI on CKD: 2L neg for admission Hypokalemia Hypernatremia resolved  - Follow daily BMP, repeat later today as well.  -  Hold further diuresis.  - K supplementation - Phos supplementation - Mg supplementation.  - Transitioning D5 to FWF   Acute metabolic encephalopathy in the setting of respiratory failure. Acute delirium. - Continue fentanyl infusion for RASS -0 to -1.  - Add scheduled PO benzo to facilitate weaning when able.   Constipation P Adding senna, colace   Hx Depression P Effexor as ordered   Sinus Tachycardia Associated with  SBTs P PRN labetalol available Continue tele   Best practice:  Diet:tube feed Pain/Anxiety/Delirium protocol (if indicated): Continuous infusion VAP protocol (if indicated): NA DVT prophylaxis: SCD GI prophylaxis: Protonix Glucose control: sliding scale Mobility: bed bound Code Status: Partial code Family Communication: Pending Disposition: ICU   Labs   CBC: Recent Labs  Lab 05/23/18 0431  05/24/18 0341 05/24/18 1024 05/25/18 0237 05/25/18 0242 05/26/18 0732 05/27/18 0636  WBC 8.3  --  8.0  --   --  10.2 10.2 8.1  NEUTROABS 7.7  --  7.5  --   --  9.3* 8.7* 7.5  HGB 9.3*   < > 8.8* 9.9* 11.2* 9.1* 8.6* 7.9*  HCT 32.4*   < > 29.4* 29.0* 33.0* 31.6* 31.1* 29.2*  MCV 86.4  --  86.7  --   --  86.6 88.6 89.8  PLT 204  --  195  --   --  231 254 212   < > = values in this interval not displayed.    Basic Metabolic Panel: Recent Labs  Lab 05/24/18 0040  05/24/18 0341 05/24/18 0342  05/24/18 1821 05/25/18 0237 05/25/18 0242 05/25/18 1700 05/26/18 0732 05/27/18 0636  NA  --    < > 141  --    < >  --  142 143 145 146* 144  K  --    < > 3.5  --    < >  --  2.7* 2.8* 3.7 3.8 4.2  CL  --   --  95*  --   --   --   --  95* 101 100 99  CO2  --   --  35*  --   --   --   --  36* 37* 37* 36*  GLUCOSE  --   --  92  --   --   --   --  152* 127* 139* 136*  BUN  --   --  32*  --   --   --   --  29* 27* 27* 34*  CREATININE  --   --  1.27*  --   --   --   --  1.11* 1.08* 1.13* 1.07*  CALCIUM  --   --  7.9*  --   --   --   --  7.8* 7.8* 8.2* 8.4*  MG 1.8  --   --  2.1  --  1.9  --  1.8  --  2.2  --   PHOS 3.6  --  3.6  --   --  2.2*  --  2.1*  --  2.5  --    < > = values in this interval not displayed.   GFR: Estimated Creatinine Clearance: 70.5 mL/min (A) (by C-G formula based on SCr of 1.07 mg/dL (H)). Recent Labs  Lab 06/08/2018 1400 06/13/2018 1650 06/14/2018 1937  05/24/18 0341 05/25/18 0242 05/26/18 0732 05/27/18 0636  PROCALCITON  --   --  0.32  --   --   --   --  0.12  WBC 13.7*  --  8.5   < > 8.0 10.2 10.2 8.1  LATICACIDVEN 2.5* 1.7  --   --   --   --   --   --    < > = values in this interval not displayed.    Liver Function Tests: Recent Labs  Lab 05/23/18 0431 05/24/18 0341 05/25/18 0242 05/26/18 0732 05/27/18 0636  AST 70* 63* 57* 49* 43*  ALT 65* 50* 42 35 29  ALKPHOS 94 84 89 81 76  BILITOT 1.9* 2.2* 1.8* 1.4* 1.5*  PROT 6.3* 5.9* 6.2* 6.7 6.6  ALBUMIN 2.5* 2.2* 2.1* 2.2* 2.1*   No results for input(s): LIPASE, AMYLASE in the last 168 hours. No results for input(s): AMMONIA in the last 168 hours.  ABG    Component Value Date/Time   PHART 7.522 (H) 05/25/2018 0237   PCO2ART 54.3 (H) 05/25/2018 0237   PO2ART 104.0 05/25/2018 0237   HCO3 44.5 (H) 05/25/2018 0237   TCO2 46 (H) 05/25/2018 0237   O2SAT 98.0 05/25/2018 0237     Coagulation Profile: No results for input(s): INR, PROTIME in the last 168 hours.  Cardiac Enzymes: Recent Labs  Lab 05/23/18 0431 05/24/18 0341 05/25/18 0242 05/26/18 0732 05/27/18 0636  CKTOTAL 26* 20* 24* 15* 17*    HbA1C: Hgb A1c MFr Bld  Date/Time Value Ref Range Status  03/02/2018 04:43 PM 5.2 4.8 - 5.6 % Final    Comment:             Prediabetes: 5.7 - 6.4          Diabetes: >6.4          Glycemic control for adults with diabetes: <7.0     CBG: Recent Labs  Lab 05/26/18 1542 05/26/18 2009 05/26/18 2333 05/27/18 0435 05/27/18 0825  GLUCAP 156* 151* 123* 126* 128*    Critical care time: 45 minutes     Eliseo Gum MSN, AGACNP-BC [place] [phone] If no answer, [phone] 05/27/2018, 9:59 AM

## 2018-05-27 NOTE — Progress Notes (Signed)
[place] Progress Note Patient Name: Shannon Carroll DOB: 04/26/1950 MRN: [id number]   Date of Service  05/27/2018  HPI/Events of Note  Non sustained VT. Labs reviewed, no correctibles  eICU Interventions  Will include magnesium to lab draw     Intervention Category Major Interventions: Arrhythmia - evaluation and management  Judd Lien 05/27/2018, 9:26 PM

## 2018-05-28 ENCOUNTER — Telehealth: Payer: Self-pay

## 2018-05-28 ENCOUNTER — Inpatient Hospital Stay (HOSPITAL_COMMUNITY): Payer: Medicare Other

## 2018-05-28 LAB — BASIC METABOLIC PANEL
Anion gap: 10 (ref 5–15)
BUN: 32 mg/dL — ABNORMAL HIGH (ref 8–23)
CO2: 36 mmol/L — ABNORMAL HIGH (ref 22–32)
Calcium: 8.4 mg/dL — ABNORMAL LOW (ref 8.9–10.3)
Chloride: 99 mmol/L (ref 98–111)
Creatinine, Ser: 1.01 mg/dL — ABNORMAL HIGH (ref 0.44–1.00)
GFR calc Af Amer: >60 mL/min (ref >60)
GFR calc non Af Amer: 58 mL/min — ABNORMAL LOW (ref >60)
Glucose, Bld: 121 mg/dL — ABNORMAL HIGH (ref 70–99)
Potassium: 3.8 mmol/L (ref 3.5–5.1)
Sodium: 145 mmol/L (ref 135–145)

## 2018-05-28 LAB — CBC
HCT: 28.6 % — ABNORMAL LOW (ref 36.0–46.0)
Hemoglobin: 8.2 g/dL — ABNORMAL LOW (ref 12.0–15.0)
MCH: 25.5 pg — ABNORMAL LOW (ref 26.0–34.0)
MCHC: 28.7 g/dL — ABNORMAL LOW (ref 30.0–36.0)
MCV: 88.8 fL (ref 80.0–100.0)
Platelets: 247 10*3/uL (ref 150–400)
RBC: 3.22 MIL/uL — ABNORMAL LOW (ref 3.87–5.11)
RDW: 18.5 % — ABNORMAL HIGH (ref 11.5–15.5)
WBC: 7.9 10*3/uL (ref 4.0–10.5)
nRBC: 0.3 % — ABNORMAL HIGH (ref 0.0–0.2)

## 2018-05-28 LAB — GLUCOSE, CAPILLARY
Glucose-Capillary: 113 mg/dL — ABNORMAL HIGH (ref 70–99)
Glucose-Capillary: 113 mg/dL — ABNORMAL HIGH (ref 70–99)
Glucose-Capillary: 130 mg/dL — ABNORMAL HIGH (ref 70–99)
Glucose-Capillary: 100 mg/dL — ABNORMAL HIGH (ref 70–99)
Glucose-Capillary: 103 mg/dL — ABNORMAL HIGH (ref 70–99)

## 2018-05-28 LAB — CULTURE, RESPIRATORY W GRAM STAIN: Culture: NORMAL

## 2018-05-28 LAB — CULTURE, RESPIRATORY

## 2018-05-28 LAB — MAGNESIUM: Magnesium: 2.2 mg/dL (ref 1.7–2.4)

## 2018-05-28 MED ORDER — FUROSEMIDE 10 MG/ML IJ SOLN
40.0000 mg | Freq: Once | INTRAMUSCULAR | Status: AC
  Administered 2018-05-28: 40 mg via INTRAVENOUS
  Filled 2018-05-28: qty 4

## 2018-05-28 MED ORDER — PANTOPRAZOLE SODIUM 40 MG PO PACK
40.0000 mg | PACK | Freq: Every day | ORAL | Status: DC
  Administered 2018-05-28 – 2018-06-15 (×19): 40 mg
  Filled 2018-05-28 (×20): qty 20

## 2018-05-28 MED ORDER — VITAL HIGH PROTEIN PO LIQD
1000.0000 mL | ORAL | Status: AC
  Administered 2018-05-28 – 2018-06-01 (×6): 1000 mL
  Filled 2018-05-28 (×2): qty 1000

## 2018-05-28 NOTE — Progress Notes (Signed)
Assisted tele visit to patient with patient's son Mitzi Hansen.   Adam Phenix, RN

## 2018-05-28 NOTE — Progress Notes (Signed)
Assisted tele visit to patient with son.  Eamon Tantillo R, RN  

## 2018-05-28 NOTE — Telephone Encounter (Signed)
I called pts son [name] on dpr. I stated pt has appt with Dr.SEthi on 06/08/2018 for 3 month follow up. I also stated she is currently in the hospital. PT son stated she tested positive for COVID 19. I stated for the safety of pt and our office pts appt will be cancel. [name] agreed and verbalized understanding.

## 2018-05-28 NOTE — Progress Notes (Signed)
NAME:  Shannon Carroll, MRN:  [id number], DOB:  1950-12-07, LOS: 6 ADMISSION DATE:  06/04/2018, CONSULTATION DATE:  05/23/2018 REFERRING MD:  Glenna Durand, CHIEF COMPLAINT:  SVT hpoxemia, COVID 49 POS   Brief History   68 yr old female with PMHx HFpEF, COPD, w/ bilateral infiltrates COVID positive on supplemental oxygen 8L with increased work of breathing, fatigue, and  Diaphoretic. PCCM consulted for acute respiratory distress.  Significant Hospital Events   Respiratory distress: 05/23/2018  Consults:    Procedures:  Endotracheal intubation- 05/23/2018  Significant Diagnostic Tests:  Bilateral alveolar infiltrates on CXR -CXR 4/8 with worsening LLL and RML opacity   Micro Data:  Blood cx 05/28/2018- NGTD Novel Coronavirus Positive result called 4/4. Tracheal aspirate 4/7>>> moderate WBC (abundant GPC and GNR)  MRSA 4/5> neg   Antimicrobials:  Vancomycin- 05/23/2018 Cefepime- 05/23/2018-4/6, 4/8>>  Hydroxychloroquine- upon transfer to ICU -COVID (to end 4/9) Axithromycin - upon transfer to ICU- COVID (to end 4/8)  Interim history/subjective:  Off levo. Responded well to lasix Weaning on PSV 5/5 More awake.  Objective   Blood pressure (!) 102/56, pulse 89, temperature 98.8 F (37.1 C), temperature source Axillary, resp. rate (!) 28, height 5\' 8"  (1.727 m), weight 122.8 kg, SpO2 100 %.    Vent Mode: CPAP;PSV FiO2 (%):  [40 %] 40 % Set Rate:  [16 bmp] 16 bmp Vt Set:  [380 mL] 380 mL PEEP:  [5 cmH20] 5 cmH20 Pressure Support:  [5 cmH20] 5 cmH20 Plateau Pressure:  [15 cmH20-16 cmH20] 16 cmH20   Intake/Output Summary (Last 24 hours) at 05/28/2018 1139 Last data filed at 05/28/2018 1000 Gross per 24 hour  Intake 1239.35 ml  Output 2385 ml  Net -1145.65 ml   Filed Weights   05/26/18 0319 05/27/18 0451 05/28/18 0332  Weight: 122.4 kg 123 kg 122.8 kg    Examination:  Gen:      No acute distress HEENT:  EOMI, sclera anicteric, ETT Neck:     No masses; no thyromegaly Lungs:    Clear to  auscultation bilaterally; normal respiratory effort CV:         Regular rate and rhythm; no murmurs Abd:      + bowel sounds; soft, non-tender; no palpable masses, no distension Ext:    No edema; adequate peripheral perfusion Skin:      Warm and dry; no rash Neuro: Sedated, arousable  Resolved Hospital Problem list   Transaminits.   Assessment & Plan:   Acute Hypoxic Respiratory Failure- -ARDS  -COVID Positive.  -R/o bacterial co-infection.  P  Weaning on PSV Likely can be extubated today if mental status improves. Holding sedation Finished plaquenil, Zithromax On cefepime for HCAP  Shock septic secondary to COVID-19 Off pressors  Diabetes mellitus -Monitor glucose, no SSI at this time If consecutive values over 180 will need to start SSI.   AKI on CKD: 2L neg for admission Follow urine output and cr  Constipation P Adding senna, colace   Hx Depression P Effexor as ordered   Sinus Tachycardia Associated with SBTs P PRN labetalol available Continue tele   Best practice:  Diet:tube feed Pain/Anxiety/Delirium protocol (if indicated): Continuous infusion VAP protocol (if indicated): NA DVT prophylaxis: SCD, Lovenox GI prophylaxis: Protonix Glucose control: sliding scale Mobility: bed bound Code Status: Partial code Family Communication: Pending Disposition: ICU   Labs   CBC: Recent Labs  Lab 05/23/18 0431  05/24/18 0341  05/25/18 0237 05/25/18 0242 05/26/18 0732 05/27/18 0636 05/28/18 0338  WBC 8.3  --  8.0  --   --  10.2 10.2 8.1 7.9  NEUTROABS 7.7  --  7.5  --   --  9.3* 8.7* 7.5  --   HGB 9.3*   < > 8.8*   < > 11.2* 9.1* 8.6* 7.9* 8.2*  HCT 32.4*   < > 29.4*   < > 33.0* 31.6* 31.1* 29.2* 28.6*  MCV 86.4  --  86.7  --   --  86.6 88.6 89.8 88.8  PLT 204  --  195  --   --  231 254 212 247   < > = values in this interval not displayed.    Basic Metabolic Panel: Recent Labs  Lab 05/24/18 0040  05/24/18 0341 05/24/18 0342  05/24/18 1821   05/25/18 0242 05/25/18 1700 05/26/18 0732 05/27/18 0636 05/28/18 0338  NA  --    < > 141  --    < >  --    < > 143 145 146* 144 145  K  --    < > 3.5  --    < >  --    < > 2.8* 3.7 3.8 4.2 3.8  CL  --   --  95*  --   --   --   --  95* 101 100 99 99  CO2  --   --  35*  --   --   --   --  36* 37* 37* 36* 36*  GLUCOSE  --   --  92  --   --   --   --  152* 127* 139* 136* 121*  BUN  --   --  32*  --   --   --   --  29* 27* 27* 34* 32*  CREATININE  --   --  1.27*  --   --   --   --  1.11* 1.08* 1.13* 1.07* 1.01*  CALCIUM  --   --  7.9*  --   --   --   --  7.8* 7.8* 8.2* 8.4* 8.4*  MG 1.8  --   --  2.1  --  1.9  --  1.8  --  2.2  --  2.2  PHOS 3.6  --  3.6  --   --  2.2*  --  2.1*  --  2.5  --   --    < > = values in this interval not displayed.   GFR: Estimated Creatinine Clearance: 74.7 mL/min (A) (by C-G formula based on SCr of 1.01 mg/dL (H)). Recent Labs  Lab 06/13/2018 1400 06/01/2018 1650 06/02/2018 1937  05/25/18 0242 05/26/18 0732 05/27/18 0636 05/28/18 0338  PROCALCITON  --   --  0.32  --   --   --  0.12  --   WBC 13.7*  --  8.5   < > 10.2 10.2 8.1 7.9  LATICACIDVEN 2.5* 1.7  --   --   --   --   --   --    < > = values in this interval not displayed.    Liver Function Tests: Recent Labs  Lab 05/23/18 0431 05/24/18 0341 05/25/18 0242 05/26/18 0732 05/27/18 0636  AST 70* 63* 57* 49* 43*  ALT 65* 50* 42 35 29  ALKPHOS 94 84 89 81 76  BILITOT 1.9* 2.2* 1.8* 1.4* 1.5*  PROT 6.3* 5.9* 6.2* 6.7 6.6  ALBUMIN 2.5* 2.2* 2.1* 2.2* 2.1*   No results for input(s): LIPASE, AMYLASE in the  last 168 hours. No results for input(s): AMMONIA in the last 168 hours.  ABG    Component Value Date/Time   PHART 7.522 (H) 05/25/2018 0237   PCO2ART 54.3 (H) 05/25/2018 0237   PO2ART 104.0 05/25/2018 0237   HCO3 44.5 (H) 05/25/2018 0237   TCO2 46 (H) 05/25/2018 0237   O2SAT 98.0 05/25/2018 0237     Coagulation Profile: No results for input(s): INR, PROTIME in the last 168 hours.   Cardiac Enzymes: Recent Labs  Lab 05/23/18 0431 05/24/18 0341 05/25/18 0242 05/26/18 0732 05/27/18 0636  CKTOTAL 26* 20* 24* 15* 17*    HbA1C: Hgb A1c MFr Bld  Date/Time Value Ref Range Status  03/02/2018 04:43 PM 5.2 4.8 - 5.6 % Final    Comment:             Prediabetes: 5.7 - 6.4          Diabetes: >6.4          Glycemic control for adults with diabetes: <7.0     CBG: Recent Labs  Lab 05/27/18 1503 05/27/18 2008 05/27/18 2346 05/28/18 0325 05/28/18 0746  GLUCAP 117* 126* 103* 113* 113*   The patient is critically ill with multiple organ system failure and requires high complexity decision making for assessment and support, frequent evaluation and titration of therapies, advanced monitoring, review of radiographic studies and interpretation of complex data.   Critical Care Time devoted to patient care services, exclusive of separately billable procedures, described in this note is 35 minutes.   Marshell Garfinkel MD [facility] Pager [phone] If no answer call 336 [phone] 05/28/2018, 11:47 AM

## 2018-05-28 NOTE — Progress Notes (Addendum)
Nutrition Follow-up RD working remotely.  DOCUMENTATION CODES:   Morbid obesity  INTERVENTION:   Continue TF via OGT:  Increase Vital High Protein to goal rate of 70 ml/h  Provides 1680 kcal, 147 gm protein, 1404 ml free water daily  Continue 200 ml free water flushes TID  NUTRITION DIAGNOSIS:   Inadequate oral intake related to inability to eat as evidenced by NPO status.  Ongoing   GOAL:   Provide needs based on ASPEN/SCCM guidelines  Met with TF  MONITOR:   Vent status, TF tolerance, Labs, Skin, I & O's  ASSESSMENT:   68 yo female with PMH of obesity, DM, HTN, HLD, CKD, stage 2, stroke who was admitted with SVT, hypoxemia, COVID-19 positive. Required intubation 4/4.  Patient remains intubated on ventilator support Temp (24hrs), Avg:98.9 F (37.2 C), Min:98.5 F (36.9 C), Max:99.3 F (37.4 C)   Currently receiving Vital High Protein via OGT at 40 ml/h (960 ml/day) with Prostat 30 ml BID to provide 1160 kcals, 114 gm protein, 803 ml free water daily. Tolerating TF well.  Labs reviewed. CBG's: [id number] Medications reviewed and include Novolog, free water 200 ml every 8 hours, levophed weaned off.     NUTRITION - FOCUSED PHYSICAL EXAM:  unable to complete  Diet Order:   Diet Order    None      EDUCATION NEEDS:   No education needs have been identified at this time  Skin:  Skin Assessment: Skin Integrity Issues: Skin Integrity Issues:: Other (Comment) Other: open blister L pretibial; MASD to breast, abdomen, groin  Last BM:  none documented since admission  Height:   Ht Readings from Last 1 Encounters:  05/23/18 '5\' 8"'$  (1.727 m)    Weight:   Wt Readings from Last 1 Encounters:  05/28/18 122.8 kg    Ideal Body Weight:  63.6 kg  BMI:  Body mass index is 41.16 kg/m.  Estimated Nutritional Needs:   Kcal:  6979-4801  Protein:  130-160 gm  Fluid:  1.9 L    Molli Barrows, RD, LDN, [place] Pager [phone] After Hours Pager  [phone]

## 2018-05-29 DIAGNOSIS — I471 Supraventricular tachycardia: Secondary | ICD-10-CM

## 2018-05-29 DIAGNOSIS — Z9911 Dependence on respirator [ventilator] status: Secondary | ICD-10-CM

## 2018-05-29 LAB — GLUCOSE, CAPILLARY
Glucose-Capillary: 119 mg/dL — ABNORMAL HIGH (ref 70–99)
Glucose-Capillary: 124 mg/dL — ABNORMAL HIGH (ref 70–99)
Glucose-Capillary: 125 mg/dL — ABNORMAL HIGH (ref 70–99)
Glucose-Capillary: 139 mg/dL — ABNORMAL HIGH (ref 70–99)
Glucose-Capillary: 144 mg/dL — ABNORMAL HIGH (ref 70–99)

## 2018-05-29 LAB — BASIC METABOLIC PANEL
Anion gap: 13 (ref 5–15)
BUN: 36 mg/dL — ABNORMAL HIGH (ref 8–23)
CO2: 34 mmol/L — ABNORMAL HIGH (ref 22–32)
Calcium: 8.6 mg/dL — ABNORMAL LOW (ref 8.9–10.3)
Chloride: 96 mmol/L — ABNORMAL LOW (ref 98–111)
Creatinine, Ser: 1.08 mg/dL — ABNORMAL HIGH (ref 0.44–1.00)
GFR calc Af Amer: >60 mL/min (ref >60)
GFR calc non Af Amer: 53 mL/min — ABNORMAL LOW (ref >60)
Glucose, Bld: 191 mg/dL — ABNORMAL HIGH (ref 70–99)
Potassium: 4.3 mmol/L (ref 3.5–5.1)
Sodium: 143 mmol/L (ref 135–145)

## 2018-05-29 LAB — CBC
HCT: 32.2 % — ABNORMAL LOW (ref 36.0–46.0)
Hemoglobin: 9 g/dL — ABNORMAL LOW (ref 12.0–15.0)
MCH: 25.2 pg — ABNORMAL LOW (ref 26.0–34.0)
MCHC: 28 g/dL — ABNORMAL LOW (ref 30.0–36.0)
MCV: 90.2 fL (ref 80.0–100.0)
Platelets: 251 10*3/uL (ref 150–400)
RBC: 3.57 MIL/uL — ABNORMAL LOW (ref 3.87–5.11)
RDW: 19.3 % — ABNORMAL HIGH (ref 11.5–15.5)
WBC: 8.8 10*3/uL (ref 4.0–10.5)
nRBC: 0.6 % — ABNORMAL HIGH (ref 0.0–0.2)

## 2018-05-29 LAB — PHOSPHORUS: Phosphorus: 4.4 mg/dL (ref 2.5–4.6)

## 2018-05-29 LAB — MAGNESIUM: Magnesium: 2.4 mg/dL (ref 1.7–2.4)

## 2018-05-29 MED ORDER — METOPROLOL TARTRATE 5 MG/5ML IV SOLN
5.0000 mg | INTRAVENOUS | Status: DC | PRN
  Administered 2018-05-29 – 2018-06-06 (×9): 5 mg via INTRAVENOUS
  Administered 2018-06-07: 19:00:00 2.5 mg via INTRAVENOUS
  Administered 2018-06-10: 5 mg via INTRAVENOUS
  Filled 2018-05-29 (×12): qty 5

## 2018-05-29 MED ORDER — PHENYLEPHRINE HCL-NACL 10-0.9 MG/250ML-% IV SOLN
0.0000 ug/min | INTRAVENOUS | Status: DC
  Administered 2018-05-29 (×2): 30 ug/min via INTRAVENOUS
  Administered 2018-05-29: 13.333 ug/min via INTRAVENOUS
  Administered 2018-05-30: 30 ug/min via INTRAVENOUS
  Administered 2018-05-30 (×2): 60 ug/min via INTRAVENOUS
  Administered 2018-05-30: 130 ug/min via INTRAVENOUS
  Administered 2018-05-30: 10 ug/min via INTRAVENOUS
  Administered 2018-05-30: 85 ug/min via INTRAVENOUS
  Administered 2018-05-31: 30 ug/min via INTRAVENOUS
  Filled 2018-05-29 (×12): qty 250

## 2018-05-29 MED ORDER — BISACODYL 10 MG RE SUPP: 10.0000 mg | Freq: Every day | RECTAL | Status: DC | PRN

## 2018-05-29 MED ORDER — BISACODYL 10 MG RE SUPP
10.0000 mg | Freq: Once | RECTAL | Status: AC
  Administered 2018-05-29: 10 mg via RECTAL
  Filled 2018-05-29: qty 1

## 2018-05-29 MED ORDER — SENNOSIDES 8.8 MG/5ML PO SYRP
5.0000 mL | ORAL_SOLUTION | Freq: Every day | ORAL | Status: DC
  Administered 2018-05-29 – 2018-06-11 (×13): 5 mL via ORAL
  Filled 2018-05-29 (×14): qty 5

## 2018-05-29 MED ORDER — FENTANYL CITRATE (PF) 100 MCG/2ML IJ SOLN
50.0000 ug | INTRAMUSCULAR | Status: DC | PRN
  Administered 2018-05-29 (×2): 50 ug via INTRAVENOUS
  Administered 2018-05-29 – 2018-05-30 (×11): 100 ug via INTRAVENOUS
  Filled 2018-05-29 (×13): qty 2

## 2018-05-29 MED ORDER — DOCUSATE SODIUM 50 MG/5ML PO LIQD
50.0000 mg | Freq: Two times a day (BID) | ORAL | Status: DC
  Administered 2018-05-29 – 2018-06-14 (×23): 50 mg via ORAL
  Filled 2018-05-29 (×26): qty 10

## 2018-05-29 MED ORDER — METOPROLOL TARTRATE 25 MG/10 ML ORAL SUSPENSION
12.5000 mg | Freq: Three times a day (TID) | ORAL | Status: DC
  Administered 2018-05-29 – 2018-06-01 (×7): 12.5 mg via ORAL
  Filled 2018-05-29 (×15): qty 5

## 2018-05-29 NOTE — Progress Notes (Signed)
NAME:  Shannon Carroll, MRN:  [id number], DOB:  1950-10-26, LOS: 7 ADMISSION DATE:  06/17/2018, CONSULTATION DATE:  05/23/2018 REFERRING MD:  Glenna Durand, CHIEF COMPLAINT:  SVT hpoxemia, COVID 79 POS   Brief History   68 yr old female with PMHx HFpEF, COPD, w/ bilateral infiltrates COVID positive on supplemental oxygen 8L with increased work of breathing, fatigue, and  Diaphoretic. PCCM consulted for acute respiratory distress.  Significant Hospital Events   Respiratory distress: 05/23/2018  Consults:    Procedures:  Endotracheal intubation- 05/23/2018  Significant Diagnostic Tests:  Bilateral alveolar infiltrates on CXR -CXR 4/8 with worsening LLL and RML opacity   Micro Data:  Blood cx 05/21/2018- NGTD Novel Coronavirus Positive result called 4/4. Tracheal aspirate 4/7>>> moderate WBC (abundant GPC and GNR)  MRSA 4/5> neg   Antimicrobials:  Vancomycin- 05/23/2018 Cefepime- 05/23/2018-4/6, 4/8>>  Hydroxychloroquine- upon transfer to ICU -COVID (to end 4/9) Axithromycin - upon transfer to ICU- COVID (to end 4/8)  Interim history/subjective:  Stable on vent. Did not tolerate SBT this AM. SVT this AM, self converted with sedation and less agitation. Give dose of metoprolol as well .  Objective   Blood pressure 109/87, pulse 98, temperature 98.5 F (36.9 C), temperature source Axillary, resp. rate 18, height 5\' 8"  (1.727 m), weight 122.2 kg, SpO2 96 %.    Vent Mode: CPAP;PSV FiO2 (%):  [40 %] 40 % Set Rate:  [16 bmp] 16 bmp Vt Set:  [380 mL] 380 mL PEEP:  [5 cmH20] 5 cmH20 Pressure Support:  [5 cmH20] 5 cmH20 Plateau Pressure:  [14 cmH20] 14 cmH20   Intake/Output Summary (Last 24 hours) at 05/29/2018 0926 Last data filed at 05/29/2018 0700 Gross per 24 hour  Intake 1784.25 ml  Output 2175 ml  Net -390.75 ml   Filed Weights   05/27/18 0451 05/28/18 0332 05/29/18 0349  Weight: 123 kg 122.8 kg 122.2 kg    Examination:  Gen:      Intubated on vent, sedated, was very agitated this  morning and unfortable, during SBT  HEENT:  NCAT, not tracking while awake Neck:   Trachea midline, ETT in place  Lungs:    BL vented breath sounds  CV:         RRR, no mrg  Abd:    +BS, NTND  Ext:    No significant edema  Skin:      Warm dry no obv rash  Neuro:  Sedated on vent, dose move all extremities spontaneously   Resolved Hospital Problem list   Transaminits.   Assessment & Plan:   Acute Hypoxic Respiratory Failure on mechanical ventilation  -ARDS secondary to  COVID-19 viral pneumonia -R/o bacterial co-infection, cx with normal flora  P  Attempt weaning again today  Trial of PS/CPAP  Would be better if mental status was better before extubation  Completed dosing of plaq and azithro   Shock septic secondary to COVID-19 - off and on low dose pressor - titrate to MAP >44mmHG - may need to observe for any concern of cardiomyopathy late in disease course   Diabetes mellitus - follow glucose with cbgs and gice ssi   AKI on CKD: Follow UOP Observe Scr with BMT Avoid nephro toxic drugs   Constipation P Started bowel regimen Discussed with pharmacy   Hx Depression P Home dose effexor   SVT  - self converted - given prn metoprolol   Best practice:  Diet:tube feed Pain/Anxiety/Delirium protocol (if indicated): Continuous infusion VAP protocol (if indicated):  NA DVT prophylaxis: SCD, Lovenox GI prophylaxis: Protonix Glucose control: sliding scale Mobility: bed bound Code Status: Partial code Family Communication: I called the patients son, 4/10 Disposition: ICU   Labs   CBC: Recent Labs  Lab 05/23/18 0431  05/24/18 0341  05/25/18 0237 05/25/18 0242 05/26/18 0732 05/27/18 0636 05/28/18 0338  WBC 8.3  --  8.0  --   --  10.2 10.2 8.1 7.9  NEUTROABS 7.7  --  7.5  --   --  9.3* 8.7* 7.5  --   HGB 9.3*   < > 8.8*   < > 11.2* 9.1* 8.6* 7.9* 8.2*  HCT 32.4*   < > 29.4*   < > 33.0* 31.6* 31.1* 29.2* 28.6*  MCV 86.4  --  86.7  --   --  86.6 88.6 89.8  88.8  PLT 204  --  195  --   --  231 254 212 247   < > = values in this interval not displayed.    Basic Metabolic Panel: Recent Labs  Lab 05/24/18 0040  05/24/18 0341 05/24/18 0342  05/24/18 1821  05/25/18 0242 05/25/18 1700 05/26/18 0732 05/27/18 0636 05/28/18 0338  NA  --    < > 141  --    < >  --    < > 143 145 146* 144 145  K  --    < > 3.5  --    < >  --    < > 2.8* 3.7 3.8 4.2 3.8  CL  --   --  95*  --   --   --   --  95* 101 100 99 99  CO2  --   --  35*  --   --   --   --  36* 37* 37* 36* 36*  GLUCOSE  --   --  92  --   --   --   --  152* 127* 139* 136* 121*  BUN  --   --  32*  --   --   --   --  29* 27* 27* 34* 32*  CREATININE  --   --  1.27*  --   --   --   --  1.11* 1.08* 1.13* 1.07* 1.01*  CALCIUM  --   --  7.9*  --   --   --   --  7.8* 7.8* 8.2* 8.4* 8.4*  MG 1.8  --   --  2.1  --  1.9  --  1.8  --  2.2  --  2.2  PHOS 3.6  --  3.6  --   --  2.2*  --  2.1*  --  2.5  --   --    < > = values in this interval not displayed.   GFR: Estimated Creatinine Clearance: 74.4 mL/min (A) (by C-G formula based on SCr of 1.01 mg/dL (H)). Recent Labs  Lab 06/10/2018 1400 05/29/2018 1650 06/02/2018 1937  05/25/18 0242 05/26/18 0732 05/27/18 0636 05/28/18 0338  PROCALCITON  --   --  0.32  --   --   --  0.12  --   WBC 13.7*  --  8.5   < > 10.2 10.2 8.1 7.9  LATICACIDVEN 2.5* 1.7  --   --   --   --   --   --    < > = values in this interval not displayed.    Liver Function Tests: Recent Labs  Lab 05/23/18  6010 05/24/18 0341 05/25/18 0242 05/26/18 0732 05/27/18 0636  AST 70* 63* 57* 49* 43*  ALT 65* 50* 42 35 29  ALKPHOS 94 84 89 81 76  BILITOT 1.9* 2.2* 1.8* 1.4* 1.5*  PROT 6.3* 5.9* 6.2* 6.7 6.6  ALBUMIN 2.5* 2.2* 2.1* 2.2* 2.1*   No results for input(s): LIPASE, AMYLASE in the last 168 hours. No results for input(s): AMMONIA in the last 168 hours.  ABG    Component Value Date/Time   PHART 7.522 (H) 05/25/2018 0237   PCO2ART 54.3 (H) 05/25/2018 0237   PO2ART  104.0 05/25/2018 0237   HCO3 44.5 (H) 05/25/2018 0237   TCO2 46 (H) 05/25/2018 0237   O2SAT 98.0 05/25/2018 0237     Coagulation Profile: No results for input(s): INR, PROTIME in the last 168 hours.  Cardiac Enzymes: Recent Labs  Lab 05/23/18 0431 05/24/18 0341 05/25/18 0242 05/26/18 0732 05/27/18 0636  CKTOTAL 26* 20* 24* 15* 17*    HbA1C: Hgb A1c MFr Bld  Date/Time Value Ref Range Status  03/02/2018 04:43 PM 5.2 4.8 - 5.6 % Final    Comment:             Prediabetes: 5.7 - 6.4          Diabetes: >6.4          Glycemic control for adults with diabetes: <7.0     CBG: Recent Labs  Lab 05/28/18 1201 05/28/18 1644 05/28/18 2019 05/29/18 0000 05/29/18 0344  GLUCAP 130* 100* 103* 125* 124*     This patient is critically ill with multiple organ system failure; which, requires frequent high complexity decision making, assessment, support, evaluation, and titration of therapies. This was completed through the application of advanced monitoring technologies and extensive interpretation of multiple databases. During this encounter critical care time was devoted to patient care services described in this note for 38 minutes.   Garner Nash, DO [facility] 05/29/2018 9:26 AM  Personal pager: [phone] If unanswered, please page CCM On-call: [phone]

## 2018-05-29 NOTE — Progress Notes (Signed)
Assisted tele visit to patient with wife.  Gregor Dershem P, RN  

## 2018-05-30 ENCOUNTER — Inpatient Hospital Stay (HOSPITAL_COMMUNITY): Payer: Medicare Other

## 2018-05-30 ENCOUNTER — Inpatient Hospital Stay: Payer: Self-pay

## 2018-05-30 LAB — POCT I-STAT 7, (LYTES, BLD GAS, ICA,H+H)
Acid-Base Excess: 16 mmol/L — ABNORMAL HIGH (ref 0.0–2.0)
Bicarbonate: 41.6 mmol/L — ABNORMAL HIGH (ref 20.0–28.0)
Calcium, Ion: 1.15 mmol/L (ref 1.15–1.40)
HCT: 27 % — ABNORMAL LOW (ref 36.0–46.0)
Hemoglobin: 9.2 g/dL — ABNORMAL LOW (ref 12.0–15.0)
O2 Saturation: 97 %
Patient temperature: 98.6
Potassium: 3.4 mmol/L — ABNORMAL LOW (ref 3.5–5.1)
Sodium: 144 mmol/L (ref 135–145)
TCO2: 43 mmol/L — ABNORMAL HIGH (ref 22–32)
pCO2 arterial: 57.3 mmHg — ABNORMAL HIGH (ref 32.0–48.0)
pH, Arterial: 7.469 — ABNORMAL HIGH (ref 7.350–7.450)
pO2, Arterial: 94 mmHg (ref 83.0–108.0)

## 2018-05-30 LAB — COMPREHENSIVE METABOLIC PANEL
ALT: 21 U/L (ref 0–44)
AST: 37 U/L (ref 15–41)
Albumin: 2 g/dL — ABNORMAL LOW (ref 3.5–5.0)
Alkaline Phosphatase: 87 U/L (ref 38–126)
Anion gap: 12 (ref 5–15)
BUN: 34 mg/dL — ABNORMAL HIGH (ref 8–23)
CO2: 34 mmol/L — ABNORMAL HIGH (ref 22–32)
Calcium: 8.2 mg/dL — ABNORMAL LOW (ref 8.9–10.3)
Chloride: 98 mmol/L (ref 98–111)
Creatinine, Ser: 0.87 mg/dL (ref 0.44–1.00)
GFR calc Af Amer: >60 mL/min (ref >60)
GFR calc non Af Amer: >60 mL/min (ref >60)
Glucose, Bld: 125 mg/dL — ABNORMAL HIGH (ref 70–99)
Potassium: 3.5 mmol/L (ref 3.5–5.1)
Sodium: 144 mmol/L (ref 135–145)
Total Bilirubin: 1.3 mg/dL — ABNORMAL HIGH (ref 0.3–1.2)
Total Protein: 6.5 g/dL (ref 6.5–8.1)

## 2018-05-30 LAB — GLUCOSE, CAPILLARY
Glucose-Capillary: 104 mg/dL — ABNORMAL HIGH (ref 70–99)
Glucose-Capillary: 113 mg/dL — ABNORMAL HIGH (ref 70–99)
Glucose-Capillary: 117 mg/dL — ABNORMAL HIGH (ref 70–99)
Glucose-Capillary: 118 mg/dL — ABNORMAL HIGH (ref 70–99)
Glucose-Capillary: 124 mg/dL — ABNORMAL HIGH (ref 70–99)
Glucose-Capillary: 153 mg/dL — ABNORMAL HIGH (ref 70–99)
Glucose-Capillary: 96 mg/dL (ref 70–99)

## 2018-05-30 LAB — LACTATE DEHYDROGENASE: LDH: 260 U/L — ABNORMAL HIGH (ref 98–192)

## 2018-05-30 LAB — C-REACTIVE PROTEIN: CRP: 13.4 mg/dL — ABNORMAL HIGH (ref <1.0)

## 2018-05-30 LAB — CBC
HCT: 29.8 % — ABNORMAL LOW (ref 36.0–46.0)
Hemoglobin: 8.2 g/dL — ABNORMAL LOW (ref 12.0–15.0)
MCH: 24.3 pg — ABNORMAL LOW (ref 26.0–34.0)
MCHC: 27.5 g/dL — ABNORMAL LOW (ref 30.0–36.0)
MCV: 88.4 fL (ref 80.0–100.0)
Platelets: 278 10*3/uL (ref 150–400)
RBC: 3.37 MIL/uL — ABNORMAL LOW (ref 3.87–5.11)
RDW: 19.2 % — ABNORMAL HIGH (ref 11.5–15.5)
WBC: 7.9 10*3/uL (ref 4.0–10.5)
nRBC: 0.5 % — ABNORMAL HIGH (ref 0.0–0.2)

## 2018-05-30 LAB — FERRITIN: Ferritin: 347 ng/mL — ABNORMAL HIGH (ref 11–307)

## 2018-05-30 LAB — TROPONIN I: Troponin I: 0.03 ng/mL (ref <0.03)

## 2018-05-30 LAB — LACTIC ACID, PLASMA: Lactic Acid, Venous: 1.1 mmol/L (ref 0.5–1.9)

## 2018-05-30 MED ORDER — FENTANYL CITRATE (PF) 100 MCG/2ML IJ SOLN
50.0000 ug | INTRAMUSCULAR | Status: DC | PRN
  Administered 2018-05-30 – 2018-06-01 (×6): 100 ug via INTRAVENOUS
  Administered 2018-06-01 (×2): 50 ug via INTRAVENOUS
  Administered 2018-06-03 – 2018-06-15 (×17): 100 ug via INTRAVENOUS
  Filled 2018-05-30 (×27): qty 2

## 2018-05-30 MED ORDER — ACETAZOLAMIDE SODIUM 500 MG IJ SOLR
500.0000 mg | Freq: Once | INTRAMUSCULAR | Status: AC
  Administered 2018-05-30: 500 mg via INTRAVENOUS
  Filled 2018-05-30: qty 500

## 2018-05-30 MED ORDER — POLYETHYLENE GLYCOL 3350 17 G PO PACK
17.0000 g | PACK | Freq: Two times a day (BID) | ORAL | Status: AC
  Administered 2018-05-30 – 2018-06-01 (×4): 17 g via ORAL
  Filled 2018-05-30 (×4): qty 1

## 2018-05-30 MED ORDER — SODIUM CHLORIDE 0.9% FLUSH: 10.0000 mL | INTRAVENOUS | Status: DC | PRN

## 2018-05-30 MED ORDER — CHLORHEXIDINE GLUCONATE CLOTH 2 % EX PADS
6.0000 | MEDICATED_PAD | Freq: Every day | CUTANEOUS | Status: DC
  Administered 2018-05-31 – 2018-06-15 (×14): 6 via TOPICAL

## 2018-05-30 MED ORDER — MIDAZOLAM HCL 2 MG/2ML IJ SOLN
1.0000 mg | INTRAMUSCULAR | Status: DC | PRN
  Administered 2018-05-30 – 2018-06-15 (×19): 2 mg via INTRAVENOUS
  Filled 2018-05-30 (×21): qty 2

## 2018-05-30 MED ORDER — SODIUM CHLORIDE 0.9% FLUSH
10.0000 mL | Freq: Two times a day (BID) | INTRAVENOUS | Status: DC
  Administered 2018-05-31: 10:00:00 10 mL
  Administered 2018-05-31: 20 mL
  Administered 2018-06-01: 10 mL
  Administered 2018-06-01: 10:00:00 30 mL
  Administered 2018-06-02 – 2018-06-13 (×18): 10 mL
  Administered 2018-06-14: 09:00:00 20 mL
  Administered 2018-06-14 – 2018-06-15 (×2): 10 mL

## 2018-05-30 NOTE — Progress Notes (Signed)
Attempted PIV insertion with ultrasound. All other veins >2cm deep, not suitable for 1.88" PIV. PICC line recommended to MD who placed order.

## 2018-05-30 NOTE — Progress Notes (Signed)
elink tele video call provided to son

## 2018-05-30 NOTE — Progress Notes (Signed)
Updated spouse per request re PICC procedure went well and is in place.

## 2018-05-30 NOTE — Progress Notes (Signed)
Peripherally Inserted Central Catheter/Midline Placement  The IV Nurse has discussed with the patient and/or persons authorized to consent for the patient, the purpose of this procedure and the potential benefits and risks involved with this procedure.  The benefits include less needle sticks, lab draws from the catheter, and the patient may be discharged home with the catheter. Risks include, but not limited to, infection, bleeding, blood clot (thrombus formation), and puncture of an artery; nerve damage and irregular heartbeat and possibility to perform a PICC exchange if needed/ordered by physician.  Alternatives to this procedure were also discussed.  Bard Power PICC patient education guide, fact sheet on infection prevention and patient information card has been provided to patient /or left at bedside.  Telephone consent obtained from husband and son.  All questions answered.  PICC/Midline Placement Documentation  PICC Double Lumen 90/24/09 PICC Left Cephalic 48 cm 0 cm (Active)  Indication for Insertion or Continuance of Line Limited venous access - need for IV therapy >5 days (PICC only) 05/30/2018  7:00 PM  Exposed Catheter (cm) 0 cm 05/30/2018  7:00 PM  Site Assessment Clean;Dry;Intact 05/30/2018  7:00 PM  Lumen #1 Status Flushed;Blood return noted;Saline locked 05/30/2018  7:00 PM  Lumen #2 Status Flushed;Blood return noted;Saline locked 05/30/2018  7:00 PM  Dressing Type Transparent 05/30/2018  7:00 PM  Dressing Status Clean;Dry;Intact;Antimicrobial disc in place 05/30/2018  7:00 PM  Line Care Connections checked and tightened 05/30/2018  7:00 PM  Line Adjustment (NICU/IV Team Only) No 05/30/2018  7:00 PM  Dressing Intervention New dressing 05/30/2018  7:00 PM  Dressing Change Due 06/06/18 05/30/2018  7:00 PM       Rolena Infante 05/30/2018, 7:17 PM

## 2018-05-30 NOTE — Progress Notes (Signed)
CRITICAL VALUE ALERT  Critical Value:  Troponin 0.03 Date & Time Notied:  05/30/18  1610  Provider Notified: Warren Lacy  Orders Received/Actions taken: Nothing at this time.

## 2018-05-30 NOTE — Progress Notes (Signed)
NAME:  Shannon Carroll, MRN:  [id number], DOB:  10-03-1950, LOS: 8 ADMISSION DATE:  06/11/2018, CONSULTATION DATE:  05/23/2018 REFERRING MD:  Glenna Durand, CHIEF COMPLAINT:  SVT hpoxemia, COVID 29 POS   Brief History   68 yr old female with PMHx HFpEF, COPD, w/ bilateral infiltrates COVID positive on supplemental oxygen 8L with increased work of breathing, fatigue, and diaphoretic. PCCM consulted for acute respiratory distress.  Significant Hospital Events   Respiratory distress: 05/23/2018  Consults:  PCCM   Procedures:  Endotracheal intubation- 05/23/2018  Significant Diagnostic Tests:  Bilateral alveolar infiltrates on CXR -CXR 4/8 with worsening LLL and RML opacity   Micro Data:  Blood cx 05/30/2018- NGTD Novel Coronavirus Positive result called 4/4. Tracheal aspirate 4/7>>> moderate WBC (abundant GPC and GNR)  MRSA 4/5> neg   Antimicrobials:  Vancomycin- 05/23/2018 Cefepime- 05/23/2018-4/6, 4/8>>  Hydroxychloroquine- upon transfer to ICU -COVID (to end 4/9) Axithromycin - upon transfer to ICU- COVID (to end 4/8)  Interim history/subjective:  Stable overnight. Remains on low dose phenylephrine. Tolerating SBT this AM.   Objective   Blood pressure (!) 106/32, pulse 71, temperature 98.4 F (36.9 C), temperature source Axillary, resp. rate 18, height 5\' 8"  (1.727 m), weight 122.2 kg, SpO2 100 %.    Vent Mode: CPAP;PSV FiO2 (%):  [40 %] 40 % Set Rate:  [16 bmp] 16 bmp Vt Set:  [380 mL] 380 mL PEEP:  [5 cmH20] 5 cmH20 Pressure Support:  [10 cmH20] 10 cmH20 Plateau Pressure:  [13 cmH20-17 cmH20] 17 cmH20   Intake/Output Summary (Last 24 hours) at 05/30/2018 1019 Last data filed at 05/30/2018 0900 Gross per 24 hour  Intake 3752.42 ml  Output 1175 ml  Net 2577.42 ml   Filed Weights   05/28/18 0332 05/29/18 0349 05/30/18 0345  Weight: 122.8 kg 122.2 kg 122.2 kg    Examination:  Gen:     Intubated, on vent, appears comfortable  HEENT:  NCAT, sclera clear  Neck:     Neck midline   Lungs:    BL vented breath sounds CV:        RRR, s1 s2  Abd:    +BS NTND  Ext:   No edema  Skin:     No rash  Neuro:  Sedated comfortable, follows commands   Resolved Hospital Problem list   Transaminits.   Assessment & Plan:   Acute Hypoxic Respiratory Failure on mechanical ventilation  -ARDS secondary to  COVID-19 viral pneumonia -R/o bacterial co-infection, cx with normal flora  P  SBT again today Let her ride as long as she can  Overall, looks comfortable but not ready to extubated We will observe and titrate vent needs as necessary   Shock septic secondary to COVID-19 - titrate off phenyephrine if possible - MAP goal >85mmHG   Diabetes mellitus - follow up glucose with cbg and ssi   AKI on CKD: Follow UOP Follow Scr on BMET   Constipation P Bowel regimen in place   Hx Depression P On home effexor dose   SVT  - self converted, in sinus   Best practice:  Diet:tube feed Pain/Anxiety/Delirium protocol (if indicated): Continuous infusion VAP protocol (if indicated): NA DVT prophylaxis: SCD, Lovenox GI prophylaxis: Protonix Glucose control: sliding scale Mobility: bed bound Code Status: Partial code Family Communication: I called the patients son, 4/10 Disposition: ICU   Labs   CBC: Recent Labs  Lab 05/24/18 0341  05/25/18 0242 05/26/18 0732 05/27/18 0636 05/28/18 0102 05/29/18 1517 05/30/18 0232 05/30/18  0327  WBC 8.0  --  10.2 10.2 8.1 7.9 8.8  --  7.9  NEUTROABS 7.5  --  9.3* 8.7* 7.5  --   --   --   --   HGB 8.8*   < > 9.1* 8.6* 7.9* 8.2* 9.0* 9.2* 8.2*  HCT 29.4*   < > 31.6* 31.1* 29.2* 28.6* 32.2* 27.0* 29.8*  MCV 86.7  --  86.6 88.6 89.8 88.8 90.2  --  88.4  PLT 195  --  231 254 212 247 251  --  278   < > = values in this interval not displayed.    Basic Metabolic Panel: Recent Labs  Lab 05/24/18 0341  05/24/18 1821  05/25/18 0242  05/26/18 0732 05/27/18 0636 05/28/18 0338 05/29/18 1053 05/30/18 0232 05/30/18 0327  NA 141    < >  --    < > 143   < > 146* 144 145 143 144 144  K 3.5   < >  --    < > 2.8*   < > 3.8 4.2 3.8 4.3 3.4* 3.5  CL 95*  --   --   --  95*   < > 100 99 99 96*  --  98  CO2 35*  --   --   --  36*   < > 37* 36* 36* 34*  --  34*  GLUCOSE 92  --   --   --  152*   < > 139* 136* 121* 191*  --  125*  BUN 32*  --   --   --  29*   < > 27* 34* 32* 36*  --  34*  CREATININE 1.27*  --   --   --  1.11*   < > 1.13* 1.07* 1.01* 1.08*  --  0.87  CALCIUM 7.9*  --   --   --  7.8*   < > 8.2* 8.4* 8.4* 8.6*  --  8.2*  MG  --    < > 1.9  --  1.8  --  2.2  --  2.2 2.4  --   --   PHOS 3.6  --  2.2*  --  2.1*  --  2.5  --   --  4.4  --   --    < > = values in this interval not displayed.   GFR: Estimated Creatinine Clearance: 86.4 mL/min (by C-G formula based on SCr of 0.87 mg/dL). Recent Labs  Lab 05/27/18 0636 05/28/18 0338 05/29/18 1517 05/30/18 0327  PROCALCITON 0.12  --   --   --   WBC 8.1 7.9 8.8 7.9  LATICACIDVEN  --   --   --  1.1    Liver Function Tests: Recent Labs  Lab 05/24/18 0341 05/25/18 0242 05/26/18 0732 05/27/18 0636 05/30/18 0327  AST 63* 57* 49* 43* 37  ALT 50* 42 35 29 21  ALKPHOS 84 89 81 76 87  BILITOT 2.2* 1.8* 1.4* 1.5* 1.3*  PROT 5.9* 6.2* 6.7 6.6 6.5  ALBUMIN 2.2* 2.1* 2.2* 2.1* 2.0*   No results for input(s): LIPASE, AMYLASE in the last 168 hours. No results for input(s): AMMONIA in the last 168 hours.  ABG    Component Value Date/Time   PHART 7.469 (H) 05/30/2018 0232   PCO2ART 57.3 (H) 05/30/2018 0232   PO2ART 94.0 05/30/2018 0232   HCO3 41.6 (H) 05/30/2018 0232   TCO2 43 (H) 05/30/2018 0232   O2SAT 97.0 05/30/2018 0232  Coagulation Profile: No results for input(s): INR, PROTIME in the last 168 hours.  Cardiac Enzymes: Recent Labs  Lab 05/24/18 0341 05/25/18 0242 05/26/18 0732 05/27/18 0636 05/30/18 0327  CKTOTAL 20* 24* 15* 17*  --   TROPONINI  --   --   --   --  0.03*    HbA1C: Hgb A1c MFr Bld  Date/Time Value Ref Range Status   03/02/2018 04:43 PM 5.2 4.8 - 5.6 % Final    Comment:             Prediabetes: 5.7 - 6.4          Diabetes: >6.4          Glycemic control for adults with diabetes: <7.0     CBG: Recent Labs  Lab 05/29/18 1554 05/29/18 1949 05/30/18 0007 05/30/18 0339 05/30/18 0840  GLUCAP 144* 119* 113* 117* 153*    This patient is critically ill with multiple organ system failure; which, requires frequent high complexity decision making, assessment, support, evaluation, and titration of therapies. This was completed through the application of advanced monitoring technologies and extensive interpretation of multiple databases. During this encounter critical care time was devoted to patient care services described in this note for 34 minutes.  Garner Nash, DO [facility] 05/30/2018 10:20 AM  Personal pager: [phone] If unanswered, please page CCM On-call: [phone]

## 2018-05-30 NOTE — Progress Notes (Signed)
elink tele visit provided to another son

## 2018-05-31 DIAGNOSIS — R945 Abnormal results of liver function studies: Secondary | ICD-10-CM

## 2018-05-31 DIAGNOSIS — I10 Essential (primary) hypertension: Secondary | ICD-10-CM

## 2018-05-31 LAB — COMPREHENSIVE METABOLIC PANEL
ALT: 21 U/L (ref 0–44)
AST: 31 U/L (ref 15–41)
Albumin: 1.8 g/dL — ABNORMAL LOW (ref 3.5–5.0)
Alkaline Phosphatase: 93 U/L (ref 38–126)
Anion gap: 10 (ref 5–15)
BUN: 32 mg/dL — ABNORMAL HIGH (ref 8–23)
CO2: 35 mmol/L — ABNORMAL HIGH (ref 22–32)
Calcium: 8.2 mg/dL — ABNORMAL LOW (ref 8.9–10.3)
Chloride: 100 mmol/L (ref 98–111)
Creatinine, Ser: 0.87 mg/dL (ref 0.44–1.00)
GFR calc Af Amer: >60 mL/min (ref >60)
GFR calc non Af Amer: >60 mL/min (ref >60)
Glucose, Bld: 111 mg/dL — ABNORMAL HIGH (ref 70–99)
Potassium: 3.4 mmol/L — ABNORMAL LOW (ref 3.5–5.1)
Sodium: 145 mmol/L (ref 135–145)
Total Bilirubin: 1 mg/dL (ref 0.3–1.2)
Total Protein: 5.8 g/dL — ABNORMAL LOW (ref 6.5–8.1)

## 2018-05-31 LAB — GLUCOSE, CAPILLARY
Glucose-Capillary: 107 mg/dL — ABNORMAL HIGH (ref 70–99)
Glucose-Capillary: 108 mg/dL — ABNORMAL HIGH (ref 70–99)
Glucose-Capillary: 120 mg/dL — ABNORMAL HIGH (ref 70–99)
Glucose-Capillary: 142 mg/dL — ABNORMAL HIGH (ref 70–99)
Glucose-Capillary: 160 mg/dL — ABNORMAL HIGH (ref 70–99)
Glucose-Capillary: 92 mg/dL (ref 70–99)

## 2018-05-31 LAB — CBC
HCT: 27.2 % — ABNORMAL LOW (ref 36.0–46.0)
Hemoglobin: 7.4 g/dL — ABNORMAL LOW (ref 12.0–15.0)
MCH: 24.8 pg — ABNORMAL LOW (ref 26.0–34.0)
MCHC: 27.2 g/dL — ABNORMAL LOW (ref 30.0–36.0)
MCV: 91.3 fL (ref 80.0–100.0)
Platelets: 277 10*3/uL (ref 150–400)
RBC: 2.98 MIL/uL — ABNORMAL LOW (ref 3.87–5.11)
RDW: 19.7 % — ABNORMAL HIGH (ref 11.5–15.5)
WBC: 7.9 10*3/uL (ref 4.0–10.5)
nRBC: 0.4 % — ABNORMAL HIGH (ref 0.0–0.2)

## 2018-05-31 LAB — LACTIC ACID, PLASMA: Lactic Acid, Venous: 0.8 mmol/L (ref 0.5–1.9)

## 2018-05-31 LAB — TROPONIN I: Troponin I: 0.03 ng/mL (ref <0.03)

## 2018-05-31 MED ORDER — POTASSIUM CHLORIDE 20 MEQ/15ML (10%) PO SOLN
40.0000 meq | Freq: Every day | ORAL | Status: AC
  Administered 2018-05-31: 11:00:00 40 meq
  Filled 2018-05-31: qty 30

## 2018-05-31 MED ORDER — FUROSEMIDE 10 MG/ML IJ SOLN
40.0000 mg | Freq: Four times a day (QID) | INTRAMUSCULAR | Status: AC
  Administered 2018-05-31 (×3): 40 mg via INTRAVENOUS
  Filled 2018-05-31 (×3): qty 4

## 2018-05-31 NOTE — Progress Notes (Signed)
Assisted tele visit to patient with pt pastor to provide prayer  Lenox Ahr, RN

## 2018-05-31 NOTE — Progress Notes (Signed)
NAME:  Shannon Carroll, MRN:  [id number], DOB:  January 02, 1951, LOS: 9 ADMISSION DATE:  06/16/2018, CONSULTATION DATE:  05/23/2018 REFERRING MD:  Glenna Durand, CHIEF COMPLAINT:  SVT hpoxemia, COVID 72 POS   Brief History   68 yr old female with PMHx HFpEF, COPD, w/ bilateral infiltrates COVID positive on supplemental oxygen 8L with increased work of breathing, fatigue, and diaphoretic. PCCM consulted for acute respiratory distress.  Significant Hospital Events   4/3 + COVID -19  Respiratory distress: 05/23/2018, ICU admit intubated   Consults:  PCCM   Procedures:  Endotracheal intubation- 05/23/2018  Significant Diagnostic Tests:  Bilateral alveolar infiltrates on CXR -CXR 4/8 with worsening LLL and RML opacity   Micro Data:  Blood cx 06/12/2018- NGTD Novel Coronavirus Positive result called 4/4. Tracheal aspirate 4/7>>> moderate WBC (abundant GPC and GNR)  MRSA 4/5> neg   Antimicrobials:  Vancomycin- 05/23/2018 Cefepime- 05/23/2018-4/6, 4/8>>  Hydroxychloroquine- upon transfer to ICU -COVID (to end 4/9) Axithromycin - upon transfer to ICU- COVID (to end 4/8)  Interim history/subjective:  Stable overnight. Did not tolerate SBT this am. Desat, increased HR   Objective   Blood pressure 114/86, pulse 93, temperature 97.8 F (36.6 C), temperature source Axillary, resp. rate 17, height 5\' 8"  (1.727 m), weight 124.1 kg, SpO2 98 %.    Vent Mode: CPAP;PSV FiO2 (%):  [40 %] 40 % Set Rate:  [16 bmp] 16 bmp Vt Set:  [380 mL-510 mL] 510 mL PEEP:  [5 cmH20-8 cmH20] 5 cmH20 Pressure Support:  [10 cmH20-15 cmH20] 10 cmH20 Plateau Pressure:  [20 cmH20-21 cmH20] 21 cmH20   Intake/Output Summary (Last 24 hours) at 05/31/2018 1044 Last data filed at 05/31/2018 0536 Gross per 24 hour  Intake 2439.46 ml  Output 1450 ml  Net 989.46 ml   Filed Weights   05/29/18 0349 05/30/18 0345 05/31/18 0500  Weight: 122.2 kg 122.2 kg 124.1 kg    Examination:  Gen:     Intubated, on vent, appears comfortable   HEENT:  NCAT, sclera clear  Neck:     Neck midline  Lungs:    BL vented breath sounds CV:        RRR, s1 s2  Abd:    +BS NTND  Ext:   No edema  Skin:     No rash  Neuro:  Sedated comfortable, follows commands   Resolved Hospital Problem list   Transaminits.   Assessment & Plan:   Acute Hypoxic Respiratory Failure on mechanical ventilation  -ARDS secondary to  COVID-19 viral pneumonia -R/o bacterial co-infection, cx with normal flora  P  Tolerating SBT today  We will consider extubating - failed sbt this am due to SVT 160s and desaturating  Holding all sedation  We will given additional diuresis today   Shock septic secondary to COVID-19 - plans to titrate off neo today  - MAP better with holding continuous sedation   Diabetes mellitus - cbgs with ssi   AKI on CKD, Positive CFB+  Follow UOP  Diuresis again today   Constipation P bm regimen in place  Hx Depression P Home effexor dosing   SVT  In sinus now We will follow   Good candidate for transfer to [place].   Best practice:  Diet:tube feed Pain/Anxiety/Delirium protocol (if indicated): Continuous infusion VAP protocol (if indicated): NA DVT prophylaxis: SCD, Lovenox GI prophylaxis: Protonix Glucose control: sliding scale Mobility: bed bound Code Status: Partial code - REMAINS NO CPR, but if WE REMOVED TUBE WOULD BE OK IF  WE PUT BACK Family Communication: I spoke with patients son Disposition: ICU   Labs   CBC: Recent Labs  Lab 05/25/18 0242 05/26/18 0732 05/27/18 0636 05/28/18 7001 05/29/18 1517 05/30/18 0232 05/30/18 0327 05/31/18 0521  WBC 10.2 10.2 8.1 7.9 8.8  --  7.9 7.9  NEUTROABS 9.3* 8.7* 7.5  --   --   --   --   --   HGB 9.1* 8.6* 7.9* 8.2* 9.0* 9.2* 8.2* 7.4*  HCT 31.6* 31.1* 29.2* 28.6* 32.2* 27.0* 29.8* 27.2*  MCV 86.6 88.6 89.8 88.8 90.2  --  88.4 91.3  PLT 231 254 212 247 251  --  278 749    Basic Metabolic Panel: Recent Labs  Lab 05/24/18 1821  05/25/18 0242  05/26/18  0732 05/27/18 0636 05/28/18 0338 05/29/18 1053 05/30/18 0232 05/30/18 0327 05/31/18 0521  NA  --    < > 143   < > 146* 144 145 143 144 144 145  K  --    < > 2.8*   < > 3.8 4.2 3.8 4.3 3.4* 3.5 3.4*  CL  --   --  95*   < > 100 99 99 96*  --  98 100  CO2  --   --  36*   < > 37* 36* 36* 34*  --  34* 35*  GLUCOSE  --   --  152*   < > 139* 136* 121* 191*  --  125* 111*  BUN  --   --  29*   < > 27* 34* 32* 36*  --  34* 32*  CREATININE  --   --  1.11*   < > 1.13* 1.07* 1.01* 1.08*  --  0.87 0.87  CALCIUM  --   --  7.8*   < > 8.2* 8.4* 8.4* 8.6*  --  8.2* 8.2*  MG 1.9  --  1.8  --  2.2  --  2.2 2.4  --   --   --   PHOS 2.2*  --  2.1*  --  2.5  --   --  4.4  --   --   --    < > = values in this interval not displayed.   GFR: Estimated Creatinine Clearance: 87.2 mL/min (by C-G formula based on SCr of 0.87 mg/dL). Recent Labs  Lab 05/27/18 0636 05/28/18 0338 05/29/18 1517 05/30/18 0327 05/31/18 0521 05/31/18 0523  PROCALCITON 0.12  --   --   --   --   --   WBC 8.1 7.9 8.8 7.9 7.9  --   LATICACIDVEN  --   --   --  1.1  --  0.8    Liver Function Tests: Recent Labs  Lab 05/25/18 0242 05/26/18 0732 05/27/18 0636 05/30/18 0327 05/31/18 0521  AST 57* 49* 43* 37 31  ALT 42 35 29 21 21   ALKPHOS 89 81 76 87 93  BILITOT 1.8* 1.4* 1.5* 1.3* 1.0  PROT 6.2* 6.7 6.6 6.5 5.8*  ALBUMIN 2.1* 2.2* 2.1* 2.0* 1.8*   No results for input(s): LIPASE, AMYLASE in the last 168 hours. No results for input(s): AMMONIA in the last 168 hours.  ABG    Component Value Date/Time   PHART 7.469 (H) 05/30/2018 0232   PCO2ART 57.3 (H) 05/30/2018 0232   PO2ART 94.0 05/30/2018 0232   HCO3 41.6 (H) 05/30/2018 0232   TCO2 43 (H) 05/30/2018 0232   O2SAT 97.0 05/30/2018 0232     Coagulation Profile: No results  for input(s): INR, PROTIME in the last 168 hours.  Cardiac Enzymes: Recent Labs  Lab 05/25/18 0242 05/26/18 0732 05/27/18 0636 05/30/18 0327 05/31/18 0521  CKTOTAL 24* 15* 17*  --   --    TROPONINI  --   --   --  0.03* 0.03*    HbA1C: Hgb A1c MFr Bld  Date/Time Value Ref Range Status  03/02/2018 04:43 PM 5.2 4.8 - 5.6 % Final    Comment:             Prediabetes: 5.7 - 6.4          Diabetes: >6.4          Glycemic control for adults with diabetes: <7.0     CBG: Recent Labs  Lab 05/30/18 1621 05/30/18 2119 05/31/18 0025 05/31/18 0501 05/31/18 0803  GLUCAP 124* 104* 142* 92 107*    This patient is critically ill with multiple organ system failure; which, requires frequent high complexity decision making, assessment, support, evaluation, and titration of therapies. This was completed through the application of advanced monitoring technologies and extensive interpretation of multiple databases. During this encounter critical care time was devoted to patient care services described in this note for 34 minutes.   Garner Nash, DO [facility] 05/31/2018 10:44 AM  Personal pager: [phone] If unanswered, please page CCM On-call: [phone]

## 2018-05-31 NOTE — Progress Notes (Signed)
Assisted tele visit to patient with son.  Kaeleb Emond M, RN  

## 2018-05-31 NOTE — Progress Notes (Signed)
Assisted tele visit to patient with son.  Harlem Thresher P, RN  

## 2018-06-01 ENCOUNTER — Ambulatory Visit: Payer: Medicare Other | Admitting: Cardiology

## 2018-06-01 DIAGNOSIS — F0150 Vascular dementia without behavioral disturbance: Secondary | ICD-10-CM

## 2018-06-01 LAB — CBC
HCT: 26.5 % — ABNORMAL LOW (ref 36.0–46.0)
Hemoglobin: 7.5 g/dL — ABNORMAL LOW (ref 12.0–15.0)
MCH: 25.4 pg — ABNORMAL LOW (ref 26.0–34.0)
MCHC: 28.3 g/dL — ABNORMAL LOW (ref 30.0–36.0)
MCV: 89.8 fL (ref 80.0–100.0)
Platelets: 265 10*3/uL (ref 150–400)
RBC: 2.95 MIL/uL — ABNORMAL LOW (ref 3.87–5.11)
RDW: 20.4 % — ABNORMAL HIGH (ref 11.5–15.5)
WBC: 8.3 10*3/uL (ref 4.0–10.5)
nRBC: 0.2 % (ref 0.0–0.2)

## 2018-06-01 LAB — POCT I-STAT, CHEM 8
BUN: 38 mg/dL — ABNORMAL HIGH (ref 8–23)
Calcium, Ion: 1.11 mmol/L — ABNORMAL LOW (ref 1.15–1.40)
Chloride: 104 mmol/L (ref 98–111)
Creatinine, Ser: 0.9 mg/dL (ref 0.44–1.00)
Glucose, Bld: 142 mg/dL — ABNORMAL HIGH (ref 70–99)
HCT: 26 % — ABNORMAL LOW (ref 36.0–46.0)
Hemoglobin: 8.8 g/dL — ABNORMAL LOW (ref 12.0–15.0)
Potassium: 4.3 mmol/L (ref 3.5–5.1)
Sodium: 145 mmol/L (ref 135–145)
TCO2: 31 mmol/L (ref 22–32)

## 2018-06-01 LAB — TYPE AND SCREEN
ABO/RH(D): O POS
Antibody Screen: NEGATIVE

## 2018-06-01 LAB — POCT I-STAT 7, (LYTES, BLD GAS, ICA,H+H)
Acid-Base Excess: 10 mmol/L — ABNORMAL HIGH (ref 0.0–2.0)
Bicarbonate: 33.7 mmol/L — ABNORMAL HIGH (ref 20.0–28.0)
Calcium, Ion: 1.13 mmol/L — ABNORMAL LOW (ref 1.15–1.40)
HCT: 28 % — ABNORMAL LOW (ref 36.0–46.0)
Hemoglobin: 9.5 g/dL — ABNORMAL LOW (ref 12.0–15.0)
O2 Saturation: 94 %
Patient temperature: 98.6
Potassium: 3.3 mmol/L — ABNORMAL LOW (ref 3.5–5.1)
Sodium: 145 mmol/L (ref 135–145)
TCO2: 35 mmol/L — ABNORMAL HIGH (ref 22–32)
pCO2 arterial: 42.6 mmHg (ref 32.0–48.0)
pH, Arterial: 7.506 — ABNORMAL HIGH (ref 7.350–7.450)
pO2, Arterial: 67 mmHg — ABNORMAL LOW (ref 83.0–108.0)

## 2018-06-01 LAB — GLUCOSE, CAPILLARY
Glucose-Capillary: 114 mg/dL — ABNORMAL HIGH (ref 70–99)
Glucose-Capillary: 118 mg/dL — ABNORMAL HIGH (ref 70–99)
Glucose-Capillary: 129 mg/dL — ABNORMAL HIGH (ref 70–99)
Glucose-Capillary: 137 mg/dL — ABNORMAL HIGH (ref 70–99)
Glucose-Capillary: 139 mg/dL — ABNORMAL HIGH (ref 70–99)
Glucose-Capillary: 153 mg/dL — ABNORMAL HIGH (ref 70–99)

## 2018-06-01 LAB — LACTIC ACID, PLASMA: Lactic Acid, Venous: 1 mmol/L (ref 0.5–1.9)

## 2018-06-01 LAB — COMPREHENSIVE METABOLIC PANEL
ALT: 21 U/L (ref 0–44)
AST: 31 U/L (ref 15–41)
Albumin: 2 g/dL — ABNORMAL LOW (ref 3.5–5.0)
Alkaline Phosphatase: 96 U/L (ref 38–126)
Anion gap: 13 (ref 5–15)
BUN: 32 mg/dL — ABNORMAL HIGH (ref 8–23)
CO2: 32 mmol/L (ref 22–32)
Calcium: 8.4 mg/dL — ABNORMAL LOW (ref 8.9–10.3)
Chloride: 101 mmol/L (ref 98–111)
Creatinine, Ser: 1.04 mg/dL — ABNORMAL HIGH (ref 0.44–1.00)
GFR calc Af Amer: >60 mL/min (ref >60)
GFR calc non Af Amer: 56 mL/min — ABNORMAL LOW (ref >60)
Glucose, Bld: 161 mg/dL — ABNORMAL HIGH (ref 70–99)
Potassium: 3 mmol/L — ABNORMAL LOW (ref 3.5–5.1)
Sodium: 146 mmol/L — ABNORMAL HIGH (ref 135–145)
Total Bilirubin: 1.2 mg/dL (ref 0.3–1.2)
Total Protein: 6 g/dL — ABNORMAL LOW (ref 6.5–8.1)

## 2018-06-01 LAB — ABO/RH: ABO/RH(D): O POS

## 2018-06-01 LAB — TROPONIN I: Troponin I: <0.03 ng/mL (ref <0.03)

## 2018-06-01 MED ORDER — PRO-STAT SUGAR FREE PO LIQD
60.0000 mL | Freq: Three times a day (TID) | ORAL | Status: DC
  Administered 2018-06-01 – 2018-06-04 (×12): 60 mL via ORAL
  Filled 2018-06-01 (×12): qty 60

## 2018-06-01 MED ORDER — SODIUM CHLORIDE 0.9 % IV BOLUS
1000.0000 mL | Freq: Once | INTRAVENOUS | Status: AC
  Administered 2018-06-01: 1000 mL via INTRAVENOUS

## 2018-06-01 MED ORDER — DEXMEDETOMIDINE HCL IN NACL 400 MCG/100ML IV SOLN
0.4000 ug/kg/h | INTRAVENOUS | Status: DC
  Administered 2018-06-01: 12:00:00 0.6 ug/kg/h via INTRAVENOUS
  Administered 2018-06-02 (×2): 0.4 ug/kg/h via INTRAVENOUS
  Filled 2018-06-01 (×5): qty 100

## 2018-06-01 MED ORDER — NOREPINEPHRINE 4 MG/250ML-% IV SOLN
0.0000 ug/min | INTRAVENOUS | Status: DC
  Administered 2018-06-01: 16:00:00 via INTRAVENOUS

## 2018-06-01 MED ORDER — FUROSEMIDE 40 MG PO TABS
80.0000 mg | ORAL_TABLET | Freq: Every day | ORAL | Status: DC
  Filled 2018-06-01 (×2): qty 2

## 2018-06-01 MED ORDER — JEVITY 1.2 CAL PO LIQD
1000.0000 mL | ORAL | Status: DC
  Administered 2018-06-01 – 2018-06-09 (×9): 1000 mL
  Filled 2018-06-01 (×9): qty 1000

## 2018-06-01 MED ORDER — ATORVASTATIN CALCIUM 10 MG PO TABS
20.0000 mg | ORAL_TABLET | Freq: Every day | ORAL | Status: DC
  Administered 2018-06-01 – 2018-06-07 (×7): 20 mg
  Filled 2018-06-01 (×2): qty 2
  Filled 2018-06-01 (×2): qty 1
  Filled 2018-06-01 (×5): qty 2

## 2018-06-01 MED ORDER — POTASSIUM CHLORIDE 20 MEQ/15ML (10%) PO SOLN
30.0000 meq | ORAL | Status: AC
  Administered 2018-06-01 (×2): 30 meq
  Filled 2018-06-01 (×2): qty 30

## 2018-06-01 MED ORDER — POTASSIUM CHLORIDE 20 MEQ/15ML (10%) PO SOLN
40.0000 meq | Freq: Every day | ORAL | Status: DC
  Administered 2018-06-01 – 2018-06-04 (×4): 40 meq
  Filled 2018-06-01 (×5): qty 30

## 2018-06-01 MED ORDER — NOREPINEPHRINE 4 MG/250ML-% IV SOLN
INTRAVENOUS | Status: AC
  Administered 2018-06-01: 16:00:00 via INTRAVENOUS
  Filled 2018-06-01: qty 250

## 2018-06-01 NOTE — Progress Notes (Signed)
Patient arrived to the unit from [facility]. She is vented and aroussable. Patient is currently on Precedex 18.5. Patient is tolerating vent well. She has a Foley, Foley care has been done, CHG bath completed and full Linen change has been done.  Pt. Has boggy right heel and MASD in perineum  Bilateral, under breast and abdominal fold. Rash on back and on left arm. Discoloration on  Side of left great toe.

## 2018-06-01 NOTE — Plan of Care (Signed)
Discussed with family via e-link and in front of patient plan of care for the evening, pain management and possible weaning of her ventilator tomorrow per md notes with some teach back displayed.  Found out that patient loves to spend time with her great niece and [employer].

## 2018-06-01 NOTE — Progress Notes (Signed)
[facility] attending:  We received the patient today in transfer from my partners at [facility].  This lady has a history of heart failure, COPD, was admitted on May 22, 2018 in the setting of severe community-acquired pneumonia from COVID-19.  She required intubation on April 4.  During her hospitalization she has had some degree of shock.  She has been treated with antibiotics, those are currently on hold. She has been able to do some weaning over the last several days.  Has had some diuresis, creatinine is up today, potassium is low.  On physical exam she is sedated, some crackles in bases of her lungs.  No clear edema period  She has ARDS secondary to COVID-19 pneumonia: Oxygenation has improving, continue daily weaning trials  Diabetes mellitus, continue sliding scale insulin  Mild acute kidney injury: Hold IV diuretics today, agree with the decision to administer IV fluids  Intermittent agitation: Continue Precedex for now to help manage ventilator synchrony, try to wean off tomorrow  Roselie Awkward, MD [facility] Pager: [phone] Cell: [phone] If no response, call [phone]

## 2018-06-01 NOTE — Progress Notes (Signed)
Patient became hypotensive with MAP rapidly dropping at approximately 1500. Patient bradycardic at 55. Precedex paused. RN team at bedside. CCM paged. 1 liter bolus ordered, which RN gave in 250cc increments due to CHF history. BID lasix held and beta blocker discontinued per MD team. Levophed started and now remains at 55mcg/min to maintain a MAP >65. RN will continue to attempt to wean off vasopressor. CVP now 14. Plan to wean ventilator on Precedex tomorrow. CCM rounded.

## 2018-06-01 NOTE — Progress Notes (Signed)
   06/01/18 1300  Clinical Encounter Type  Visited With Family  Visit Type Initial;Psychological support;Spiritual support  Referral From Nurse  Consult/Referral To Chaplain  Spiritual Encounters  Spiritual Needs Emotional;Other (Comment) (Spiritual Care conversation/support)  Stress Factors  Family Stress Factors Health changes;Lack of knowledge   I spoke with Mr. Standlee over the phone per referral from the Charge Nurse to let him know that Mrs. Ranum had made it to our facility. He was appreciative of the care and relieved that she made it here ok. He has question about her condition, which I will relay over to the bedside nurse.   Please, contact Spiritual Care for further assistance.  Chaplain Shanon Ace M.Div., [facility]

## 2018-06-01 NOTE — Progress Notes (Signed)
Nutrition Follow-up RD working remotely.  DOCUMENTATION CODES:   Morbid obesity  INTERVENTION:   Change TF to a standard formula:   Jevity 1.2 at 40 ml/h (960 ml per day)  Pro-stat 60 ml TID  Provides 1752 kcal, 143 gm protein, 778 ml free water daily  NUTRITION DIAGNOSIS:   Inadequate oral intake related to inability to eat as evidenced by NPO status.  Ongoing  GOAL:   Provide needs based on ASPEN/SCCM guidelines  Met with TF  MONITOR:   Vent status, TF tolerance, Labs, Skin, I & O's  ASSESSMENT:   68 yo female with PMH of obesity, DM, HTN, HLD, CKD, stage 2, stroke who was admitted with SVT, hypoxemia, COVID-19 positive. Required intubation 4/4.  Patient remains intubated on ventilator support Temp (24hrs), Avg:98.4 F (36.9 C), Min:97.6 F (36.4 C), Max:99 F (37.2 C)   Labs reviewed. Sodium 146 (H), potassium 3 (L) CBG's: [phone] Medications reviewed and include Novolog, free water 200 ml every 8 hours, KCl.  Currently receiving Vital High Protein at 70 ml/h. Stable for transition to a standard formula. RD to change TF regimen.  NUTRITION - FOCUSED PHYSICAL EXAM:  unable to complete  Diet Order:   Diet Order    None      EDUCATION NEEDS:   No education needs have been identified at this time  Skin:  Skin Assessment: Skin Integrity Issues: Skin Integrity Issues:: Other (Comment) Other: open blister L pretibial; MASD to breast, abdomen, groin  Last BM:  4/13  Height:   Ht Readings from Last 1 Encounters:  05/23/18 5' 8" (1.727 m)    Weight:   Wt Readings from Last 1 Encounters:  06/01/18 123.2 kg    Ideal Body Weight:  63.6 kg  BMI:  Body mass index is 41.3 kg/m.  Estimated Nutritional Needs:   Kcal:  2751-7001  Protein:  130-160 gm  Fluid:  1.9 L    Molli Barrows, RD, LDN, [place] Pager [phone] After Hours Pager [phone]

## 2018-06-01 NOTE — Progress Notes (Signed)
PROGRESS NOTE                                                                                                                                                                                                             Patient Demographics:    Shannon Carroll, is a 68 y.o. female, DOB - 1950-11-21, GYJ:[id number]  Outpatient Primary MD for the patient is Harlan Stains, MD    LOS - 64  Chief Complaint  Patient presents with   Shortness of Breath       Brief Narrative  69 yr old female with PMHx HFpEF EF 60%, COPD, CVA, SVT, essential hypertension, obstructive sleep apnea, morbid obesity, who presented to the hospital with acute shortness of breath was diagnosed with COVID-19 infection, went into acute hypoxic respiratory failure intubated and kept in ICU by pulmonary critical care.  Transferred to [facility] covered ICU unit on 06/01/2018 under my care intubated.   Subjective:    Marvel Plan today is intubated and sedated   Assessment  & Plan :     1. Acute Hypoxic Resp. Failure due to Acute Covid 19 Viral Illness during the ongoing 2020 Covid 19 Pandemic -needs to be intubated, has finished treatment with hydroxychloroquine, continues to require endotracheal intubation with mechanical ventilation support, ARDS clinically seems to be improving, FiO2 requirements going down currently on 30%, will start weaning protocol from tomorrow morning and monitor.  PCCM on board.  Vent Mode: PRVC FiO2 (%):  [30 %] 30 % Set Rate:  [16 bmp] 16 bmp Vt Set:  [510 mL] 510 mL PEEP:  [5 cmH20] 5 cmH20 Pressure Support:  [5 cmH20-15 cmH20] 15 cmH20 Plateau Pressure:  [20 cmH20-24 cmH20] 24 cmH20  ABG    Component Value Date/Time   PHART 7.506 (H) 06/01/2018 0225   PCO2ART 42.6 06/01/2018 0225   PO2ART 67.0 (L) 06/01/2018 0225   HCO3 33.7 (H) 06/01/2018 0225   TCO2 35 (H) 06/01/2018 0225   O2SAT 94.0 06/01/2018 0225     2.  Septic shock.  Blood cultures negative to date presumed to be secondary to COVID-19 infection, initially required pressors through left arm PICC line.  Now sepsis pathophysiology seems to have resolved.  3.  ARF.  Resolved.  4.  Iron deficiency anemia acute on chronic.  Some element of gradual blood loss due  to repeated blood draws, will check type and screen, transfuse if hemoglobin drops below 7.  No signs of overt bleeding.  Continue PPI for prophylaxis.  5.  Hypokalemia.  Replace and monitor.  6.  Morbid obesity with underlying obstructive sleep apnea.  Outpatient follow-up with PCP.  7.  Nutrition.  Tube feedings via G-tube.  Free water flushes ordered.  8.  Hypertension.  On low-dose beta-blocker.  9.  History of CVA.  Currently on Plavix, resume home dose statin.  10.  Dyslipidemia.  Home dose statin replaced.    11. DM2 - ISS  CBG (last 3)  Recent Labs    06/01/18 0404 06/01/18 0747 06/01/18 1144  GLUCAP 139* 137* 153*      Nutrition.  Tube feedings via G-tube.  Free water flushes ordered.   Condition - Extremely Guarded  Family Communication  : updated son Brain 06/01/18 @ 2pm  Code Status :  Partial with No CPR  Diet : TF  Disposition Plan  :  ICU  Consults  :  PCCM  Procedures  :    Endotracheal intubation- 05/23/2018 L.Arm PICC - 05/30/18  Blood cx 05/30/2018- NGTD Novel Coronavirus Positive result called 4/4. Tracheal aspirate 4/7>>> moderate WBC (abundant GPC and GNR)  MRSA 4/5> neg    DVT Prophylaxis  :  Lovenox    Lab Results  Component Value Date   PLT 265 06/01/2018    Inpatient Medications  Scheduled Meds:  budesonide (PULMICORT) nebulizer solution  0.25 mg Nebulization BID   chlorhexidine gluconate (MEDLINE KIT)  15 mL Mouth Rinse BID   Chlorhexidine Gluconate Cloth  6 each Topical Daily   clopidogrel  75 mg Per Tube Daily   docusate  50 mg Oral BID   enoxaparin (LOVENOX) injection  60 mg Subcutaneous Q24H    feeding supplement (JEVITY 1.2 CAL)  1,000 mL Per Tube Q24H   feeding supplement (PRO-STAT SUGAR FREE 64)  60 mL Oral TID   free water  200 mL Per Tube Q8H   insulin aspart  0-15 Units Subcutaneous Q4H   ipratropium  0.5 mg Nebulization Q6H   levalbuterol  0.63 mg Nebulization Q6H   mouth rinse  15 mL Mouth Rinse 10 times per day   metoprolol tartrate  12.5 mg Oral Q8H   pantoprazole sodium  40 mg Per Tube Daily   polyethylene glycol  17 g Oral BID   potassium chloride  40 mEq Per Tube Daily   sennosides  5 mL Oral QHS   silver sulfADIAZINE  1 application Topical Daily   sodium chloride flush  10-40 mL Intracatheter Q12H   sodium chloride flush  3 mL Intravenous Q12H   sodium chloride flush  3 mL Intravenous Q12H   venlafaxine  37.5 mg Per Tube QHS   venlafaxine  75 mg Per Tube q morning - 10a   Continuous Infusions:  dexmedetomidine (PRECEDEX) IV infusion 0.6 mcg/kg/hr (06/01/18 1209)   feeding supplement (VITAL HIGH PROTEIN) Stopped (06/01/18 1200)   PRN Meds:.acetaminophen, bisacodyl, fentaNYL (SUBLIMAZE) injection, metoprolol tartrate, midazolam, [DISCONTINUED] ondansetron **OR** ondansetron (ZOFRAN) IV, Resource ThickenUp Clear  Antibiotics  :    Anti-infectives (From admission, onward)   Start     Dose/Rate Route Frequency Ordered Stop   05/27/18 2200  ceFEPIme (MAXIPIME) 2 g in sodium chloride 0.9 % 100 mL IVPB  Status:  Discontinued     2 g 200 mL/hr over 30 Minutes Intravenous Every 8 hours 05/27/18 1322 05/27/18 1400   05/27/18 1200  ceFEPIme (MAXIPIME) 2 g in sodium chloride 0.9 % 100 mL IVPB     2 g 200 mL/hr over 30 Minutes Intravenous  Once 05/27/18 1153 05/27/18 1330   05/26/18 2200  azithromycin (ZITHROMAX) tablet 500 mg  Status:  Discontinued     500 mg Oral Daily 05/26/18 0715 05/26/18 1502   05/26/18 2200  azithromycin (ZITHROMAX) tablet 500 mg     500 mg Per Tube Daily 05/26/18 1502 05/27/18 2141   05/24/18 2200  hydroxychloroquine  (PLAQUENIL) tablet 200 mg     200 mg Oral 2 times daily 05/23/18 2113 05/28/18 1025   05/23/18 2300  azithromycin (ZITHROMAX) 500 mg in sodium chloride 0.9 % 250 mL IVPB  Status:  Discontinued     500 mg 250 mL/hr over 60 Minutes Intravenous Every 24 hours 05/23/18 2250 05/26/18 0715   05/23/18 2200  hydroxychloroquine (PLAQUENIL) tablet 400 mg     400 mg Oral 2 times daily 05/23/18 2113 05/24/18 1114   05/23/18 1500  vancomycin (VANCOCIN) 1,250 mg in sodium chloride 0.9 % 250 mL IVPB  Status:  Discontinued     1,250 mg 166.7 mL/hr over 90 Minutes Intravenous Every 24 hours 06/05/2018 1532 05/25/18 1141   05/23/18 0230  ceFEPIme (MAXIPIME) 2 g in sodium chloride 0.9 % 100 mL IVPB  Status:  Discontinued     2 g 200 mL/hr over 30 Minutes Intravenous Every 12 hours 05/23/2018 1532 05/25/18 1334   06/01/2018 1545  metroNIDAZOLE (FLAGYL) IVPB 500 mg     500 mg 100 mL/hr over 60 Minutes Intravenous  Once 06/08/2018 1531 06/04/2018 1731   06/17/2018 1530  vancomycin (VANCOCIN) IVPB 1000 mg/200 mL premix     1,000 mg 200 mL/hr over 60 Minutes Intravenous  Once 05/20/2018 1444 05/25/2018 1731   06/01/2018 1415  vancomycin (VANCOCIN) IVPB 1000 mg/200 mL premix     1,000 mg 200 mL/hr over 60 Minutes Intravenous  Once 06/04/2018 1401 05/24/2018 1538   06/09/2018 1415  ceFEPIme (MAXIPIME) 2 g in sodium chloride 0.9 % 100 mL IVPB     2 g 200 mL/hr over 30 Minutes Intravenous  Once 06/17/2018 1401 05/26/2018 1509       Time Spent in minutes  30   Lala Lund M.D on 06/01/2018 at 1:45 PM  To page go to [email or web address] - password [email or web address]  [facility] -  Office  [phone]  Total Critical Care time in examining the patient bedside, evaluating Lab work and other data, over half of the total time was spent in coordinating patient care on the floor or bedisde in talking to patient/family members, communicating with nursing Staff on the floor and sub specialists PCCM  to coordinate patients medical care and needs is  35  Minutes.   The condition which has caused critical injury/acute impairment of Pulmonary vital organ system with a high probability of sudden clinically significant deterioration and can cause Potential Life threatening injury to this patient addressed today is  Acute Hypoxic Resp failure.  See all Orders from today for further details   Admit date - 06/18/2018    10    Objective:   Vitals:   06/01/18 1130 06/01/18 1200 06/01/18 1300 06/01/18 1312  BP: (!) 83/72 102/85  (!) 120/100  Pulse: 80 80 88 87  Resp: 16 16 (!) 25 (!) 22  Temp: 98.3 F (36.8 C)  98.1 F (36.7 C)   TempSrc: Oral  Oral   SpO2: 97% 100% 94% 94%  Weight:  Height:        Wt Readings from Last 3 Encounters:  06/01/18 123.2 kg  05/19/18 131.9 kg  05/04/18 127 kg     Intake/Output Summary (Last 24 hours) at 06/01/2018 1345 Last data filed at 06/01/2018 1200 Gross per 24 hour  Intake 4260 ml  Output 3385 ml  Net 875 ml     Physical Exam  Intubated, mildly sedated, opens eyes to verbal commands, moves all 4 extremities to painful stimuli,  ETT,OG, Foley, L arm PICC [place].AT,PERRAL Supple Neck,No JVD, No cervical lymphadenopathy appriciated.  Symmetrical Chest wall movement, Good air movement bilaterally, CTAB RRR,No Gallops,Rubs or new Murmurs, No Parasternal Heave +ve B.Sounds, Abd Soft, No tenderness, No organomegaly appriciated, No rebound - guarding or rigidity. No Cyanosis, Clubbing or edema, No new Rash or bruise       Data Review:    CBC Recent Labs  Lab 05/26/18 0732 05/27/18 0636 05/28/18 0338 05/29/18 1517 05/30/18 0232 05/30/18 0327 05/31/18 0521 06/01/18 0225 06/01/18 0406  WBC 10.2 8.1 7.9 8.8  --  7.9 7.9  --  8.3  HGB 8.6* 7.9* 8.2* 9.0* 9.2* 8.2* 7.4* 9.5* 7.5*  HCT 31.1* 29.2* 28.6* 32.2* 27.0* 29.8* 27.2* 28.0* 26.5*  PLT 254 212 247 251  --  278 277  --  265  MCV 88.6 89.8 88.8 90.2  --  88.4 91.3  --  89.8  MCH 24.5* 24.3* 25.5* 25.2*  --  24.3* 24.8*  --  25.4*   MCHC 27.7* 27.1* 28.7* 28.0*  --  27.5* 27.2*  --  28.3*  RDW 18.2* 18.4* 18.5* 19.3*  --  19.2* 19.7*  --  20.4*  LYMPHSABS 0.9 0.3*  --   --   --   --   --   --   --   MONOABS 0.2 0.2  --   --   --   --   --   --   --   EOSABS 0.3 0.0  --   --   --   --   --   --   --   BASOSABS 0.0 0.0  --   --   --   --   --   --   --     Chemistries  Recent Labs  Lab 05/26/18 0732 05/27/18 0636 05/28/18 0338 05/29/18 1053 05/30/18 0232 05/30/18 0327 05/31/18 0521 06/01/18 0225 06/01/18 0406  NA 146* 144 145 143 144 144 145 145 146*  K 3.8 4.2 3.8 4.3 3.4* 3.5 3.4* 3.3* 3.0*  CL 100 99 99 96*  --  98 100  --  101  CO2 37* 36* 36* 34*  --  34* 35*  --  32  GLUCOSE 139* 136* 121* 191*  --  125* 111*  --  161*  BUN 27* 34* 32* 36*  --  34* 32*  --  32*  CREATININE 1.13* 1.07* 1.01* 1.08*  --  0.87 0.87  --  1.04*  CALCIUM 8.2* 8.4* 8.4* 8.6*  --  8.2* 8.2*  --  8.4*  MG 2.2  --  2.2 2.4  --   --   --   --   --   AST 49* 43*  --   --   --  37 31  --  31  ALT 35 29  --   --   --  21 21  --  21  ALKPHOS 81 76  --   --   --  87  93  --  96  BILITOT 1.4* 1.5*  --   --   --  1.3* 1.0  --  1.2   ------------------------------------------------------------------------------------------------------------------ No results for input(s): CHOL, HDL, LDLCALC, TRIG, CHOLHDL, LDLDIRECT in the last 72 hours.  Lab Results  Component Value Date   HGBA1C 5.2 03/02/2018   ------------------------------------------------------------------------------------------------------------------ No results for input(s): TSH, T4TOTAL, T3FREE, THYROIDAB in the last 72 hours.  Invalid input(s): FREET3 ------------------------------------------------------------------------------------------------------------------ Recent Labs    05/30/18 0327  FERRITIN 347*    Coagulation profile No results for input(s): INR, PROTIME in the last 168 hours.  No results for input(s): DDIMER in the last 72 hours.  Cardiac  Enzymes Recent Labs  Lab 05/30/18 0327 05/31/18 0521 06/01/18 0406  TROPONINI 0.03* 0.03* <0.03   ------------------------------------------------------------------------------------------------------------------    Component Value Date/Time   BNP 2,117.1 (H) 06/02/2018 1402    Micro Results Recent Results (from the past 240 hour(s))  Blood Culture (routine x 2)     Status: None   Collection Time: 05/23/2018  2:00 PM  Result Value Ref Range Status   Specimen Description BLOOD BLOOD RIGHT HAND  Final   Special Requests   Final    BOTTLES DRAWN AEROBIC AND ANAEROBIC Blood Culture adequate volume   Culture   Final    NO GROWTH 5 DAYS Performed at [facility], [place] [address], [place], [place] [address]    Report Status 05/27/2018 FINAL  Final  Blood Culture (routine x 2)     Status: None   Collection Time: 06/13/2018  2:05 PM  Result Value Ref Range Status   Specimen Description BLOOD BLOOD RIGHT HAND  Final   Special Requests   Final    BOTTLES DRAWN AEROBIC AND ANAEROBIC Blood Culture adequate volume   Culture   Final    NO GROWTH 5 DAYS Performed at [facility], [place] [address], [place], [place] [address]    Report Status 05/27/2018 FINAL  Final  Novel Coronavirus, NAA (hospital order; send-out to ref lab)     Status: Abnormal   Collection Time: 06/05/2018  5:53 PM  Result Value Ref Range Status   SARS-CoV-2, NAA DETECTED (A) NOT DETECTED Final    Comment: Performed at [facility] CRITICAL RESULT CALLED TO, READ BACK BY AND VERIFIED WITH: DR. Baxter Flattery 05/24/2018    Coronavirus Source NASOPHARYNGEAL  Final    Comment: Performed at [facility], [place] [address] [place], [place] [address]  MRSA PCR Screening     Status: None   Collection Time: 05/24/18 12:51 AM  Result Value Ref Range Status   MRSA by PCR NEGATIVE NEGATIVE Final    Comment:        The GeneXpert MRSA Assay (FDA approved for NASAL specimens only), is one component of a comprehensive  MRSA colonization surveillance program. It is not intended to diagnose MRSA infection nor to guide or monitor treatment for MRSA infections. Performed at [facility], [place] [address], [place],  [address]   Culture, respiratory (non-expectorated)     Status: None   Collection Time: 05/26/18  9:35 AM  Result Value Ref Range Status   Specimen Description TRACHEAL ASPIRATE  Final   Special Requests NONE  Final   Gram Stain   Final    MODERATE WBC PRESENT,BOTH PMN AND MONONUCLEAR FEW SQUAMOUS EPITHELIAL CELLS PRESENT ABUNDANT GRAM POSITIVE COCCI ABUNDANT GRAM NEGATIVE RODS FEW GRAM POSITIVE RODS    Culture   Final  MODERATE Consistent with normal respiratory flora. Performed at [facility], [place] [address] [place], [place] [address]    Report Status 05/28/2018 FINAL  Final    Radiology Reports Ct Chest Wo Contrast  Result Date: 06/04/2018 CLINICAL DATA:  68 year old female with shortness of breath, fever. Patient also has a history of congestive heart failure. EXAM: CT CHEST WITHOUT CONTRAST TECHNIQUE: Multidetector CT imaging of the chest was performed following the standard protocol without IV contrast. COMPARISON:  Chest x-ray obtained earlier today; prior chest x-ray 05/19/18 FINDINGS: Cardiovascular: Limited evaluation in the absence of intravenous contrast. Cardiomegaly. Calcification of the mitral valve annulus. Coronary artery calcifications also present. Atherosclerotic calcifications throughout the aorta. No pericardial effusion. Mediastinum/Nodes: Thyroid gland is unremarkable. Borderline enlarged mediastinal lymph nodes. Index right paratracheal lymph node measures 1.2 cm in short axis. Index low right paratracheal lymph node measures 1.2 cm in short axis. Unremarkable thoracic esophagus. Lungs/Pleura: Extensive patchy ground-glass and more consolidative airspace opacities in a peripheral and peribronchovascular distribution throughout all lobes of both lungs  but preferentially within the upper lobes. There is relative sparing of the middle lobe. Somewhat less significant disease within the dependent portions of the right lower lobe makes aspiration less likely. Additionally, there is mild bilateral lower lobe atelectasis secondary to the presence of small bilateral pleural effusions. Upper Abdomen: Small volume perihepatic ascites. Relative right renal atrophy. Musculoskeletal: No acute fracture or aggressive appearing lytic or blastic osseous lesion. IMPRESSION: 1. There are a spectrum of findings in the lungs which can be seen with acute atypical infection (as well as other non-infectious etiologies). In particular, viral pneumonia (including COVID-19) should be considered in the appropriate clinical setting. Given the upper lung predominant pattern of distribution, aspiration is considered significantly less likely despite the patient's underlying risk factors. Similarly, congestive heart failure is considered less likely given the absence of interlobular septal thickening. 2. Small to moderate bilateral layering pleural effusions. 3. Small volume perihepatic site ease of uncertain etiology. 4. Borderline enlarged mediastinal lymph nodes are favored to be reactive. 5. Coronary artery calcifications. 6.  Aortic Atherosclerosis (ICD10-170.0). 7. Relative right renal atrophy. These results were called by telephone at the time of interpretation on 06/12/2018 at 4:42 pm to Dr. Evangeline Gula, who verbally acknowledged these results. Electronically Signed   By: Jacqulynn Cadet M.D.   On: 06/11/2018 16:43   Dg Chest Port 1 View  Result Date: 05/30/2018 CLINICAL DATA:  ARDS EXAM: PORTABLE CHEST 1 VIEW COMPARISON:  05/28/2018 FINDINGS: Endotracheal tube in good position.  NG tube in the stomach. Cardiac enlargement. Extensive diffuse bilateral airspace disease unchanged. Bilateral pleural effusions and bibasilar atelectasis unchanged. IMPRESSION: Diffuse bilateral airspace  disease and bilateral effusions unchanged. Electronically Signed   By: Franchot Gallo M.D.   On: 05/30/2018 08:14   Dg Chest Port 1 View  Result Date: 05/28/2018 CLINICAL DATA:  68 year old female with ARDS EXAM: PORTABLE CHEST 1 VIEW COMPARISON:  Multiple prior most recent 05/27/2018, 05/23/2018, CT 06/17/2018 FINDINGS: Cardiomediastinal silhouette unchanged in size and contour, with cardiomegaly. Endotracheal tube unchanged terminating 2.7 cm above the carina. Unchanged gastric tube terminating out of the field of view. Low lung volumes with veil opacities at the bilateral lung bases obscuring the hemidiaphragm. Patchy airspace and interstitial opacities bilaterally, similar to the comparison. No pneumothorax. IMPRESSION: Unchanged endotracheal tube and gastric tube. Similar appearance of mixed interstitial and airspace opacities of the bilateral lungs, with bibasilar pleural effusions. Electronically Signed   By: Corrie Mckusick D.O.  On: 05/28/2018 07:53   Dg Chest Port 1 View  Result Date: 05/27/2018 CLINICAL DATA:  68 year old female with respiratory failure EXAM: PORTABLE CHEST 1 VIEW COMPARISON:  05/23/2018, 06/17/2018, 05/19/2018 FINDINGS: Cardiomediastinal silhouette unchanged in size and contour. Endotracheal tube terminates 2.7 cm above the carina. Unchanged gastric tube, with the tip terminating just below the diaphragm. No pneumothorax. Worsening airspace opacity in the left lung and right mid lung with veil opacity at the lung bases obscuring the hemidiaphragms and heart borders. Interval removal of the defibrillator pads. IMPRESSION: Unchanged endotracheal tube and gastric tube. Persisting interstitial and airspace opacity of the lungs, worsening on the left. Small bilateral pleural effusions and associated atelectasis/consolidation. Electronically Signed   By: Corrie Mckusick D.O.   On: 05/27/2018 08:02   Dg Chest Port 1 View  Result Date: 05/23/2018 CLINICAL DATA:  Intubation EXAM: PORTABLE  CHEST 1 VIEW COMPARISON:  05/21/2018 FINDINGS: Endotracheal tube tip is 3 cm above the carina. Orogastric or nasogastric tube enters the abdomen. The tip may be just beyond the gastroesophageal junction. Widespread bilateral pulmonary infiltrates persist. No worsening or new finding by radiography. IMPRESSION: Endotracheal tube 3 cm above the carina. Orogastric or nasogastric tube enters the abdomen, just beyond the gastroesophageal junction. Widespread bilateral infiltrates persist. Electronically Signed   By: Nelson Chimes M.D.   On: 05/23/2018 21:38   Dg Chest Port 1 View  Result Date: 06/10/2018 CLINICAL DATA:  Shortness of breath since yesterday. Tachycardia. Former smoker. EXAM: PORTABLE CHEST 1 VIEW COMPARISON:  05/19/2018 FINDINGS: Lungs are adequately inflated and demonstrate worsening of bilateral multifocal airspace opacification which is most prominent over the right upper lobe and left perihilar region. Possible small amount left pleural fluid unchanged. Mild stable cardiomegaly. Remainder the exam is unchanged. IMPRESSION: Interval worsening bilateral multifocal airspace process most prominent over the right upper lobe and left perihilar region likely multifocal pneumonia. Small amount left pleural fluid unchanged. Mild stable cardiomegaly. Electronically Signed   By: Marin Olp M.D.   On: 06/03/2018 14:29   Dg Chest Port 1 View  Result Date: 05/19/2018 CLINICAL DATA:  Respiratory failure and congestive heart failure. EXAM: PORTABLE CHEST 1 VIEW COMPARISON:  05/18/2018 FINDINGS: Stable cardiac enlargement. Slight diminishment in CHF since the prior study. There is some asymmetric vascular congestion versus atelectasis in the right upper lung. Stable left basilar atelectasis and probable component of small to moderate left pleural effusion. IMPRESSION: Decreased prominence of CHF since the prior chest x-ray. Asymmetric vascular congestion versus atelectasis in the right upper lung. Left basilar  atelectasis with probable component of left pleural fluid. Electronically Signed   By: Aletta Edouard M.D.   On: 05/19/2018 08:27   Dg Chest Port 1 View  Result Date: 05/18/2018 CLINICAL DATA:  Short of breath EXAM: PORTABLE CHEST 1 VIEW COMPARISON:  05/17/2018 FINDINGS: Stable enlarged cardiac silhouette. Bilateral pleural effusions similar prior. A central venous congestion. No focal consolidation. No pneumothorax. IMPRESSION: Cardiomegaly with small bilateral pleural effusions. Concern for mild congestive heart failure Electronically Signed   By: Suzy Bouchard M.D.   On: 05/18/2018 08:16   Dg Chest Port 1 View  Result Date: 05/17/2018 CLINICAL DATA:  Shortness of breath EXAM: PORTABLE CHEST 1 VIEW COMPARISON:  05/16/2018 FINDINGS: Cardiomegaly with mild perihilar edema and small bilateral pleural effusions, grossly unchanged. No pneumothorax. IMPRESSION: Cardiomegaly mild interstitial edema and small bilateral pleural effusions, grossly unchanged. Electronically Signed   By: Julian Hy M.D.   On: 05/17/2018 08:11   Dg Chest [facility]  1 View  Result Date: 05/16/2018 CLINICAL DATA:  Shortness of breath EXAM: PORTABLE CHEST 1 VIEW COMPARISON:  05/15/2018 FINDINGS: Cardiomegaly with mild perihilar edema. Small to moderate bilateral pleural effusions, increased. No pneumothorax. IMPRESSION: Cardiomegaly with mild perihilar edema and small to moderate bilateral pleural effusions, increased. Electronically Signed   By: Julian Hy M.D.   On: 05/16/2018 07:41   Dg Chest Port 1 View  Result Date: 05/15/2018 CLINICAL DATA:  Shortness of breath EXAM: PORTABLE CHEST 1 VIEW COMPARISON:  May 14, 2018 FINDINGS: There is persistent cardiomegaly. Pulmonary vascularity is within normal limits. Currently there is no appreciable edema or consolidation. No adenopathy. No bone lesions. There is aortic atherosclerosis. IMPRESSION: Cardiomegaly. No edema or consolidation. Pulmonary vascularity within normal  limits. Aortic Atherosclerosis (ICD10-I70.0). Electronically Signed   By: Lowella Grip III M.D.   On: 05/15/2018 07:35   Dg Chest Port 1 View  Result Date: 05/14/2018 CLINICAL DATA:  Aspiration EXAM: PORTABLE CHEST 1 VIEW COMPARISON:  May 14, 2018 study obtained earlier in the day FINDINGS: There is cardiomegaly with mild pulmonary venous hypertension. There are pleural effusions bilaterally. There is no appreciable edema or consolidation. There is aortic atherosclerosis. No adenopathy. No bone lesions. IMPRESSION: Pulmonary vascular congestion with small pleural effusions bilaterally. No frank edema or consolidation. Aortic Atherosclerosis (ICD10-I70.0). Electronically Signed   By: Lowella Grip III M.D.   On: 05/14/2018 16:00   Dg Chest Port 1 View  Result Date: 05/14/2018 CLINICAL DATA:  Short of breath EXAM: PORTABLE CHEST 1 VIEW COMPARISON:  05/06/2018 FINDINGS: Cardiac enlargement with mild progression of vascular congestion. Progression of bibasilar atelectasis and small effusions. Atherosclerotic ascending aorta and aortic arch. IMPRESSION: Congestive heart failure with mild progression. Electronically Signed   By: Franchot Gallo M.D.   On: 05/14/2018 08:31   Dg Chest Port 1 View  Result Date: 05/06/2018 CLINICAL DATA:  Dyspnea. Bilateral leg swelling. EXAM: PORTABLE CHEST 1 VIEW COMPARISON:  01/21/2018. FINDINGS: Poor inspiration. No gross change in borderline enlargement of the cardiac silhouette and mild prominence of the pulmonary vasculature and interstitial markings. No visible pleural fluid. Thoracic spine degenerative changes. IMPRESSION: Stable borderline cardiomegaly, mild pulmonary vascular congestion and mild chronic interstitial lung disease. Electronically Signed   By: Claudie Revering M.D.   On: 05/06/2018 18:07   [place] Ekg Site Rite  Result Date: 05/30/2018 If Site Rite image not attached, placement could not be confirmed due to current cardiac rhythm.  [place] Ekg Site  Rite  Result Date: 05/30/2018 If Site Rite image not attached, placement could not be confirmed due to current cardiac rhythm.  US Abdomen Limited Ruq  Result Date: 05/14/2018 CLINICAL DATA:  Elevated LFTs. EXAM: ULTRASOUND ABDOMEN LIMITED RIGHT UPPER QUADRANT COMPARISON:  None. FINDINGS: Gallbladder: Physiologically distended. No gallstones. No significant gallbladder wall thickening. No sonographic Murphy sign noted by sonographer. Common bile duct: Diameter: 5 mm. Liver: No focal lesion identified. Diffusely heterogeneous and slightly increased in parenchymal echogenicity. Portal vein is patent on color Doppler imaging with normal direction of blood flow towards the liver. Small amount of right upper quadrant ascites. Right pleural effusion noted. IMPRESSION: 1. Mild hepatic steatosis. 2. No gallstones.  No biliary dilatation. 3. Small volume of ascites.  Right pleural effusion. Electronically Signed   By: Keith Rake M.D.   On: 05/14/2018 22:18

## 2018-06-01 NOTE — Progress Notes (Signed)
[facility] ADULT ICU REPLACEMENT PROTOCOL FOR AM LAB REPLACEMENT ONLY  The patient does apply for the [facility] Adult ICU Electrolyte Replacment Protocol based on the criteria listed below:   1. Is GFR >/= 40 ml/min? Yes.    Patient's GFR today is >56 2. Is urine output >/= 0.5 ml/kg/hr for the last 6 hours? Yes.   Patient's UOP is 1.21 ml/kg/hr 3. Is BUN < 60 mg/dL? Yes.    Patient's BUN today is 32 4. Abnormal electrolyte(s):Potassium 3 5. Ordered repletion with: Potassium per protocol 6. If a panic level lab has been reported, has the CCM MD in charge been notified? No..   Physician:    Adam Phenix 06/01/2018 6:47 AM

## 2018-06-01 NOTE — Progress Notes (Signed)
Assisted tele visit to patient with son.  Shannon Vi Parker, RN  

## 2018-06-01 NOTE — Progress Notes (Signed)
NAME:  Shannon Carroll, MRN:  [id number], DOB:  January 12, 1951, LOS: 57 ADMISSION DATE:  06/02/2018, CONSULTATION DATE:  05/23/2018 REFERRING MD:  Glenna Durand, CHIEF COMPLAINT:  SVT hpoxemia, COVID 28 POS   Brief History   68 yr old female with PMHx HFpEF, COPD, w/ bilateral infiltrates COVID positive on supplemental oxygen 8L with increased work of breathing, fatigue, and diaphoretic. PCCM consulted for acute respiratory distress.  Significant Hospital Events   4/3 + COVID -19  4/4 ICU admit intubated  4/12 weaning trial, failed, de-sat, SVT   Consults:  PCCM   Procedures:  Endotracheal intubation- 05/23/2018  Significant Diagnostic Tests:  Bilateral alveolar infiltrates on CXR -CXR 4/8 with worsening LLL and RML opacity   Micro Data:  Blood cx 06/14/2018- NGTD Novel Coronavirus Positive result called 4/4. Tracheal aspirate 4/7>>> moderate WBC (abundant GPC and GNR)  MRSA 4/5> neg   Antimicrobials:  Vancomycin- 05/23/2018 Cefepime- 05/23/2018-4/6, 4/8>>  Hydroxychloroquine- upon transfer to ICU -COVID (to end 4/9) Axithromycin - upon transfer to ICU- COVID (to end 4/8)  Interim history/subjective:  Doing well this am. Responded to additional diuresis. Tolerating SBT this morning.   Objective   Blood pressure 105/79, pulse 84, temperature 98.3 F (36.8 C), temperature source Oral, resp. rate 20, height 5\' 8"  (1.727 m), weight 123.2 kg, SpO2 98 %.    Vent Mode: CPAP;PSV FiO2 (%):  [30 %-40 %] 30 % Set Rate:  [16 bmp] 16 bmp Vt Set:  [510 mL] 510 mL PEEP:  [5 cmH20] 5 cmH20 Pressure Support:  [5 cmH20] 5 cmH20 Plateau Pressure:  [15 cmH20-20 cmH20] 20 cmH20   Intake/Output Summary (Last 24 hours) at 06/01/2018 0919 Last data filed at 06/01/2018 0900 Gross per 24 hour  Intake 3800 ml  Output 3225 ml  Net 575 ml   Filed Weights   05/30/18 0345 05/31/18 0500 06/01/18 0500  Weight: 122.2 kg 124.1 kg 123.2 kg    Examination:  Gen:   Intubated, comfortable on vent HEENT:  NCAT,  tracking appropriately  Neck:     Trachea midline  Lungs:    BL vented breath sounds  CV:       Regular, s1 s2  Abd:   +BS, NT ND  Ext:  no edema  Skin:     No rash  Neuro:  Sedated some, comfortable, able to follow commands   Resolved Hospital Problem list   Transaminits.  Septic shock  Assessment & Plan:   Acute Hypoxic Respiratory Failure on mechanical ventilation  -ARDS secondary to  COVID-19 viral pneumonia -R/o bacterial co-infection, cx with normal flora  Slow wean at this point  Diuresis to maintain euvolemia  Slow titration of PS down as tolerated Failed sbt yesterday, de-saturated yesterday, SVT 160s  Stable for transport to biocontainment facility at [place] septic secondary to COVID-19, resolved  - off pressors  - continue to observe  Diabetes mellitus - CBGS with SSI   AKI on CKD, Positive CFB+  PRD diuresis to maintain euvolemia  Clinically she looks better no exam from an edema standpoint   Constipation BM regimen in place   Hx Depression Continue home effexor   SVT  Has had paroxsymal SVT She responds to BB(-)  Correcting lytes again today   Hypokalemia Replete again today   Anemia  No sign of acute bleeding  Will need to observe   Stable for transport to [place]. I have discussed this with Dr. Lake Bells.    Best practice:  Diet:tube  feed Pain/Anxiety/Delirium protocol (if indicated): Continuous infusion VAP protocol (if indicated): NA DVT prophylaxis: SCD, Lovenox GI prophylaxis: Protonix Glucose control: sliding scale Mobility: bed bound Code Status: Partial code - REMAINS NO CPR, but if WE REMOVED TUBE WOULD BE OK IF WE PUT BACK ETT Family Communication: I spoke with patients son Disposition: ICU   Labs   CBC: Recent Labs  Lab 05/26/18 0732 05/27/18 0636 05/28/18 2841 05/29/18 1517 05/30/18 0232 05/30/18 0327 05/31/18 0521 06/01/18 0225 06/01/18 0406  WBC 10.2 8.1 7.9 8.8  --  7.9 7.9  --  8.3  NEUTROABS 8.7* 7.5  --    --   --   --   --   --   --   HGB 8.6* 7.9* 8.2* 9.0* 9.2* 8.2* 7.4* 9.5* 7.5*  HCT 31.1* 29.2* 28.6* 32.2* 27.0* 29.8* 27.2* 28.0* 26.5*  MCV 88.6 89.8 88.8 90.2  --  88.4 91.3  --  89.8  PLT 254 212 247 251  --  278 277  --  324    Basic Metabolic Panel: Recent Labs  Lab 05/26/18 0732  05/28/18 0338 05/29/18 1053 05/30/18 0232 05/30/18 0327 05/31/18 0521 06/01/18 0225 06/01/18 0406  NA 146*   < > 145 143 144 144 145 145 146*  K 3.8   < > 3.8 4.3 3.4* 3.5 3.4* 3.3* 3.0*  CL 100   < > 99 96*  --  98 100  --  101  CO2 37*   < > 36* 34*  --  34* 35*  --  32  GLUCOSE 139*   < > 121* 191*  --  125* 111*  --  161*  BUN 27*   < > 32* 36*  --  34* 32*  --  32*  CREATININE 1.13*   < > 1.01* 1.08*  --  0.87 0.87  --  1.04*  CALCIUM 8.2*   < > 8.4* 8.6*  --  8.2* 8.2*  --  8.4*  MG 2.2  --  2.2 2.4  --   --   --   --   --   PHOS 2.5  --   --  4.4  --   --   --   --   --    < > = values in this interval not displayed.   GFR: Estimated Creatinine Clearance: 72.6 mL/min (A) (by C-G formula based on SCr of 1.04 mg/dL (H)). Recent Labs  Lab 05/27/18 0636  05/29/18 1517 05/30/18 0327 05/31/18 0521 05/31/18 0523 06/01/18 0406  PROCALCITON 0.12  --   --   --   --   --   --   WBC 8.1   < > 8.8 7.9 7.9  --  8.3  LATICACIDVEN  --   --   --  1.1  --  0.8 1.0   < > = values in this interval not displayed.    Liver Function Tests: Recent Labs  Lab 05/26/18 0732 05/27/18 0636 05/30/18 0327 05/31/18 0521 06/01/18 0406  AST 49* 43* 37 31 31  ALT 35 29 21 21 21   ALKPHOS 81 76 87 93 96  BILITOT 1.4* 1.5* 1.3* 1.0 1.2  PROT 6.7 6.6 6.5 5.8* 6.0*  ALBUMIN 2.2* 2.1* 2.0* 1.8* 2.0*   No results for input(s): LIPASE, AMYLASE in the last 168 hours. No results for input(s): AMMONIA in the last 168 hours.  ABG    Component Value Date/Time   PHART 7.506 (H) 06/01/2018 0225  PCO2ART 42.6 06/01/2018 0225   PO2ART 67.0 (L) 06/01/2018 0225   HCO3 33.7 (H) 06/01/2018 0225   TCO2 35 (H)  06/01/2018 0225   O2SAT 94.0 06/01/2018 0225     Coagulation Profile: No results for input(s): INR, PROTIME in the last 168 hours.  Cardiac Enzymes: Recent Labs  Lab 05/26/18 0732 05/27/18 0636 05/30/18 0327 05/31/18 0521 06/01/18 0406  CKTOTAL 15* 17*  --   --   --   TROPONINI  --   --  0.03* 0.03* <0.03    HbA1C: Hgb A1c MFr Bld  Date/Time Value Ref Range Status  03/02/2018 04:43 PM 5.2 4.8 - 5.6 % Final    Comment:             Prediabetes: 5.7 - 6.4          Diabetes: >6.4          Glycemic control for adults with diabetes: <7.0     CBG: Recent Labs  Lab 05/31/18 1613 05/31/18 1942 06/01/18 0009 06/01/18 0404 06/01/18 0747  GLUCAP 160* 120* 118* 139* 137*    This patient is critically ill with multiple organ system failure; which, requires frequent high complexity decision making, assessment, support, evaluation, and titration of therapies. This was completed through the application of advanced monitoring technologies and extensive interpretation of multiple databases. During this encounter critical care time was devoted to patient care services described in this note for 36 minutes.   Garner Nash, DO [facility] 06/01/2018 9:19 AM  Personal pager: [phone] If unanswered, please page CCM On-call: [phone]

## 2018-06-02 ENCOUNTER — Inpatient Hospital Stay (HOSPITAL_COMMUNITY): Payer: Medicare Other

## 2018-06-02 LAB — GLUCOSE, CAPILLARY
Glucose-Capillary: 108 mg/dL — ABNORMAL HIGH (ref 70–99)
Glucose-Capillary: 126 mg/dL — ABNORMAL HIGH (ref 70–99)
Glucose-Capillary: 127 mg/dL — ABNORMAL HIGH (ref 70–99)
Glucose-Capillary: 133 mg/dL — ABNORMAL HIGH (ref 70–99)
Glucose-Capillary: 135 mg/dL — ABNORMAL HIGH (ref 70–99)
Glucose-Capillary: 140 mg/dL — ABNORMAL HIGH (ref 70–99)
Glucose-Capillary: 149 mg/dL — ABNORMAL HIGH (ref 70–99)

## 2018-06-02 LAB — CBC WITH DIFFERENTIAL/PLATELET
Abs Immature Granulocytes: 0.05 10*3/uL (ref 0.00–0.07)
Basophils Absolute: 0 10*3/uL (ref 0.0–0.1)
Basophils Relative: 0 %
Eosinophils Absolute: 0.1 10*3/uL (ref 0.0–0.5)
Eosinophils Relative: 1 %
HCT: 26.9 % — ABNORMAL LOW (ref 36.0–46.0)
Hemoglobin: 7.4 g/dL — ABNORMAL LOW (ref 12.0–15.0)
Immature Granulocytes: 1 %
Lymphocytes Relative: 15 %
Lymphs Abs: 1.1 10*3/uL (ref 0.7–4.0)
MCH: 25.3 pg — ABNORMAL LOW (ref 26.0–34.0)
MCHC: 27.5 g/dL — ABNORMAL LOW (ref 30.0–36.0)
MCV: 92.1 fL (ref 80.0–100.0)
Monocytes Absolute: 0.3 10*3/uL (ref 0.1–1.0)
Monocytes Relative: 4 %
Neutro Abs: 5.8 10*3/uL (ref 1.7–7.7)
Neutrophils Relative %: 79 %
Platelets: 264 10*3/uL (ref 150–400)
RBC: 2.92 MIL/uL — ABNORMAL LOW (ref 3.87–5.11)
RDW: 20.8 % — ABNORMAL HIGH (ref 11.5–15.5)
WBC: 7.3 10*3/uL (ref 4.0–10.5)
nRBC: 0.3 % — ABNORMAL HIGH (ref 0.0–0.2)

## 2018-06-02 LAB — COMPREHENSIVE METABOLIC PANEL
ALT: 20 U/L (ref 0–44)
AST: 31 U/L (ref 15–41)
Albumin: 2.3 g/dL — ABNORMAL LOW (ref 3.5–5.0)
Alkaline Phosphatase: 135 U/L — ABNORMAL HIGH (ref 38–126)
Anion gap: 7 (ref 5–15)
BUN: 44 mg/dL — ABNORMAL HIGH (ref 8–23)
CO2: 31 mmol/L (ref 22–32)
Calcium: 8 mg/dL — ABNORMAL LOW (ref 8.9–10.3)
Chloride: 107 mmol/L (ref 98–111)
Creatinine, Ser: 0.97 mg/dL (ref 0.44–1.00)
GFR calc Af Amer: >60 mL/min (ref >60)
GFR calc non Af Amer: >60 mL/min (ref >60)
Glucose, Bld: 131 mg/dL — ABNORMAL HIGH (ref 70–99)
Potassium: 4.3 mmol/L (ref 3.5–5.1)
Sodium: 145 mmol/L (ref 135–145)
Total Bilirubin: 1.3 mg/dL — ABNORMAL HIGH (ref 0.3–1.2)
Total Protein: 6.4 g/dL — ABNORMAL LOW (ref 6.5–8.1)

## 2018-06-02 LAB — MAGNESIUM: Magnesium: 2.5 mg/dL — ABNORMAL HIGH (ref 1.7–2.4)

## 2018-06-02 LAB — POCT I-STAT 7, (LYTES, BLD GAS, ICA,H+H)
Acid-Base Excess: 7 mmol/L — ABNORMAL HIGH (ref 0.0–2.0)
Bicarbonate: 31.8 mmol/L — ABNORMAL HIGH (ref 20.0–28.0)
Calcium, Ion: 1.16 mmol/L (ref 1.15–1.40)
HCT: 34 % — ABNORMAL LOW (ref 36.0–46.0)
Hemoglobin: 11.6 g/dL — ABNORMAL LOW (ref 12.0–15.0)
O2 Saturation: 99 %
Patient temperature: 98.6
Potassium: 4.3 mmol/L (ref 3.5–5.1)
Sodium: 145 mmol/L (ref 135–145)
TCO2: 33 mmol/L — ABNORMAL HIGH (ref 22–32)
pCO2 arterial: 45.9 mmHg (ref 32.0–48.0)
pH, Arterial: 7.449 (ref 7.350–7.450)
pO2, Arterial: 122 mmHg — ABNORMAL HIGH (ref 83.0–108.0)

## 2018-06-02 LAB — C-REACTIVE PROTEIN: CRP: 9.3 mg/dL — ABNORMAL HIGH (ref <1.0)

## 2018-06-02 LAB — D-DIMER, QUANTITATIVE (NOT AT ARMC): D-Dimer, Quant: 2.13 ug/mL-FEU — ABNORMAL HIGH (ref 0.00–0.50)

## 2018-06-02 LAB — CORTISOL: Cortisol, Plasma: 13.6 ug/dL

## 2018-06-02 LAB — TSH: TSH: 2.243 u[IU]/mL (ref 0.350–4.500)

## 2018-06-02 MED ORDER — METOPROLOL TARTRATE 12.5 MG HALF TABLET
12.5000 mg | ORAL_TABLET | Freq: Every day | ORAL | Status: DC
  Filled 2018-06-02: qty 1

## 2018-06-02 MED ORDER — METOPROLOL SUCCINATE ER 25 MG PO TB24
12.5000 mg | ORAL_TABLET | Freq: Every day | ORAL | Status: DC
  Administered 2018-06-02: 12.5 mg via ORAL
  Filled 2018-06-02: qty 0.5

## 2018-06-02 NOTE — Care Management (Signed)
Case manager and social worker will continue to monitor patient for disposition when she has improved medically. May God Bless her.    Ricki Miller, RN Case Manager [phone]

## 2018-06-02 NOTE — Progress Notes (Addendum)
PROGRESS NOTE                                                                                                                                                                                                             Patient Demographics:    Shannon Carroll, is a 68 y.o. female, DOB - 12-04-1950, HQI:[id number]  Outpatient Primary MD for the patient is Harlan Stains, MD    LOS - 44  Chief Complaint  Patient presents with   Shortness of Breath       Brief Narrative  68 yr old female with PMHx HFpEF EF 60%, COPD, CVA, SVT, essential hypertension, obstructive sleep apnea, morbid obesity, who presented to the hospital with acute shortness of breath was diagnosed with COVID-19 infection, went into acute hypoxic respiratory failure intubated and kept in ICU by pulmonary critical care.  Transferred to [facility] covered ICU unit on 06/01/2018 under my care intubated.   Subjective:    Marvel Plan today is intubated and sedated   Assessment  & Plan :     1. Acute Hypoxic Resp. Failure due to Acute Covid 19 Viral Illness during the ongoing 2020 Covid 19 Pandemic - needed to be intubated, has finished treatment with hydroxychloroquine and azithromycin, continues to require endotracheal intubation with mechanical ventilation support, ARDS clinically seems to be improving, FiO2 requirements going down currently on 30%, CPAP trial started, tolerated it for a few hours on 06/02/2018, try to extubate in the next 1 to 2 days if remains hemodynamically stable and mentation is improved and able to follow commands, PCCM on board Case discussed with pulmonologist Dr. Lake Bells on 06/02/2018.  Vent Mode: PRVC FiO2 (%):  [40 %] 40 % Set Rate:  [16 bmp] 16 bmp Vt Set:  [510 mL] 510 mL PEEP:  [5 cmH20] 5 cmH20 Pressure Support:  [5 cmH20] 5 cmH20 Plateau Pressure:  [14 cmH20-21 cmH20] 21 cmH20  ABG    Component Value Date/Time   PHART 7.449 06/02/2018 0216   PCO2ART 45.9 06/02/2018 0216   PO2ART 122.0 (H) 06/02/2018 0216   HCO3 31.8 (H) 06/02/2018 0216   TCO2 33 (H) 06/02/2018 0216   O2SAT 99.0 06/02/2018 0216    2.  Septic shock.  Blood cultures negative to date presumed to be secondary to COVID-19 infection, initially required pressors through left arm PICC  line.  Now sepsis pathophysiology seems to have resolved.  3.  ARF.  Resolved.  4.  Iron deficiency anemia acute on chronic.  Some element of gradual blood loss due to repeated blood draws, have done type and screen, transfuse if hemoglobin drops below 7.  No signs of overt bleeding.  Continue PPI for prophylaxis.  5.  Hypokalemia.  Replaced and stable.  6.  Morbid obesity with underlying obstructive sleep apnea.  Outpatient follow-up with PCP.  7.  Nutrition.  Tube feedings via G-tube.  Free water flushes ordered.  8.  Hypertension.  Now getting intermittently hypotensive for the last 5 to 7 days prior to coming to [facility], cortisol levels are borderline could have subclinical renal insufficiency, for now with lowest dose Precedex blood pressure is stable, if it drops again will have low threshold of starting her on stress dose IV steroids.  9.  History of CVA.  Currently on Plavix, started on low-dose statin.  10. Dyslipidemia.  Home dose statin replaced.    11.  Short burst of SVT during CPAP trial on 06/02/2018.  Resolved, lowest dose beta-blocker at night, if blood pressure tolerates will increase beta-blocker dose, PRN IV Lopressor also on board. Recent TTE shows an EF of 65% no WMA.  12.  DM2 - ISS  CBG (last 3)  Recent Labs    06/02/18 0337 06/02/18 0806 06/02/18 1113  GLUCAP 127* 140* 133*      Nutrition.  Tube feedings via G-tube.  Free water flushes ordered.   Condition - Extremely Guarded  Family Communication  : updated son Brain 06/01/18 @ 2pm  Code Status :  Partial with No CPR  Diet : TF  Disposition Plan   :  ICU  Consults  :  PCCM  Procedures  :    Endotracheal intubation- 05/23/2018 L.Arm PICC - 05/30/18  Blood cx 05/29/2018- NGTD Novel Coronavirus Positive result called 4/4. Tracheal aspirate 4/7>>> moderate WBC (abundant GPC and GNR)  MRSA 4/5> neg    DVT Prophylaxis  :  Lovenox    Lab Results  Component Value Date   PLT 264 06/02/2018    Inpatient Medications  Scheduled Meds:  atorvastatin  20 mg Per Tube q1800   budesonide (PULMICORT) nebulizer solution  0.25 mg Nebulization BID   chlorhexidine gluconate (MEDLINE KIT)  15 mL Mouth Rinse BID   Chlorhexidine Gluconate Cloth  6 each Topical Daily   clopidogrel  75 mg Per Tube Daily   docusate  50 mg Oral BID   enoxaparin (LOVENOX) injection  60 mg Subcutaneous Q24H   feeding supplement (JEVITY 1.2 CAL)  1,000 mL Per Tube Q24H   feeding supplement (PRO-STAT SUGAR FREE 64)  60 mL Oral TID   free water  200 mL Per Tube Q8H   insulin aspart  0-15 Units Subcutaneous Q4H   ipratropium  0.5 mg Nebulization Q6H   levalbuterol  0.63 mg Nebulization Q6H   mouth rinse  15 mL Mouth Rinse 10 times per day   metoprolol tartrate  12.5 mg Oral QHS   pantoprazole sodium  40 mg Per Tube Daily   potassium chloride  40 mEq Per Tube Daily   sennosides  5 mL Oral QHS   silver sulfADIAZINE  1 application Topical Daily   sodium chloride flush  10-40 mL Intracatheter Q12H   sodium chloride flush  3 mL Intravenous Q12H   venlafaxine  37.5 mg Per Tube QHS   venlafaxine  75 mg Per Tube  q morning - 10a   Continuous Infusions:  dexmedetomidine (PRECEDEX) IV infusion Stopped (06/02/18 0800)   norepinephrine (LEVOPHED) Adult infusion Stopped (06/02/18 0900)   PRN Meds:.acetaminophen, bisacodyl, fentaNYL (SUBLIMAZE) injection, metoprolol tartrate, midazolam, [DISCONTINUED] ondansetron **OR** ondansetron (ZOFRAN) IV, Resource ThickenUp Clear  Antibiotics  :    Anti-infectives (From admission, onward)   Start     Dose/Rate  Route Frequency Ordered Stop   05/27/18 2200  ceFEPIme (MAXIPIME) 2 g in sodium chloride 0.9 % 100 mL IVPB  Status:  Discontinued     2 g 200 mL/hr over 30 Minutes Intravenous Every 8 hours 05/27/18 1322 05/27/18 1400   05/27/18 1200  ceFEPIme (MAXIPIME) 2 g in sodium chloride 0.9 % 100 mL IVPB     2 g 200 mL/hr over 30 Minutes Intravenous  Once 05/27/18 1153 05/27/18 1330   05/26/18 2200  azithromycin (ZITHROMAX) tablet 500 mg  Status:  Discontinued     500 mg Oral Daily 05/26/18 0715 05/26/18 1502   05/26/18 2200  azithromycin (ZITHROMAX) tablet 500 mg     500 mg Per Tube Daily 05/26/18 1502 05/27/18 2141   05/24/18 2200  hydroxychloroquine (PLAQUENIL) tablet 200 mg     200 mg Oral 2 times daily 05/23/18 2113 05/28/18 1025   05/23/18 2300  azithromycin (ZITHROMAX) 500 mg in sodium chloride 0.9 % 250 mL IVPB  Status:  Discontinued     500 mg 250 mL/hr over 60 Minutes Intravenous Every 24 hours 05/23/18 2250 05/26/18 0715   05/23/18 2200  hydroxychloroquine (PLAQUENIL) tablet 400 mg     400 mg Oral 2 times daily 05/23/18 2113 05/24/18 1114   05/23/18 1500  vancomycin (VANCOCIN) 1,250 mg in sodium chloride 0.9 % 250 mL IVPB  Status:  Discontinued     1,250 mg 166.7 mL/hr over 90 Minutes Intravenous Every 24 hours 05/29/2018 1532 05/25/18 1141   05/23/18 0230  ceFEPIme (MAXIPIME) 2 g in sodium chloride 0.9 % 100 mL IVPB  Status:  Discontinued     2 g 200 mL/hr over 30 Minutes Intravenous Every 12 hours 06/16/2018 1532 05/25/18 1334   06/18/2018 1545  metroNIDAZOLE (FLAGYL) IVPB 500 mg     500 mg 100 mL/hr over 60 Minutes Intravenous  Once 06/03/2018 1531 05/30/2018 1731   05/30/2018 1530  vancomycin (VANCOCIN) IVPB 1000 mg/200 mL premix     1,000 mg 200 mL/hr over 60 Minutes Intravenous  Once 06/06/2018 1444 06/18/2018 1731   05/21/2018 1415  vancomycin (VANCOCIN) IVPB 1000 mg/200 mL premix     1,000 mg 200 mL/hr over 60 Minutes Intravenous  Once 06/04/2018 1401 06/17/2018 1538   05/25/2018 1415  ceFEPIme  (MAXIPIME) 2 g in sodium chloride 0.9 % 100 mL IVPB     2 g 200 mL/hr over 30 Minutes Intravenous  Once 06/13/2018 1401 06/18/2018 1509       Time Spent in minutes  30   Lala Lund M.D on 06/02/2018 at 2:14 PM  To page go to [email or web address] - password [email or web address]  [facility] -  Office  [phone]  Total Critical Care time in examining the patient bedside, evaluating Lab work and other data, over half of the total time was spent in coordinating patient care on the floor or bedisde in talking to patient/family members, communicating with nursing Staff on the floor and sub specialists PCCM  to coordinate patients medical care and needs is  35 Minutes.   The condition which has caused critical injury/acute impairment of Pulmonary vital organ system  with a high probability of sudden clinically significant deterioration and can cause Potential Life threatening injury to this patient addressed today is  Acute Hypoxic Resp failure.  See all Orders from today for further details   Admit date - 06/09/2018    11    Objective:   Vitals:   06/02/18 1245 06/02/18 1300 06/02/18 1303 06/02/18 1330  BP: 120/83 (!) 125/98  (!) 124/93  Pulse: (!) 105 (!) 106  (!) 108  Resp: (!) 24 (!) 26  (!) 22  Temp:      TempSrc:      SpO2: 100% 100% 100% 100%  Weight:      Height:        Wt Readings from Last 3 Encounters:  06/02/18 125 kg  05/19/18 131.9 kg  05/04/18 127 kg     Intake/Output Summary (Last 24 hours) at 06/02/2018 1414 Last data filed at 06/02/2018 1300 Gross per 24 hour  Intake 2835.56 ml  Output 1050 ml  Net 1785.56 ml     Physical Exam  Intubated, mildly sedated, opens eyes to verbal commands, moves all 4 extremities to painful stimuli,  ETT,OG, Foley, L arm PICC Mystic.AT,PERRAL Supple Neck,No JVD, No cervical lymphadenopathy appriciated.  Symmetrical Chest wall movement, Good air movement bilaterally, CTAB RRR,No Gallops, Rubs or new Murmurs, No Parasternal Heave +ve  B.Sounds, Abd Soft, No tenderness, No organomegaly appriciated, No rebound - guarding or rigidity. No Cyanosis, Clubbing or edema, No new Rash or bruise      Data Review:    CBC Recent Labs  Lab 05/27/18 0636  05/29/18 1517  05/30/18 0327 05/31/18 0521 06/01/18 0225 06/01/18 0406 06/01/18 1854 06/02/18 0216 06/02/18 0500  WBC 8.1   < > 8.8  --  7.9 7.9  --  8.3  --   --  7.3  HGB 7.9*   < > 9.0*   < > 8.2* 7.4* 9.5* 7.5* 8.8* 11.6* 7.4*  HCT 29.2*   < > 32.2*   < > 29.8* 27.2* 28.0* 26.5* 26.0* 34.0* 26.9*  PLT 212   < > 251  --  278 277  --  265  --   --  264  MCV 89.8   < > 90.2  --  88.4 91.3  --  89.8  --   --  92.1  MCH 24.3*   < > 25.2*  --  24.3* 24.8*  --  25.4*  --   --  25.3*  MCHC 27.1*   < > 28.0*  --  27.5* 27.2*  --  28.3*  --   --  27.5*  RDW 18.4*   < > 19.3*  --  19.2* 19.7*  --  20.4*  --   --  20.8*  LYMPHSABS 0.3*  --   --   --   --   --   --   --   --   --  1.1  MONOABS 0.2  --   --   --   --   --   --   --   --   --  0.3  EOSABS 0.0  --   --   --   --   --   --   --   --   --  0.1  BASOSABS 0.0  --   --   --   --   --   --   --   --   --  0.0   < > =  values in this interval not displayed.    Chemistries  Recent Labs  Lab 05/27/18 0636 05/28/18 0338 05/29/18 1053  05/30/18 0327 05/31/18 0521 06/01/18 0225 06/01/18 0406 06/01/18 1854 06/02/18 0216 06/02/18 0500  NA 144 145 143   < > 144 145 145 146* 145 145 145  K 4.2 3.8 4.3   < > 3.5 3.4* 3.3* 3.0* 4.3 4.3 4.3  CL 99 99 96*  --  98 100  --  101 104  --  107  CO2 36* 36* 34*  --  34* 35*  --  32  --   --  31  GLUCOSE 136* 121* 191*  --  125* 111*  --  161* 142*  --  131*  BUN 34* 32* 36*  --  34* 32*  --  32* 38*  --  44*  CREATININE 1.07* 1.01* 1.08*  --  0.87 0.87  --  1.04* 0.90  --  0.97  CALCIUM 8.4* 8.4* 8.6*  --  8.2* 8.2*  --  8.4*  --   --  8.0*  MG  --  2.2 2.4  --   --   --   --   --   --   --  2.5*  AST 43*  --   --   --  37 31  --  31  --   --  31  ALT 29  --   --   --  21 21   --  21  --   --  20  ALKPHOS 76  --   --   --  87 93  --  96  --   --  135*  BILITOT 1.5*  --   --   --  1.3* 1.0  --  1.2  --   --  1.3*   < > = values in this interval not displayed.   ------------------------------------------------------------------------------------------------------------------ No results for input(s): CHOL, HDL, LDLCALC, TRIG, CHOLHDL, LDLDIRECT in the last 72 hours.  Lab Results  Component Value Date   HGBA1C 5.2 03/02/2018   ------------------------------------------------------------------------------------------------------------------ Recent Labs    06/02/18 0500  TSH 2.243   ------------------------------------------------------------------------------------------------------------------ No results for input(s): VITAMINB12, FOLATE, FERRITIN, TIBC, IRON, RETICCTPCT in the last 72 hours.  Coagulation profile No results for input(s): INR, PROTIME in the last 168 hours.  Recent Labs    06/02/18 0500  DDIMER 2.13*    Cardiac Enzymes Recent Labs  Lab 05/30/18 0327 05/31/18 0521 06/01/18 0406  TROPONINI 0.03* 0.03* <0.03   ------------------------------------------------------------------------------------------------------------------    Component Value Date/Time   BNP 2,117.1 (H) 05/24/2018 1402    Micro Results Recent Results (from the past 240 hour(s))  MRSA PCR Screening     Status: None   Collection Time: 05/24/18 12:51 AM  Result Value Ref Range Status   MRSA by PCR NEGATIVE NEGATIVE Final    Comment:        The GeneXpert MRSA Assay (FDA approved for NASAL specimens only), is one component of a comprehensive MRSA colonization surveillance program. It is not intended to diagnose MRSA infection nor to guide or monitor treatment for MRSA infections. Performed at [facility], [place] [address] [place], [place] [address]   Culture, respiratory (non-expectorated)     Status: None   Collection Time: 05/26/18  9:35 AM  Result  Value Ref Range Status   Specimen Description TRACHEAL ASPIRATE  Final   Special Requests NONE  Final   Gram Stain  Final    MODERATE WBC PRESENT,BOTH PMN AND MONONUCLEAR FEW SQUAMOUS EPITHELIAL CELLS PRESENT ABUNDANT GRAM POSITIVE COCCI ABUNDANT GRAM NEGATIVE RODS FEW GRAM POSITIVE RODS    Culture   Final    MODERATE Consistent with normal respiratory flora. Performed at [facility], [place] [address] [place], [place] [address]    Report Status 05/28/2018 FINAL  Final    Radiology Reports Ct Chest Wo Contrast  Result Date: 06/10/2018 CLINICAL DATA:  68 year old female with shortness of breath, fever. Patient also has a history of congestive heart failure. EXAM: CT CHEST WITHOUT CONTRAST TECHNIQUE: Multidetector CT imaging of the chest was performed following the standard protocol without IV contrast. COMPARISON:  Chest x-ray obtained earlier today; prior chest x-ray 05/19/18 FINDINGS: Cardiovascular: Limited evaluation in the absence of intravenous contrast. Cardiomegaly. Calcification of the mitral valve annulus. Coronary artery calcifications also present. Atherosclerotic calcifications throughout the aorta. No pericardial effusion. Mediastinum/Nodes: Thyroid gland is unremarkable. Borderline enlarged mediastinal lymph nodes. Index right paratracheal lymph node measures 1.2 cm in short axis. Index low right paratracheal lymph node measures 1.2 cm in short axis. Unremarkable thoracic esophagus. Lungs/Pleura: Extensive patchy ground-glass and more consolidative airspace opacities in a peripheral and peribronchovascular distribution throughout all lobes of both lungs but preferentially within the upper lobes. There is relative sparing of the middle lobe. Somewhat less significant disease within the dependent portions of the right lower lobe makes aspiration less likely. Additionally, there is mild bilateral lower lobe atelectasis secondary to the presence of small bilateral pleural effusions.  Upper Abdomen: Small volume perihepatic ascites. Relative right renal atrophy. Musculoskeletal: No acute fracture or aggressive appearing lytic or blastic osseous lesion. IMPRESSION: 1. There are a spectrum of findings in the lungs which can be seen with acute atypical infection (as well as other non-infectious etiologies). In particular, viral pneumonia (including COVID-19) should be considered in the appropriate clinical setting. Given the upper lung predominant pattern of distribution, aspiration is considered significantly less likely despite the patient's underlying risk factors. Similarly, congestive heart failure is considered less likely given the absence of interlobular septal thickening. 2. Small to moderate bilateral layering pleural effusions. 3. Small volume perihepatic site ease of uncertain etiology. 4. Borderline enlarged mediastinal lymph nodes are favored to be reactive. 5. Coronary artery calcifications. 6.  Aortic Atherosclerosis (ICD10-170.0). 7. Relative right renal atrophy. These results were called by telephone at the time of interpretation on 05/21/2018 at 4:42 pm to Dr. Evangeline Gula, who verbally acknowledged these results. Electronically Signed   By: Jacqulynn Cadet M.D.   On: 06/06/2018 16:43   Dg Chest Port 1 View  Result Date: 06/02/2018 CLINICAL DATA:  Short of breath EXAM: PORTABLE CHEST 1 VIEW COMPARISON:  05/30/2018 FINDINGS: Endotracheal and NG tubes are stable. Left PICC placed. Tip is at the cavoatrial junction. Diffuse airspace disease and bilateral pleural effusions are stable. Cardiac silhouette is prominent likely due to AP technique. No pneumothorax. IMPRESSION: Stable diffuse bilateral airspace disease and bilateral pleural effusions. Left PICC placed.  Tip is at the cavoatrial junction. Electronically Signed   By: Marybelle Killings M.D.   On: 06/02/2018 07:40   Dg Chest Port 1 View  Result Date: 05/30/2018 CLINICAL DATA:  ARDS EXAM: PORTABLE CHEST 1 VIEW COMPARISON:   05/28/2018 FINDINGS: Endotracheal tube in good position.  NG tube in the stomach. Cardiac enlargement. Extensive diffuse bilateral airspace disease unchanged. Bilateral pleural effusions and bibasilar atelectasis unchanged. IMPRESSION: Diffuse bilateral airspace disease and bilateral effusions unchanged. Electronically Signed  By: Franchot Gallo M.D.   On: 05/30/2018 08:14   Dg Chest Port 1 View  Result Date: 05/28/2018 CLINICAL DATA:  68 year old female with ARDS EXAM: PORTABLE CHEST 1 VIEW COMPARISON:  Multiple prior most recent 05/27/2018, 05/23/2018, CT 06/03/2018 FINDINGS: Cardiomediastinal silhouette unchanged in size and contour, with cardiomegaly. Endotracheal tube unchanged terminating 2.7 cm above the carina. Unchanged gastric tube terminating out of the field of view. Low lung volumes with veil opacities at the bilateral lung bases obscuring the hemidiaphragm. Patchy airspace and interstitial opacities bilaterally, similar to the comparison. No pneumothorax. IMPRESSION: Unchanged endotracheal tube and gastric tube. Similar appearance of mixed interstitial and airspace opacities of the bilateral lungs, with bibasilar pleural effusions. Electronically Signed   By: Corrie Mckusick D.O.   On: 05/28/2018 07:53   Dg Chest Port 1 View  Result Date: 05/27/2018 CLINICAL DATA:  68 year old female with respiratory failure EXAM: PORTABLE CHEST 1 VIEW COMPARISON:  05/23/2018, 05/24/2018, 05/19/2018 FINDINGS: Cardiomediastinal silhouette unchanged in size and contour. Endotracheal tube terminates 2.7 cm above the carina. Unchanged gastric tube, with the tip terminating just below the diaphragm. No pneumothorax. Worsening airspace opacity in the left lung and right mid lung with veil opacity at the lung bases obscuring the hemidiaphragms and heart borders. Interval removal of the defibrillator pads. IMPRESSION: Unchanged endotracheal tube and gastric tube. Persisting interstitial and airspace opacity of the lungs,  worsening on the left. Small bilateral pleural effusions and associated atelectasis/consolidation. Electronically Signed   By: Corrie Mckusick D.O.   On: 05/27/2018 08:02   Dg Chest Port 1 View  Result Date: 05/23/2018 CLINICAL DATA:  Intubation EXAM: PORTABLE CHEST 1 VIEW COMPARISON:  06/11/2018 FINDINGS: Endotracheal tube tip is 3 cm above the carina. Orogastric or nasogastric tube enters the abdomen. The tip may be just beyond the gastroesophageal junction. Widespread bilateral pulmonary infiltrates persist. No worsening or new finding by radiography. IMPRESSION: Endotracheal tube 3 cm above the carina. Orogastric or nasogastric tube enters the abdomen, just beyond the gastroesophageal junction. Widespread bilateral infiltrates persist. Electronically Signed   By: Nelson Chimes M.D.   On: 05/23/2018 21:38   Dg Chest Port 1 View  Result Date: 05/20/2018 CLINICAL DATA:  Shortness of breath since yesterday. Tachycardia. Former smoker. EXAM: PORTABLE CHEST 1 VIEW COMPARISON:  05/19/2018 FINDINGS: Lungs are adequately inflated and demonstrate worsening of bilateral multifocal airspace opacification which is most prominent over the right upper lobe and left perihilar region. Possible small amount left pleural fluid unchanged. Mild stable cardiomegaly. Remainder the exam is unchanged. IMPRESSION: Interval worsening bilateral multifocal airspace process most prominent over the right upper lobe and left perihilar region likely multifocal pneumonia. Small amount left pleural fluid unchanged. Mild stable cardiomegaly. Electronically Signed   By: Marin Olp M.D.   On: 05/28/2018 14:29   Dg Chest Port 1 View  Result Date: 05/19/2018 CLINICAL DATA:  Respiratory failure and congestive heart failure. EXAM: PORTABLE CHEST 1 VIEW COMPARISON:  05/18/2018 FINDINGS: Stable cardiac enlargement. Slight diminishment in CHF since the prior study. There is some asymmetric vascular congestion versus atelectasis in the right upper  lung. Stable left basilar atelectasis and probable component of small to moderate left pleural effusion. IMPRESSION: Decreased prominence of CHF since the prior chest x-ray. Asymmetric vascular congestion versus atelectasis in the right upper lung. Left basilar atelectasis with probable component of left pleural fluid. Electronically Signed   By: Aletta Edouard M.D.   On: 05/19/2018 08:27   Dg Chest Port 1 View  Result Date:  05/18/2018 CLINICAL DATA:  Short of breath EXAM: PORTABLE CHEST 1 VIEW COMPARISON:  05/17/2018 FINDINGS: Stable enlarged cardiac silhouette. Bilateral pleural effusions similar prior. A central venous congestion. No focal consolidation. No pneumothorax. IMPRESSION: Cardiomegaly with small bilateral pleural effusions. Concern for mild congestive heart failure Electronically Signed   By: Suzy Bouchard M.D.   On: 05/18/2018 08:16   Dg Chest Port 1 View  Result Date: 05/17/2018 CLINICAL DATA:  Shortness of breath EXAM: PORTABLE CHEST 1 VIEW COMPARISON:  05/16/2018 FINDINGS: Cardiomegaly with mild perihilar edema and small bilateral pleural effusions, grossly unchanged. No pneumothorax. IMPRESSION: Cardiomegaly mild interstitial edema and small bilateral pleural effusions, grossly unchanged. Electronically Signed   By: Julian Hy M.D.   On: 05/17/2018 08:11   Dg Chest Port 1 View  Result Date: 05/16/2018 CLINICAL DATA:  Shortness of breath EXAM: PORTABLE CHEST 1 VIEW COMPARISON:  05/15/2018 FINDINGS: Cardiomegaly with mild perihilar edema. Small to moderate bilateral pleural effusions, increased. No pneumothorax. IMPRESSION: Cardiomegaly with mild perihilar edema and small to moderate bilateral pleural effusions, increased. Electronically Signed   By: Julian Hy M.D.   On: 05/16/2018 07:41   Dg Chest Port 1 View  Result Date: 05/15/2018 CLINICAL DATA:  Shortness of breath EXAM: PORTABLE CHEST 1 VIEW COMPARISON:  May 14, 2018 FINDINGS: There is persistent  cardiomegaly. Pulmonary vascularity is within normal limits. Currently there is no appreciable edema or consolidation. No adenopathy. No bone lesions. There is aortic atherosclerosis. IMPRESSION: Cardiomegaly. No edema or consolidation. Pulmonary vascularity within normal limits. Aortic Atherosclerosis (ICD10-I70.0). Electronically Signed   By: Lowella Grip III M.D.   On: 05/15/2018 07:35   Dg Chest Port 1 View  Result Date: 05/14/2018 CLINICAL DATA:  Aspiration EXAM: PORTABLE CHEST 1 VIEW COMPARISON:  May 14, 2018 study obtained earlier in the day FINDINGS: There is cardiomegaly with mild pulmonary venous hypertension. There are pleural effusions bilaterally. There is no appreciable edema or consolidation. There is aortic atherosclerosis. No adenopathy. No bone lesions. IMPRESSION: Pulmonary vascular congestion with small pleural effusions bilaterally. No frank edema or consolidation. Aortic Atherosclerosis (ICD10-I70.0). Electronically Signed   By: Lowella Grip III M.D.   On: 05/14/2018 16:00   Dg Chest Port 1 View  Result Date: 05/14/2018 CLINICAL DATA:  Short of breath EXAM: PORTABLE CHEST 1 VIEW COMPARISON:  05/06/2018 FINDINGS: Cardiac enlargement with mild progression of vascular congestion. Progression of bibasilar atelectasis and small effusions. Atherosclerotic ascending aorta and aortic arch. IMPRESSION: Congestive heart failure with mild progression. Electronically Signed   By: Franchot Gallo M.D.   On: 05/14/2018 08:31   Dg Chest Port 1 View  Result Date: 05/06/2018 CLINICAL DATA:  Dyspnea. Bilateral leg swelling. EXAM: PORTABLE CHEST 1 VIEW COMPARISON:  01/21/2018. FINDINGS: Poor inspiration. No gross change in borderline enlargement of the cardiac silhouette and mild prominence of the pulmonary vasculature and interstitial markings. No visible pleural fluid. Thoracic spine degenerative changes. IMPRESSION: Stable borderline cardiomegaly, mild pulmonary vascular congestion and  mild chronic interstitial lung disease. Electronically Signed   By: Claudie Revering M.D.   On: 05/06/2018 18:07   [place] Ekg Site Rite  Result Date: 05/30/2018 If Site Rite image not attached, placement could not be confirmed due to current cardiac rhythm.  [place] Ekg Site Rite  Result Date: 05/30/2018 If Site Rite image not attached, placement could not be confirmed due to current cardiac rhythm.  US Abdomen Limited Ruq  Result Date: 05/14/2018 CLINICAL DATA:  Elevated LFTs. EXAM: ULTRASOUND ABDOMEN LIMITED RIGHT UPPER QUADRANT COMPARISON:  None. FINDINGS: Gallbladder: Physiologically distended. No gallstones. No significant gallbladder wall thickening. No sonographic Murphy sign noted by sonographer. Common bile duct: Diameter: 5 mm. Liver: No focal lesion identified. Diffusely heterogeneous and slightly increased in parenchymal echogenicity. Portal vein is patent on color Doppler imaging with normal direction of blood flow towards the liver. Small amount of right upper quadrant ascites. Right pleural effusion noted. IMPRESSION: 1. Mild hepatic steatosis. 2. No gallstones.  No biliary dilatation. 3. Small volume of ascites.  Right pleural effusion. Electronically Signed   By: Keith Rake M.D.   On: 05/14/2018 22:18

## 2018-06-02 NOTE — Progress Notes (Signed)
NAME:  Shannon Carroll, MRN:  [id number], DOB:  [birth year], LOS: 38 ADMISSION DATE:  06/17/2018, CONSULTATION DATE:  05/23/2018 REFERRING MD:  Glenna Durand, CHIEF COMPLAINT:  SVT hpoxemia, COVID 70 POS   Brief History   68 yr old female with PMHx HFpEF, COPD, w/ bilateral infiltrates COVID positive on supplemental oxygen 8L with increased work of breathing, fatigue, and diaphoretic. PCCM consulted for acute respiratory distress.  Significant Hospital Events   4/3 + COVID -19  4/4 ICU admit intubated  4/12 weaning trial, failed, de-sat, SVT   Consults:  PCCM   Procedures:  Endotracheal intubation- 05/23/2018  Significant Diagnostic Tests:  Bilateral alveolar infiltrates on CXR -CXR 4/8 with worsening LLL and RML opacity   Micro Data:  Blood cx 05/20/2018- NGTD Novel Coronavirus Positive result called 4/4. Tracheal aspirate 4/7>>> moderate WBC (abundant GPC and GNR)  MRSA 4/5> neg   Antimicrobials:  Vancomycin- 05/23/2018 Cefepime- 05/23/2018-4/6, 4/8>>  Hydroxychloroquine- upon transfer to ICU -COVID (to end 4/9) Axithromycin - upon transfer to ICU- COVID (to end 4/8)  Interim history/subjective:  Tolerating spontaneous breathing trial fairly well, seems to be more alert this morning, still not following commands.  Objective   Blood pressure (!) 122/100, pulse (!) 108, temperature 98.3 F (36.8 C), temperature source Oral, resp. rate (!) 26, height 5\' 8"  (1.727 m), weight 125 kg, SpO2 100 %. CVP:  [5 mmHg-18 mmHg] 18 mmHg  Vent Mode: PSV;CPAP FiO2 (%):  [30 %-40 %] 40 % Set Rate:  [16 bmp] 16 bmp Vt Set:  [510 mL] 510 mL PEEP:  [5 cmH20] 5 cmH20 Pressure Support:  [5 cmH20-15 cmH20] 5 cmH20 Plateau Pressure:  [14 cmH20-24 cmH20] 19 cmH20   Intake/Output Summary (Last 24 hours) at 06/02/2018 1111 Last data filed at 06/02/2018 1100 Gross per 24 hour  Intake 3125.56 ml  Output 1030 ml  Net 2095.56 ml   Filed Weights   05/31/18 0500 06/01/18 0500 06/02/18 0330  Weight: 124.1 kg  123.2 kg 125 kg    Examination:   General:  In bed on vent HENT: NCAT ETT in place PULM: CTA B, vent supported breathing CV: RRR, no mgr GI: BS+, soft, nontender MSK: normal bulk and tone Neuro: awake on vent, tracks with eyes, doesn't follow commands    Resolved Hospital Problem list   Transaminits.  Septic shock  Assessment & Plan:   Acute Hypoxic Respiratory Failure on mechanical ventilation  -ARDS secondary to  COVID-19 viral pneumonia > continue diuresis > pressure support weaning as tolerated > monitor mental status, as improves could extubate > VAP prevention  Baseline HFpEF > diurese as able  COPD: > continue xopenex/atrovent and pulmicort nebulized  Acute toxic metabolic delirium > minmize sedation > correct electrolyte abnormalities  Best practice:  Diet:tube feed Pain/Anxiety/Delirium protocol (if indicated): Continuous infusion VAP protocol (if indicated): NA DVT prophylaxis: SCD, Lovenox GI prophylaxis: Protonix Glucose control: sliding scale Mobility: bed bound Code Status: Partial code - REMAINS NO CPR, but if [employer] OK IF WE PUT BACK ETT Family Communication: I spoke with patients son Disposition: ICU   Labs   CBC: Recent Labs  Lab 05/27/18 0636  05/29/18 1517  05/30/18 0327 05/31/18 0521 06/01/18 0225 06/01/18 0406 06/01/18 1854 06/02/18 0216 06/02/18 0500  WBC 8.1   < > 8.8  --  7.9 7.9  --  8.3  --   --  7.3  NEUTROABS 7.5  --   --   --   --   --   --   --   --   --  5.8  HGB 7.9*   < > 9.0*   < > 8.2* 7.4* 9.5* 7.5* 8.8* 11.6* 7.4*  HCT 29.2*   < > 32.2*   < > 29.8* 27.2* 28.0* 26.5* 26.0* 34.0* 26.9*  MCV 89.8   < > 90.2  --  88.4 91.3  --  89.8  --   --  92.1  PLT 212   < > 251  --  278 277  --  265  --   --  264   < > = values in this interval not displayed.    Basic Metabolic Panel: Recent Labs  Lab 05/28/18 0338 05/29/18 1053  05/30/18 0327 05/31/18 0521 06/01/18 0225 06/01/18 0406 06/01/18  1854 06/02/18 0216 06/02/18 0500  NA 145 143   < > 144 145 145 146* 145 145 145  K 3.8 4.3   < > 3.5 3.4* 3.3* 3.0* 4.3 4.3 4.3  CL 99 96*  --  98 100  --  101 104  --  107  CO2 36* 34*  --  34* 35*  --  32  --   --  31  GLUCOSE 121* 191*  --  125* 111*  --  161* 142*  --  131*  BUN 32* 36*  --  34* 32*  --  32* 38*  --  44*  CREATININE 1.01* 1.08*  --  0.87 0.87  --  1.04* 0.90  --  0.97  CALCIUM 8.4* 8.6*  --  8.2* 8.2*  --  8.4*  --   --  8.0*  MG 2.2 2.4  --   --   --   --   --   --   --  2.5*  PHOS  --  4.4  --   --   --   --   --   --   --   --    < > = values in this interval not displayed.   GFR: Estimated Creatinine Clearance: 78.5 mL/min (by C-G formula based on SCr of 0.97 mg/dL). Recent Labs  Lab 05/27/18 0636  05/30/18 0327 05/31/18 0521 05/31/18 0523 06/01/18 0406 06/02/18 0500  PROCALCITON 0.12  --   --   --   --   --   --   WBC 8.1   < > 7.9 7.9  --  8.3 7.3  LATICACIDVEN  --   --  1.1  --  0.8 1.0  --    < > = values in this interval not displayed.    Liver Function Tests: Recent Labs  Lab 05/27/18 0636 05/30/18 0327 05/31/18 0521 06/01/18 0406 06/02/18 0500  AST 43* 37 31 31 31   ALT 29 21 21 21 20   ALKPHOS 76 87 93 96 135*  BILITOT 1.5* 1.3* 1.0 1.2 1.3*  PROT 6.6 6.5 5.8* 6.0* 6.4*  ALBUMIN 2.1* 2.0* 1.8* 2.0* 2.3*   No results for input(s): LIPASE, AMYLASE in the last 168 hours. No results for input(s): AMMONIA in the last 168 hours.  ABG    Component Value Date/Time   PHART 7.449 06/02/2018 0216   PCO2ART 45.9 06/02/2018 0216   PO2ART 122.0 (H) 06/02/2018 0216   HCO3 31.8 (H) 06/02/2018 0216   TCO2 33 (H) 06/02/2018 0216   O2SAT 99.0 06/02/2018 0216     Coagulation Profile: No results for input(s): INR, PROTIME in the last 168 hours.  Cardiac Enzymes: Recent Labs  Lab 05/27/18 0636 05/30/18 0327 05/31/18 0521 06/01/18 0406  CKTOTAL  13*  --   --   --   TROPONINI  --  0.03* 0.03* <0.03    HbA1C: Hgb A1c MFr Bld  Date/Time  Value Ref Range Status  03/02/2018 04:43 PM 5.2 4.8 - 5.6 % Final    Comment:             Prediabetes: 5.7 - 6.4          Diabetes: >6.4          Glycemic control for adults with diabetes: <7.0     CBG: Recent Labs  Lab 06/01/18 1723 06/01/18 2028 06/01/18 2355 06/02/18 0337 06/02/18 0806  GLUCAP 114* 129* 149* 127* 140*    This patient is critically ill with multiple organ system failure; which, requires frequent high complexity decision making, assessment, support, evaluation, and titration of therapies. This was completed through the application of advanced monitoring technologies and extensive interpretation of multiple databases. During this encounter critical care time was devoted to patient care services described in this note for 31 minutes.    Roselie Awkward, MD [place] [facility] Pager: [phone] Cell: [phone] If no response, call [phone]

## 2018-06-02 NOTE — Evaluation (Addendum)
Physical Therapy Evaluation Patient Details Name: Shannon Carroll MRN: [id number] DOB: 1950-11-22 Today's Date: 06/02/2018   History of Present Illness  68 y.o. female recently d/c from the hospital on 05/19/18 for CHF exacerbation to [facility] for rehab.  She was re-admitted on 06/14/2018 for SOB and hypoxia.  Pt dx with COVID-19, septic shock, ARF, iron deficient anemia, and short bursts of SVT.  Intubated 05/23/18-still currently intubated.  Pt with significant PMH of stroke (presumed L deficits), obesity, HTN, DM, and CKD (stage 2).  Clinical Impression  Pt is alert, ventilated via ETT with FiO2 40%, PEEP 5, 100% O2 and HR 110s during PROM, chair position and bed level assessment.  Her left side seems to be the side that was affected by her h/o CVA.  She is spontaneously moving her right side and does look at me when I say her name.  She is slow and weak to follow basic commands.  She did recently work with [place] (at [facility]) on 3/31 where she was mod to max assist of two to stand and pivot with the steady standing frame.  She was d/c to [facility] for rehab purposes.  I believe the plan is potentially return to SNF when she can for further rehab.  PT will continue to follow acutely for safe mobility progression  We will attempt EOB next session if she continues to be stable.   Follow Up Recommendations SNF    Equipment Recommendations  Wheelchair (measurements PT);Wheelchair cushion (measurements PT);Hospital bed;Other (comment)(hoyer lift)    Recommendations for Other Services OT consult;Speech consult(once extubated)     Precautions / Restrictions Precautions Precautions: Fall                Pertinent Vitals/Pain Pain Assessment: Faces Faces Pain Scale: No hurt    Home Living Family/patient expects to be discharged to:: Assisted living Living Arrangements: Spouse/significant other Available Help at Discharge: Personal care attendant Type of Home: Assisted living Home Access: Level  entry     Home Layout: One level Home Equipment: Walker - 4 wheels;Shower seat;Grab bars - toilet;Grab bars - tub/shower;Bedside commode      Prior Function Level of Independence: Needs assistance   Gait / Transfers Assistance Needed: RW or wheel chair for mobility, RW short distances with help  ADL's / Homemaking Assistance Needed: assist for bathing and dressing and all IADL  Comments: Prior to [place] hospitalization, pt lived at [facility], then she d/c to [facility] for rehab and returned to the hospital on 05/31/2018.       Hand Dominance   Dominant Hand: Right    Extremity/Trunk Assessment   Upper Extremity Assessment Upper Extremity Assessment: Generalized weakness;RUE deficits/detail;LUE deficits/detail RUE Deficits / Details: right arm stiff from immobility, however, pt able to open grasp and release fingers, ROM to elbow and shoulder loosened with increased repetitions.   LUE Deficits / Details: left arm with increased tone, difficulty getting tone to release on her left arm with tight grasp.  Pt tolerated ROM well, but was unable to achieve full ROM with repetition due to increased tone on this side.     Lower Extremity Assessment Lower Extremity Assessment: RLE deficits/detail;LLE deficits/detail RLE Deficits / Details: right leg is also more freely mobilie compared to left leg and pt is weakly moving it spontaneously.  She has neutral DF with firm end feel and no signs of pressure spots on both heels.  She does release to neutral DF with prolonged stretch.  Hip and knee are  tight, but loosen with repetitive ROM.  Strength observed through spontaneous movment is rougly 2/5 LLE Deficits / Details: left leg with increased tone compared to right leg, grossly deminished ROM throughout this side compared to right side with hip relaxed into ER.  Left side did not release as well as R side with repeated ROM.     Cervical / Trunk Assessment Cervical / Trunk Assessment: Other  exceptions Cervical / Trunk Exceptions: Pt's head was laterally flexed to the R (vent side), with passive stretch into left lateral side bend, pt was able to more freely actively move her head.   Communication   Communication: Other (comment)(ETT)  Cognition Arousal/Alertness: Awake/alert Behavior During Therapy: WFL for tasks assessed/performed Overall Cognitive Status: Difficult to assess                     Current Attention Level: Focused   Following Commands: Follows one step commands inconsistently     Problem Solving: Slow processing General Comments: Pt was able to focus her attention towards whoever was saying her name, followed 1-2 basic commands (weakly) given increased time to process and repetition of command.        General Comments General comments (skin integrity, edema, etc.): Pt tolerated ROM to all 4 extremities and head/neck with VSS.  Pt was positione in chair mode with HOB 45 degrees and feet down ~30 degrees.  BP soft, but stable in the 110s/80s, PRVC mode on the vent with FiO240%, PEEP 5 and RR in the upper 20s.  O2 sats 100%, HR 110s.      Exercises General Exercises - Upper Extremity Shoulder Flexion: PROM;Both;10 reps Elbow Flexion: PROM;Both;10 reps General Exercises - Lower Extremity Ankle Circles/Pumps: PROM;Both;5 reps Heel Slides: PROM;Both;10 reps Hip ABduction/ADduction: PROM;Both;10 reps   Assessment/Plan    PT Assessment Patient needs continued PT services  PT Problem List Decreased strength;Decreased range of motion;Decreased activity tolerance;Decreased balance;Decreased mobility;Decreased cognition;Decreased knowledge of use of DME;Cardiopulmonary status limiting activity;Obesity;Impaired tone;Decreased skin integrity       PT Treatment Interventions DME instruction;Gait training;Therapeutic activities;Functional mobility training;Therapeutic exercise;Neuromuscular re-education;Balance training;Cognitive remediation;Patient/family  education;Wheelchair mobility training;Manual techniques    PT Goals (Current goals can be found in the Care Plan section)  Acute Rehab PT Goals Patient Stated Goal: unable to state PT Goal Formulation: Patient unable to participate in goal setting Time For Goal Achievement: 06/16/18 Potential to Achieve Goals: Fair    Frequency Min 3X/week           AM-PAC PT "6 Clicks" Mobility  Outcome Measure Help needed turning from your back to your side while in a flat bed without using bedrails?: Total Help needed moving from lying on your back to sitting on the side of a flat bed without using bedrails?: Total Help needed moving to and from a bed to a chair (including a wheelchair)?: Total Help needed standing up from a chair using your arms (e.g., wheelchair or bedside chair)?: Total Help needed to walk in hospital room?: Total Help needed climbing 3-5 steps with a railing? : Total 6 Click Score: 6    End of Session Equipment Utilized During Treatment: Oxygen;Other (comment)(vent) Activity Tolerance: Patient tolerated treatment well Patient left: in bed;with nursing/sitter in room(in chair mode) Nurse Communication: Other (comment)(RN in room and observing session) PT Visit Diagnosis: Muscle weakness (generalized) (M62.81);Difficulty in walking, not elsewhere classified (R26.2)    Time: 1430-1500 PT Time Calculation (min) (ACUTE ONLY): 30 min   Charges:  [name] Alainna Stawicki, PT, DPT  Acute Rehabilitation #[phone] pager #[phone] office    PT Evaluation $PT Eval Moderate Complexity: 1 Mod PT Treatments $Therapeutic Exercise: 8-22 mins      06/02/2018, 4:09 PM

## 2018-06-02 NOTE — Progress Notes (Signed)
Assisted tele visit to patient with son.  Aryanne Gilleland Parker, RN  

## 2018-06-03 LAB — CBC WITH DIFFERENTIAL/PLATELET
Abs Immature Granulocytes: 0.07 10*3/uL (ref 0.00–0.07)
Basophils Absolute: 0.1 10*3/uL (ref 0.0–0.1)
Basophils Relative: 1 %
Eosinophils Absolute: 0 10*3/uL (ref 0.0–0.5)
Eosinophils Relative: 0 %
HCT: 29.3 % — ABNORMAL LOW (ref 36.0–46.0)
Hemoglobin: 8.1 g/dL — ABNORMAL LOW (ref 12.0–15.0)
Immature Granulocytes: 1 %
Lymphocytes Relative: 11 %
Lymphs Abs: 1.1 10*3/uL (ref 0.7–4.0)
MCH: 25.5 pg — ABNORMAL LOW (ref 26.0–34.0)
MCHC: 27.6 g/dL — ABNORMAL LOW (ref 30.0–36.0)
MCV: 92.1 fL (ref 80.0–100.0)
Monocytes Absolute: 0.4 10*3/uL (ref 0.1–1.0)
Monocytes Relative: 4 %
Neutro Abs: 8.3 10*3/uL — ABNORMAL HIGH (ref 1.7–7.7)
Neutrophils Relative %: 83 %
Platelets: 322 10*3/uL (ref 150–400)
RBC: 3.18 MIL/uL — ABNORMAL LOW (ref 3.87–5.11)
RDW: 21.6 % — ABNORMAL HIGH (ref 11.5–15.5)
WBC: 9.9 10*3/uL (ref 4.0–10.5)
nRBC: 0.3 % — ABNORMAL HIGH (ref 0.0–0.2)

## 2018-06-03 LAB — COMPREHENSIVE METABOLIC PANEL
ALT: 19 U/L (ref 0–44)
AST: 32 U/L (ref 15–41)
Albumin: 2.4 g/dL — ABNORMAL LOW (ref 3.5–5.0)
Alkaline Phosphatase: 150 U/L — ABNORMAL HIGH (ref 38–126)
Anion gap: 8 (ref 5–15)
BUN: 49 mg/dL — ABNORMAL HIGH (ref 8–23)
CO2: 30 mmol/L (ref 22–32)
Calcium: 7.9 mg/dL — ABNORMAL LOW (ref 8.9–10.3)
Chloride: 108 mmol/L (ref 98–111)
Creatinine, Ser: 1.02 mg/dL — ABNORMAL HIGH (ref 0.44–1.00)
GFR calc Af Amer: >60 mL/min (ref >60)
GFR calc non Af Amer: 57 mL/min — ABNORMAL LOW (ref >60)
Glucose, Bld: 119 mg/dL — ABNORMAL HIGH (ref 70–99)
Potassium: 4 mmol/L (ref 3.5–5.1)
Sodium: 146 mmol/L — ABNORMAL HIGH (ref 135–145)
Total Bilirubin: 1.2 mg/dL (ref 0.3–1.2)
Total Protein: 6.8 g/dL (ref 6.5–8.1)

## 2018-06-03 LAB — POCT I-STAT 7, (LYTES, BLD GAS, ICA,H+H)
Acid-Base Excess: 8 mmol/L — ABNORMAL HIGH (ref 0.0–2.0)
Bicarbonate: 32.4 mmol/L — ABNORMAL HIGH (ref 20.0–28.0)
Calcium, Ion: 1.13 mmol/L — ABNORMAL LOW (ref 1.15–1.40)
HCT: 27 % — ABNORMAL LOW (ref 36.0–46.0)
Hemoglobin: 9.2 g/dL — ABNORMAL LOW (ref 12.0–15.0)
O2 Saturation: 97 %
Patient temperature: 98.6
Potassium: 4.1 mmol/L (ref 3.5–5.1)
Sodium: 146 mmol/L — ABNORMAL HIGH (ref 135–145)
TCO2: 34 mmol/L — ABNORMAL HIGH (ref 22–32)
pCO2 arterial: 42.9 mmHg (ref 32.0–48.0)
pH, Arterial: 7.487 — ABNORMAL HIGH (ref 7.350–7.450)
pO2, Arterial: 84 mmHg (ref 83.0–108.0)

## 2018-06-03 LAB — GLUCOSE, CAPILLARY
Glucose-Capillary: 103 mg/dL — ABNORMAL HIGH (ref 70–99)
Glucose-Capillary: 118 mg/dL — ABNORMAL HIGH (ref 70–99)
Glucose-Capillary: 134 mg/dL — ABNORMAL HIGH (ref 70–99)
Glucose-Capillary: 120 mg/dL — ABNORMAL HIGH (ref 70–99)
Glucose-Capillary: 129 mg/dL — ABNORMAL HIGH (ref 70–99)

## 2018-06-03 LAB — URINALYSIS, ROUTINE W REFLEX MICROSCOPIC
Bacteria, UA: NONE SEEN
Bilirubin Urine: NEGATIVE
Glucose, UA: NEGATIVE mg/dL
Hgb urine dipstick: NEGATIVE
Ketones, ur: NEGATIVE mg/dL
Leukocytes,Ua: NEGATIVE
Nitrite: NEGATIVE
Protein, ur: 30 mg/dL — AB
Specific Gravity, Urine: 1.019 (ref 1.005–1.030)
pH: 8 (ref 5.0–8.0)

## 2018-06-03 MED ORDER — DEXTROSE 5 % IV SOLN
INTRAVENOUS | Status: AC
  Administered 2018-06-03: 09:00:00 via INTRAVENOUS

## 2018-06-03 MED ORDER — IPRATROPIUM BROMIDE 0.02 % IN SOLN: 0.5000 mg | Freq: Four times a day (QID) | RESPIRATORY_TRACT | Status: DC | PRN

## 2018-06-03 MED ORDER — CALCIUM GLUCONATE-NACL 1-0.675 GM/50ML-% IV SOLN
1.0000 g | Freq: Once | INTRAVENOUS | Status: AC
  Administered 2018-06-03: 1000 mg via INTRAVENOUS
  Filled 2018-06-03: qty 50

## 2018-06-03 MED ORDER — FUROSEMIDE 10 MG/ML IJ SOLN
40.0000 mg | Freq: Once | INTRAMUSCULAR | Status: AC
  Administered 2018-06-04: 40 mg via INTRAVENOUS
  Filled 2018-06-03: qty 4

## 2018-06-03 MED ORDER — LEVALBUTEROL HCL 0.63 MG/3ML IN NEBU: 0.6300 mg | INHALATION_SOLUTION | Freq: Four times a day (QID) | RESPIRATORY_TRACT | Status: DC | PRN

## 2018-06-03 MED ORDER — METOPROLOL SUCCINATE ER 25 MG PO TB24: 25.0000 mg | ORAL_TABLET | Freq: Every day | ORAL | Status: DC

## 2018-06-03 MED ORDER — METOPROLOL TARTRATE 25 MG PO TABS
12.5000 mg | ORAL_TABLET | Freq: Two times a day (BID) | ORAL | Status: DC
  Administered 2018-06-03 – 2018-06-14 (×22): 12.5 mg
  Filled 2018-06-03 (×24): qty 1

## 2018-06-03 NOTE — Progress Notes (Signed)
PROGRESS NOTE                                                                                                                                                                                                             Patient Demographics:    Shannon Carroll, is a 68 y.o. female, DOB - 1950-03-02, WLN:[id number]  Outpatient Primary MD for the patient is Harlan Stains, MD    LOS - 12  Chief Complaint  Patient presents with   Shortness of Breath       Brief Narrative  68 yr old female with PMHx HFpEF EF 60%, COPD, CVA, SVT, essential hypertension, obstructive sleep apnea, morbid obesity, who presented to the hospital with acute shortness of breath was diagnosed with COVID-19 infection, went into acute hypoxic respiratory failure intubated and kept in ICU by pulmonary critical care.  Transferred to [facility] covered ICU unit on 06/01/2018 under my care intubated.   Subjective:    Marvel Plan today is intubated and sedated   Assessment  & Plan :     1. Acute Hypoxic Resp. Failure due to Acute Covid 19 Viral Illness during the ongoing 2020 Covid 19 Pandemic - needed to be intubated, has finished treatment with hydroxychloroquine and azithromycin, continues to require endotracheal intubation with mechanical ventilation support, ARDS clinically seems to be improving, FiO2 requirements going down currently on 30%, CPAP trial started, tolerated it for a few hours on 06/02/2018, try to extubate in the next 1 to 2 days if remains hemodynamically stable and mentation is improved and able to follow commands, PCCM on board Case discussed with pulmonologist Dr. Lake Bells on 06/03/2018.  Vent Mode: PRVC FiO2 (%):  [40 %] 40 % Set Rate:  [16 bmp] 16 bmp Vt Set:  [510 mL] 510 mL PEEP:  [5 cmH20] 5 cmH20 Pressure Support:  [5 cmH20] 5 cmH20 Plateau Pressure:  [16 cmH20-22 cmH20] 20 cmH20  ABG    Component Value Date/Time   PHART 7.487 (H) 06/03/2018 0354   PCO2ART 42.9 06/03/2018 0354   PO2ART 84.0 06/03/2018 0354   HCO3 32.4 (H) 06/03/2018 0354   TCO2 34 (H) 06/03/2018 0354   O2SAT 97.0 06/03/2018 0354    2.  Septic shock.  Blood cultures negative to date presumed to be secondary to COVID-19 infection, initially required pressors through left arm PICC  line.  Now sepsis pathophysiology seems to have resolved.  3.  ARF.  Resolved.  4.  Iron deficiency anemia acute on chronic.  Some element of gradual blood loss due to repeated blood draws, have done type and screen, transfuse if hemoglobin drops below 7.  No signs of overt bleeding.  Continue PPI for prophylaxis.  5.  Hypokalemia.  Replaced and stable.  6.  Morbid obesity with underlying obstructive sleep apnea.  Outpatient follow-up with PCP.  7.  Nutrition.  Tube feedings via G-tube.  Free water flushes ordered.  8.  Hypertension.  Now getting intermittently hypotensive for the last 5 to 7 days prior to coming to [facility], cortisol levels are borderline could have subclinical renal insufficiency, for now with lowest dose Precedex blood pressure is stable, if it drops again will have low threshold of starting her on stress dose IV steroids.  9.  History of CVA.  Currently on Plavix, started on low-dose statin.  10. Dyslipidemia.  Home dose statin replaced.    11.  Short burst of SVT during CPAP trial on 06/02/2018.  Resolved, lowest dose beta-blocker at night, if blood pressure tolerates will increase beta-blocker dose, PRN IV Lopressor also on board. Recent TTE shows an EF of 65% no WMA.  12. Underlying Dementia, 2 recent CVAs - continues to be encephalopathic despite sedation cessation, question if she had another stroke, will check head CT on 06/04/2018 if mentation does not improve.  Will also check B12, folate, RPR.  TSH and cortisol were relatively stable.  Also discussed with son in detail on 06/03/2018.  13.  Low-grade temperatures for  15 2020.  Chest x-ray nonacute, check UA and urine along with blood cultures, appears nontoxic continue to monitor closely.    14. DM2 - ISS  CBG (last 3)  Recent Labs    06/03/18 0301 06/03/18 0836 06/03/18 1201  GLUCAP 103* 118* 134*      Nutrition.  Tube feedings via G-tube.  Free water flushes ordered.   Condition - Extremely Guarded  Family Communication  : updated son Brain 06/03/18 @ 1.50 pm  Code Status :  Partial with No CPR  Diet : TF  Disposition Plan  :  ICU  Consults  :  PCCM  Procedures  :    Endotracheal intubation- 05/23/2018 L.Arm PICC - 05/30/18  Blood cx 05/21/2018- NGTD Novel Coronavirus Positive result called 4/4. Tracheal aspirate 4/7>>> moderate WBC (abundant GPC and GNR)  MRSA 4/5> neg    DVT Prophylaxis  :  Lovenox    Lab Results  Component Value Date   PLT 322 06/03/2018    Inpatient Medications  Scheduled Meds:  atorvastatin  20 mg Per Tube q1800   budesonide (PULMICORT) nebulizer solution  0.25 mg Nebulization BID   chlorhexidine gluconate (MEDLINE KIT)  15 mL Mouth Rinse BID   Chlorhexidine Gluconate Cloth  6 each Topical Daily   clopidogrel  75 mg Per Tube Daily   docusate  50 mg Oral BID   enoxaparin (LOVENOX) injection  60 mg Subcutaneous Q24H   feeding supplement (JEVITY 1.2 CAL)  1,000 mL Per Tube Q24H   feeding supplement (PRO-STAT SUGAR FREE 64)  60 mL Oral TID   free water  200 mL Per Tube Q8H   insulin aspart  0-15 Units Subcutaneous Q4H   ipratropium  0.5 mg Nebulization Q6H   levalbuterol  0.63 mg Nebulization Q6H   mouth rinse  15 mL Mouth Rinse 10 times per day  metoprolol succinate  12.5 mg Oral QHS   pantoprazole sodium  40 mg Per Tube Daily   potassium chloride  40 mEq Per Tube Daily   sennosides  5 mL Oral QHS   silver sulfADIAZINE  1 application Topical Daily   sodium chloride flush  10-40 mL Intracatheter Q12H   sodium chloride flush  3 mL Intravenous Q12H   venlafaxine  37.5 mg  Per Tube QHS   venlafaxine  75 mg Per Tube q morning - 10a   Continuous Infusions:  calcium gluconate     dexmedetomidine (PRECEDEX) IV infusion Stopped (06/02/18 0800)   dextrose 10 mL/hr at 06/03/18 0917   PRN Meds:.acetaminophen, bisacodyl, fentaNYL (SUBLIMAZE) injection, metoprolol tartrate, midazolam, [DISCONTINUED] ondansetron **OR** ondansetron (ZOFRAN) IV, Resource ThickenUp Clear  Antibiotics  :    Anti-infectives (From admission, onward)   Start     Dose/Rate Route Frequency Ordered Stop   05/27/18 2200  ceFEPIme (MAXIPIME) 2 g in sodium chloride 0.9 % 100 mL IVPB  Status:  Discontinued     2 g 200 mL/hr over 30 Minutes Intravenous Every 8 hours 05/27/18 1322 05/27/18 1400   05/27/18 1200  ceFEPIme (MAXIPIME) 2 g in sodium chloride 0.9 % 100 mL IVPB     2 g 200 mL/hr over 30 Minutes Intravenous  Once 05/27/18 1153 05/27/18 1330   05/26/18 2200  azithromycin (ZITHROMAX) tablet 500 mg  Status:  Discontinued     500 mg Oral Daily 05/26/18 0715 05/26/18 1502   05/26/18 2200  azithromycin (ZITHROMAX) tablet 500 mg     500 mg Per Tube Daily 05/26/18 1502 05/27/18 2141   05/24/18 2200  hydroxychloroquine (PLAQUENIL) tablet 200 mg     200 mg Oral 2 times daily 05/23/18 2113 05/28/18 1025   05/23/18 2300  azithromycin (ZITHROMAX) 500 mg in sodium chloride 0.9 % 250 mL IVPB  Status:  Discontinued     500 mg 250 mL/hr over 60 Minutes Intravenous Every 24 hours 05/23/18 2250 05/26/18 0715   05/23/18 2200  hydroxychloroquine (PLAQUENIL) tablet 400 mg     400 mg Oral 2 times daily 05/23/18 2113 05/24/18 1114   05/23/18 1500  vancomycin (VANCOCIN) 1,250 mg in sodium chloride 0.9 % 250 mL IVPB  Status:  Discontinued     1,250 mg 166.7 mL/hr over 90 Minutes Intravenous Every 24 hours 06/08/2018 1532 05/25/18 1141   05/23/18 0230  ceFEPIme (MAXIPIME) 2 g in sodium chloride 0.9 % 100 mL IVPB  Status:  Discontinued     2 g 200 mL/hr over 30 Minutes Intravenous Every 12 hours 06/12/2018 1532  05/25/18 1334   06/14/2018 1545  metroNIDAZOLE (FLAGYL) IVPB 500 mg     500 mg 100 mL/hr over 60 Minutes Intravenous  Once 06/08/2018 1531 06/11/2018 1731   06/04/2018 1530  vancomycin (VANCOCIN) IVPB 1000 mg/200 mL premix     1,000 mg 200 mL/hr over 60 Minutes Intravenous  Once 05/26/2018 1444 06/12/2018 1731   05/28/2018 1415  vancomycin (VANCOCIN) IVPB 1000 mg/200 mL premix     1,000 mg 200 mL/hr over 60 Minutes Intravenous  Once 06/12/2018 1401 05/24/2018 1538   06/14/2018 1415  ceFEPIme (MAXIPIME) 2 g in sodium chloride 0.9 % 100 mL IVPB     2 g 200 mL/hr over 30 Minutes Intravenous  Once 05/24/2018 1401 05/21/2018 1509       Time Spent in minutes  [place] M.D on 06/03/2018 at 1:49 PM  To page go to [email or web address] -  password [email or web address]  [phone]  Total Critical Care time in examining the patient bedside, evaluating Lab work and other data, over half of the total time was spent in coordinating patient care on the floor or bedisde in talking to patient/family members, communicating with nursing Staff on the floor and sub specialists PCCM  to coordinate patients medical care and needs is  35 Minutes.   The condition which has caused critical injury/acute impairment of Pulmonary vital organ system with a high probability of sudden clinically significant deterioration and can cause Potential Life threatening injury to this patient addressed today is  Acute Hypoxic Resp failure.  See all Orders from today for further details   Admit date - 06/07/2018    12    Objective:   Vitals:   06/03/18 1000 06/03/18 1100 06/03/18 1200 06/03/18 1300  BP: 112/80 104/77 103/72 114/89  Pulse: 90 95 85 89  Resp: (!) 28 (!) 30 (!) 22 15  Temp:    (!) 100.7 F (38.2 C)  TempSrc:    Oral  SpO2: 100% 100% 100% 100%  Weight:      Height:        Wt Readings from Last 3 Encounters:  06/03/18 126.5 kg  05/19/18 131.9 kg  05/04/18 127 kg     Intake/Output Summary (Last 24  hours) at 06/03/2018 1349 Last data filed at 06/03/2018 1300 Gross per 24 hour  Intake 715.88 ml  Output 1455 ml  Net -739.12 ml     Physical Exam  Intubated, mildly sedated, opens eyes to verbal commands, moves all 4 extremities to painful stimuli,  ETT,OG, Foley, L arm PICC [place].AT,PERRAL Supple Neck,No JVD, No cervical lymphadenopathy appriciated.  Symmetrical Chest wall movement, Good air movement bilaterally, CTAB RRR,No Gallops, Rubs or new Murmurs, No Parasternal Heave +ve B.Sounds, Abd Soft, No tenderness, No organomegaly appriciated, No rebound - guarding or rigidity. No Cyanosis, Clubbing or edema, No new Rash or bruise    Data Review:    CBC Recent Labs  Lab 05/30/18 0327 05/31/18 0521  06/01/18 0406 06/01/18 1854 06/02/18 0216 06/02/18 0500 06/03/18 0354 06/03/18 0435  WBC 7.9 7.9  --  8.3  --   --  7.3  --  9.9  HGB 8.2* 7.4*   < > 7.5* 8.8* 11.6* 7.4* 9.2* 8.1*  HCT 29.8* 27.2*   < > 26.5* 26.0* 34.0* 26.9* 27.0* 29.3*  PLT 278 277  --  265  --   --  264  --  322  MCV 88.4 91.3  --  89.8  --   --  92.1  --  92.1  MCH 24.3* 24.8*  --  25.4*  --   --  25.3*  --  25.5*  MCHC 27.5* 27.2*  --  28.3*  --   --  27.5*  --  27.6*  RDW 19.2* 19.7*  --  20.4*  --   --  20.8*  --  21.6*  LYMPHSABS  --   --   --   --   --   --  1.1  --  1.1  MONOABS  --   --   --   --   --   --  0.3  --  0.4  EOSABS  --   --   --   --   --   --  0.1  --  0.0  BASOSABS  --   --   --   --   --   --  0.0  --  0.1   < > = values in this interval not displayed.    Chemistries  Recent Labs  Lab 05/28/18 0338 05/29/18 1053  05/30/18 0327 05/31/18 0521  06/01/18 0406 06/01/18 1854 06/02/18 0216 06/02/18 0500 06/03/18 0354 06/03/18 0435  NA 145 143   < > 144 145   < > 146* 145 145 145 146* 146*  K 3.8 4.3   < > 3.5 3.4*   < > 3.0* 4.3 4.3 4.3 4.1 4.0  CL 99 96*  --  98 100  --  101 104  --  107  --  108  CO2 36* 34*  --  34* 35*  --  32  --   --  31  --  30  GLUCOSE 121* 191*   --  125* 111*  --  161* 142*  --  131*  --  119*  BUN 32* 36*  --  34* 32*  --  32* 38*  --  44*  --  49*  CREATININE 1.01* 1.08*  --  0.87 0.87  --  1.04* 0.90  --  0.97  --  1.02*  CALCIUM 8.4* 8.6*  --  8.2* 8.2*  --  8.4*  --   --  8.0*  --  7.9*  MG 2.2 2.4  --   --   --   --   --   --   --  2.5*  --   --   AST  --   --   --  37 31  --  31  --   --  31  --  32  ALT  --   --   --  21 21  --  21  --   --  20  --  19  ALKPHOS  --   --   --  87 93  --  96  --   --  135*  --  150*  BILITOT  --   --   --  1.3* 1.0  --  1.2  --   --  1.3*  --  1.2   < > = values in this interval not displayed.   ------------------------------------------------------------------------------------------------------------------ No results for input(s): CHOL, HDL, LDLCALC, TRIG, CHOLHDL, LDLDIRECT in the last 72 hours.  Lab Results  Component Value Date   HGBA1C 5.2 03/02/2018   ------------------------------------------------------------------------------------------------------------------ Recent Labs    06/02/18 0500  TSH 2.243   ------------------------------------------------------------------------------------------------------------------ No results for input(s): VITAMINB12, FOLATE, FERRITIN, TIBC, IRON, RETICCTPCT in the last 72 hours.  Coagulation profile No results for input(s): INR, PROTIME in the last 168 hours.  Recent Labs    06/02/18 0500  DDIMER 2.13*    Cardiac Enzymes Recent Labs  Lab 05/30/18 0327 05/31/18 0521 06/01/18 0406  TROPONINI 0.03* 0.03* <0.03   ------------------------------------------------------------------------------------------------------------------    Component Value Date/Time   BNP 2,117.1 (H) 06/10/2018 1402    Micro Results Recent Results (from the past 240 hour(s))  Culture, respiratory (non-expectorated)     Status: None   Collection Time: 05/26/18  9:35 AM  Result Value Ref Range Status   Specimen Description TRACHEAL ASPIRATE  Final    Special Requests NONE  Final   Gram Stain   Final    MODERATE WBC PRESENT,BOTH PMN AND MONONUCLEAR FEW SQUAMOUS EPITHELIAL CELLS PRESENT ABUNDANT GRAM POSITIVE COCCI ABUNDANT GRAM NEGATIVE RODS FEW GRAM POSITIVE RODS    Culture   Final    MODERATE Consistent with normal  respiratory flora. Performed at [facility], [place] [address] [place], [place] [address]    Report Status 05/28/2018 FINAL  Final    Radiology Reports  Ct Chest Wo Contrast  Result Date: 06/18/2018 CLINICAL DATA:  68 year old female with shortness of breath, fever. Patient also has a history of congestive heart failure. EXAM: CT CHEST WITHOUT CONTRAST TECHNIQUE: Multidetector CT imaging of the chest was performed following the standard protocol without IV contrast. COMPARISON:  Chest x-ray obtained earlier today; prior chest x-ray 05/19/18 FINDINGS: Cardiovascular: Limited evaluation in the absence of intravenous contrast. Cardiomegaly. Calcification of the mitral valve annulus. Coronary artery calcifications also present. Atherosclerotic calcifications throughout the aorta. No pericardial effusion. Mediastinum/Nodes: Thyroid gland is unremarkable. Borderline enlarged mediastinal lymph nodes. Index right paratracheal lymph node measures 1.2 cm in short axis. Index low right paratracheal lymph node measures 1.2 cm in short axis. Unremarkable thoracic esophagus. Lungs/Pleura: Extensive patchy ground-glass and more consolidative airspace opacities in a peripheral and peribronchovascular distribution throughout all lobes of both lungs but preferentially within the upper lobes. There is relative sparing of the middle lobe. Somewhat less significant disease within the dependent portions of the right lower lobe makes aspiration less likely. Additionally, there is mild bilateral lower lobe atelectasis secondary to the presence of small bilateral pleural effusions. Upper Abdomen: Small volume perihepatic ascites. Relative right renal  atrophy. Musculoskeletal: No acute fracture or aggressive appearing lytic or blastic osseous lesion. IMPRESSION: 1. There are a spectrum of findings in the lungs which can be seen with acute atypical infection (as well as other non-infectious etiologies). In particular, viral pneumonia (including COVID-19) should be considered in the appropriate clinical setting. Given the upper lung predominant pattern of distribution, aspiration is considered significantly less likely despite the patient's underlying risk factors. Similarly, congestive heart failure is considered less likely given the absence of interlobular septal thickening. 2. Small to moderate bilateral layering pleural effusions. 3. Small volume perihepatic site ease of uncertain etiology. 4. Borderline enlarged mediastinal lymph nodes are favored to be reactive. 5. Coronary artery calcifications. 6.  Aortic Atherosclerosis (ICD10-170.0). 7. Relative right renal atrophy. These results were called by telephone at the time of interpretation on 06/11/2018 at 4:42 pm to Dr. Evangeline Gula, who verbally acknowledged these results. Electronically Signed   By: Jacqulynn Cadet M.D.   On: 06/05/2018 16:43   Dg Chest Port 1 View  Result Date: 06/02/2018 CLINICAL DATA:  Short of breath EXAM: PORTABLE CHEST 1 VIEW COMPARISON:  05/30/2018 FINDINGS: Endotracheal and NG tubes are stable. Left PICC placed. Tip is at the cavoatrial junction. Diffuse airspace disease and bilateral pleural effusions are stable. Cardiac silhouette is prominent likely due to AP technique. No pneumothorax. IMPRESSION: Stable diffuse bilateral airspace disease and bilateral pleural effusions. Left PICC placed.  Tip is at the cavoatrial junction. Electronically Signed   By: Marybelle Killings M.D.   On: 06/02/2018 07:40   Dg Chest Port 1 View  Result Date: 05/30/2018 CLINICAL DATA:  ARDS EXAM: PORTABLE CHEST 1 VIEW COMPARISON:  05/28/2018 FINDINGS: Endotracheal tube in good position.  NG tube in the  stomach. Cardiac enlargement. Extensive diffuse bilateral airspace disease unchanged. Bilateral pleural effusions and bibasilar atelectasis unchanged. IMPRESSION: Diffuse bilateral airspace disease and bilateral effusions unchanged. Electronically Signed   By: Franchot Gallo M.D.   On: 05/30/2018 08:14   Dg Chest Port 1 View  Result Date: 05/28/2018 CLINICAL DATA:  68 year old female with ARDS EXAM: PORTABLE CHEST 1 VIEW COMPARISON:  Multiple prior most recent 05/27/2018,  05/23/2018, CT 06/16/2018 FINDINGS: Cardiomediastinal silhouette unchanged in size and contour, with cardiomegaly. Endotracheal tube unchanged terminating 2.7 cm above the carina. Unchanged gastric tube terminating out of the field of view. Low lung volumes with veil opacities at the bilateral lung bases obscuring the hemidiaphragm. Patchy airspace and interstitial opacities bilaterally, similar to the comparison. No pneumothorax. IMPRESSION: Unchanged endotracheal tube and gastric tube. Similar appearance of mixed interstitial and airspace opacities of the bilateral lungs, with bibasilar pleural effusions. Electronically Signed   By: Corrie Mckusick D.O.   On: 05/28/2018 07:53   Dg Chest Port 1 View  Result Date: 05/27/2018 CLINICAL DATA:  68 year old female with respiratory failure EXAM: PORTABLE CHEST 1 VIEW COMPARISON:  05/23/2018, 06/16/2018, 05/19/2018 FINDINGS: Cardiomediastinal silhouette unchanged in size and contour. Endotracheal tube terminates 2.7 cm above the carina. Unchanged gastric tube, with the tip terminating just below the diaphragm. No pneumothorax. Worsening airspace opacity in the left lung and right mid lung with veil opacity at the lung bases obscuring the hemidiaphragms and heart borders. Interval removal of the defibrillator pads. IMPRESSION: Unchanged endotracheal tube and gastric tube. Persisting interstitial and airspace opacity of the lungs, worsening on the left. Small bilateral pleural effusions and associated  atelectasis/consolidation. Electronically Signed   By: Corrie Mckusick D.O.   On: 05/27/2018 08:02   Dg Chest Port 1 View  Result Date: 05/23/2018 CLINICAL DATA:  Intubation EXAM: PORTABLE CHEST 1 VIEW COMPARISON:  05/31/2018 FINDINGS: Endotracheal tube tip is 3 cm above the carina. Orogastric or nasogastric tube enters the abdomen. The tip may be just beyond the gastroesophageal junction. Widespread bilateral pulmonary infiltrates persist. No worsening or new finding by radiography. IMPRESSION: Endotracheal tube 3 cm above the carina. Orogastric or nasogastric tube enters the abdomen, just beyond the gastroesophageal junction. Widespread bilateral infiltrates persist. Electronically Signed   By: Nelson Chimes M.D.   On: 05/23/2018 21:38   Dg Chest Port 1 View  Result Date: 06/11/2018 CLINICAL DATA:  Shortness of breath since yesterday. Tachycardia. Former smoker. EXAM: PORTABLE CHEST 1 VIEW COMPARISON:  05/19/2018 FINDINGS: Lungs are adequately inflated and demonstrate worsening of bilateral multifocal airspace opacification which is most prominent over the right upper lobe and left perihilar region. Possible small amount left pleural fluid unchanged. Mild stable cardiomegaly. Remainder the exam is unchanged. IMPRESSION: Interval worsening bilateral multifocal airspace process most prominent over the right upper lobe and left perihilar region likely multifocal pneumonia. Small amount left pleural fluid unchanged. Mild stable cardiomegaly. Electronically Signed   By: Marin Olp M.D.   On: 06/03/2018 14:29   Dg Chest Port 1 View  Result Date: 05/19/2018 CLINICAL DATA:  Respiratory failure and congestive heart failure. EXAM: PORTABLE CHEST 1 VIEW COMPARISON:  05/18/2018 FINDINGS: Stable cardiac enlargement. Slight diminishment in CHF since the prior study. There is some asymmetric vascular congestion versus atelectasis in the right upper lung. Stable left basilar atelectasis and probable component of small  to moderate left pleural effusion. IMPRESSION: Decreased prominence of CHF since the prior chest x-ray. Asymmetric vascular congestion versus atelectasis in the right upper lung. Left basilar atelectasis with probable component of left pleural fluid. Electronically Signed   By: Aletta Edouard M.D.   On: 05/19/2018 08:27   Dg Chest Port 1 View  Result Date: 05/18/2018 CLINICAL DATA:  Short of breath EXAM: PORTABLE CHEST 1 VIEW COMPARISON:  05/17/2018 FINDINGS: Stable enlarged cardiac silhouette. Bilateral pleural effusions similar prior. A central venous congestion. No focal consolidation. No pneumothorax. IMPRESSION: Cardiomegaly with small bilateral pleural  effusions. Concern for mild congestive heart failure Electronically Signed   By: Suzy Bouchard M.D.   On: 05/18/2018 08:16   Dg Chest Port 1 View  Result Date: 05/17/2018 CLINICAL DATA:  Shortness of breath EXAM: PORTABLE CHEST 1 VIEW COMPARISON:  05/16/2018 FINDINGS: Cardiomegaly with mild perihilar edema and small bilateral pleural effusions, grossly unchanged. No pneumothorax. IMPRESSION: Cardiomegaly mild interstitial edema and small bilateral pleural effusions, grossly unchanged. Electronically Signed   By: Julian Hy M.D.   On: 05/17/2018 08:11   Dg Chest Port 1 View  Result Date: 05/16/2018 CLINICAL DATA:  Shortness of breath EXAM: PORTABLE CHEST 1 VIEW COMPARISON:  05/15/2018 FINDINGS: Cardiomegaly with mild perihilar edema. Small to moderate bilateral pleural effusions, increased. No pneumothorax. IMPRESSION: Cardiomegaly with mild perihilar edema and small to moderate bilateral pleural effusions, increased. Electronically Signed   By: Julian Hy M.D.   On: 05/16/2018 07:41   Dg Chest Port 1 View  Result Date: 05/15/2018 CLINICAL DATA:  Shortness of breath EXAM: PORTABLE CHEST 1 VIEW COMPARISON:  May 14, 2018 FINDINGS: There is persistent cardiomegaly. Pulmonary vascularity is within normal limits. Currently there is  no appreciable edema or consolidation. No adenopathy. No bone lesions. There is aortic atherosclerosis. IMPRESSION: Cardiomegaly. No edema or consolidation. Pulmonary vascularity within normal limits. Aortic Atherosclerosis (ICD10-I70.0). Electronically Signed   By: Lowella Grip III M.D.   On: 05/15/2018 07:35   Dg Chest Port 1 View  Result Date: 05/14/2018 CLINICAL DATA:  Aspiration EXAM: PORTABLE CHEST 1 VIEW COMPARISON:  May 14, 2018 study obtained earlier in the day FINDINGS: There is cardiomegaly with mild pulmonary venous hypertension. There are pleural effusions bilaterally. There is no appreciable edema or consolidation. There is aortic atherosclerosis. No adenopathy. No bone lesions. IMPRESSION: Pulmonary vascular congestion with small pleural effusions bilaterally. No frank edema or consolidation. Aortic Atherosclerosis (ICD10-I70.0). Electronically Signed   By: Lowella Grip III M.D.   On: 05/14/2018 16:00   Dg Chest Port 1 View  Result Date: 05/14/2018 CLINICAL DATA:  Short of breath EXAM: PORTABLE CHEST 1 VIEW COMPARISON:  05/06/2018 FINDINGS: Cardiac enlargement with mild progression of vascular congestion. Progression of bibasilar atelectasis and small effusions. Atherosclerotic ascending aorta and aortic arch. IMPRESSION: Congestive heart failure with mild progression. Electronically Signed   By: Franchot Gallo M.D.   On: 05/14/2018 08:31   Dg Chest Port 1 View  Result Date: 05/06/2018 CLINICAL DATA:  Dyspnea. Bilateral leg swelling. EXAM: PORTABLE CHEST 1 VIEW COMPARISON:  01/21/2018. FINDINGS: Poor inspiration. No gross change in borderline enlargement of the cardiac silhouette and mild prominence of the pulmonary vasculature and interstitial markings. No visible pleural fluid. Thoracic spine degenerative changes. IMPRESSION: Stable borderline cardiomegaly, mild pulmonary vascular congestion and mild chronic interstitial lung disease. Electronically Signed   By: Claudie Revering  M.D.   On: 05/06/2018 18:07   [place] Ekg Site Rite  Result Date: 05/30/2018 If Site Rite image not attached, placement could not be confirmed due to current cardiac rhythm.  [place] Ekg Site Rite  Result Date: 05/30/2018 If Site Rite image not attached, placement could not be confirmed due to current cardiac rhythm.  US Abdomen Limited Ruq  Result Date: 05/14/2018 CLINICAL DATA:  Elevated LFTs. EXAM: ULTRASOUND ABDOMEN LIMITED RIGHT UPPER QUADRANT COMPARISON:  None. FINDINGS: Gallbladder: Physiologically distended. No gallstones. No significant gallbladder wall thickening. No sonographic Murphy sign noted by sonographer. Common bile duct: Diameter: 5 mm. Liver: No focal lesion identified. Diffusely heterogeneous and slightly increased in parenchymal echogenicity. Portal  vein is patent on color Doppler imaging with normal direction of blood flow towards the liver. Small amount of right upper quadrant ascites. Right pleural effusion noted. IMPRESSION: 1. Mild hepatic steatosis. 2. No gallstones.  No biliary dilatation. 3. Small volume of ascites.  Right pleural effusion. Electronically Signed   By: Keith Rake M.D.   On: 05/14/2018 22:18

## 2018-06-03 NOTE — Progress Notes (Signed)
NAME:  Shannon Carroll, MRN:  [id number], DOB:  [birth year], LOS: 12 ADMISSION DATE:  05/26/2018, CONSULTATION DATE:  05/23/2018 REFERRING MD:  Glenna Durand, CHIEF COMPLAINT:  SVT hpoxemia, COVID 59 POS   Brief History   68 yr old female with PMHx HFpEF, COPD, w/ bilateral infiltrates COVID positive on supplemental oxygen 8L with increased work of breathing, fatigue, and diaphoretic. PCCM consulted for acute respiratory distress.  Significant Hospital Events   4/3 + COVID -19  4/4 ICU admit intubated  4/12 weaning trial, failed, de-sat, SVT  4/15 episodes of SVT  Consults:  PCCM   Procedures:  Endotracheal intubation- 05/23/2018  Significant Diagnostic Tests:  Bilateral alveolar infiltrates on CXR -CXR 4/8 with worsening LLL and RML opacity   Micro Data:  Blood cx 05/21/2018- NGTD Novel Coronavirus Positive result called 4/4. Tracheal aspirate 4/7>>> moderate WBC (abundant GPC and GNR)  MRSA 4/5> neg   Antimicrobials:  Vancomycin- 05/23/2018 Cefepime- 05/23/2018-4/6, 4/8>>  Hydroxychloroquine- upon transfer to ICU -COVID (to end 4/9) Axithromycin - upon transfer to ICU- COVID (to end 4/8)  Interim history/subjective:  On pressure support ventilation had some nasal flaring with 5/5, better with 10/5, however, developed SVT requiring metoprolol.  Objective   Blood pressure 103/72, pulse 85, temperature 99.8 F (37.7 C), temperature source Oral, resp. rate (!) 22, height 5\' 8"  (1.727 m), weight 126.5 kg, SpO2 100 %.    Vent Mode: PRVC FiO2 (%):  [40 %] 40 % Set Rate:  [16 bmp] 16 bmp Vt Set:  [510 mL] 510 mL PEEP:  [5 cmH20] 5 cmH20 Pressure Support:  [5 cmH20] 5 cmH20 Plateau Pressure:  [16 cmH20-22 cmH20] 20 cmH20   Intake/Output Summary (Last 24 hours) at 06/03/2018 1236 Last data filed at 06/03/2018 1200 Gross per 24 hour  Intake 745.88 ml  Output 1500 ml  Net -754.12 ml   Filed Weights   06/01/18 0500 06/02/18 0330 06/03/18 0426  Weight: 123.2 kg 125 kg 126.5 kg     Examination:   General:  In bed on vent HENT: NCAT ETT in place PULM: CTA B, vent supported breathing CV: RRR, no mgr GI: BS+, soft, nontender MSK: normal bulk and tone Neuro: awake, doesn't follow commands     Resolved Hospital Problem list   Transaminits.  Septic shock  Assessment & Plan:   Acute Hypoxic Respiratory Failure on mechanical ventilation  -ARDS secondary to  COVID-19 viral pneumonia Continue diuresis Hopeful for extubation next 24 hours Continue pressure support ventilation during the daytime Continue ventilator associated pneumonia prevention protocol   Baseline HFpEF > Diurese as able  COPD: > Continue Xopenex/Atrovent and Pulmicort nebulized  Acute toxic metabolic delirium > Minimize sedation Frequent orientation  Best practice:  Diet:tube feed Pain/Anxiety/Delirium protocol (if indicated): Continuous infusion VAP protocol (if indicated): NA DVT prophylaxis: SCD, Lovenox GI prophylaxis: Protonix Glucose control: sliding scale Mobility: bed bound Code Status: Partial code - REMAINS NO CPR, but if [place] OK IF WE PUT BACK ETT Family Communication: I spoke with patients son Disposition: ICU   Labs   CBC: Recent Labs  Lab 05/30/18 0327 05/31/18 0521  06/01/18 0406 06/01/18 1854 06/02/18 0216 06/02/18 0500 06/03/18 0354 06/03/18 0435  WBC 7.9 7.9  --  8.3  --   --  7.3  --  9.9  NEUTROABS  --   --   --   --   --   --  5.8  --  8.3*  HGB 8.2* 7.4*   < >  7.5* 8.8* 11.6* 7.4* 9.2* 8.1*  HCT 29.8* 27.2*   < > 26.5* 26.0* 34.0* 26.9* 27.0* 29.3*  MCV 88.4 91.3  --  89.8  --   --  92.1  --  92.1  PLT 278 277  --  265  --   --  264  --  322   < > = values in this interval not displayed.    Basic Metabolic Panel: Recent Labs  Lab 05/28/18 0338 05/29/18 1053  05/30/18 0327 05/31/18 0521  06/01/18 0406 06/01/18 1854 06/02/18 0216 06/02/18 0500 06/03/18 0354 06/03/18 0435  NA 145 143   < > 144 145   < > 146* 145  145 145 146* 146*  K 3.8 4.3   < > 3.5 3.4*   < > 3.0* 4.3 4.3 4.3 4.1 4.0  CL 99 96*  --  98 100  --  101 104  --  107  --  108  CO2 36* 34*  --  34* 35*  --  32  --   --  31  --  30  GLUCOSE 121* 191*  --  125* 111*  --  161* 142*  --  131*  --  119*  BUN 32* 36*  --  34* 32*  --  32* 38*  --  44*  --  49*  CREATININE 1.01* 1.08*  --  0.87 0.87  --  1.04* 0.90  --  0.97  --  1.02*  CALCIUM 8.4* 8.6*  --  8.2* 8.2*  --  8.4*  --   --  8.0*  --  7.9*  MG 2.2 2.4  --   --   --   --   --   --   --  2.5*  --   --   PHOS  --  4.4  --   --   --   --   --   --   --   --   --   --    < > = values in this interval not displayed.   GFR: Estimated Creatinine Clearance: 75.1 mL/min (A) (by C-G formula based on SCr of 1.02 mg/dL (H)). Recent Labs  Lab 05/30/18 0327 05/31/18 0521 05/31/18 0523 06/01/18 0406 06/02/18 0500 06/03/18 0435  WBC 7.9 7.9  --  8.3 7.3 9.9  LATICACIDVEN 1.1  --  0.8 1.0  --   --     Liver Function Tests: Recent Labs  Lab 05/30/18 0327 05/31/18 0521 06/01/18 0406 06/02/18 0500 06/03/18 0435  AST 37 31 31 31  32  ALT 21 21 21 20 19   ALKPHOS 87 93 96 135* 150*  BILITOT 1.3* 1.0 1.2 1.3* 1.2  PROT 6.5 5.8* 6.0* 6.4* 6.8  ALBUMIN 2.0* 1.8* 2.0* 2.3* 2.4*   No results for input(s): LIPASE, AMYLASE in the last 168 hours. No results for input(s): AMMONIA in the last 168 hours.  ABG    Component Value Date/Time   PHART 7.487 (H) 06/03/2018 0354   PCO2ART 42.9 06/03/2018 0354   PO2ART 84.0 06/03/2018 0354   HCO3 32.4 (H) 06/03/2018 0354   TCO2 34 (H) 06/03/2018 0354   O2SAT 97.0 06/03/2018 0354     Coagulation Profile: No results for input(s): INR, PROTIME in the last 168 hours.  Cardiac Enzymes: Recent Labs  Lab 05/30/18 0327 05/31/18 0521 06/01/18 0406  TROPONINI 0.03* 0.03* <0.03    HbA1C: Hgb A1c MFr Bld  Date/Time Value Ref Range Status  03/02/2018 04:43  PM 5.2 4.8 - 5.6 % Final    Comment:             Prediabetes: 5.7 - 6.4           Diabetes: >6.4          Glycemic control for adults with diabetes: <7.0     CBG: Recent Labs  Lab 06/02/18 1947 06/02/18 2315 06/03/18 0301 06/03/18 0836 06/03/18 1201  GLUCAP 126* 135* 103* 118* 134*    This patient is critically ill with multiple organ system failure; which, requires frequent high complexity decision making, assessment, support, evaluation, and titration of therapies. This was completed through the application of advanced monitoring technologies and extensive interpretation of multiple databases. During this encounter critical care time was devoted to patient care services described in this note for 35 minutes.    Roselie Awkward, MD [facility] Pager: [phone] Cell: [phone] If no response, call [phone]

## 2018-06-03 NOTE — Progress Notes (Signed)
Trying to call son Shannon Carroll through face time. Responded "sorry, I am driving. Son Shannon Carroll called, and had talk to mom face to face. Was told to update father and brother and who ever else needed to be updated. Pt seems responding to voice

## 2018-06-03 NOTE — Progress Notes (Signed)
Pt had 4 runs of SVT last night with HR in 160's-170s. Was able to come down twice on her own, and required metoprolol tartrate x2 during the shift for rapid HR.

## 2018-06-04 ENCOUNTER — Inpatient Hospital Stay (HOSPITAL_COMMUNITY): Payer: Medicare Other

## 2018-06-04 ENCOUNTER — Encounter (HOSPITAL_COMMUNITY): Payer: Medicare Other

## 2018-06-04 DIAGNOSIS — J9620 Acute and chronic respiratory failure, unspecified whether with hypoxia or hypercapnia: Secondary | ICD-10-CM

## 2018-06-04 DIAGNOSIS — J80 Acute respiratory distress syndrome: Secondary | ICD-10-CM | POA: Diagnosis present

## 2018-06-04 LAB — CBC WITH DIFFERENTIAL/PLATELET
Abs Immature Granulocytes: 0.05 10*3/uL (ref 0.00–0.07)
Basophils Absolute: 0.1 10*3/uL (ref 0.0–0.1)
Basophils Relative: 1 %
Eosinophils Absolute: 0 10*3/uL (ref 0.0–0.5)
Eosinophils Relative: 0 %
HCT: 28.6 % — ABNORMAL LOW (ref 36.0–46.0)
Hemoglobin: 8 g/dL — ABNORMAL LOW (ref 12.0–15.0)
Immature Granulocytes: 1 %
Lymphocytes Relative: 14 %
Lymphs Abs: 1.2 10*3/uL (ref 0.7–4.0)
MCH: 25.8 pg — ABNORMAL LOW (ref 26.0–34.0)
MCHC: 28 g/dL — ABNORMAL LOW (ref 30.0–36.0)
MCV: 92.3 fL (ref 80.0–100.0)
Monocytes Absolute: 0.3 10*3/uL (ref 0.1–1.0)
Monocytes Relative: 4 %
Neutro Abs: 7.1 10*3/uL (ref 1.7–7.7)
Neutrophils Relative %: 80 %
Platelets: 305 10*3/uL (ref 150–400)
RBC: 3.1 MIL/uL — ABNORMAL LOW (ref 3.87–5.11)
RDW: 21.6 % — ABNORMAL HIGH (ref 11.5–15.5)
WBC: 8.8 10*3/uL (ref 4.0–10.5)
nRBC: 0 % (ref 0.0–0.2)

## 2018-06-04 LAB — POCT I-STAT 7, (LYTES, BLD GAS, ICA,H+H)
Acid-Base Excess: 9 mmol/L — ABNORMAL HIGH (ref 0.0–2.0)
Bicarbonate: 33.6 mmol/L — ABNORMAL HIGH (ref 20.0–28.0)
Calcium, Ion: 1.16 mmol/L (ref 1.15–1.40)
HCT: 25 % — ABNORMAL LOW (ref 36.0–46.0)
Hemoglobin: 8.5 g/dL — ABNORMAL LOW (ref 12.0–15.0)
O2 Saturation: 98 %
Patient temperature: 100.5
Potassium: 4.2 mmol/L (ref 3.5–5.1)
Sodium: 147 mmol/L — ABNORMAL HIGH (ref 135–145)
TCO2: 35 mmol/L — ABNORMAL HIGH (ref 22–32)
pCO2 arterial: 50.9 mmHg — ABNORMAL HIGH (ref 32.0–48.0)
pH, Arterial: 7.431 (ref 7.350–7.450)
pO2, Arterial: 107 mmHg (ref 83.0–108.0)

## 2018-06-04 LAB — GLUCOSE, CAPILLARY
Glucose-Capillary: 107 mg/dL — ABNORMAL HIGH (ref 70–99)
Glucose-Capillary: 116 mg/dL — ABNORMAL HIGH (ref 70–99)
Glucose-Capillary: 118 mg/dL — ABNORMAL HIGH (ref 70–99)
Glucose-Capillary: 123 mg/dL — ABNORMAL HIGH (ref 70–99)
Glucose-Capillary: 130 mg/dL — ABNORMAL HIGH (ref 70–99)
Glucose-Capillary: 97 mg/dL (ref 70–99)

## 2018-06-04 LAB — COMPREHENSIVE METABOLIC PANEL
ALT: 20 U/L (ref 0–44)
AST: 31 U/L (ref 15–41)
Albumin: 2.4 g/dL — ABNORMAL LOW (ref 3.5–5.0)
Alkaline Phosphatase: 137 U/L — ABNORMAL HIGH (ref 38–126)
Anion gap: 7 (ref 5–15)
BUN: 49 mg/dL — ABNORMAL HIGH (ref 8–23)
CO2: 31 mmol/L (ref 22–32)
Calcium: 8.1 mg/dL — ABNORMAL LOW (ref 8.9–10.3)
Chloride: 108 mmol/L (ref 98–111)
Creatinine, Ser: 1.04 mg/dL — ABNORMAL HIGH (ref 0.44–1.00)
GFR calc Af Amer: >60 mL/min (ref >60)
GFR calc non Af Amer: 56 mL/min — ABNORMAL LOW (ref >60)
Glucose, Bld: 129 mg/dL — ABNORMAL HIGH (ref 70–99)
Potassium: 4 mmol/L (ref 3.5–5.1)
Sodium: 146 mmol/L — ABNORMAL HIGH (ref 135–145)
Total Bilirubin: 1.2 mg/dL (ref 0.3–1.2)
Total Protein: 6.8 g/dL (ref 6.5–8.1)

## 2018-06-04 LAB — PROTIME-INR
INR: 1.4 — ABNORMAL HIGH (ref 0.8–1.2)
Prothrombin Time: 17 seconds — ABNORMAL HIGH (ref 11.4–15.2)

## 2018-06-04 LAB — D-DIMER, QUANTITATIVE: D-Dimer, Quant: 2.68 ug/mL-FEU — ABNORMAL HIGH (ref 0.00–0.50)

## 2018-06-04 LAB — URINE CULTURE: Culture: NO GROWTH

## 2018-06-04 LAB — VITAMIN B12: Vitamin B-12: 969 pg/mL — ABNORMAL HIGH (ref 180–914)

## 2018-06-04 LAB — C-REACTIVE PROTEIN: CRP: 7.5 mg/dL — ABNORMAL HIGH (ref <1.0)

## 2018-06-04 NOTE — Progress Notes (Signed)
Spoke with patients son Mitzi Hansen to update him.

## 2018-06-04 NOTE — Progress Notes (Signed)
PROGRESS NOTE                                                                                                                                                                                                             Patient Demographics:    Shannon Carroll, is a 68 y.o. female, DOB - May 23, 1950, DGL:[id number]  Outpatient Primary MD for the patient is Harlan Stains, MD    LOS - 10  Chief Complaint  Patient presents with   Shortness of Breath       Brief Narrative  68 yr old female with PMHx HFpEF EF 60%, COPD, CVA, SVT, essential hypertension, obstructive sleep apnea, morbid obesity, who presented to the hospital with acute shortness of breath was diagnosed with COVID-19 infection, went into acute hypoxic respiratory failure intubated and kept in ICU by pulmonary critical care.  Transferred to [facility] covered ICU unit on 06/01/2018 under my care intubated.   Subjective:    Marvel Plan today is intubated and sedated   Assessment  & Plan :     1. Acute Hypoxic Resp. Failure due to Acute Covid 19 Viral Illness during the ongoing 2020 Covid 19 Pandemic - needed to be intubated, has finished treatment with hydroxychloroquine and azithromycin, continues to require endotracheal intubation with mechanical ventilation support, ARDS clinically seems to be improving, FiO2 requirements going down currently on 30%, CPAP trial started, tolerated it for a few hours on 06/02/2018, try to extubate in the next 1 to 2 days if remains hemodynamically stable and mentation is improved and able to follow commands, PCCM on board Case discussed with pulmonologist Dr. Lake Bells on 06/03/2018 & Dr Nelda Marseille on 06/04/18.  Vent Mode: PRVC FiO2 (%):  [40 %] 40 % Set Rate:  [16 bmp] 16 bmp Vt Set:  [510 mL] 510 mL PEEP:  [5 cmH20] 5 cmH20 Pressure Support:  [10 cmH20] 10 cmH20 Plateau Pressure:  [17 cmH20-23 cmH20] 22 cmH20  ABG      Component Value Date/Time   PHART 7.431 06/04/2018 0531   PCO2ART 50.9 (H) 06/04/2018 0531   PO2ART 107.0 06/04/2018 0531   HCO3 33.6 (H) 06/04/2018 0531   TCO2 35 (H) 06/04/2018 0531   O2SAT 98.0 06/04/2018 0531    2.  Septic shock.  Blood cultures negative to date presumed to be secondary to COVID-19 infection,  initially required pressors through left arm PICC line.  Now sepsis pathophysiology seems to have resolved.  3.  ARF.  Resolved.  4.  Iron deficiency anemia acute on chronic.  Some element of gradual blood loss due to repeated blood draws, have done type and screen, transfuse if hemoglobin drops below 7.  No signs of overt bleeding.  Continue PPI for prophylaxis.  5.  Hypokalemia.  Replaced and stable.  6.  Morbid obesity with underlying obstructive sleep apnea.  Outpatient follow-up with PCP.  7.  Nutrition. Tube feedings via G-tube.  Free water flushes ordered.  8.  Hypertension.  Now getting intermittently hypotensive for the last 5 to 7 days prior to coming to [facility], cortisol levels are borderline could have subclinical renal insufficiency, for now with lowest dose Precedex blood pressure is stable, if it drops again will have low threshold of starting her on stress dose IV steroids.  9.  History of CVA.  Currently on Plavix, started on low-dose statin.  10. Dyslipidemia.  Home dose statin replaced.    11.  Short burst of SVT during CPAP trial on 06/02/2018.  Resolved, lowest dose beta-blocker at night, if blood pressure tolerates will increase beta-blocker dose, PRN IV Lopressor also on board. Recent TTE shows an EF of 65% no WMA.  12. Underlying Dementia, 2 recent CVAs - continues to be encephalopathic despite sedation cessation, question if she had another stroke, will check head CT on 06/04/2018 if mentation does not improve.  Will also check B12, folate, RPR.  TSH and cortisol were relatively stable.  Also discussed with son in detail on  06/03/2018.  13.  Low-grade temperatures for 15 2020.  Chest x-ray nonacute, check UA and urine along with blood cultures, appears nontoxic continue to monitor closely.    14. DM2 - ISS  CBG (last 3)  Recent Labs    06/03/18 2359 06/04/18 0324 06/04/18 0814  GLUCAP 118* 130* 107*      Nutrition.  Tube feedings via G-tube.  Free water flushes ordered.   Condition - Extremely Guarded  Family Communication  : updated son Brain 06/04/18, no Trach, comfort care if she fails extubation.  Code Status :  Partial with No CPR  Diet : TF  Disposition Plan  :  ICU  Consults  :  PCCM  Procedures  :    Endotracheal intubation- 05/23/2018 L.Arm PICC - 05/30/18  Blood cx 05/21/2018- NGTD Novel Coronavirus Positive result called 4/4. Tracheal aspirate 4/7>>> moderate WBC (abundant GPC and GNR)  MRSA 4/5> neg    DVT Prophylaxis  :  Lovenox    Lab Results  Component Value Date   PLT 305 06/04/2018    Inpatient Medications  Scheduled Meds:  atorvastatin  20 mg Per Tube q1800   budesonide (PULMICORT) nebulizer solution  0.25 mg Nebulization BID   chlorhexidine gluconate (MEDLINE KIT)  15 mL Mouth Rinse BID   Chlorhexidine Gluconate Cloth  6 each Topical Daily   clopidogrel  75 mg Per Tube Daily   docusate  50 mg Oral BID   enoxaparin (LOVENOX) injection  60 mg Subcutaneous Q24H   feeding supplement (JEVITY 1.2 CAL)  1,000 mL Per Tube Q24H   feeding supplement (PRO-STAT SUGAR FREE 64)  60 mL Oral TID   free water  200 mL Per Tube Q8H   insulin aspart  0-15 Units Subcutaneous Q4H   mouth rinse  15 mL Mouth Rinse 10 times per day   metoprolol tartrate  12.5 mg Per Tube BID   pantoprazole sodium  40 mg Per Tube Daily   potassium chloride  40 mEq Per Tube Daily   sennosides  5 mL Oral QHS   silver sulfADIAZINE  1 application Topical Daily   sodium chloride flush  10-40 mL Intracatheter Q12H   sodium chloride flush  3 mL Intravenous Q12H   venlafaxine  37.5  mg Per Tube QHS   venlafaxine  75 mg Per Tube q morning - 10a   Continuous Infusions:  dexmedetomidine (PRECEDEX) IV infusion Stopped (06/02/18 0800)   PRN Meds:.acetaminophen, bisacodyl, fentaNYL (SUBLIMAZE) injection, ipratropium, levalbuterol, metoprolol tartrate, midazolam, [DISCONTINUED] ondansetron **OR** ondansetron (ZOFRAN) IV, Resource ThickenUp Clear  Antibiotics  :    Anti-infectives (From admission, onward)   Start     Dose/Rate Route Frequency Ordered Stop   05/27/18 2200  ceFEPIme (MAXIPIME) 2 g in sodium chloride 0.9 % 100 mL IVPB  Status:  Discontinued     2 g 200 mL/hr over 30 Minutes Intravenous Every 8 hours 05/27/18 1322 05/27/18 1400   05/27/18 1200  ceFEPIme (MAXIPIME) 2 g in sodium chloride 0.9 % 100 mL IVPB     2 g 200 mL/hr over 30 Minutes Intravenous  Once 05/27/18 1153 05/27/18 1330   05/26/18 2200  azithromycin (ZITHROMAX) tablet 500 mg  Status:  Discontinued     500 mg Oral Daily 05/26/18 0715 05/26/18 1502   05/26/18 2200  azithromycin (ZITHROMAX) tablet 500 mg     500 mg Per Tube Daily 05/26/18 1502 05/27/18 2141   05/24/18 2200  hydroxychloroquine (PLAQUENIL) tablet 200 mg     200 mg Oral 2 times daily 05/23/18 2113 05/28/18 1025   05/23/18 2300  azithromycin (ZITHROMAX) 500 mg in sodium chloride 0.9 % 250 mL IVPB  Status:  Discontinued     500 mg 250 mL/hr over 60 Minutes Intravenous Every 24 hours 05/23/18 2250 05/26/18 0715   05/23/18 2200  hydroxychloroquine (PLAQUENIL) tablet 400 mg     400 mg Oral 2 times daily 05/23/18 2113 05/24/18 1114   05/23/18 1500  vancomycin (VANCOCIN) 1,250 mg in sodium chloride 0.9 % 250 mL IVPB  Status:  Discontinued     1,250 mg 166.7 mL/hr over 90 Minutes Intravenous Every 24 hours 05/23/2018 1532 05/25/18 1141   05/23/18 0230  ceFEPIme (MAXIPIME) 2 g in sodium chloride 0.9 % 100 mL IVPB  Status:  Discontinued     2 g 200 mL/hr over 30 Minutes Intravenous Every 12 hours 05/21/2018 1532 05/25/18 1334   05/26/2018 1545   metroNIDAZOLE (FLAGYL) IVPB 500 mg     500 mg 100 mL/hr over 60 Minutes Intravenous  Once 05/29/2018 1531 06/13/2018 1731   06/13/2018 1530  vancomycin (VANCOCIN) IVPB 1000 mg/200 mL premix     1,000 mg 200 mL/hr over 60 Minutes Intravenous  Once 05/20/2018 1444 06/02/2018 1731   05/28/2018 1415  vancomycin (VANCOCIN) IVPB 1000 mg/200 mL premix     1,000 mg 200 mL/hr over 60 Minutes Intravenous  Once 05/29/2018 1401 06/08/2018 1538   06/01/2018 1415  ceFEPIme (MAXIPIME) 2 g in sodium chloride 0.9 % 100 mL IVPB     2 g 200 mL/hr over 30 Minutes Intravenous  Once 06/01/2018 1401 06/18/2018 1509       Time Spent in minutes  [place] M.D on 06/04/2018 at 10:14 AM  To page go to [email or web address] - password [facility]  [facility] -  Office  [phone]  Total Critical Care  time in examining the patient bedside, evaluating Lab work and other data, over half of the total time was spent in coordinating patient care on the floor or bedisde in talking to patient/family members, communicating with nursing Staff on the floor and sub specialists PCCM  to coordinate patients medical care and needs is  35 Minutes.   The condition which has caused critical injury/acute impairment of Pulmonary vital organ system with a high probability of sudden clinically significant deterioration and can cause Potential Life threatening injury to this patient addressed today is  Acute Hypoxic Resp failure.  See all Orders from today for further details   Admit date - 06/05/2018    13    Objective:   Vitals:   06/04/18 0900 06/04/18 0930 06/04/18 0936 06/04/18 1000  BP: 114/77 120/85 120/71 110/75  Pulse: 92 (!) 46 98 90  Resp: '17 18  15  ' Temp:      TempSrc:      SpO2: 100% 99%  98%  Weight:      Height:        Wt Readings from Last 3 Encounters:  06/04/18 126.3 kg  05/19/18 131.9 kg  05/04/18 127 kg     Intake/Output Summary (Last 24 hours) at 06/04/2018 1014 Last data filed at 06/04/2018 0953 Gross per 24  hour  Intake 1524.96 ml  Output 2320 ml  Net -795.04 ml     Physical Exam  Intubated, mildly sedated, opens eyes only to verbal commands, moves all 4 extremities to painful stimuli, ETT,OG, Foley, L arm PICC [place].AT,PERRAL Supple Neck,No JVD, No cervical lymphadenopathy appriciated.  Symmetrical Chest wall movement, Good air movement bilaterally, CTAB RRR,No Gallops, Rubs or new Murmurs, No Parasternal Heave +ve B.Sounds, Abd Soft, No tenderness, No organomegaly appriciated, No rebound - guarding or rigidity. No Cyanosis, Clubbing or edema, No new Rash or bruise   Data Review:    CBC Recent Labs  Lab 05/31/18 0521  06/01/18 0406  06/02/18 0500 06/03/18 0354 06/03/18 0435 06/04/18 0400 06/04/18 0531  WBC 7.9  --  8.3  --  7.3  --  9.9 8.8  --   HGB 7.4*   < > 7.5*   < > 7.4* 9.2* 8.1* 8.0* 8.5*  HCT 27.2*   < > 26.5*   < > 26.9* 27.0* 29.3* 28.6* 25.0*  PLT 277  --  265  --  264  --  322 305  --   MCV 91.3  --  89.8  --  92.1  --  92.1 92.3  --   MCH 24.8*  --  25.4*  --  25.3*  --  25.5* 25.8*  --   MCHC 27.2*  --  28.3*  --  27.5*  --  27.6* 28.0*  --   RDW 19.7*  --  20.4*  --  20.8*  --  21.6* 21.6*  --   LYMPHSABS  --   --   --   --  1.1  --  1.1 1.2  --   MONOABS  --   --   --   --  0.3  --  0.4 0.3  --   EOSABS  --   --   --   --  0.1  --  0.0 0.0  --   BASOSABS  --   --   --   --  0.0  --  0.1 0.1  --    < > = values in this interval not displayed.  Chemistries  Recent Labs  Lab 05/29/18 1053  05/31/18 0521  06/01/18 0406 06/01/18 1854  06/02/18 0500 06/03/18 0354 06/03/18 0435 06/04/18 0400 06/04/18 0531  NA 143   < > 145   < > 146* 145   < > 145 146* 146* 146* 147*  K 4.3   < > 3.4*   < > 3.0* 4.3   < > 4.3 4.1 4.0 4.0 4.2  CL 96*   < > 100  --  101 104  --  107  --  108 108  --   CO2 34*   < > 35*  --  32  --   --  31  --  30 31  --   GLUCOSE 191*   < > 111*  --  161* 142*  --  131*  --  119* 129*  --   BUN 36*   < > 32*  --  32* 38*  --  44*   --  49* 49*  --   CREATININE 1.08*   < > 0.87  --  1.04* 0.90  --  0.97  --  1.02* 1.04*  --   CALCIUM 8.6*   < > 8.2*  --  8.4*  --   --  8.0*  --  7.9* 8.1*  --   MG 2.4  --   --   --   --   --   --  2.5*  --   --   --   --   AST  --    < > 31  --  31  --   --  31  --  32 31  --   ALT  --    < > 21  --  21  --   --  20  --  19 20  --   ALKPHOS  --    < > 93  --  96  --   --  135*  --  150* 137*  --   BILITOT  --    < > 1.0  --  1.2  --   --  1.3*  --  1.2 1.2  --    < > = values in this interval not displayed.   ------------------------------------------------------------------------------------------------------------------ No results for input(s): CHOL, HDL, LDLCALC, TRIG, CHOLHDL, LDLDIRECT in the last 72 hours.  Lab Results  Component Value Date   HGBA1C 5.2 03/02/2018   ------------------------------------------------------------------------------------------------------------------ Recent Labs    06/02/18 0500  TSH 2.243   ------------------------------------------------------------------------------------------------------------------ Recent Labs    06/04/18 0400  VITAMINB12 969*    Coagulation profile No results for input(s): INR, PROTIME in the last 168 hours.  Recent Labs    06/02/18 0500 06/04/18 0400  DDIMER 2.13* 2.68*    Cardiac Enzymes Recent Labs  Lab 05/30/18 0327 05/31/18 0521 06/01/18 0406  TROPONINI 0.03* 0.03* <0.03   ------------------------------------------------------------------------------------------------------------------    Component Value Date/Time   BNP 2,117.1 (H) 05/28/2018 1402    Micro Results Recent Results (from the past 240 hour(s))  Culture, respiratory (non-expectorated)     Status: None   Collection Time: 05/26/18  9:35 AM  Result Value Ref Range Status   Specimen Description TRACHEAL ASPIRATE  Final   Special Requests NONE  Final   Gram Stain   Final    MODERATE WBC PRESENT,BOTH PMN AND MONONUCLEAR FEW  SQUAMOUS EPITHELIAL CELLS PRESENT ABUNDANT GRAM POSITIVE COCCI ABUNDANT GRAM NEGATIVE RODS FEW GRAM POSITIVE RODS    Culture  Final    MODERATE Consistent with normal respiratory flora. Performed at [facility], [place] [address] [place], [place] [address]    Report Status 05/28/2018 FINAL  Final    Radiology Reports  Ct Chest Wo Contrast  Result Date: 05/27/2018 CLINICAL DATA:  68 year old female with shortness of breath, fever. Patient also has a history of congestive heart failure. EXAM: CT CHEST WITHOUT CONTRAST TECHNIQUE: Multidetector CT imaging of the chest was performed following the standard protocol without IV contrast. COMPARISON:  Chest x-ray obtained earlier today; prior chest x-ray 05/19/18 FINDINGS: Cardiovascular: Limited evaluation in the absence of intravenous contrast. Cardiomegaly. Calcification of the mitral valve annulus. Coronary artery calcifications also present. Atherosclerotic calcifications throughout the aorta. No pericardial effusion. Mediastinum/Nodes: Thyroid gland is unremarkable. Borderline enlarged mediastinal lymph nodes. Index right paratracheal lymph node measures 1.2 cm in short axis. Index low right paratracheal lymph node measures 1.2 cm in short axis. Unremarkable thoracic esophagus. Lungs/Pleura: Extensive patchy ground-glass and more consolidative airspace opacities in a peripheral and peribronchovascular distribution throughout all lobes of both lungs but preferentially within the upper lobes. There is relative sparing of the middle lobe. Somewhat less significant disease within the dependent portions of the right lower lobe makes aspiration less likely. Additionally, there is mild bilateral lower lobe atelectasis secondary to the presence of small bilateral pleural effusions. Upper Abdomen: Small volume perihepatic ascites. Relative right renal atrophy. Musculoskeletal: No acute fracture or aggressive appearing lytic or blastic osseous lesion.  IMPRESSION: 1. There are a spectrum of findings in the lungs which can be seen with acute atypical infection (as well as other non-infectious etiologies). In particular, viral pneumonia (including COVID-19) should be considered in the appropriate clinical setting. Given the upper lung predominant pattern of distribution, aspiration is considered significantly less likely despite the patient's underlying risk factors. Similarly, congestive heart failure is considered less likely given the absence of interlobular septal thickening. 2. Small to moderate bilateral layering pleural effusions. 3. Small volume perihepatic site ease of uncertain etiology. 4. Borderline enlarged mediastinal lymph nodes are favored to be reactive. 5. Coronary artery calcifications. 6.  Aortic Atherosclerosis (ICD10-170.0). 7. Relative right renal atrophy. These results were called by telephone at the time of interpretation on 06/06/2018 at 4:42 pm to Dr. Evangeline Gula, who verbally acknowledged these results. Electronically Signed   By: Jacqulynn Cadet M.D.   On: 06/10/2018 16:43   Dg Chest Port 1 View  Result Date: 06/04/2018 CLINICAL DATA:  Shortness of breath. EXAM: PORTABLE CHEST 1 VIEW COMPARISON:  06/02/2018 FINDINGS: Endotracheal tube has tip 3.6 cm above the carina. Enteric tube courses into the region of the stomach and off the film as tip not visualized. Left-sided PICC line unchanged. Lungs are adequately inflated demonstrate hazy bilateral perihilar opacification with slight interval worsening over the right upper perihilar region. Findings may be due to interstitial edema versus infection. Stable bibasilar opacification likely small effusions with atelectasis. Stable cardiomegaly. Remainder the exam is unchanged. IMPRESSION: Hazy bilateral perihilar opacification with interval worsening over the right upper perihilar region. Findings may be due to interstitial edema versus infection. Stable bibasilar opacification likely small  effusions with atelectasis. Stable cardiomegaly. Tubes and lines as described. Electronically Signed   By: Marin Olp M.D.   On: 06/04/2018 09:04   Dg Chest Port 1 View  Result Date: 06/02/2018 CLINICAL DATA:  Short of breath EXAM: PORTABLE CHEST 1 VIEW COMPARISON:  05/30/2018 FINDINGS: Endotracheal and NG tubes are stable. Left PICC placed. Tip is at the  cavoatrial junction. Diffuse airspace disease and bilateral pleural effusions are stable. Cardiac silhouette is prominent likely due to AP technique. No pneumothorax. IMPRESSION: Stable diffuse bilateral airspace disease and bilateral pleural effusions. Left PICC placed.  Tip is at the cavoatrial junction. Electronically Signed   By: Marybelle Killings M.D.   On: 06/02/2018 07:40   Dg Chest Port 1 View  Result Date: 05/30/2018 CLINICAL DATA:  ARDS EXAM: PORTABLE CHEST 1 VIEW COMPARISON:  05/28/2018 FINDINGS: Endotracheal tube in good position.  NG tube in the stomach. Cardiac enlargement. Extensive diffuse bilateral airspace disease unchanged. Bilateral pleural effusions and bibasilar atelectasis unchanged. IMPRESSION: Diffuse bilateral airspace disease and bilateral effusions unchanged. Electronically Signed   By: Franchot Gallo M.D.   On: 05/30/2018 08:14   Dg Chest Port 1 View  Result Date: 05/28/2018 CLINICAL DATA:  67 year old female with ARDS EXAM: PORTABLE CHEST 1 VIEW COMPARISON:  Multiple prior most recent 05/27/2018, 05/23/2018, CT 06/18/2018 FINDINGS: Cardiomediastinal silhouette unchanged in size and contour, with cardiomegaly. Endotracheal tube unchanged terminating 2.7 cm above the carina. Unchanged gastric tube terminating out of the field of view. Low lung volumes with veil opacities at the bilateral lung bases obscuring the hemidiaphragm. Patchy airspace and interstitial opacities bilaterally, similar to the comparison. No pneumothorax. IMPRESSION: Unchanged endotracheal tube and gastric tube. Similar appearance of mixed interstitial and  airspace opacities of the bilateral lungs, with bibasilar pleural effusions. Electronically Signed   By: Corrie Mckusick D.O.   On: 05/28/2018 07:53   Dg Chest Port 1 View  Result Date: 05/27/2018 CLINICAL DATA:  68 year old female with respiratory failure EXAM: PORTABLE CHEST 1 VIEW COMPARISON:  05/23/2018, 05/23/2018, 05/19/2018 FINDINGS: Cardiomediastinal silhouette unchanged in size and contour. Endotracheal tube terminates 2.7 cm above the carina. Unchanged gastric tube, with the tip terminating just below the diaphragm. No pneumothorax. Worsening airspace opacity in the left lung and right mid lung with veil opacity at the lung bases obscuring the hemidiaphragms and heart borders. Interval removal of the defibrillator pads. IMPRESSION: Unchanged endotracheal tube and gastric tube. Persisting interstitial and airspace opacity of the lungs, worsening on the left. Small bilateral pleural effusions and associated atelectasis/consolidation. Electronically Signed   By: Corrie Mckusick D.O.   On: 05/27/2018 08:02   Dg Chest Port 1 View  Result Date: 05/23/2018 CLINICAL DATA:  Intubation EXAM: PORTABLE CHEST 1 VIEW COMPARISON:  06/16/2018 FINDINGS: Endotracheal tube tip is 3 cm above the carina. Orogastric or nasogastric tube enters the abdomen. The tip may be just beyond the gastroesophageal junction. Widespread bilateral pulmonary infiltrates persist. No worsening or new finding by radiography. IMPRESSION: Endotracheal tube 3 cm above the carina. Orogastric or nasogastric tube enters the abdomen, just beyond the gastroesophageal junction. Widespread bilateral infiltrates persist. Electronically Signed   By: Nelson Chimes M.D.   On: 05/23/2018 21:38   Dg Chest Port 1 View  Result Date: 06/17/2018 CLINICAL DATA:  Shortness of breath since yesterday. Tachycardia. Former smoker. EXAM: PORTABLE CHEST 1 VIEW COMPARISON:  05/19/2018 FINDINGS: Lungs are adequately inflated and demonstrate worsening of bilateral  multifocal airspace opacification which is most prominent over the right upper lobe and left perihilar region. Possible small amount left pleural fluid unchanged. Mild stable cardiomegaly. Remainder the exam is unchanged. IMPRESSION: Interval worsening bilateral multifocal airspace process most prominent over the right upper lobe and left perihilar region likely multifocal pneumonia. Small amount left pleural fluid unchanged. Mild stable cardiomegaly. Electronically Signed   By: Marin Olp M.D.   On: 05/23/2018 14:29  Dg Chest Port 1 View  Result Date: 05/19/2018 CLINICAL DATA:  Respiratory failure and congestive heart failure. EXAM: PORTABLE CHEST 1 VIEW COMPARISON:  05/18/2018 FINDINGS: Stable cardiac enlargement. Slight diminishment in CHF since the prior study. There is some asymmetric vascular congestion versus atelectasis in the right upper lung. Stable left basilar atelectasis and probable component of small to moderate left pleural effusion. IMPRESSION: Decreased prominence of CHF since the prior chest x-ray. Asymmetric vascular congestion versus atelectasis in the right upper lung. Left basilar atelectasis with probable component of left pleural fluid. Electronically Signed   By: Aletta Edouard M.D.   On: 05/19/2018 08:27   Dg Chest Port 1 View  Result Date: 05/18/2018 CLINICAL DATA:  Short of breath EXAM: PORTABLE CHEST 1 VIEW COMPARISON:  05/17/2018 FINDINGS: Stable enlarged cardiac silhouette. Bilateral pleural effusions similar prior. A central venous congestion. No focal consolidation. No pneumothorax. IMPRESSION: Cardiomegaly with small bilateral pleural effusions. Concern for mild congestive heart failure Electronically Signed   By: Suzy Bouchard M.D.   On: 05/18/2018 08:16   Dg Chest Port 1 View  Result Date: 05/17/2018 CLINICAL DATA:  Shortness of breath EXAM: PORTABLE CHEST 1 VIEW COMPARISON:  05/16/2018 FINDINGS: Cardiomegaly with mild perihilar edema and small bilateral  pleural effusions, grossly unchanged. No pneumothorax. IMPRESSION: Cardiomegaly mild interstitial edema and small bilateral pleural effusions, grossly unchanged. Electronically Signed   By: Julian Hy M.D.   On: 05/17/2018 08:11   Dg Chest Port 1 View  Result Date: 05/16/2018 CLINICAL DATA:  Shortness of breath EXAM: PORTABLE CHEST 1 VIEW COMPARISON:  05/15/2018 FINDINGS: Cardiomegaly with mild perihilar edema. Small to moderate bilateral pleural effusions, increased. No pneumothorax. IMPRESSION: Cardiomegaly with mild perihilar edema and small to moderate bilateral pleural effusions, increased. Electronically Signed   By: Julian Hy M.D.   On: 05/16/2018 07:41   Dg Chest Port 1 View  Result Date: 05/15/2018 CLINICAL DATA:  Shortness of breath EXAM: PORTABLE CHEST 1 VIEW COMPARISON:  May 14, 2018 FINDINGS: There is persistent cardiomegaly. Pulmonary vascularity is within normal limits. Currently there is no appreciable edema or consolidation. No adenopathy. No bone lesions. There is aortic atherosclerosis. IMPRESSION: Cardiomegaly. No edema or consolidation. Pulmonary vascularity within normal limits. Aortic Atherosclerosis (ICD10-I70.0). Electronically Signed   By: Lowella Grip III M.D.   On: 05/15/2018 07:35   Dg Chest Port 1 View  Result Date: 05/14/2018 CLINICAL DATA:  Aspiration EXAM: PORTABLE CHEST 1 VIEW COMPARISON:  May 14, 2018 study obtained earlier in the day FINDINGS: There is cardiomegaly with mild pulmonary venous hypertension. There are pleural effusions bilaterally. There is no appreciable edema or consolidation. There is aortic atherosclerosis. No adenopathy. No bone lesions. IMPRESSION: Pulmonary vascular congestion with small pleural effusions bilaterally. No frank edema or consolidation. Aortic Atherosclerosis (ICD10-I70.0). Electronically Signed   By: Lowella Grip III M.D.   On: 05/14/2018 16:00   Dg Chest Port 1 View  Result Date: 05/14/2018 CLINICAL  DATA:  Short of breath EXAM: PORTABLE CHEST 1 VIEW COMPARISON:  05/06/2018 FINDINGS: Cardiac enlargement with mild progression of vascular congestion. Progression of bibasilar atelectasis and small effusions. Atherosclerotic ascending aorta and aortic arch. IMPRESSION: Congestive heart failure with mild progression. Electronically Signed   By: Franchot Gallo M.D.   On: 05/14/2018 08:31   Dg Chest Port 1 View  Result Date: 05/06/2018 CLINICAL DATA:  Dyspnea. Bilateral leg swelling. EXAM: PORTABLE CHEST 1 VIEW COMPARISON:  01/21/2018. FINDINGS: Poor inspiration. No gross change in borderline enlargement of the cardiac silhouette  and mild prominence of the pulmonary vasculature and interstitial markings. No visible pleural fluid. Thoracic spine degenerative changes. IMPRESSION: Stable borderline cardiomegaly, mild pulmonary vascular congestion and mild chronic interstitial lung disease. Electronically Signed   By: Claudie Revering M.D.   On: 05/06/2018 18:07   [place] Ekg Site Rite  Result Date: 05/30/2018 If Site Rite image not attached, placement could not be confirmed due to current cardiac rhythm.  [place] Ekg Site Rite  Result Date: 05/30/2018 If Site Rite image not attached, placement could not be confirmed due to current cardiac rhythm.  US Abdomen Limited Ruq  Result Date: 05/14/2018 CLINICAL DATA:  Elevated LFTs. EXAM: ULTRASOUND ABDOMEN LIMITED RIGHT UPPER QUADRANT COMPARISON:  None. FINDINGS: Gallbladder: Physiologically distended. No gallstones. No significant gallbladder wall thickening. No sonographic Murphy sign noted by sonographer. Common bile duct: Diameter: 5 mm. Liver: No focal lesion identified. Diffusely heterogeneous and slightly increased in parenchymal echogenicity. Portal vein is patent on color Doppler imaging with normal direction of blood flow towards the liver. Small amount of right upper quadrant ascites. Right pleural effusion noted. IMPRESSION: 1. Mild hepatic steatosis. 2. No  gallstones.  No biliary dilatation. 3. Small volume of ascites.  Right pleural effusion. Electronically Signed   By: Keith Rake M.D.   On: 05/14/2018 22:18

## 2018-06-04 NOTE — Progress Notes (Signed)
Assisted tele visit to patient with son.  Atisha Hamidi P, RN   

## 2018-06-04 NOTE — Progress Notes (Signed)
NAME:  Shannon Carroll, MRN:  [id number], DOB:  08/18/1950, LOS: 75 ADMISSION DATE:  06/02/2018, CONSULTATION DATE:  05/23/2018 REFERRING MD:  Glenna Durand, CHIEF COMPLAINT:  SVT hpoxemia, COVID 31 POS   Brief History   68 yr old female with PMHx HFpEF, COPD, w/ bilateral infiltrates COVID positive on supplemental oxygen 8L with increased work of breathing, fatigue, and diaphoretic. PCCM consulted for acute respiratory distress.  Significant Hospital Events   4/3 + COVID -19  4/4 ICU admit intubated  4/12 weaning trial, failed, de-sat, SVT  4/15 episodes of SVT  Consults:  PCCM   Procedures:  Endotracheal intubation- 05/23/2018  Significant Diagnostic Tests:  Bilateral alveolar infiltrates on CXR -CXR 4/8 with worsening LLL and RML opacity   Micro Data:  Blood cx 05/28/2018- NGTD Novel Coronavirus Positive result called 4/4. Tracheal aspirate 4/7>>> moderate WBC (abundant GPC and GNR)  MRSA 4/5> neg   Antimicrobials:  Vancomycin- 05/23/2018 Cefepime- 05/23/2018-4/6, 4/8>>  Hydroxychloroquine- upon transfer to ICU -COVID (to end 4/9) Axithromycin - upon transfer to ICU- COVID (to end 4/8)  Interim history/subjective:  Tolerating some weaning this AM  Objective   Blood pressure (!) 110/91, pulse 77, temperature (!) 101.5 F (38.6 C), temperature source Axillary, resp. rate (!) 21, height 5\' 8"  (1.727 m), weight 126.3 kg, SpO2 99 %.    Vent Mode: PRVC FiO2 (%):  [40 %] 40 % Set Rate:  [16 bmp] 16 bmp Vt Set:  [510 mL] 510 mL PEEP:  [5 cmH20] 5 cmH20 Plateau Pressure:  [17 cmH20-23 cmH20] 21 cmH20   Intake/Output Summary (Last 24 hours) at 06/04/2018 1516 Last data filed at 06/04/2018 3825 Gross per 24 hour  Intake 1243.6 ml  Output 2075 ml  Net -831.4 ml   Filed Weights   06/02/18 0330 06/03/18 0426 06/04/18 0500  Weight: 125 kg 126.5 kg 126.3 kg   Examination:   General:  Acutely ill appearing elderly female, NAD HENT: [place]/AT, PERRL, EOM-I and MMM PULM: CTA bilaterally CV:  RRR, Nl S1/S2 and -M/R/G GI: Soft, NT, ND and +BS MSK: normal bulk and tone Neuro: awake, doesn't follow commands  I reviewed CXR myself, ETT is in a good position  Resolved Hospital Problem list   Transaminits.  Septic shock  Assessment & Plan:   Acute Hypoxic Respiratory Failure on mechanical ventilation  -ARDS secondary to  COVID-19 viral pneumonia Continue volume negative PS trials, no extubation for now Titrate FiO2 and PEEPfor sat of 88-92% Continue ventilator associated pneumonia prevention protocol If fails will need to consider trach  Baseline HFpEF > Diurese as able  COPD: > Continue Xopenex/Atrovent and Pulmicort nebulized  Acute toxic metabolic delirium > Minimize sedation Frequent orientation  Best practice:  Diet:tube feed Pain/Anxiety/Delirium protocol (if indicated): Continuous infusion VAP protocol (if indicated): NA DVT prophylaxis: SCD, Lovenox GI prophylaxis: Protonix Glucose control: sliding scale Mobility: bed bound Code Status: Partial code - REMAINS NO CPR, but if [place] OK IF WE PUT BACK ETT Family Communication: I spoke with patients son Disposition: ICU   Labs   CBC: Recent Labs  Lab 05/31/18 0521  06/01/18 0406  06/02/18 0500 06/03/18 0354 06/03/18 0435 06/04/18 0400 06/04/18 0531  WBC 7.9  --  8.3  --  7.3  --  9.9 8.8  --   NEUTROABS  --   --   --   --  5.8  --  8.3* 7.1  --   HGB 7.4*   < > 7.5*   < >  7.4* 9.2* 8.1* 8.0* 8.5*  HCT 27.2*   < > 26.5*   < > 26.9* 27.0* 29.3* 28.6* 25.0*  MCV 91.3  --  89.8  --  92.1  --  92.1 92.3  --   PLT 277  --  265  --  264  --  322 305  --    < > = values in this interval not displayed.    Basic Metabolic Panel: Recent Labs  Lab 05/29/18 1053  05/31/18 0521  06/01/18 0406 06/01/18 1854  06/02/18 0500 06/03/18 0354 06/03/18 0435 06/04/18 0400 06/04/18 0531  NA 143   < > 145   < > 146* 145   < > 145 146* 146* 146* 147*  K 4.3   < > 3.4*   < > 3.0* 4.3   < >  4.3 4.1 4.0 4.0 4.2  CL 96*   < > 100  --  101 104  --  107  --  108 108  --   CO2 34*   < > 35*  --  32  --   --  31  --  30 31  --   GLUCOSE 191*   < > 111*  --  161* 142*  --  131*  --  119* 129*  --   BUN 36*   < > 32*  --  32* 38*  --  44*  --  49* 49*  --   CREATININE 1.08*   < > 0.87  --  1.04* 0.90  --  0.97  --  1.02* 1.04*  --   CALCIUM 8.6*   < > 8.2*  --  8.4*  --   --  8.0*  --  7.9* 8.1*  --   MG 2.4  --   --   --   --   --   --  2.5*  --   --   --   --   PHOS 4.4  --   --   --   --   --   --   --   --   --   --   --    < > = values in this interval not displayed.   GFR: Estimated Creatinine Clearance: 73.7 mL/min (A) (by C-G formula based on SCr of 1.04 mg/dL (H)). Recent Labs  Lab 05/30/18 0327  05/31/18 0523 06/01/18 0406 06/02/18 0500 06/03/18 0435 06/04/18 0400  WBC 7.9   < >  --  8.3 7.3 9.9 8.8  LATICACIDVEN 1.1  --  0.8 1.0  --   --   --    < > = values in this interval not displayed.    Liver Function Tests: Recent Labs  Lab 05/31/18 0521 06/01/18 0406 06/02/18 0500 06/03/18 0435 06/04/18 0400  AST 31 31 31  32 31  ALT 21 21 20 19 20   ALKPHOS 93 96 135* 150* 137*  BILITOT 1.0 1.2 1.3* 1.2 1.2  PROT 5.8* 6.0* 6.4* 6.8 6.8  ALBUMIN 1.8* 2.0* 2.3* 2.4* 2.4*   No results for input(s): LIPASE, AMYLASE in the last 168 hours. No results for input(s): AMMONIA in the last 168 hours.  ABG    Component Value Date/Time   PHART 7.431 06/04/2018 0531   PCO2ART 50.9 (H) 06/04/2018 0531   PO2ART 107.0 06/04/2018 0531   HCO3 33.6 (H) 06/04/2018 0531   TCO2 35 (H) 06/04/2018 0531   O2SAT 98.0 06/04/2018 0531     Coagulation Profile:  Recent Labs  Lab 06/04/18 1043  INR 1.4*    Cardiac Enzymes: Recent Labs  Lab 05/30/18 0327 05/31/18 0521 06/01/18 0406  TROPONINI 0.03* 0.03* <0.03    HbA1C: Hgb A1c MFr Bld  Date/Time Value Ref Range Status  03/02/2018 04:43 PM 5.2 4.8 - 5.6 % Final    Comment:             Prediabetes: 5.7 - 6.4           Diabetes: >6.4          Glycemic control for adults with diabetes: <7.0     CBG: Recent Labs  Lab 06/03/18 1927 06/03/18 2359 06/04/18 0324 06/04/18 0814 06/04/18 1142  GLUCAP 129* 118* 130* 107* 123*   The patient is critically ill with multiple organ systems failure and requires high complexity decision making for assessment and support, frequent evaluation and titration of therapies, application of advanced monitoring technologies and extensive interpretation of multiple databases.   Critical Care Time devoted to patient care services described in this note is  32  Minutes. This time reflects time of care of this signee Dr Jennet Maduro. This critical care time does not reflect procedure time, or teaching time or supervisory time of PA/NP/Med student/Med Resident etc but could involve care discussion time.  Rush Farmer, M.D. [facility] Pulmonary/Critical Care Medicine. Pager: [phone]. After hours pager: [phone]

## 2018-06-04 NOTE — Progress Notes (Signed)
Occupational Therapy Evaluation  PTA, pt lived at an ALF and had assistance for mobility and ADL. Pt currently total A +2 for bed mobility and ADL. Pt intubated, FiO2 40%, Peep 5. Pt lethargic throughout session, opening eyes occasionally to name. BUE PROM. Positioned in chair position at end of session with VSS. Palm guard brought for L hand, however, pt does not need it at this time as she has full PROM. While in pod treating another pt, noted pt much more alert with eyes open, tracking therapists and nurses. Will continue to follow acutely as tolerated. Recommend pt be placed in chair position BID and complete PROM 3x/day. Recommend B Prevalon boots.     06/04/18 1600  OT Visit Information  Last OT Received On 06/04/18  Assistance Needed +2  PT/OT/SLP Co-Evaluation/Treatment Yes  Reason for Co-Treatment Complexity of the patient's impairments (multi-system involvement)  OT goals addressed during session ADL's and self-care;Strengthening/ROM  History of Present Illness 68 y.o. female recently d/c from the hospital on 05/19/18 for CHF exacerbation to [facility] for rehab.  She was re-admitted on 06/16/2018 for SOB and hypoxia.  Pt dx with COVID-19, septic shock, ARF, iron deficient anemia, and short bursts of SVT.  Intubated 05/23/18-still currently intubated.  Pt with significant PMH of stroke (presumed L deficits), obesity, HTN, DM, and CKD (stage 2).  Precautions  Precautions Fall  Precaution Comments ET tube; NG  Restrictions  Weight Bearing Restrictions No  Home Living  Family/patient expects to be discharged to: Assisted living  Living Arrangements Spouse/significant other  Available Help at Discharge Personal care attendant  Type of Home Assisted living  Home Access Level entry  Home Layout One level  Tax adviser - 4 wheels;Shower seat;Grab bars - toilet;Grab bars - tub/shower;BSC  Prior Function  Level of Independence Needs assistance  Gait /  Transfers Assistance Needed RW or wheel chair for mobility, RW short distances with help  ADL's / [place] assist for bathing and dressing and all IADL  Communication  Communication Other (comment) (ET tube)  Pain Assessment  Pain Assessment Faces  Faces Pain Scale 2  Pain Location with rolling  Pain Descriptors / Indicators Grimacing  Pain Intervention(s) Limited activity within patient's tolerance  Cognition  Arousal/Alertness Lethargic  Behavior During Therapy Flat affect  Overall Cognitive Status Difficult to assess  Current Attention Level Focused  Following Commands Follows one step commands inconsistently  General Comments Opens eyes to name inconsistently; eyes closed majority of session  Difficult to assess due to Intubated  Upper Extremity Assessment  Upper Extremity Assessment Generalized weakness  RUE Deficits / Details Appeasr to be assisting with movement RUE at times; not consistently  LUE Deficits / Details Hand initially fisted; able to complete full PROM  Lower Extremity Assessment  Lower Extremity Assessment Defer to PT evaluation  Cervical / Trunk Assessment  Cervical / Trunk Assessment Other exceptions  Cervical / Trunk Exceptions Head turned toward R; able to rotate to left; forward head; not spontaneously extending head during bed mobility  ADL  General ADL Comments total A  Vision- Assessment  Additional Comments eyes closed majority of session; when opened; appears to have a conjugate gaze  Praxis  Praxis-Other Comments poor initiation  Bed Mobility  Overal bed mobility Needs Assistance  Bed Mobility Rolling  Rolling Total assist;+2 for physical assistance  Transfers  General transfer comment not attempted  Exercises  Exercises General Upper Extremity;General Lower Extremity  General Exercises - Upper  Extremity  Shoulder Flexion PROM;Both;10 reps;Supine  Elbow Flexion PROM;10 reps;Supine;Both  Shoulder ABduction Both;PROM;10  reps;Supine  Elbow Extension PROM;Both;10 reps;Supine  Wrist Flexion PROM;Both;10 reps  Wrist Extension PROM;Both;10 reps;Supine  Digit Composite Flexion PROM;Both;10 reps;Supine  Composite Extension PROM;Both;10 reps;Supine  OT - End of Session  Equipment Utilized During Treatment Other (comment) (vent)  Activity Tolerance Patient limited by lethargy  Patient left in bed;with call bell/phone within reach;with nursing/sitter in room  Nurse Communication Mobility status;Other (comment) (Palm guard L hand if needed)  OT Assessment  OT Recommendation/Assessment Patient needs continued OT Services  OT Visit Diagnosis Other abnormalities of gait and mobility (R26.89);Muscle weakness (generalized) (M62.81);Other symptoms and signs involving cognitive function  OT Problem List Decreased strength;Decreased activity tolerance;Decreased range of motion;Impaired balance (sitting and/or standing);Decreased coordination;Impaired vision/perception;Decreased cognition;Decreased safety awareness;Decreased knowledge of use of DME or AE;Decreased knowledge of precautions;Cardiopulmonary status limiting activity;Obesity;Impaired UE functional use  OT Plan  OT Frequency (ACUTE ONLY) Min 2X/week  OT Treatment/Interventions (ACUTE ONLY) Self-care/ADL training;Therapeutic exercise;DME and/or AE instruction;Energy conservation;Therapeutic activities;Cognitive remediation/compensation;Patient/family education;Balance training  AM-PAC OT "6 Clicks" Daily Activity Outcome Measure (Version 2)  Help from another person eating meals? 1  Help from another person taking care of personal grooming? 1  Help from another person toileting, which includes using toliet, bedpan, or urinal? 1  Help from another person bathing (including washing, rinsing, drying)? 1  Help from another person to put on and taking off regular upper body clothing? 1  Help from another person to put on and taking off regular lower body clothing? 1  6  Click Score 6  OT Recommendation  Follow Up Recommendations SNF  OT Equipment None recommended by OT  Individuals Consulted  Consulted and Agree with Results and Recommendations Patient unable/family or caregiver not available  Acute Rehab OT Goals  Patient Stated Goal unable to state  OT Goal Formulation Patient unable to participate in goal setting  Time For Goal Achievement 06/18/18  Potential to Achieve Goals Fair  OT Time Calculation  OT Start Time (ACUTE ONLY) 1450  OT Stop Time (ACUTE ONLY) 1530  OT Time Calculation (min) 40 min  OT General Charges  $OT Visit 1 Visit  OT Evaluation  $OT Eval Moderate Complexity 1 Mod  Written Expression  Dominant Hand Right  Maurie Boettcher, OT/L   Acute OT Clinical Specialist Acute Rehabilitation Services Pager [phone] Office [phone]

## 2018-06-04 NOTE — Progress Notes (Signed)
Physical Therapy Treatment Patient Details Name: Shannon Carroll MRN: [id number] DOB: 22-Sep-1950 Today's Date: 06/04/2018    History of Present Illness 68 y.o. female recently d/c from the hospital on 05/19/18 for CHF exacerbation to [facility] for rehab.  She was re-admitted on 05/30/2018 for SOB and hypoxia.  Pt dx with COVID-19, septic shock, ARF, iron deficient anemia, and short bursts of SVT.  Intubated 05/23/18-still currently intubated.  Pt with significant PMH of stroke (presumed L deficits), obesity, HTN, DM, and CKD (stage 2).    PT Comments    Pt was more lethargic initially today, but did perk up (unfortunately) a few mins after we were done putting her in chair mode).  She remains on similar vent settings as last session, but was so lethergic initially, we did not attempt EOB.  If she is as alert as she was after our session tomorrow, we may have to come back around and get her EOB after positioning her in chair mode.  Pt's VSS throughout with only alarm throughout being (PAW high).  PT will continue to follow acutely and will see tomorrow with plans again to attempt EOB.    Follow Up Recommendations  SNF     Equipment Recommendations  Wheelchair (measurements PT);Wheelchair cushion (measurements PT);Hospital bed;Other (comment)(hoyer lift)    Recommendations for Other Services Speech consult(once extubated for swallow)     Precautions / Restrictions Precautions Precautions: Fall Precaution Comments: ET tube; NG Restrictions Weight Bearing Restrictions: No    Mobility  Bed Mobility Overal bed mobility: Needs Assistance Bed Mobility: Rolling Rolling: Total assist;+2 for physical assistance         General bed mobility comments: Pt was not initiating movement to help with rolling bil, did not follow any commands for [place] during the session, but was much more alert   Transfers                 General transfer comment: not attempted due to lethargy during our session.              Cognition Arousal/Alertness: Lethargic(was awake more after our session in upright positioning) Behavior During Therapy: Flat affect Overall Cognitive Status: Difficult to assess Area of Impairment: Awareness;Memory;Safety/judgement;Orientation;Problem solving                 Orientation Level: Disoriented to;Time;Situation Current Attention Level: Focused Memory: Decreased short-term memory Following Commands: Follows one step commands inconsistently Safety/Judgement: Decreased awareness of safety;Decreased awareness of deficits Awareness: Intellectual Problem Solving: Slow processing General Comments: Opens eyes to name inconsistently; eyes closed majority of session      Exercises General Exercises - Upper Extremity Shoulder Flexion: PROM;Both;10 reps;Supine Shoulder ABduction: Both;PROM;10 reps;Supine Elbow Flexion: PROM;10 reps;Supine;Both Elbow Extension: PROM;Both;10 reps;Supine Wrist Flexion: PROM;Both;10 reps Wrist Extension: PROM;Both;10 reps;Supine Digit Composite Flexion: PROM;Both;10 reps;Supine Composite Extension: PROM;Both;10 reps;Supine General Exercises - Lower Extremity Ankle Circles/Pumps: PROM;Both;5 reps Heel Slides: PROM;Both;10 reps Hip ABduction/ADduction: PROM;Both;10 reps    General Comments General comments (skin integrity, edema, etc.): Pt on PRVC FiO2 40%, PEEP 5, RR set at 16, but stayed in mid to upper 20s throughout.  BP softer, but stable, and HR low 100s during mobility. Pt positioned in chair mode at end of session with HOB 62 degrees and legs all the way down.       Pertinent Vitals/Pain Pain Assessment: Faces Faces Pain Scale: Hurts a little bit Pain Location: with rolling Pain Descriptors / Indicators: Grimacing Pain Intervention(s): Limited activity within patient's tolerance;Monitored during session;Repositioned  Home Living Family/patient expects to be discharged to:: Assisted living Living Arrangements:  Spouse/significant other Available Help at Discharge: Personal care attendant Type of Home: Assisted living Home Access: Level entry   Home Layout: One level Home Equipment: Walker - 4 wheels;Shower seat;Grab bars - toilet;Grab bars - tub/shower;Bedside commode      Prior Function Level of Independence: Needs assistance  Gait / Transfers Assistance Needed: RW or wheel chair for mobility, RW short distances with help ADL's / Homemaking Assistance Needed: assist for bathing and dressing and all IADL     PT Goals (current goals can now be found in the care plan section) Acute Rehab PT Goals Patient Stated Goal: unable to state Progress towards PT goals: Progressing toward goals    Frequency    Min 3X/week      PT Plan Current plan remains appropriate    Co-evaluation PT/OT/SLP Co-Evaluation/Treatment: Yes Reason for Co-Treatment: Complexity of the patient's impairments (multi-system involvement);Necessary to address cognition/behavior during functional activity;For patient/therapist safety PT goals addressed during session: Mobility/safety with mobility;Strengthening/ROM OT goals addressed during session: ADL's and self-care;Strengthening/ROM      AM-PAC PT "6 Clicks" Mobility   Outcome Measure  Help needed turning from your back to your side while in a flat bed without using bedrails?: Total Help needed moving from lying on your back to sitting on the side of a flat bed without using bedrails?: Total Help needed moving to and from a bed to a chair (including a wheelchair)?: Total Help needed standing up from a chair using your arms (e.g., wheelchair or bedside chair)?: Total Help needed to walk in hospital room?: Total Help needed climbing 3-5 steps with a railing? : Total 6 Click Score: 6    End of Session Equipment Utilized During Treatment: Oxygen;Other (comment)(ETT) Activity Tolerance: Patient tolerated treatment well Patient left: in bed;Other (comment)(in bed in  max elevated chair mode.)   PT Visit Diagnosis: Muscle weakness (generalized) (M62.81);Difficulty in walking, not elsewhere classified (R26.2)     Time: 1450-1530 PT Time Calculation (min) (ACUTE ONLY): 40 min  Charges:  $Therapeutic Exercise: 8-22 mins $Therapeutic Activity: 8-22 mins                    Dorethea Strubel B. Peyten Punches, PT, DPT  Acute Rehabilitation [phone] pager #[phone] office   06/04/2018, 6:01 PM

## 2018-06-04 NOTE — Progress Notes (Signed)
Nutrition Follow-up RD working remotely.  DOCUMENTATION CODES:   Morbid obesity  INTERVENTION:    Continue Jevity 1.2 at 40 ml/h with Pro-stat 60 ml TID to provide 1752 kcal, 143 gm protein, 778 ml free water daily  NUTRITION DIAGNOSIS:   Inadequate oral intake related to inability to eat as evidenced by NPO status.  Ongoing  GOAL:   Provide needs based on ASPEN/SCCM guidelines  Met with TF  MONITOR:   Vent status, TF tolerance, Labs, Skin, I & O's  ASSESSMENT:   68 yo female with PMH of obesity, DM, HTN, HLD, CKD, stage 2, stroke who was admitted with SVT, hypoxemia, COVID-19 positive. Required intubation 4/4.  Patient remains intubated on ventilator support MV: 11.4 L/min Temp (24hrs), Avg:100.4 F (38 C), Min:98.5 F (36.9 C), Max:102.4 F (39.1 C) MAP >/= 75 for the past 24+ hours   Labs reviewed. Sodium 147 (H) CBS's: 130-107-123 Medications reviewed and include Novolog, KCl, Precedex.  Weight gradually trending upward. I/O +3.5 L since admission Mild pitting edema present per RN flow sheets.  Currently receiving Jevity 1.2 via OGT at 40 ml/h (960 ml/day) with Prostat 60 ml TID to provide 1752 kcals, 143 gm protein, 778 ml free water daily.    Diet Order:   Diet Order    None      EDUCATION NEEDS:   No education needs have been identified at this time  Skin:  Skin Assessment: Skin Integrity Issues: Skin Integrity Issues:: Other (Comment) Other: open blister L pretibial; MASD to breast, abdomen, groin  Last BM:  4/16 (type 6)  Height:   Ht Readings from Last 1 Encounters:  05/23/18 '5\' 8"'$  (1.727 m)    Weight:   Wt Readings from Last 1 Encounters:  06/04/18 126.3 kg    Ideal Body Weight:  63.6 kg  BMI:  Body mass index is 42.34 kg/m.  Estimated Nutritional Needs:   Kcal:  0277-4128  Protein:  130-160 gm  Fluid:  1.9 L    Molli Barrows, RD, LDN, [place] Pager [phone] After Hours Pager [phone]

## 2018-06-05 ENCOUNTER — Inpatient Hospital Stay (HOSPITAL_COMMUNITY): Payer: Medicare Other

## 2018-06-05 DIAGNOSIS — R509 Fever, unspecified: Secondary | ICD-10-CM

## 2018-06-05 DIAGNOSIS — M7989 Other specified soft tissue disorders: Secondary | ICD-10-CM

## 2018-06-05 DIAGNOSIS — R7989 Other specified abnormal findings of blood chemistry: Secondary | ICD-10-CM

## 2018-06-05 LAB — BLOOD CULTURE ID PANEL (REFLEXED)

## 2018-06-05 LAB — COMPREHENSIVE METABOLIC PANEL
ALT: 14 U/L (ref 0–44)
AST: 23 U/L (ref 15–41)
Albumin: 1.7 g/dL — ABNORMAL LOW (ref 3.5–5.0)
Alkaline Phosphatase: 82 U/L (ref 38–126)
Anion gap: 3 — ABNORMAL LOW (ref 5–15)
BUN: 37 mg/dL — ABNORMAL HIGH (ref 8–23)
CO2: 21 mmol/L — ABNORMAL LOW (ref 22–32)
Calcium: 5.4 mg/dL — CL (ref 8.9–10.3)
Chloride: 124 mmol/L — ABNORMAL HIGH (ref 98–111)
Creatinine, Ser: 0.74 mg/dL (ref 0.44–1.00)
GFR calc Af Amer: >60 mL/min (ref >60)
GFR calc non Af Amer: >60 mL/min (ref >60)
Glucose, Bld: 89 mg/dL (ref 70–99)
Potassium: 2.6 mmol/L — CL (ref 3.5–5.1)
Sodium: 148 mmol/L — ABNORMAL HIGH (ref 135–145)
Total Bilirubin: 0.9 mg/dL (ref 0.3–1.2)
Total Protein: 4.7 g/dL — ABNORMAL LOW (ref 6.5–8.1)

## 2018-06-05 LAB — POCT I-STAT, CHEM 8
BUN: 32 mg/dL — ABNORMAL HIGH (ref 8–23)
Calcium, Ion: 1.05 mmol/L — ABNORMAL LOW (ref 1.15–1.40)
Chloride: 94 mmol/L — ABNORMAL LOW (ref 98–111)
Creatinine, Ser: 0.9 mg/dL (ref 0.44–1.00)
Glucose, Bld: 656 mg/dL (ref 70–99)
HCT: 27 % — ABNORMAL LOW (ref 36.0–46.0)
Hemoglobin: 9.2 g/dL — ABNORMAL LOW (ref 12.0–15.0)
Potassium: 4.1 mmol/L (ref 3.5–5.1)
Sodium: 131 mmol/L — ABNORMAL LOW (ref 135–145)
TCO2: 27 mmol/L (ref 22–32)

## 2018-06-05 LAB — FOLATE RBC
Folate, Hemolysate: 541 ng/mL
Folate, RBC: 2049 ng/mL (ref >498)
Hematocrit: 26.4 % — ABNORMAL LOW (ref 34.0–46.6)

## 2018-06-05 LAB — CBC WITH DIFFERENTIAL/PLATELET
Abs Immature Granulocytes: 0.05 10*3/uL (ref 0.00–0.07)
Basophils Absolute: 0.1 10*3/uL (ref 0.0–0.1)
Basophils Relative: 1 %
Eosinophils Absolute: 0.3 10*3/uL (ref 0.0–0.5)
Eosinophils Relative: 3 %
HCT: 30 % — ABNORMAL LOW (ref 36.0–46.0)
Hemoglobin: 8.3 g/dL — ABNORMAL LOW (ref 12.0–15.0)
Immature Granulocytes: 1 %
Lymphocytes Relative: 14 %
Lymphs Abs: 1.1 10*3/uL (ref 0.7–4.0)
MCH: 25.6 pg — ABNORMAL LOW (ref 26.0–34.0)
MCHC: 27.7 g/dL — ABNORMAL LOW (ref 30.0–36.0)
MCV: 92.6 fL (ref 80.0–100.0)
Monocytes Absolute: 0.4 10*3/uL (ref 0.1–1.0)
Monocytes Relative: 5 %
Neutro Abs: 6.2 10*3/uL (ref 1.7–7.7)
Neutrophils Relative %: 76 %
Platelets: 314 10*3/uL (ref 150–400)
RBC: 3.24 MIL/uL — ABNORMAL LOW (ref 3.87–5.11)
RDW: 21.6 % — ABNORMAL HIGH (ref 11.5–15.5)
WBC: 8 10*3/uL (ref 4.0–10.5)
nRBC: 0 % (ref 0.0–0.2)

## 2018-06-05 LAB — POCT I-STAT 7, (LYTES, BLD GAS, ICA,H+H)
Acid-Base Excess: 9 mmol/L — ABNORMAL HIGH (ref 0.0–2.0)
Bicarbonate: 32.8 mmol/L — ABNORMAL HIGH (ref 20.0–28.0)
Calcium, Ion: 1.12 mmol/L — ABNORMAL LOW (ref 1.15–1.40)
HCT: 26 % — ABNORMAL LOW (ref 36.0–46.0)
Hemoglobin: 8.8 g/dL — ABNORMAL LOW (ref 12.0–15.0)
O2 Saturation: 99 %
Patient temperature: 98.6
Potassium: 4 mmol/L (ref 3.5–5.1)
Sodium: 149 mmol/L — ABNORMAL HIGH (ref 135–145)
TCO2: 34 mmol/L — ABNORMAL HIGH (ref 22–32)
pCO2 arterial: 41.5 mmHg (ref 32.0–48.0)
pH, Arterial: 7.506 — ABNORMAL HIGH (ref 7.350–7.450)
pO2, Arterial: 110 mmHg — ABNORMAL HIGH (ref 83.0–108.0)

## 2018-06-05 LAB — MAGNESIUM: Magnesium: 1.6 mg/dL — ABNORMAL LOW (ref 1.7–2.4)

## 2018-06-05 LAB — GLUCOSE, CAPILLARY
Glucose-Capillary: 108 mg/dL — ABNORMAL HIGH (ref 70–99)
Glucose-Capillary: 110 mg/dL — ABNORMAL HIGH (ref 70–99)
Glucose-Capillary: 119 mg/dL — ABNORMAL HIGH (ref 70–99)
Glucose-Capillary: 137 mg/dL — ABNORMAL HIGH (ref 70–99)
Glucose-Capillary: 154 mg/dL — ABNORMAL HIGH (ref 70–99)
Glucose-Capillary: 153 mg/dL — ABNORMAL HIGH (ref 70–99)
Glucose-Capillary: 134 mg/dL — ABNORMAL HIGH (ref 70–99)
Glucose-Capillary: 111 mg/dL — ABNORMAL HIGH (ref 70–99)

## 2018-06-05 MED ORDER — POTASSIUM CHLORIDE 10 MEQ/100ML IV SOLN: 10.0000 meq | INTRAVENOUS | Status: DC

## 2018-06-05 MED ORDER — CALCIUM GLUCONATE-NACL 1-0.675 GM/50ML-% IV SOLN
1.0000 g | Freq: Once | INTRAVENOUS | Status: AC
  Administered 2018-06-05: 1000 mg via INTRAVENOUS
  Filled 2018-06-05: qty 50

## 2018-06-05 MED ORDER — MAGNESIUM SULFATE 50 % IJ SOLN
1.0000 g | Freq: Once | INTRAVENOUS | Status: AC
  Administered 2018-06-05: 10:00:00 1 g via INTRAVENOUS
  Filled 2018-06-05: qty 2

## 2018-06-05 MED ORDER — MAGNESIUM SULFATE 50 % IJ SOLN
1.0000 g | Freq: Once | INTRAVENOUS | Status: AC
  Administered 2018-06-05: 08:00:00 1 g via INTRAVENOUS
  Filled 2018-06-05: qty 2

## 2018-06-05 MED ORDER — POTASSIUM CHLORIDE 20 MEQ/15ML (10%) PO SOLN: 40.0000 meq | Freq: Once | ORAL | Status: DC

## 2018-06-05 MED ORDER — POTASSIUM CHLORIDE 20 MEQ/15ML (10%) PO SOLN
40.0000 meq | Freq: Once | ORAL | Status: AC
  Administered 2018-06-05: 40 meq via ORAL
  Filled 2018-06-05: qty 30

## 2018-06-05 MED ORDER — DEXTROSE 5 % IV SOLN
INTRAVENOUS | Status: DC
  Administered 2018-06-05: 19:00:00 via INTRAVENOUS

## 2018-06-05 MED ORDER — DEXTROSE 5 % IV SOLN
INTRAVENOUS | Status: AC
  Administered 2018-06-05: 09:00:00 via INTRAVENOUS

## 2018-06-05 MED ORDER — POTASSIUM CHLORIDE CRYS ER 20 MEQ PO TBCR: 40.0000 meq | EXTENDED_RELEASE_TABLET | Freq: Four times a day (QID) | ORAL | Status: DC

## 2018-06-05 MED ORDER — MAGNESIUM SULFATE IN D5W 1-5 GM/100ML-% IV SOLN
1.0000 g | Freq: Once | INTRAVENOUS | Status: DC
  Filled 2018-06-05: qty 100

## 2018-06-05 MED ORDER — PRO-STAT SUGAR FREE PO LIQD
30.0000 mL | Freq: Three times a day (TID) | ORAL | Status: DC
  Administered 2018-06-05 – 2018-06-10 (×17): 30 mL
  Filled 2018-06-05 (×14): qty 30

## 2018-06-05 MED ORDER — FREE WATER
200.0000 mL | Freq: Four times a day (QID) | Status: DC
  Administered 2018-06-05 – 2018-06-15 (×41): 200 mL

## 2018-06-05 MED ORDER — POTASSIUM CHLORIDE 10 MEQ/100ML IV SOLN
10.0000 meq | INTRAVENOUS | Status: AC
  Administered 2018-06-05 (×3): 10 meq via INTRAVENOUS
  Filled 2018-06-05 (×4): qty 100

## 2018-06-05 MED ORDER — MAGNESIUM SULFATE IN D5W 2-5 GM/100ML-% IV SOLN: 1.0000 g | Freq: Once | INTRAVENOUS | Status: DC

## 2018-06-05 NOTE — Progress Notes (Addendum)
Spoke with Patient son Mitzi Hansen and addressed concerns he ad with the information received. After updating him, he seemed more at ease and had a better understanding of the information and with his mothers condition.

## 2018-06-05 NOTE — Progress Notes (Signed)
Physical Therapy Treatment Patient Details Name: Shannon Carroll MRN: [id number] DOB: 06/24/1950 Today's Date: 06/05/2018    History of Present Illness 68 y.o. female recently d/c from the hospital on 05/19/18 for CHF exacerbation to [facility] for rehab.  She was re-admitted on 06/12/2018 for SOB and hypoxia.  Pt dx with COVID-19, septic shock, ARF, iron deficient anemia, and short bursts of SVT.  Intubated 05/23/18-still currently intubated.  Pt with significant PMH of stroke (presumed L deficits), obesity, HTN, DM, and CKD (stage 2).    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 She remains on; pt on similar vent settings as last session, PRVC mode, FiO2 30%%, PEEP 5, RR 16, O2 sats 100%, HR 100s; left in chair mode, see below for details;  Per  RN pt family is interested in [place] and I agree this would be benefcial given pt functional status prior to this admission; will continue to follow   Follow Up Recommendations  SNF     Equipment Recommendations  Wheelchair (measurements PT);Wheelchair cushion (measurements PT);Hospital bed;Other (comment)(hoyer lift (no needs if SNF))    Recommendations for Other Services       Precautions / Restrictions Precautions Precautions: Fall Precaution Comments: ET tube; NG Restrictions Weight Bearing Restrictions: No    Mobility  Bed Mobility               General bed mobility comments: (not assessed this date)  Transfers                    Ambulation/Gait                 Stairs             Wheelchair Mobility    Modified Rankin (Stroke Patients Only)       Balance                                            Cognition Arousal/Alertness: Lethargic   Overall Cognitive Status: Difficult to assess                                 General Comments: Opens eyes to name inconsistently; eyes  closed majority of session; pt was alert prior to session and most of day per RN; has not had any sedating meds today per RN      Exercises General Exercises - Lower Extremity Ankle Circles/Pumps: PROM;Both;10 reps Heel Slides: PROM;Both;10 reps Hip ABduction/ADduction: PROM;Both;10 reps Other Exercises Other Exercises: internal rotation bil hips to neutral x 5 reps  Other Exercises: cervical ROM; gentle  lateral flexion/rotation (pt with R lateral flexion preference)    General Comments        Pertinent Vitals/Pain Pain Assessment: Faces Faces Pain Scale: Hurts a little bit Pain Location: pulls L hand away intermittently with ROM Pain Descriptors / Indicators: Grimacing;Other (Comment) Pain Intervention(s): Monitored during session    Home Living                      Prior Function            PT Goals (current goals can now be found in the care plan section) Acute Rehab  PT Goals Patient Stated Goal: unable to state PT Goal Formulation: Patient unable to participate in goal setting Time For Goal Achievement: 06/16/18 Potential to Achieve Goals: Fair Progress towards PT goals: Not progressing toward goals - comment(d/t lethargy)    Frequency    Min 3X/week      PT Plan Current plan remains appropriate    Co-evaluation PT/OT/SLP Co-Evaluation/Treatment: Yes Reason for Co-Treatment: Complexity of the patient's impairments (multi-system involvement);For patient/therapist safety PT goals addressed during session: Strengthening/ROM        AM-PAC PT "6 Clicks" Mobility   Outcome Measure  Help needed turning from your back to your side while in a flat bed without using bedrails?: Total Help needed moving from lying on your back to sitting on the side of a flat bed without using bedrails?: Total Help needed moving to and from a bed to a chair (including a wheelchair)?: Total Help needed standing up from a chair using your arms (e.g., wheelchair or bedside  chair)?: Total Help needed to walk in hospital room?: Total Help needed climbing 3-5 steps with a railing? : Total 6 Click Score: 6    End of Session Equipment Utilized During Treatment: Oxygen;Other (comment)(ETT) Activity Tolerance: Patient limited by fatigue Patient left: in bed(chair mode, HOB ~65*) Nurse Communication: Other (comment) PT Visit Diagnosis: Muscle weakness (generalized) (M62.81);Difficulty in walking, not elsewhere classified (R26.2)     Time: 1532-1600 PT Time Calculation (min) (ACUTE ONLY): 28 min  Charges:  $Therapeutic Exercise: 8-22 mins                        [facility] 06/05/2018, 4:25 PM

## 2018-06-05 NOTE — Progress Notes (Signed)
PROGRESS NOTE                                                                                                                                                                                                             Patient Demographics:    Shannon Carroll, is a 68 y.o. female, DOB - 10/26/1950, FYB:[id number]  Outpatient Primary MD for the patient is Harlan Stains, MD    LOS - 48  Chief Complaint  Patient presents with   Shortness of Breath       Brief Narrative  68 yr old female with PMHx HFpEF EF 60%, COPD, CVA, SVT, essential hypertension, obstructive sleep apnea, morbid obesity, who presented to the hospital with acute shortness of breath was diagnosed with COVID-19 infection, went into acute hypoxic respiratory failure intubated and kept in ICU by pulmonary critical care.  Transferred to [facility] covered ICU unit on 06/01/2018 under my care intubated.   Subjective:    Marvel Plan today is intubated and sedated   Assessment  & Plan :     1. Acute Hypoxic Resp. Failure due to Acute Covid 19 Viral Illness during the ongoing 2020 Covid 19 Pandemic - needed to be intubated, has finished treatment with hydroxychloroquine and azithromycin, continues to require endotracheal intubation with mechanical ventilation support, ARDS clinically seems to be improving, FiO2 requirements going down currently on 30%, CPAP trial started, tolerated it for a few hours on 06/02/2018, try to extubate in the next 1 to 2 days if remains hemodynamically stable and mentation is improved and able to follow commands, PCCM on board Case discussed with pulmonologist Dr. Lake Bells on 06/03/2018 & Dr Nelda Marseille on 06/04/18.  Vent Mode: PRVC FiO2 (%):  [30 %-40 %] 30 % Set Rate:  [16 bmp] 16 bmp Vt Set:  [510 mL] 510 mL PEEP:  [5 cmH20] 5 cmH20 Plateau Pressure:  [20 cmH20-23 cmH20] 21 cmH20  ABG    Component Value Date/Time   PHART 7.506  (H) 06/05/2018 0406   PCO2ART 41.5 06/05/2018 0406   PO2ART 110.0 (H) 06/05/2018 0406   HCO3 32.8 (H) 06/05/2018 0406   TCO2 34 (H) 06/05/2018 0406   O2SAT 99.0 06/05/2018 0406    2.  Septic shock.  Blood cultures negative to date presumed to be secondary to COVID-19 infection, initially required pressors through left arm  PICC line.  Now sepsis pathophysiology seems to have resolved.  3.  ARF.  Resolved.  4.  Iron deficiency anemia acute on chronic.  Some element of gradual blood loss due to repeated blood draws, have done type and screen, transfuse if hemoglobin drops below 7.  No signs of overt bleeding.  Continue PPI for prophylaxis.  5.  Hypokalemia and hypomagnesemia. Replaced will monitor with Mag.  6.  Morbid obesity with underlying obstructive sleep apnea.  Outpatient follow-up with PCP.  7.  Nutrition. Tube feedings via G-tube.  Free water flushes ordered.  8.  Hypertension.  Now getting intermittently hypotensive for the last 5 to 7 days prior to coming to [facility], cortisol levels are borderline could have subclinical renal insufficiency, for now with lowest dose Precedex blood pressure is stable, if it drops again will have low threshold of starting her on stress dose IV steroids.  9.  History of CVA.  Currently on Plavix, started on low-dose statin.  10. Dyslipidemia.  Home dose statin replaced.    11.  Short burst of SVT during CPAP trial on 06/02/2018.  Resolved, lowest dose beta-blocker at night, if blood pressure tolerates will increase beta-blocker dose, PRN IV Lopressor also on board. Recent TTE shows an EF of 65% no WMA.  12. Underlying Dementia, 2 recent CVAs - continues to be encephalopathic despite sedation cessation, question if she had another stroke, will check head CT on 06/04/2018 if mentation does not improve.  Stable B12, folate, RPR.  TSH and cortisol were relatively stable.  Also discussed with son in detail on 06/03/2018. If all negative will  check EEG.  13.  Low-grade temperatures for 15 2020.  Chest x-ray nonacute, stable UA and urine along with blood cultures, appears nontoxic continue to monitor closely.  D dimer is high, rule out DVT - is high risk, check Venous US.  14. DM2 - ISS  CBG (last 3)  Recent Labs    06/04/18 2331 06/05/18 0349 06/05/18 0809  GLUCAP 116* 110* 119*      Nutrition.  Tube feedings via G-tube.  Free water flushes ordered.   Condition - Extremely Guarded  Family Communication  : updated son Brain 06/04/18, no Trach, comfort care if she fails extubation.  Code Status :  Partial with No CPR  Diet : TF  Disposition Plan  :  ICU  Consults  :  PCCM  Procedures  :    Endotracheal intubation- 05/23/2018 L.Arm PICC - 05/30/18  Blood cx 06/11/2018- NGTD Novel Coronavirus Positive result called 4/4. Tracheal aspirate 4/7>>> moderate WBC (abundant GPC and GNR)  MRSA 4/5> neg    DVT Prophylaxis  :  Lovenox    Lab Results  Component Value Date   PLT 314 06/05/2018    Inpatient Medications  Scheduled Meds:  atorvastatin  20 mg Per Tube q1800   budesonide (PULMICORT) nebulizer solution  0.25 mg Nebulization BID   chlorhexidine gluconate (MEDLINE KIT)  15 mL Mouth Rinse BID   Chlorhexidine Gluconate Cloth  6 each Topical Daily   clopidogrel  75 mg Per Tube Daily   docusate  50 mg Oral BID   enoxaparin (LOVENOX) injection  60 mg Subcutaneous Q24H   feeding supplement (JEVITY 1.2 CAL)  1,000 mL Per Tube Q24H   feeding supplement (PRO-STAT SUGAR FREE 64)  30 mL Per Tube TID WC   free water  200 mL Per Tube Q6H   insulin aspart  0-15 Units Subcutaneous Q4H  mouth rinse  15 mL Mouth Rinse 10 times per day   metoprolol tartrate  12.5 mg Per Tube BID   pantoprazole sodium  40 mg Per Tube Daily   potassium chloride  40 mEq Oral Once   sennosides  5 mL Oral QHS   silver sulfADIAZINE  1 application Topical Daily   sodium chloride flush  10-40 mL Intracatheter Q12H    sodium chloride flush  3 mL Intravenous Q12H   venlafaxine  37.5 mg Per Tube QHS   venlafaxine  75 mg Per Tube q morning - 10a   Continuous Infusions:  calcium gluconate 1,000 mg (06/05/18 0855)   dexmedetomidine (PRECEDEX) IV infusion Stopped (06/02/18 0800)   dextrose     potassium chloride 10 mEq (06/05/18 0815)   PRN Meds:.acetaminophen, bisacodyl, fentaNYL (SUBLIMAZE) injection, ipratropium, levalbuterol, metoprolol tartrate, midazolam, [DISCONTINUED] ondansetron **OR** ondansetron (ZOFRAN) IV, Resource ThickenUp Clear  Antibiotics  :    Anti-infectives (From admission, onward)   Start     Dose/Rate Route Frequency Ordered Stop   05/27/18 2200  ceFEPIme (MAXIPIME) 2 g in sodium chloride 0.9 % 100 mL IVPB  Status:  Discontinued     2 g 200 mL/hr over 30 Minutes Intravenous Every 8 hours 05/27/18 1322 05/27/18 1400   05/27/18 1200  ceFEPIme (MAXIPIME) 2 g in sodium chloride 0.9 % 100 mL IVPB     2 g 200 mL/hr over 30 Minutes Intravenous  Once 05/27/18 1153 05/27/18 1330   05/26/18 2200  azithromycin (ZITHROMAX) tablet 500 mg  Status:  Discontinued     500 mg Oral Daily 05/26/18 0715 05/26/18 1502   05/26/18 2200  azithromycin (ZITHROMAX) tablet 500 mg     500 mg Per Tube Daily 05/26/18 1502 05/27/18 2141   05/24/18 2200  hydroxychloroquine (PLAQUENIL) tablet 200 mg     200 mg Oral 2 times daily 05/23/18 2113 05/28/18 1025   05/23/18 2300  azithromycin (ZITHROMAX) 500 mg in sodium chloride 0.9 % 250 mL IVPB  Status:  Discontinued     500 mg 250 mL/hr over 60 Minutes Intravenous Every 24 hours 05/23/18 2250 05/26/18 0715   05/23/18 2200  hydroxychloroquine (PLAQUENIL) tablet 400 mg     400 mg Oral 2 times daily 05/23/18 2113 05/24/18 1114   05/23/18 1500  vancomycin (VANCOCIN) 1,250 mg in sodium chloride 0.9 % 250 mL IVPB  Status:  Discontinued     1,250 mg 166.7 mL/hr over 90 Minutes Intravenous Every 24 hours 05/26/2018 1532 05/25/18 1141   05/23/18 0230  ceFEPIme  (MAXIPIME) 2 g in sodium chloride 0.9 % 100 mL IVPB  Status:  Discontinued     2 g 200 mL/hr over 30 Minutes Intravenous Every 12 hours 06/10/2018 1532 05/25/18 1334   06/11/2018 1545  metroNIDAZOLE (FLAGYL) IVPB 500 mg     500 mg 100 mL/hr over 60 Minutes Intravenous  Once 05/23/2018 1531 05/24/2018 1731   06/17/2018 1530  vancomycin (VANCOCIN) IVPB 1000 mg/200 mL premix     1,000 mg 200 mL/hr over 60 Minutes Intravenous  Once 05/24/2018 1444 05/30/2018 1731   06/07/2018 1415  vancomycin (VANCOCIN) IVPB 1000 mg/200 mL premix     1,000 mg 200 mL/hr over 60 Minutes Intravenous  Once 06/16/2018 1401 06/18/2018 1538   05/20/2018 1415  ceFEPIme (MAXIPIME) 2 g in sodium chloride 0.9 % 100 mL IVPB     2 g 200 mL/hr over 30 Minutes Intravenous  Once 06/11/2018 1401 06/14/2018 1509       Time  Spent in minutes  30   Lala Lund M.D on 06/05/2018 at 9:07 AM  To page go to [email or web address] - password [email or web address]  [facility] -  Office  [phone]  Total Critical Care time in examining the patient bedside, evaluating Lab work and other data, over half of the total time was spent in coordinating patient care on the floor or bedisde in talking to patient/family members, communicating with nursing Staff on the floor and sub specialists PCCM  to coordinate patients medical care and needs is  35 Minutes.   The condition which has caused critical injury/acute impairment of Pulmonary vital organ system with a high probability of sudden clinically significant deterioration and can cause Potential Life threatening injury to this patient addressed today is  Acute Hypoxic Resp failure.  See all Orders from today for further details   Admit date - 06/14/2018    14    Objective:   Vitals:   06/05/18 0600 06/05/18 0630 06/05/18 0700 06/05/18 0716  BP: 101/61 110/65 114/74 114/74  Pulse: 93 96 94 91  Resp: 18 (!) 26 (!) 23 (!) 21  Temp:      TempSrc:      SpO2: 99% 100% 99% 100%  Weight:      Height:        Wt  Readings from Last 3 Encounters:  06/05/18 124.6 kg  05/19/18 131.9 kg  05/04/18 127 kg     Intake/Output Summary (Last 24 hours) at 06/05/2018 0907 Last data filed at 06/05/2018 0300 Gross per 24 hour  Intake 406 ml  Output 1750 ml  Net -1344 ml     Physical Exam  Intubated, mildly sedated, opens eyes only to verbal commands, moves all 4 extremities to painful stimuli, ETT,OG, Foley, L arm PICC Camera rounds - actual exam DW PCCM Dr Lavina Hamman   Data Review:    CBC Recent Labs  Lab 06/01/18 0406  06/02/18 0500  06/03/18 0435 06/04/18 0400 06/04/18 0531 06/05/18 0406 06/05/18 0500  WBC 8.3  --  7.3  --  9.9 8.8  --   --  8.0  HGB 7.5*   < > 7.4*   < > 8.1* 8.0* 8.5* 8.8* 8.3*  HCT 26.5*   < > 26.9*   < > 29.3* 28.6* 25.0* 26.0* 30.0*  PLT 265  --  264  --  322 305  --   --  314  MCV 89.8  --  92.1  --  92.1 92.3  --   --  92.6  MCH 25.4*  --  25.3*  --  25.5* 25.8*  --   --  25.6*  MCHC 28.3*  --  27.5*  --  27.6* 28.0*  --   --  27.7*  RDW 20.4*  --  20.8*  --  21.6* 21.6*  --   --  21.6*  LYMPHSABS  --   --  1.1  --  1.1 1.2  --   --  1.1  MONOABS  --   --  0.3  --  0.4 0.3  --   --  0.4  EOSABS  --   --  0.1  --  0.0 0.0  --   --  0.3  BASOSABS  --   --  0.0  --  0.1 0.1  --   --  0.1   < > = values in this interval not displayed.    Chemistries  Recent Labs  Lab 05/29/18 1053  06/01/18 0406  06/01/18 1854  06/02/18 0500  06/03/18 0435 06/04/18 0400 06/04/18 0531 06/05/18 0406 06/05/18 0500  NA 143   < > 146* 145   < > 145   < > 146* 146* 147* 149* 148*  K 4.3   < > 3.0* 4.3   < > 4.3   < > 4.0 4.0 4.2 4.0 2.6*  CL 96*   < > 101 104  --  107  --  108 108  --   --  124*  CO2 34*   < > 32  --   --  31  --  30 31  --   --  21*  GLUCOSE 191*   < > 161* 142*  --  131*  --  119* 129*  --   --  89  BUN 36*   < > 32* 38*  --  44*  --  49* 49*  --   --  37*  CREATININE 1.08*   < > 1.04* 0.90  --  0.97  --  1.02* 1.04*  --   --  0.74  CALCIUM 8.6*   < > 8.4*  --    --  8.0*  --  7.9* 8.1*  --   --  5.4*  MG 2.4  --   --   --   --  2.5*  --   --   --   --   --  1.6*  AST  --    < > 31  --   --  31  --  32 31  --   --  23  ALT  --    < > 21  --   --  20  --  19 20  --   --  14  ALKPHOS  --    < > 96  --   --  135*  --  150* 137*  --   --  82  BILITOT  --    < > 1.2  --   --  1.3*  --  1.2 1.2  --   --  0.9   < > = values in this interval not displayed.   ------------------------------------------------------------------------------------------------------------------ No results for input(s): CHOL, HDL, LDLCALC, TRIG, CHOLHDL, LDLDIRECT in the last 72 hours.  Lab Results  Component Value Date   HGBA1C 5.2 03/02/2018   ------------------------------------------------------------------------------------------------------------------ No results for input(s): TSH, T4TOTAL, T3FREE, THYROIDAB in the last 72 hours.  Invalid input(s): FREET3 ------------------------------------------------------------------------------------------------------------------ Recent Labs    06/04/18 0400  VITAMINB12 969*    Coagulation profile Recent Labs  Lab 06/04/18 1043  INR 1.4*    Recent Labs    06/04/18 0400  DDIMER 2.68*    Cardiac Enzymes Recent Labs  Lab 05/30/18 0327 05/31/18 0521 06/01/18 0406  TROPONINI 0.03* 0.03* <0.03   ------------------------------------------------------------------------------------------------------------------    Component Value Date/Time   BNP 2,117.1 (H) 06/08/2018 1402    Micro Results Recent Results (from the past 240 hour(s))  Culture, respiratory (non-expectorated)     Status: None   Collection Time: 05/26/18  9:35 AM  Result Value Ref Range Status   Specimen Description TRACHEAL ASPIRATE  Final   Special Requests NONE  Final   Gram Stain   Final    MODERATE WBC PRESENT,BOTH PMN AND MONONUCLEAR FEW SQUAMOUS EPITHELIAL CELLS PRESENT ABUNDANT GRAM POSITIVE COCCI ABUNDANT GRAM NEGATIVE RODS FEW GRAM  POSITIVE RODS    Culture   Final    MODERATE Consistent with normal respiratory  flora. Performed at [facility], [place] [address], [place], [place] [address]    Report Status 05/28/2018 FINAL  Final  Culture, Urine     Status: None   Collection Time: 06/03/18  1:48 PM  Result Value Ref Range Status   Specimen Description   Final    URINE, CATHETERIZED Performed at [place] [address] [place], [place] [address]    Special Requests   Final    NONE Performed at [facility], [place] [address] [place], [place] [address]    Culture   Final    NO GROWTH Performed at [facility], [place] [address] [place], [place] [address]    Report Status 06/04/2018 FINAL  Final  Culture, blood (routine x 2)     Status: None (Preliminary result)   Collection Time: 06/03/18  6:15 PM  Result Value Ref Range Status   Specimen Description   Final    BLOOD BLOOD RIGHT HAND Performed at [place] [address], [place], [place] [address]    Special Requests   Final    BOTTLES DRAWN AEROBIC AND ANAEROBIC Blood Culture adequate volume Performed at [facility] [address], [place], [place] [address]    Culture  Setup Time   Final    ANAEROBIC BOTTLE ONLY GRAM POSITIVE COCCI CRITICAL RESULT CALLED TO, READ BACK BY AND VERIFIED WITH: J.MIZE,RN AT 0022 ON 06/05/18 BY G.MCADOO Performed at [facility], [place] [address], [place],  [address]    Culture GRAM POSITIVE COCCI  Final   Report Status PENDING  Incomplete  Blood Culture ID Panel (Reflexed)     Status: Abnormal   Collection Time: 06/03/18  6:15 PM  Result Value Ref Range Status   Enterococcus species NOT DETECTED NOT DETECTED Final   Listeria monocytogenes NOT DETECTED NOT DETECTED Final   Staphylococcus species DETECTED (A) NOT DETECTED Final    Comment: Methicillin (oxacillin) resistant coagulase negative staphylococcus. Possible  blood culture contaminant (unless isolated from more than one blood culture draw or clinical case suggests pathogenicity). No antibiotic treatment is indicated for blood  culture contaminants. CRITICAL RESULT CALLED TO, READ BACK BY AND VERIFIED WITH: J.MIZE,RN AT 0022 ON 06/05/18 BY G.MCADOO    Staphylococcus aureus (BCID) NOT DETECTED NOT DETECTED Final   Methicillin resistance DETECTED (A) NOT DETECTED Final    Comment: CRITICAL RESULT CALLED TO, READ BACK BY AND VERIFIED WITH: J.MIZE,RN AT 0022 ON 06/05/18 BY G.MCADOO    Streptococcus species NOT DETECTED NOT DETECTED Final   Streptococcus agalactiae NOT DETECTED NOT DETECTED Final   Streptococcus pneumoniae NOT DETECTED NOT DETECTED Final   Streptococcus pyogenes NOT DETECTED NOT DETECTED Final   Acinetobacter baumannii NOT DETECTED NOT DETECTED Final   Enterobacteriaceae species NOT DETECTED NOT DETECTED Final   Enterobacter cloacae complex NOT DETECTED NOT DETECTED Final   Escherichia coli NOT DETECTED NOT DETECTED Final   Klebsiella oxytoca NOT DETECTED NOT DETECTED Final   Klebsiella pneumoniae NOT DETECTED NOT DETECTED Final   Proteus species NOT DETECTED NOT DETECTED Final   Serratia marcescens NOT DETECTED NOT DETECTED Final   Haemophilus influenzae NOT DETECTED NOT DETECTED Final   Neisseria meningitidis NOT DETECTED NOT DETECTED Final   Pseudomonas aeruginosa NOT DETECTED NOT DETECTED Final   Candida albicans NOT DETECTED NOT DETECTED Final   Candida glabrata NOT DETECTED NOT DETECTED Final   Candida krusei NOT DETECTED NOT DETECTED Final   Candida  parapsilosis NOT DETECTED NOT DETECTED Final   Candida tropicalis NOT DETECTED NOT DETECTED Final    Comment: Performed at [facility], [place] [address], [place], [place] [address]  Culture, blood (routine x 2)     Status: None (Preliminary result)   Collection Time: 06/03/18  6:30 PM  Result Value Ref Range Status   Specimen Description   Final    BLOOD BLOOD RIGHT  WRIST Performed at [place] [address] [place], [place] [address]    Special Requests   Final    BOTTLES DRAWN AEROBIC ONLY Blood Culture adequate volume Performed at [facility] [address], [place], [place] [address]    Culture  Setup Time   Final    AEROBIC BOTTLE ONLY GRAM POSITIVE COCCI CRITICAL VALUE NOTED.  VALUE IS CONSISTENT WITH PREVIOUSLY REPORTED AND CALLED VALUE. Performed at [facility], [place] [address], [place], [place] [address]    Culture PENDING  Incomplete   Report Status PENDING  Incomplete    Radiology Reports  Ct Chest Wo Contrast  Result Date: 06/11/2018 CLINICAL DATA:  68 year old female with shortness of breath, fever. Patient also has a history of congestive heart failure. EXAM: CT CHEST WITHOUT CONTRAST TECHNIQUE: Multidetector CT imaging of the chest was performed following the standard protocol without IV contrast. COMPARISON:  Chest x-ray obtained earlier today; prior chest x-ray 05/19/18 FINDINGS: Cardiovascular: Limited evaluation in the absence of intravenous contrast. Cardiomegaly. Calcification of the mitral valve annulus. Coronary artery calcifications also present. Atherosclerotic calcifications throughout the aorta. No pericardial effusion. Mediastinum/Nodes: Thyroid gland is unremarkable. Borderline enlarged mediastinal lymph nodes. Index right paratracheal lymph node measures 1.2 cm in short axis. Index low right paratracheal lymph node measures 1.2 cm in short axis. Unremarkable thoracic esophagus. Lungs/Pleura: Extensive patchy ground-glass and more consolidative airspace opacities in a peripheral and peribronchovascular distribution throughout all lobes of both lungs but preferentially within the upper lobes. There is relative sparing of the middle lobe. Somewhat less significant disease within the dependent portions of the right lower lobe makes aspiration less likely. Additionally, there is mild  bilateral lower lobe atelectasis secondary to the presence of small bilateral pleural effusions. Upper Abdomen: Small volume perihepatic ascites. Relative right renal atrophy. Musculoskeletal: No acute fracture or aggressive appearing lytic or blastic osseous lesion. IMPRESSION: 1. There are a spectrum of findings in the lungs which can be seen with acute atypical infection (as well as other non-infectious etiologies). In particular, viral pneumonia (including COVID-19) should be considered in the appropriate clinical setting. Given the upper lung predominant pattern of distribution, aspiration is considered significantly less likely despite the patient's underlying risk factors. Similarly, congestive heart failure is considered less likely given the absence of interlobular septal thickening. 2. Small to moderate bilateral layering pleural effusions. 3. Small volume perihepatic site ease of uncertain etiology. 4. Borderline enlarged mediastinal lymph nodes are favored to be reactive. 5. Coronary artery calcifications. 6.  Aortic Atherosclerosis (ICD10-170.0). 7. Relative right renal atrophy. These results were called by telephone at the time of interpretation on 06/12/2018 at 4:42 pm to Dr. Evangeline Gula, who verbally acknowledged these results. Electronically Signed   By: Jacqulynn Cadet M.D.   On: 06/06/2018 16:43   Dg Chest Port 1 View  Result Date: 06/04/2018 CLINICAL DATA:  Shortness of breath. EXAM: PORTABLE CHEST 1 VIEW COMPARISON:  06/02/2018 FINDINGS: Endotracheal tube has tip 3.6 cm above the carina. Enteric tube courses into the region of the stomach  and off the film as tip not visualized. Left-sided PICC line unchanged. Lungs are adequately inflated demonstrate hazy bilateral perihilar opacification with slight interval worsening over the right upper perihilar region. Findings may be due to interstitial edema versus infection. Stable bibasilar opacification likely small effusions with atelectasis. Stable  cardiomegaly. Remainder the exam is unchanged. IMPRESSION: Hazy bilateral perihilar opacification with interval worsening over the right upper perihilar region. Findings may be due to interstitial edema versus infection. Stable bibasilar opacification likely small effusions with atelectasis. Stable cardiomegaly. Tubes and lines as described. Electronically Signed   By: Marin Olp M.D.   On: 06/04/2018 09:04   Dg Chest Port 1 View  Result Date: 06/02/2018 CLINICAL DATA:  Short of breath EXAM: PORTABLE CHEST 1 VIEW COMPARISON:  05/30/2018 FINDINGS: Endotracheal and NG tubes are stable. Left PICC placed. Tip is at the cavoatrial junction. Diffuse airspace disease and bilateral pleural effusions are stable. Cardiac silhouette is prominent likely due to AP technique. No pneumothorax. IMPRESSION: Stable diffuse bilateral airspace disease and bilateral pleural effusions. Left PICC placed.  Tip is at the cavoatrial junction. Electronically Signed   By: Marybelle Killings M.D.   On: 06/02/2018 07:40   Dg Chest Port 1 View  Result Date: 05/30/2018 CLINICAL DATA:  ARDS EXAM: PORTABLE CHEST 1 VIEW COMPARISON:  05/28/2018 FINDINGS: Endotracheal tube in good position.  NG tube in the stomach. Cardiac enlargement. Extensive diffuse bilateral airspace disease unchanged. Bilateral pleural effusions and bibasilar atelectasis unchanged. IMPRESSION: Diffuse bilateral airspace disease and bilateral effusions unchanged. Electronically Signed   By: Franchot Gallo M.D.   On: 05/30/2018 08:14   Dg Chest Port 1 View  Result Date: 05/28/2018 CLINICAL DATA:  68 year old female with ARDS EXAM: PORTABLE CHEST 1 VIEW COMPARISON:  Multiple prior most recent 05/27/2018, 05/23/2018, CT 06/11/2018 FINDINGS: Cardiomediastinal silhouette unchanged in size and contour, with cardiomegaly. Endotracheal tube unchanged terminating 2.7 cm above the carina. Unchanged gastric tube terminating out of the field of view. Low lung volumes with veil  opacities at the bilateral lung bases obscuring the hemidiaphragm. Patchy airspace and interstitial opacities bilaterally, similar to the comparison. No pneumothorax. IMPRESSION: Unchanged endotracheal tube and gastric tube. Similar appearance of mixed interstitial and airspace opacities of the bilateral lungs, with bibasilar pleural effusions. Electronically Signed   By: Corrie Mckusick D.O.   On: 05/28/2018 07:53   Dg Chest Port 1 View  Result Date: 05/27/2018 CLINICAL DATA:  68 year old female with respiratory failure EXAM: PORTABLE CHEST 1 VIEW COMPARISON:  05/23/2018, 06/03/2018, 05/19/2018 FINDINGS: Cardiomediastinal silhouette unchanged in size and contour. Endotracheal tube terminates 2.7 cm above the carina. Unchanged gastric tube, with the tip terminating just below the diaphragm. No pneumothorax. Worsening airspace opacity in the left lung and right mid lung with veil opacity at the lung bases obscuring the hemidiaphragms and heart borders. Interval removal of the defibrillator pads. IMPRESSION: Unchanged endotracheal tube and gastric tube. Persisting interstitial and airspace opacity of the lungs, worsening on the left. Small bilateral pleural effusions and associated atelectasis/consolidation. Electronically Signed   By: Corrie Mckusick D.O.   On: 05/27/2018 08:02   Dg Chest Port 1 View  Result Date: 05/23/2018 CLINICAL DATA:  Intubation EXAM: PORTABLE CHEST 1 VIEW COMPARISON:  06/03/2018 FINDINGS: Endotracheal tube tip is 3 cm above the carina. Orogastric or nasogastric tube enters the abdomen. The tip may be just beyond the gastroesophageal junction. Widespread bilateral pulmonary infiltrates persist. No worsening or new finding by radiography. IMPRESSION: Endotracheal tube 3 cm above the carina. Orogastric  or nasogastric tube enters the abdomen, just beyond the gastroesophageal junction. Widespread bilateral infiltrates persist. Electronically Signed   By: Nelson Chimes M.D.   On: 05/23/2018 21:38     Dg Chest Port 1 View  Result Date: 06/05/2018 CLINICAL DATA:  Shortness of breath since yesterday. Tachycardia. Former smoker. EXAM: PORTABLE CHEST 1 VIEW COMPARISON:  05/19/2018 FINDINGS: Lungs are adequately inflated and demonstrate worsening of bilateral multifocal airspace opacification which is most prominent over the right upper lobe and left perihilar region. Possible small amount left pleural fluid unchanged. Mild stable cardiomegaly. Remainder the exam is unchanged. IMPRESSION: Interval worsening bilateral multifocal airspace process most prominent over the right upper lobe and left perihilar region likely multifocal pneumonia. Small amount left pleural fluid unchanged. Mild stable cardiomegaly. Electronically Signed   By: Marin Olp M.D.   On: 06/17/2018 14:29   Dg Chest Port 1 View  Result Date: 05/19/2018 CLINICAL DATA:  Respiratory failure and congestive heart failure. EXAM: PORTABLE CHEST 1 VIEW COMPARISON:  05/18/2018 FINDINGS: Stable cardiac enlargement. Slight diminishment in CHF since the prior study. There is some asymmetric vascular congestion versus atelectasis in the right upper lung. Stable left basilar atelectasis and probable component of small to moderate left pleural effusion. IMPRESSION: Decreased prominence of CHF since the prior chest x-ray. Asymmetric vascular congestion versus atelectasis in the right upper lung. Left basilar atelectasis with probable component of left pleural fluid. Electronically Signed   By: Aletta Edouard M.D.   On: 05/19/2018 08:27   Dg Chest Port 1 View  Result Date: 05/18/2018 CLINICAL DATA:  Short of breath EXAM: PORTABLE CHEST 1 VIEW COMPARISON:  05/17/2018 FINDINGS: Stable enlarged cardiac silhouette. Bilateral pleural effusions similar prior. A central venous congestion. No focal consolidation. No pneumothorax. IMPRESSION: Cardiomegaly with small bilateral pleural effusions. Concern for mild congestive heart failure Electronically Signed    By: Suzy Bouchard M.D.   On: 05/18/2018 08:16   Dg Chest Port 1 View  Result Date: 05/17/2018 CLINICAL DATA:  Shortness of breath EXAM: PORTABLE CHEST 1 VIEW COMPARISON:  05/16/2018 FINDINGS: Cardiomegaly with mild perihilar edema and small bilateral pleural effusions, grossly unchanged. No pneumothorax. IMPRESSION: Cardiomegaly mild interstitial edema and small bilateral pleural effusions, grossly unchanged. Electronically Signed   By: Julian Hy M.D.   On: 05/17/2018 08:11   Dg Chest Port 1 View  Result Date: 05/16/2018 CLINICAL DATA:  Shortness of breath EXAM: PORTABLE CHEST 1 VIEW COMPARISON:  05/15/2018 FINDINGS: Cardiomegaly with mild perihilar edema. Small to moderate bilateral pleural effusions, increased. No pneumothorax. IMPRESSION: Cardiomegaly with mild perihilar edema and small to moderate bilateral pleural effusions, increased. Electronically Signed   By: Julian Hy M.D.   On: 05/16/2018 07:41   Dg Chest Port 1 View  Result Date: 05/15/2018 CLINICAL DATA:  Shortness of breath EXAM: PORTABLE CHEST 1 VIEW COMPARISON:  May 14, 2018 FINDINGS: There is persistent cardiomegaly. Pulmonary vascularity is within normal limits. Currently there is no appreciable edema or consolidation. No adenopathy. No bone lesions. There is aortic atherosclerosis. IMPRESSION: Cardiomegaly. No edema or consolidation. Pulmonary vascularity within normal limits. Aortic Atherosclerosis (ICD10-I70.0). Electronically Signed   By: Lowella Grip III M.D.   On: 05/15/2018 07:35   Dg Chest Port 1 View  Result Date: 05/14/2018 CLINICAL DATA:  Aspiration EXAM: PORTABLE CHEST 1 VIEW COMPARISON:  May 14, 2018 study obtained earlier in the day FINDINGS: There is cardiomegaly with mild pulmonary venous hypertension. There are pleural effusions bilaterally. There is no appreciable edema or consolidation. There  is aortic atherosclerosis. No adenopathy. No bone lesions. IMPRESSION: Pulmonary vascular  congestion with small pleural effusions bilaterally. No frank edema or consolidation. Aortic Atherosclerosis (ICD10-I70.0). Electronically Signed   By: Lowella Grip III M.D.   On: 05/14/2018 16:00   Dg Chest Port 1 View  Result Date: 05/14/2018 CLINICAL DATA:  Short of breath EXAM: PORTABLE CHEST 1 VIEW COMPARISON:  05/06/2018 FINDINGS: Cardiac enlargement with mild progression of vascular congestion. Progression of bibasilar atelectasis and small effusions. Atherosclerotic ascending aorta and aortic arch. IMPRESSION: Congestive heart failure with mild progression. Electronically Signed   By: Franchot Gallo M.D.   On: 05/14/2018 08:31   Dg Chest Port 1 View  Result Date: 05/06/2018 CLINICAL DATA:  Dyspnea. Bilateral leg swelling. EXAM: PORTABLE CHEST 1 VIEW COMPARISON:  01/21/2018. FINDINGS: Poor inspiration. No gross change in borderline enlargement of the cardiac silhouette and mild prominence of the pulmonary vasculature and interstitial markings. No visible pleural fluid. Thoracic spine degenerative changes. IMPRESSION: Stable borderline cardiomegaly, mild pulmonary vascular congestion and mild chronic interstitial lung disease. Electronically Signed   By: Claudie Revering M.D.   On: 05/06/2018 18:07   [place] Ekg Site Rite  Result Date: 05/30/2018 If Site Rite image not attached, placement could not be confirmed due to current cardiac rhythm.  [place] Ekg Site Rite  Result Date: 05/30/2018 If Site Rite image not attached, placement could not be confirmed due to current cardiac rhythm.  US Abdomen Limited Ruq  Result Date: 05/14/2018 CLINICAL DATA:  Elevated LFTs. EXAM: ULTRASOUND ABDOMEN LIMITED RIGHT UPPER QUADRANT COMPARISON:  None. FINDINGS: Gallbladder: Physiologically distended. No gallstones. No significant gallbladder wall thickening. No sonographic Murphy sign noted by sonographer. Common bile duct: Diameter: 5 mm. Liver: No focal lesion identified. Diffusely heterogeneous and slightly  increased in parenchymal echogenicity. Portal vein is patent on color Doppler imaging with normal direction of blood flow towards the liver. Small amount of right upper quadrant ascites. Right pleural effusion noted. IMPRESSION: 1. Mild hepatic steatosis. 2. No gallstones.  No biliary dilatation. 3. Small volume of ascites.  Right pleural effusion. Electronically Signed   By: Keith Rake M.D.   On: 05/14/2018 22:18

## 2018-06-05 NOTE — TOC Progression Note (Signed)
Transition of Care [facility]) - Progression Note    Patient Details  Name: Shannon Carroll MRN: [id number] Date of Birth: 10-05-1950  Transition of [facility]) CM/SW Contact  Loletha Grayer Beverely Pace, RN Phone Number: 06/05/2018, 3:13 PM  Clinical Narrative:       Expected Discharge Plan: Skilled Nursing Facility(From [place], prior to that from [place]) Barriers to Discharge: Barriers Unresolved (comment)  Expected Discharge Plan and Services Expected Discharge Plan: Skilled Nursing Facility(From [place], prior to that from [place])       Living arrangements for the past 2 months: Skilled Nursing Facility(In [place] from 3/31 through this admit, prior to that she lived with her husband in an ALF)                           Social Determinants of Health (SDOH) Interventions    Readmission Risk Interventions No flowsheet data found.

## 2018-06-05 NOTE — Care Management (Signed)
Case manager and social worker will continue to monitor patient for disposition when she has improved medically. May God Bless her.     Ricki Miller, RN Case Manager [phone]

## 2018-06-05 NOTE — Progress Notes (Signed)
Lab reports blood cultures were positive for Staphylococcus and methicillin resistance. Reported to MD.

## 2018-06-05 NOTE — Progress Notes (Signed)
Occupational Therapy Treatment Patient Details Name: Shannon Carroll MRN: [id number] DOB: 1950/03/10 Today's Date: 06/05/2018    History of present illness 68 y.o. female recently d/c from the hospital on 05/19/18 for CHF exacerbation to [facility] for rehab.  She was re-admitted on 06/02/2018 for SOB and hypoxia.  Pt dx with COVID-19, septic shock, ARF, iron deficient anemia, and short bursts of SVT.  Intubated 05/23/18-still currently intubated.  Pt with significant PMH of stroke (presumed L deficits), obesity, HTN, DM, and CKD (stage 2).   OT comments  Pt on similar vent settings as last session, PRVC mode, FiO2 30%%, PEEP 5, RR 16, O2 sats 100%, HR 100s. Pt not alert; unable to arouse pt; only demonstrates withdrawal to pain this session. However, pt with eyes open while treating another pt in the pod.  Recommend palliative care consult. Recommend B Prevalon boots to improve positioning of B feet and reduce pressure on heels.  Recommend "Chair Position" BID.  Will continue to follow acutely as appropriate.   Follow Up Recommendations  SNF(pending vent wean)    Equipment Recommendations  None recommended by OT    Recommendations for Other Services      Precautions / Restrictions Precautions Precautions: Fall Precaution Comments: ET tube; NG Required Braces or Orthoses: Other Brace(L palm guard) Restrictions Weight Bearing Restrictions: No       Mobility Bed Mobility Overal bed mobility: Needs Assistance             General bed mobility comments: Total A +2 required  Transfers                      Balance                                           ADL either performed or assessed with clinical judgement   ADL                                         General ADL Comments: total A     Vision   Additional Comments: eyes closed entire session   Perception     Praxis      Cognition Arousal/Alertness: Lethargic Behavior  During Therapy: Flat affect Overall Cognitive Status: Difficult to assess                                 General Comments: Opens eyes to name inconsistently; eyes closed majority of session; pt was alert prior to session and most of day per RN; has not had any sedating meds today per RN        Exercises General Exercises - Upper Extremity Shoulder Flexion: PROM;Both;10 reps;Supine Shoulder ABduction: Both;PROM;10 reps;Supine Elbow Flexion: PROM;10 reps;Supine;Both Elbow Extension: PROM;Both;10 reps;Supine Wrist Flexion: PROM;Both;10 reps Wrist Extension: PROM;Both;10 reps;Supine Digit Composite Flexion: PROM;Both;10 reps;Supine Composite Extension: PROM;Both;10 reps;Supine General Exercises - Lower Extremity    Shoulder Instructions       General Comments      Pertinent Vitals/ Pain       Pain Assessment: Faces Faces Pain Scale: Hurts little more Pain Location: pulls L hand away intermittently with ROM Pain Descriptors / Indicators: Grimacing;Other (Comment) Pain Intervention(s): Limited activity within patient's tolerance  Home Living                                          Prior Functioning/Environment              Frequency  Min 2X/week        Progress Toward Goals  OT Goals(current goals can now be found in the care plan section)  Progress towards OT goals: Not progressing toward goals - comment(level of arousal)  Acute Rehab OT Goals Patient Stated Goal: unable to state OT Goal Formulation: Patient unable to participate in goal setting Time For Goal Achievement: 06/18/18 Potential to Achieve Goals: Fair ADL Goals Pt Will Transfer to Toilet: with mod assist;stand pivot transfer;bedside commode Pt/caregiver will Perform Home Exercise Program: Both right and left upper extremity;With theraband;With written HEP provided;With Supervision Additional ADL Goal #1: Pt will consistently follow 50% commands Additional ADL Goal  #2: Pt will tolerate EOB with +2 Max A x 10 min Additional ADL Goal #3: Pt will complete simple grooming task bed level with max A  Plan Discharge plan remains appropriate    Co-evaluation    PT/OT/SLP Co-Evaluation/Treatment: Yes Reason for Co-Treatment: Complexity of the patient's impairments (multi-system involvement) PT goals addressed during session: Strengthening/ROM OT goals addressed during session: Strengthening/ROM      AM-PAC OT "6 Clicks" Daily Activity     Outcome Measure   Help from another person eating meals?: Total Help from another person taking care of personal grooming?: Total Help from another person toileting, which includes using toliet, bedpan, or urinal?: Total Help from another person bathing (including washing, rinsing, drying)?: Total Help from another person to put on and taking off regular upper body clothing?: Total Help from another person to put on and taking off regular lower body clothing?: Total 6 Click Score: 6    End of Session Equipment Utilized During Treatment: Other (comment)(vent)  OT Visit Diagnosis: Other abnormalities of gait and mobility (R26.89);Muscle weakness (generalized) (M62.81);Other symptoms and signs involving cognitive function   Activity Tolerance Patient limited by lethargy   Patient Left in bed;with call bell/phone within reach;with nursing/sitter in room   Nurse Communication Mobility status;Other (comment)(L palm guard)        Time: 4580-9983 OT Time Calculation (min): 24 min  Charges: OT General Charges $OT Visit: 1 Visit OT Treatments $Therapeutic Activity: 8-22 mins  Maurie Boettcher, OT/L   Acute OT Clinical Specialist [place] Pager [phone] Office [phone]    [facility] 06/05/2018, 4:58 PM

## 2018-06-05 NOTE — Progress Notes (Signed)
Critical lab values called at [phone]. Potassium 2.6. Calcium 5.4. Contacting MD. Continue to monitor.

## 2018-06-05 NOTE — Progress Notes (Signed)
NAME:  Shannon Carroll, MRN:  [id number], DOB:  1950-09-08, LOS: 45 ADMISSION DATE:  06/01/2018, CONSULTATION DATE:  05/23/2018 REFERRING MD:  Glenna Durand, CHIEF COMPLAINT:  SVT hpoxemia, COVID 9 POS   Brief History   68 yr old female with PMHx HFpEF, COPD, w/ bilateral infiltrates COVID positive on supplemental oxygen 8L with increased work of breathing, fatigue, and diaphoretic. PCCM consulted for acute respiratory distress.  Significant Hospital Events   4/3 + COVID -19  4/4 ICU admit intubated  4/12 weaning trial, failed, de-sat, SVT  4/15 episodes of SVT  Consults:  PCCM   Procedures:  Endotracheal intubation- 05/23/2018  Significant Diagnostic Tests:  Bilateral alveolar infiltrates on CXR -CXR 4/8 with worsening LLL and RML opacity   Micro Data:  Blood cx 05/23/2018- NGTD Novel Coronavirus Positive result called 4/4. Tracheal aspirate 4/7>>> moderate WBC (abundant GPC and GNR)  MRSA 4/5> neg   Antimicrobials:  Vancomycin- 05/23/2018 Cefepime- 05/23/2018-4/6, 4/8>>  Hydroxychloroquine- upon transfer to ICU -COVID (to end 4/9) Axithromycin - upon transfer to ICU- COVID (to end 4/8)  Interim history/subjective:  Tolerating wean this AM, no further events  Objective   Blood pressure 112/88, pulse 86, temperature 100 F (37.8 C), temperature source Oral, resp. rate (!) 22, height 5\' 8"  (1.727 m), weight 124.6 kg, SpO2 98 %.    Vent Mode: PRVC FiO2 (%):  [30 %-40 %] 30 % Set Rate:  [16 bmp] 16 bmp Vt Set:  [510 mL] 510 mL PEEP:  [5 cmH20] 5 cmH20 Plateau Pressure:  [20 cmH20-23 cmH20] 21 cmH20   Intake/Output Summary (Last 24 hours) at 06/05/2018 1029 Last data filed at 06/05/2018 1000 Gross per 24 hour  Intake 949.53 ml  Output 1750 ml  Net -800.47 ml   Filed Weights   06/03/18 0426 06/04/18 0500 06/05/18 0410  Weight: 126.5 kg 126.3 kg 124.6 kg   Examination:   General:  Acutely ill appearing female, NAD HENT: [place]/AT, PERRL, EOM-I and MMM PULM: CTA bilaterally CV:  RRR, Nl S1/S2 and -M/R/G GI: Soft, NT, ND and +BS MSK: normal bulk and tone Neuro: Opens eyes but not following commands  I reviewed CXR myself, ETT is in a good position  Resolved Hospital Problem list   Transaminits.  Septic shock  Assessment & Plan:   Acute Hypoxic Respiratory Failure on mechanical ventilation  -ARDS secondary to  COVID-19 viral pneumonia Continue volume negative Maintain on PS as tolerated Titrate FiO2 and PEEPfor sat of 88-92% VAP protocol If fails will need to consider trach next week when supplies are available  Baseline HFpEF - Diureses as BP allows  COPD: - Continue Xopenex/Atrovent and Pulmicort nebulized  Acute toxic metabolic delirium - Minimize sedation  Hypokalemia: - Replace K - BMET in AM  Best practice:  Diet:tube feed Pain/Anxiety/Delirium protocol (if indicated): Continuous infusion VAP protocol (if indicated): NA DVT prophylaxis: SCD, Lovenox GI prophylaxis: Protonix Glucose control: sliding scale Mobility: bed bound Code Status: Partial code - REMAINS NO CPR, but if [place] OK IF WE PUT BACK ETT Family Communication: I spoke with patients son Disposition: ICU   Labs   CBC: Recent Labs  Lab 06/01/18 0406  06/02/18 0500  06/03/18 0435 06/04/18 0400 06/04/18 0531 06/05/18 0406 06/05/18 0500  WBC 8.3  --  7.3  --  9.9 8.8  --   --  8.0  NEUTROABS  --   --  5.8  --  8.3* 7.1  --   --  6.2  HGB 7.5*   < > 7.4*   < > 8.1* 8.0* 8.5* 8.8* 8.3*  HCT 26.5*   < > 26.9*   < > 29.3* 28.6* 25.0* 26.0* 30.0*  MCV 89.8  --  92.1  --  92.1 92.3  --   --  92.6  PLT 265  --  264  --  322 305  --   --  314   < > = values in this interval not displayed.    Basic Metabolic Panel: Recent Labs  Lab 05/29/18 1053  06/01/18 0406 06/01/18 1854  06/02/18 0500  06/03/18 0435 06/04/18 0400 06/04/18 0531 06/05/18 0406 06/05/18 0500  NA 143   < > 146* 145   < > 145   < > 146* 146* 147* 149* 148*  K 4.3   < > 3.0* 4.3    < > 4.3   < > 4.0 4.0 4.2 4.0 2.6*  CL 96*   < > 101 104  --  107  --  108 108  --   --  124*  CO2 34*   < > 32  --   --  31  --  30 31  --   --  21*  GLUCOSE 191*   < > 161* 142*  --  131*  --  119* 129*  --   --  89  BUN 36*   < > 32* 38*  --  44*  --  49* 49*  --   --  37*  CREATININE 1.08*   < > 1.04* 0.90  --  0.97  --  1.02* 1.04*  --   --  0.74  CALCIUM 8.6*   < > 8.4*  --   --  8.0*  --  7.9* 8.1*  --   --  5.4*  MG 2.4  --   --   --   --  2.5*  --   --   --   --   --  1.6*  PHOS 4.4  --   --   --   --   --   --   --   --   --   --   --    < > = values in this interval not displayed.   GFR: Estimated Creatinine Clearance: 95 mL/min (by C-G formula based on SCr of 0.74 mg/dL). Recent Labs  Lab 05/30/18 0327  05/31/18 0523 06/01/18 0406 06/02/18 0500 06/03/18 0435 06/04/18 0400 06/05/18 0500  WBC 7.9   < >  --  8.3 7.3 9.9 8.8 8.0  LATICACIDVEN 1.1  --  0.8 1.0  --   --   --   --    < > = values in this interval not displayed.    Liver Function Tests: Recent Labs  Lab 06/01/18 0406 06/02/18 0500 06/03/18 0435 06/04/18 0400 06/05/18 0500  AST 31 31 32 31 23  ALT 21 20 19 20 14   ALKPHOS 96 135* 150* 137* 82  BILITOT 1.2 1.3* 1.2 1.2 0.9  PROT 6.0* 6.4* 6.8 6.8 4.7*  ALBUMIN 2.0* 2.3* 2.4* 2.4* 1.7*   No results for input(s): LIPASE, AMYLASE in the last 168 hours. No results for input(s): AMMONIA in the last 168 hours.  ABG    Component Value Date/Time   PHART 7.506 (H) 06/05/2018 0406   PCO2ART 41.5 06/05/2018 0406   PO2ART 110.0 (H) 06/05/2018 0406   HCO3 32.8 (H) 06/05/2018 0406   TCO2 34 (H)  06/05/2018 0406   O2SAT 99.0 06/05/2018 0406     Coagulation Profile: Recent Labs  Lab 06/04/18 1043  INR 1.4*    Cardiac Enzymes: Recent Labs  Lab 05/30/18 0327 05/31/18 0521 06/01/18 0406  TROPONINI 0.03* 0.03* <0.03    HbA1C: Hgb A1c MFr Bld  Date/Time Value Ref Range Status  03/02/2018 04:43 PM 5.2 4.8 - 5.6 % Final    Comment:              Prediabetes: 5.7 - 6.4          Diabetes: >6.4          Glycemic control for adults with diabetes: <7.0     CBG: Recent Labs  Lab 06/04/18 1601 06/04/18 2033 06/04/18 2331 06/05/18 0349 06/05/18 0809  GLUCAP 97 108* 116* 110* 119*   The patient is critically ill with multiple organ systems failure and requires high complexity decision making for assessment and support, frequent evaluation and titration of therapies, application of advanced monitoring technologies and extensive interpretation of multiple databases.   Critical Care Time devoted to patient care services described in this note is  32  Minutes. This time reflects time of care of this signee Dr Jennet Maduro. This critical care time does not reflect procedure time, or teaching time or supervisory time of PA/NP/Med student/Med Resident etc but could involve care discussion time.  Rush Farmer, M.D. [facility] Pulmonary/Critical Care Medicine. Pager: [phone]. After hours pager: [phone].

## 2018-06-06 ENCOUNTER — Inpatient Hospital Stay (HOSPITAL_COMMUNITY): Payer: Medicare Other

## 2018-06-06 DIAGNOSIS — R7881 Bacteremia: Secondary | ICD-10-CM

## 2018-06-06 DIAGNOSIS — J9621 Acute and chronic respiratory failure with hypoxia: Secondary | ICD-10-CM

## 2018-06-06 LAB — CULTURE, BLOOD (ROUTINE X 2): Special Requests: ADEQUATE

## 2018-06-06 LAB — CBC WITH DIFFERENTIAL/PLATELET
Band Neutrophils: 0 %
Basophils Absolute: 0.1 10*3/uL (ref 0.0–0.1)
Basophils Relative: 1 %
Blasts: 0 %
Eosinophils Absolute: 0.2 10*3/uL (ref 0.0–0.5)
Eosinophils Relative: 2 %
HCT: 26.9 % — ABNORMAL LOW (ref 36.0–46.0)
Hemoglobin: 7.6 g/dL — ABNORMAL LOW (ref 12.0–15.0)
Lymphocytes Relative: 14 %
Lymphs Abs: 1.1 10*3/uL (ref 0.7–4.0)
MCH: 26.1 pg (ref 26.0–34.0)
MCHC: 28.3 g/dL — ABNORMAL LOW (ref 30.0–36.0)
MCV: 92.4 fL (ref 80.0–100.0)
Metamyelocytes Relative: 0 %
Monocytes Absolute: 0.4 10*3/uL (ref 0.1–1.0)
Monocytes Relative: 5 %
Myelocytes: 0 %
Neutro Abs: 6 10*3/uL (ref 1.7–7.7)
Neutrophils Relative %: 78 %
Other: 0 %
Platelets: 280 10*3/uL (ref 150–400)
Promyelocytes Relative: 0 %
RBC: 2.91 MIL/uL — ABNORMAL LOW (ref 3.87–5.11)
RDW: 21.8 % — ABNORMAL HIGH (ref 11.5–15.5)
WBC: 7.8 10*3/uL (ref 4.0–10.5)
nRBC: 0 % (ref 0.0–0.2)
nRBC: 0 /100 WBC

## 2018-06-06 LAB — POCT I-STAT 7, (LYTES, BLD GAS, ICA,H+H)
Acid-Base Excess: 5 mmol/L — ABNORMAL HIGH (ref 0.0–2.0)
Bicarbonate: 28.9 mmol/L — ABNORMAL HIGH (ref 20.0–28.0)
Calcium, Ion: 1.13 mmol/L — ABNORMAL LOW (ref 1.15–1.40)
HCT: 26 % — ABNORMAL LOW (ref 36.0–46.0)
Hemoglobin: 8.8 g/dL — ABNORMAL LOW (ref 12.0–15.0)
O2 Saturation: 96 %
Patient temperature: 101.4
Potassium: 4.1 mmol/L (ref 3.5–5.1)
Sodium: 147 mmol/L — ABNORMAL HIGH (ref 135–145)
TCO2: 30 mmol/L (ref 22–32)
pCO2 arterial: 40.8 mmHg (ref 32.0–48.0)
pH, Arterial: 7.463 — ABNORMAL HIGH (ref 7.350–7.450)
pO2, Arterial: 82 mmHg — ABNORMAL LOW (ref 83.0–108.0)

## 2018-06-06 LAB — BASIC METABOLIC PANEL
Anion gap: 6 (ref 5–15)
BUN: 35 mg/dL — ABNORMAL HIGH (ref 8–23)
CO2: 26 mmol/L (ref 22–32)
Calcium: 7.1 mg/dL — ABNORMAL LOW (ref 8.9–10.3)
Chloride: 101 mmol/L (ref 98–111)
Creatinine, Ser: 0.99 mg/dL (ref 0.44–1.00)
GFR calc Af Amer: >60 mL/min (ref >60)
GFR calc non Af Amer: 59 mL/min — ABNORMAL LOW (ref >60)
Glucose, Bld: 501 mg/dL (ref 70–99)
Potassium: 3.8 mmol/L (ref 3.5–5.1)
Sodium: 133 mmol/L — ABNORMAL LOW (ref 135–145)

## 2018-06-06 LAB — COMPREHENSIVE METABOLIC PANEL
ALT: 17 U/L (ref 0–44)
AST: 31 U/L (ref 15–41)
Albumin: 2.1 g/dL — ABNORMAL LOW (ref 3.5–5.0)
Alkaline Phosphatase: 109 U/L (ref 38–126)
Anion gap: 7 (ref 5–15)
BUN: 35 mg/dL — ABNORMAL HIGH (ref 8–23)
CO2: 27 mmol/L (ref 22–32)
Calcium: 7.3 mg/dL — ABNORMAL LOW (ref 8.9–10.3)
Chloride: 101 mmol/L (ref 98–111)
Creatinine, Ser: 1 mg/dL (ref 0.44–1.00)
GFR calc Af Amer: >60 mL/min (ref >60)
GFR calc non Af Amer: 58 mL/min — ABNORMAL LOW (ref >60)
Glucose, Bld: 419 mg/dL — ABNORMAL HIGH (ref 70–99)
Potassium: 3.8 mmol/L (ref 3.5–5.1)
Sodium: 135 mmol/L (ref 135–145)
Total Bilirubin: 1.1 mg/dL (ref 0.3–1.2)
Total Protein: 6.3 g/dL — ABNORMAL LOW (ref 6.5–8.1)

## 2018-06-06 LAB — GLUCOSE, CAPILLARY
Glucose-Capillary: 128 mg/dL — ABNORMAL HIGH (ref 70–99)
Glucose-Capillary: 139 mg/dL — ABNORMAL HIGH (ref 70–99)

## 2018-06-06 LAB — MAGNESIUM: Magnesium: 2.4 mg/dL (ref 1.7–2.4)

## 2018-06-06 MED ORDER — VANCOMYCIN HCL 10 G IV SOLR
1500.0000 mg | INTRAVENOUS | Status: DC
  Administered 2018-06-07 – 2018-06-10 (×4): 1500 mg via INTRAVENOUS
  Filled 2018-06-06 (×4): qty 1500

## 2018-06-06 MED ORDER — LACTATED RINGERS IV SOLN
INTRAVENOUS | Status: DC
  Administered 2018-06-06 – 2018-06-11 (×4): via INTRAVENOUS

## 2018-06-06 MED ORDER — NOREPINEPHRINE 4 MG/250ML-% IV SOLN
0.0000 ug/min | INTRAVENOUS | Status: DC
  Administered 2018-06-06 – 2018-06-07 (×2): 5 ug/min via INTRAVENOUS
  Administered 2018-06-08: 2 ug/min via INTRAVENOUS
  Administered 2018-06-09: 22:00:00 8 ug/min via INTRAVENOUS
  Administered 2018-06-10: 07:00:00 6 ug/min via INTRAVENOUS
  Administered 2018-06-11: 4 ug/min via INTRAVENOUS
  Administered 2018-06-12: 23:00:00 5 ug/min via INTRAVENOUS
  Administered 2018-06-14: 03:00:00 2 ug/min via INTRAVENOUS
  Filled 2018-06-06 (×10): qty 250

## 2018-06-06 MED ORDER — VANCOMYCIN HCL 10 G IV SOLR
2000.0000 mg | Freq: Once | INTRAVENOUS | Status: AC
  Administered 2018-06-06: 15:00:00 2000 mg via INTRAVENOUS
  Filled 2018-06-06: qty 2000

## 2018-06-06 MED ORDER — NOREPINEPHRINE 4 MG/250ML-% IV SOLN
INTRAVENOUS | Status: AC
  Filled 2018-06-06: qty 250

## 2018-06-06 MED ORDER — FUROSEMIDE 10 MG/ML IJ SOLN
60.0000 mg | Freq: Once | INTRAMUSCULAR | Status: AC
  Administered 2018-06-06: 13:00:00 60 mg via INTRAVENOUS
  Filled 2018-06-06: qty 6

## 2018-06-06 NOTE — Progress Notes (Signed)
Spoke with Son Shannon Carroll and Shannon Carroll on the phone. Updates regarding Shannon Carroll were given and questions answers with compassion and care. Family thankful for our care here.

## 2018-06-06 NOTE — Progress Notes (Signed)
RT called to bedside by RN. Pt having copious pink frothy secretions through ett. Peep increased to 10, CXR ordered by MD and Lasix will be ordered. RN aware

## 2018-06-06 NOTE — Progress Notes (Signed)
Pharmacy Antibiotic Note  Shannon Carroll is a 68 y.o. female admitted on 06/08/2018 with COVID 19 infection. Patient was febrile overnight but remained afebrile since. Blood cultures are positive for MRSE. ID is recommending treatment with vancomycin and removal of PICC. Pharmacy has been consulted for vancomycin dosing.  Plan: Will load vancomycin 2000 mg iv once followed by:  Vancomycin 1500 mg IV Q 24 hrs. Goal AUC 400-550. Expected AUC: 480 SCr used: 1  Will follow renal function and repeat cultures.  Levels at steady state and as indicated.   Height: 5\' 8"  (172.7 cm) Weight: 275 lb 2.2 oz (124.8 kg) IBW/kg (Calculated) : 63.9  Temp (24hrs), Avg:99.8 F (37.7 C), Min:98.3 F (36.8 C), Max:101.7 F (38.7 C)  Recent Labs  Lab 05/31/18 0523 06/01/18 0406  06/02/18 0500 06/03/18 0435 06/04/18 0400 06/05/18 0500 06/05/18 1440 06/05/18 2350 06/06/18 0235  WBC  --  8.3  --  7.3 9.9 8.8 8.0  --   --  7.8  CREATININE  --  1.04*   < > 0.97 1.02* 1.04* 0.74 0.90 0.99 1.00  LATICACIDVEN 0.8 1.0  --   --   --   --   --   --   --   --    < > = values in this interval not displayed.    Estimated Creatinine Clearance: 76.1 mL/min (by C-G formula based on SCr of 1 mg/dL).    Allergies  Allergen Reactions  . Lisinopril Cough    4/4 Hydroxy>> (4/9)  ---zinc (4/9) 4/4 Azithro >> (4/8) 4/3 Vanc >>4/6, 4/18 >> 4/3 Cefepime >>4/6, restarted 4/8>>4/9 4/3 Flagyl x 1   4/5 mrsa pcr: neg 4/3 blood cx: neg 4/3 Covid: positive  4/7 Resp asp: G+ cocci and G-rods; normal flora  4/15 UCx: neg  4/15 BCx 2/4 staph epi (MRSE on BCID; 1/2 from each set)   4/18 BCx:    Thank you for allowing pharmacy to be a part of this patient's care.  Ulice Dash D 06/06/2018 2:16 PM

## 2018-06-06 NOTE — Progress Notes (Signed)
PROGRESS NOTE                                                                                                                                                                                                             Patient Demographics:    Shannon Carroll, is a 68 y.o. female, DOB - [birth year], ZNB:[id number]  Outpatient Primary MD for the patient is Harlan Stains, MD    LOS - 66  Chief Complaint  Patient presents with   Shortness of Breath       Brief Narrative  68 yr old female with PMHx HFpEF EF 60%, COPD, CVA, SVT, essential hypertension, obstructive sleep apnea, morbid obesity, who presented to the hospital with acute shortness of breath was diagnosed with COVID-19 infection, went into acute hypoxic respiratory failure intubated and kept in ICU by pulmonary critical care.  Transferred to [facility] covered ICU unit on 06/01/2018 under my care intubated.   Subjective:    Patient intubated and sedated   Assessment  & Plan :     Acute Hypoxic Resp. Failure due to Acute Covid 19 Viral Illness during the ongoing 2020 Covid 19 Pandemic - needed to be intubated, has finished treatment with hydroxychloroquine and azithromycin, continues to require endotracheal intubation with mechanical ventilation support, ARDS clinically seems to be improving, FiO2 requirements going down currently on 30%.  Management per pulmonology.  Septic shock.  Blood cultures negative to date presumed to be secondary to COVID-19 infection, initially required pressors through left arm PICC line.  Now sepsis pathophysiology seems to have resolved.  Fever/Bacteremia: Patient noted to have temperature of 101.4 F overnight.  Has been afebrile subsequently.  Reason for her fever is not entirely clear.  WBC is normal.  Ultrasound of the lower extremities did not show any DVT.  Chest x-ray has been repeated.  UA done 3 days ago did not show any  evidence for infection.  Blood cultures drawn on 4/15 are positive for staph epidermidis.  This appears to be positive in both the sets.  Could be the reason for patient's fever.  Discussed with Dr. Prince Rome with ID.  Recommends vancomycin.  Recommends repeat cultures 1 peripheral 1 through the PICC line.  No need to pull the PICC line out as of now.  ARF.  Resolved.  Iron deficiency anemia acute on chronic.  Some element of gradual blood loss due to repeated blood draws, have done type and screen, transfuse if hemoglobin drops below 7.  No signs of overt bleeding.  Continue PPI for prophylaxis.  Hypokalemia and hypomagnesemia. Replaced.  Magnesium is normal.  Morbid obesity with underlying obstructive sleep apnea.  Outpatient follow-up with PCP. Body mass index is 41.83 kg/m.  Nutrition. Tube feedings via G-tube.  Free water flushes ordered.  Hypertension.  Blood pressure has been fluctuating according to previous notes.  Stable over the last 24 hours.  Could have been medication induced.  Cortisol 13.6.    History of CVA.  Currently on Plavix, started on low-dose statin.  Dyslipidemia.  Home dose statin replaced.    SVT: Short burst of SVT during CPAP trial on 06/02/2018.  Resolved with beta-blockers.  Continue as needed. Recent TTE shows an EF of 65% no WMA.  Acute Metabolic encephalopathy: Underlying Dementia, 2 recent CVAs - continues to be encephalopathic despite sedation cessation.  CT head did not show any acute findings. Stable B12, folate, RPR.  TSH and cortisol were relatively stable.  Also discussed with son in detail on 06/03/2018.   DM2 - ISS  Nutrition.  Tube feedings via G-tube.  Free water flushes ordered.   Family Communication  : Pulmonology has discussed with family members today. Code Status :  Partial with No CPR Disposition Plan  :  ICU  Consults  :  PCCM  Procedures  :   Endotracheal intubation- 05/23/2018 L.Arm PICC - 05/30/18  Blood cx 06/11/2018- NGTD Novel  Coronavirus Positive result called 4/4. Tracheal aspirate 4/7>>> moderate WBC (abundant GPC and GNR)  MRSA 4/5> neg    DVT Prophylaxis  :  Lovenox    Lab Results  Component Value Date   PLT 280 06/06/2018    Inpatient Medications  Scheduled Meds:  atorvastatin  20 mg Per Tube q1800   budesonide (PULMICORT) nebulizer solution  0.25 mg Nebulization BID   chlorhexidine gluconate (MEDLINE KIT)  15 mL Mouth Rinse BID   Chlorhexidine Gluconate Cloth  6 each Topical Daily   clopidogrel  75 mg Per Tube Daily   docusate  50 mg Oral BID   enoxaparin (LOVENOX) injection  60 mg Subcutaneous Q24H   feeding supplement (JEVITY 1.2 CAL)  1,000 mL Per Tube Q24H   feeding supplement (PRO-STAT SUGAR FREE 64)  30 mL Per Tube TID WC   free water  200 mL Per Tube Q6H   furosemide  60 mg Intravenous Once   insulin aspart  0-15 Units Subcutaneous Q4H   mouth rinse  15 mL Mouth Rinse 10 times per day   metoprolol tartrate  12.5 mg Per Tube BID   pantoprazole sodium  40 mg Per Tube Daily   sennosides  5 mL Oral QHS   silver sulfADIAZINE  1 application Topical Daily   sodium chloride flush  10-40 mL Intracatheter Q12H   sodium chloride flush  3 mL Intravenous Q12H   venlafaxine  37.5 mg Per Tube QHS   venlafaxine  75 mg Per Tube q morning - 10a   Continuous Infusions:  dexmedetomidine (PRECEDEX) IV infusion Stopped (06/02/18 0800)   dextrose 75 mL/hr at 06/06/18 0700   PRN Meds:.acetaminophen, bisacodyl, fentaNYL (SUBLIMAZE) injection, ipratropium, levalbuterol, metoprolol tartrate, midazolam, [DISCONTINUED] ondansetron **OR** ondansetron (ZOFRAN) IV, Resource ThickenUp Clear  Antibiotics  :    Anti-infectives (From admission, onward)   Start     Dose/Rate Route Frequency Ordered Stop   05/27/18 2200  ceFEPIme (MAXIPIME) 2 g in sodium chloride 0.9 % 100 mL IVPB  Status:  Discontinued     2 g 200 mL/hr over 30 Minutes Intravenous Every 8 hours 05/27/18 1322 05/27/18 1400     05/27/18 1200  ceFEPIme (MAXIPIME) 2 g in sodium chloride 0.9 % 100 mL IVPB     2 g 200 mL/hr over 30 Minutes Intravenous  Once 05/27/18 1153 05/27/18 1330   05/26/18 2200  azithromycin (ZITHROMAX) tablet 500 mg  Status:  Discontinued     500 mg Oral Daily 05/26/18 0715 05/26/18 1502   05/26/18 2200  azithromycin (ZITHROMAX) tablet 500 mg     500 mg Per Tube Daily 05/26/18 1502 05/27/18 2141   05/24/18 2200  hydroxychloroquine (PLAQUENIL) tablet 200 mg     200 mg Oral 2 times daily 05/23/18 2113 05/28/18 1025   05/23/18 2300  azithromycin (ZITHROMAX) 500 mg in sodium chloride 0.9 % 250 mL IVPB  Status:  Discontinued     500 mg 250 mL/hr over 60 Minutes Intravenous Every 24 hours 05/23/18 2250 05/26/18 0715   05/23/18 2200  hydroxychloroquine (PLAQUENIL) tablet 400 mg     400 mg Oral 2 times daily 05/23/18 2113 05/24/18 1114   05/23/18 1500  vancomycin (VANCOCIN) 1,250 mg in sodium chloride 0.9 % 250 mL IVPB  Status:  Discontinued     1,250 mg 166.7 mL/hr over 90 Minutes Intravenous Every 24 hours 06/13/2018 1532 05/25/18 1141   05/23/18 0230  ceFEPIme (MAXIPIME) 2 g in sodium chloride 0.9 % 100 mL IVPB  Status:  Discontinued     2 g 200 mL/hr over 30 Minutes Intravenous Every 12 hours 05/31/2018 1532 05/25/18 1334   06/03/2018 1545  metroNIDAZOLE (FLAGYL) IVPB 500 mg     500 mg 100 mL/hr over 60 Minutes Intravenous  Once 06/10/2018 1531 05/27/2018 1731   06/05/2018 1530  vancomycin (VANCOCIN) IVPB 1000 mg/200 mL premix     1,000 mg 200 mL/hr over 60 Minutes Intravenous  Once 06/13/2018 1444 06/02/2018 1731   06/18/2018 1415  vancomycin (VANCOCIN) IVPB 1000 mg/200 mL premix     1,000 mg 200 mL/hr over 60 Minutes Intravenous  Once 05/31/2018 1401 06/18/2018 1538   05/31/2018 1415  ceFEPIme (MAXIPIME) 2 g in sodium chloride 0.9 % 100 mL IVPB     2 g 200 mL/hr over 30 Minutes Intravenous  Once 06/05/2018 1401 06/12/2018 1509         Objective:   Vitals:   06/06/18 1110 06/06/18 1112 06/06/18 1130 06/06/18  1230  BP:  112/68 124/72   Pulse:  82 82   Resp:  (!) 21 (!) 21   Temp: 98.3 F (36.8 C)  98.3 F (36.8 C)   TempSrc: Axillary  Oral   SpO2:  96% 95% 96%  Weight:      Height:        Wt Readings from Last 3 Encounters:  06/06/18 124.8 kg  05/19/18 131.9 kg  05/04/18 127 kg     Intake/Output Summary (Last 24 hours) at 06/06/2018 1315 Last data filed at 06/06/2018 1200 Gross per 24 hour  Intake 3978.13 ml  Output 1270 ml  Net 2708.13 ml     Physical Exam  General appearance: Intubated.  On mechanical ventilation.  Was noted to be agitated this morning with eyes open.  Was noted to be moving her extremities. Resp: Mildly tachypneic at rest.  Coarse breath sounds bilaterally. Cardio: S1-S2 is normal regular.  No S3-S4.  No rubs  murmurs or bruit GI: Abdomen is soft.  Nontender nondistended.  Bowel sounds are present normal.  No masses organomegaly Extremities: No edema.  Full range of motion of lower extremities. Neurologic: Remains encephalopathic.  Noted to move all her extremities.    Data Review:    CBC Recent Labs  Lab 06/02/18 0500  06/03/18 0435  06/04/18 0400  06/05/18 0406 06/05/18 0500 06/05/18 1440 06/06/18 0235 06/06/18 0440  WBC 7.3  --  9.9  --  8.8  --   --  8.0  --  7.8  --   HGB 7.4*   < > 8.1*  --  8.0*   < > 8.8* 8.3* 9.2* 7.6* 8.8*  HCT 26.9*   < > 29.3*   < > 28.6*   < > 26.0* 30.0* 27.0* 26.9* 26.0*  PLT 264  --  322  --  305  --   --  314  --  280  --   MCV 92.1  --  92.1  --  92.3  --   --  92.6  --  92.4  --   MCH 25.3*  --  25.5*  --  25.8*  --   --  25.6*  --  26.1  --   MCHC 27.5*  --  27.6*  --  28.0*  --   --  27.7*  --  28.3*  --   RDW 20.8*  --  21.6*  --  21.6*  --   --  21.6*  --  21.8*  --   LYMPHSABS 1.1  --  1.1  --  1.2  --   --  1.1  --  1.1  --   MONOABS 0.3  --  0.4  --  0.3  --   --  0.4  --  0.4  --   EOSABS 0.1  --  0.0  --  0.0  --   --  0.3  --  0.2  --   BASOSABS 0.0  --  0.1  --  0.1  --   --  0.1  --  0.1  --      < > = values in this interval not displayed.    Chemistries  Recent Labs  Lab 06/02/18 0500  06/03/18 0435 06/04/18 0400  06/05/18 0500 06/05/18 1440 06/05/18 2350 06/06/18 0235 06/06/18 0440  NA 145   < > 146* 146*   < > 148* 131* 133* 135 147*  K 4.3   < > 4.0 4.0   < > 2.6* 4.1 3.8 3.8 4.1  CL 107  --  108 108  --  124* 94* 101 101  --   CO2 31  --  30 31  --  21*  --  26 27  --   GLUCOSE 131*  --  119* 129*  --  89 656* 501* 419*  --   BUN 44*  --  49* 49*  --  37* 32* 35* 35*  --   CREATININE 0.97  --  1.02* 1.04*  --  0.74 0.90 0.99 1.00  --   CALCIUM 8.0*  --  7.9* 8.1*  --  5.4*  --  7.1* 7.3*  --   MG 2.5*  --   --   --   --  1.6*  --   --  2.4  --   AST 31  --  32 31  --  23  --   --  31  --  ALT 20  --  19 20  --  14  --   --  17  --   ALKPHOS 135*  --  150* 137*  --  82  --   --  109  --   BILITOT 1.3*  --  1.2 1.2  --  0.9  --   --  1.1  --    < > = values in this interval not displayed.    Recent Labs    06/04/18 0400  VITAMINB12 969*    Coagulation profile Recent Labs  Lab 06/04/18 1043  INR 1.4*    Recent Labs    06/04/18 0400  DDIMER 2.68*    Cardiac Enzymes Recent Labs  Lab 05/31/18 0521 06/01/18 0406  TROPONINI 0.03* <0.03   ------------------------------------------------------------------------------------------------------------------    Component Value Date/Time   BNP 2,117.1 (H) 06/03/2018 1402    Micro Results Recent Results (from the past 240 hour(s))  Culture, Urine     Status: None   Collection Time: 06/03/18  1:48 PM  Result Value Ref Range Status   Specimen Description   Final    URINE, CATHETERIZED Performed at [facility], [place] [address] [place], [place] [address]    Special Requests   Final    NONE Performed at [facility], [place] [address] [place], [place] [address]    Culture   Final    NO GROWTH Performed at [facility], [place] [address], [place],  [place] [address]    Report Status 06/04/2018 FINAL  Final  Culture, blood (routine x 2)     Status: Abnormal (Preliminary result)   Collection Time: 06/03/18  6:15 PM  Result Value Ref Range Status   Specimen Description BLOOD BLOOD RIGHT HAND  Final   Special Requests   Final    BOTTLES DRAWN AEROBIC AND ANAEROBIC Blood Culture adequate volume   Culture  Setup Time   Final    ANAEROBIC BOTTLE ONLY GRAM POSITIVE COCCI CRITICAL RESULT CALLED TO, READ BACK BY AND VERIFIED WITH: J.MIZE,RN AT 0022 ON 06/05/18 BY G.MCADOO    Culture STAPHYLOCOCCUS EPIDERMIDIS (A)  Final   Report Status PENDING  Incomplete   Organism ID, Bacteria STAPHYLOCOCCUS EPIDERMIDIS  Final      Susceptibility   Staphylococcus epidermidis - MIC*    CIPROFLOXACIN <=0.5 SENSITIVE Sensitive     ERYTHROMYCIN >=8 RESISTANT Resistant     GENTAMICIN <=0.5 SENSITIVE Sensitive     OXACILLIN >=4 RESISTANT Resistant     TETRACYCLINE >=16 RESISTANT Resistant     VANCOMYCIN 2 SENSITIVE Sensitive     TRIMETH/SULFA <=10 SENSITIVE Sensitive     CLINDAMYCIN <=0.25 SENSITIVE Sensitive     RIFAMPIN <=0.5 SENSITIVE Sensitive     Inducible Clindamycin Value in next row Sensitive      NEGATIVEPerformed at [place] [address] [place], [place] [address]    * STAPHYLOCOCCUS EPIDERMIDIS  Blood Culture ID Panel (Reflexed)     Status: Abnormal   Collection Time: 06/03/18  6:15 PM  Result Value Ref Range Status   Enterococcus species NOT DETECTED NOT DETECTED Final   Listeria monocytogenes NOT DETECTED NOT DETECTED Final   Staphylococcus species DETECTED (A) NOT DETECTED Final    Comment: Methicillin (oxacillin) resistant coagulase negative staphylococcus. Possible blood culture contaminant (unless isolated from more than one blood culture draw or clinical case suggests pathogenicity). No antibiotic treatment is indicated for blood  culture contaminants. CRITICAL RESULT CALLED TO, READ  BACK BY AND VERIFIED WITH: J.MIZE,RN AT 0022  ON 06/05/18 BY G.MCADOO    Staphylococcus aureus (BCID) NOT DETECTED NOT DETECTED Final   Methicillin resistance DETECTED (A) NOT DETECTED Final    Comment: CRITICAL RESULT CALLED TO, READ BACK BY AND VERIFIED WITH: J.MIZE,RN AT 0022 ON 06/05/18 BY G.MCADOO    Streptococcus species NOT DETECTED NOT DETECTED Final   Streptococcus agalactiae NOT DETECTED NOT DETECTED Final   Streptococcus pneumoniae NOT DETECTED NOT DETECTED Final   Streptococcus pyogenes NOT DETECTED NOT DETECTED Final   Acinetobacter baumannii NOT DETECTED NOT DETECTED Final   Enterobacteriaceae species NOT DETECTED NOT DETECTED Final   Enterobacter cloacae complex NOT DETECTED NOT DETECTED Final   Escherichia coli NOT DETECTED NOT DETECTED Final   Klebsiella oxytoca NOT DETECTED NOT DETECTED Final   Klebsiella pneumoniae NOT DETECTED NOT DETECTED Final   Proteus species NOT DETECTED NOT DETECTED Final   Serratia marcescens NOT DETECTED NOT DETECTED Final   Haemophilus influenzae NOT DETECTED NOT DETECTED Final   Neisseria meningitidis NOT DETECTED NOT DETECTED Final   Pseudomonas aeruginosa NOT DETECTED NOT DETECTED Final   Candida albicans NOT DETECTED NOT DETECTED Final   Candida glabrata NOT DETECTED NOT DETECTED Final   Candida krusei NOT DETECTED NOT DETECTED Final   Candida parapsilosis NOT DETECTED NOT DETECTED Final   Candida tropicalis NOT DETECTED NOT DETECTED Final    Comment: Performed at [facility], [place] [address], [place], [place] [address]  Culture, blood (routine x 2)     Status: Abnormal (Preliminary result)   Collection Time: 06/03/18  6:30 PM  Result Value Ref Range Status   Specimen Description   Final    BLOOD BLOOD RIGHT WRIST Performed at [place] [address], [place], [place] [address]    Special Requests   Final    BOTTLES DRAWN AEROBIC ONLY Blood Culture adequate volume Performed at [place] [address] [place], [place]  [address]    Culture  Setup Time   Final    AEROBIC BOTTLE ONLY GRAM POSITIVE COCCI CRITICAL VALUE NOTED.  VALUE IS CONSISTENT WITH PREVIOUSLY REPORTED AND CALLED VALUE.    Culture (A)  Final    STAPHYLOCOCCUS EPIDERMIDIS SUSCEPTIBILITIES PERFORMED ON PREVIOUS CULTURE WITHIN THE LAST 5 DAYS. Performed at [facility], [place] [address], [place], [place] [address]    Report Status PENDING  Incomplete    Radiology Reports  Ct Head Wo Contrast  Result Date: 06/05/2018 CLINICAL DATA:  68 y/o  F; COVID positive, encephalopathy. EXAM: CT HEAD WITHOUT CONTRAST TECHNIQUE: Contiguous axial images were obtained from the base of the skull through the vertex without intravenous contrast. COMPARISON:  03/31/2018 MRI of the head. FINDINGS: Brain: Motion degraded study. No evidence of acute infarction, hemorrhage, hydrocephalus, extra-axial collection or mass lesion/mass effect. Stable chronic infarct in the right frontal lobe. Stable nonspecific white matter hypodensities compatible with chronic microvascular ischemic changes and advanced volume loss of the brain. Dystrophic calcifications in the basal ganglia and scattered in the right cerebral cortex. Vascular: Calcific atherosclerosis of carotid siphons. No hyperdense vessel identified. Skull: Normal. Negative for fracture or focal lesion. Sinuses/Orbits: No acute finding. Other: Bilateral intra-ocular lens replacement. IMPRESSION: 1. Motion degraded study. No acute intracranial abnormality identified. 2. Stable chronic right frontal lobe infarct, chronic microvascular ischemic changes, and advanced volume loss of the brain. Electronically Signed   By: Kristine Garbe M.D.   On: 06/05/2018 22:08   Ct Chest  Wo Contrast  Result Date: 06/09/2018 CLINICAL DATA:  68 year old female with shortness of breath, fever. Patient also has a history of congestive heart failure. EXAM: CT CHEST WITHOUT CONTRAST TECHNIQUE: Multidetector CT imaging of the chest was  performed following the standard protocol without IV contrast. COMPARISON:  Chest x-ray obtained earlier today; prior chest x-ray 05/19/18 FINDINGS: Cardiovascular: Limited evaluation in the absence of intravenous contrast. Cardiomegaly. Calcification of the mitral valve annulus. Coronary artery calcifications also present. Atherosclerotic calcifications throughout the aorta. No pericardial effusion. Mediastinum/Nodes: Thyroid gland is unremarkable. Borderline enlarged mediastinal lymph nodes. Index right paratracheal lymph node measures 1.2 cm in short axis. Index low right paratracheal lymph node measures 1.2 cm in short axis. Unremarkable thoracic esophagus. Lungs/Pleura: Extensive patchy ground-glass and more consolidative airspace opacities in a peripheral and peribronchovascular distribution throughout all lobes of both lungs but preferentially within the upper lobes. There is relative sparing of the middle lobe. Somewhat less significant disease within the dependent portions of the right lower lobe makes aspiration less likely. Additionally, there is mild bilateral lower lobe atelectasis secondary to the presence of small bilateral pleural effusions. Upper Abdomen: Small volume perihepatic ascites. Relative right renal atrophy. Musculoskeletal: No acute fracture or aggressive appearing lytic or blastic osseous lesion. IMPRESSION: 1. There are a spectrum of findings in the lungs which can be seen with acute atypical infection (as well as other non-infectious etiologies). In particular, viral pneumonia (including COVID-19) should be considered in the appropriate clinical setting. Given the upper lung predominant pattern of distribution, aspiration is considered significantly less likely despite the patient's underlying risk factors. Similarly, congestive heart failure is considered less likely given the absence of interlobular septal thickening. 2. Small to moderate bilateral layering pleural effusions. 3. Small  volume perihepatic site ease of uncertain etiology. 4. Borderline enlarged mediastinal lymph nodes are favored to be reactive. 5. Coronary artery calcifications. 6.  Aortic Atherosclerosis (ICD10-170.0). 7. Relative right renal atrophy. These results were called by telephone at the time of interpretation on 05/24/2018 at 4:42 pm to Dr. Evangeline Gula, who verbally acknowledged these results. Electronically Signed   By: Jacqulynn Cadet M.D.   On: 06/02/2018 16:43   Dg Chest Port 1 View  Result Date: 06/04/2018 CLINICAL DATA:  Shortness of breath. EXAM: PORTABLE CHEST 1 VIEW COMPARISON:  06/02/2018 FINDINGS: Endotracheal tube has tip 3.6 cm above the carina. Enteric tube courses into the region of the stomach and off the film as tip not visualized. Left-sided PICC line unchanged. Lungs are adequately inflated demonstrate hazy bilateral perihilar opacification with slight interval worsening over the right upper perihilar region. Findings may be due to interstitial edema versus infection. Stable bibasilar opacification likely small effusions with atelectasis. Stable cardiomegaly. Remainder the exam is unchanged. IMPRESSION: Hazy bilateral perihilar opacification with interval worsening over the right upper perihilar region. Findings may be due to interstitial edema versus infection. Stable bibasilar opacification likely small effusions with atelectasis. Stable cardiomegaly. Tubes and lines as described. Electronically Signed   By: Marin Olp M.D.   On: 06/04/2018 09:04   Dg Chest Port 1 View  Result Date: 06/02/2018 CLINICAL DATA:  Short of breath EXAM: PORTABLE CHEST 1 VIEW COMPARISON:  05/30/2018 FINDINGS: Endotracheal and NG tubes are stable. Left PICC placed. Tip is at the cavoatrial junction. Diffuse airspace disease and bilateral pleural effusions are stable. Cardiac silhouette is prominent likely due to AP technique. No pneumothorax. IMPRESSION: Stable diffuse bilateral airspace disease and bilateral pleural  effusions. Left PICC placed.  Tip is at  the cavoatrial junction. Electronically Signed   By: Marybelle Killings M.D.   On: 06/02/2018 07:40   Dg Chest Port 1 View  Result Date: 05/30/2018 CLINICAL DATA:  ARDS EXAM: PORTABLE CHEST 1 VIEW COMPARISON:  05/28/2018 FINDINGS: Endotracheal tube in good position.  NG tube in the stomach. Cardiac enlargement. Extensive diffuse bilateral airspace disease unchanged. Bilateral pleural effusions and bibasilar atelectasis unchanged. IMPRESSION: Diffuse bilateral airspace disease and bilateral effusions unchanged. Electronically Signed   By: Franchot Gallo M.D.   On: 05/30/2018 08:14   Dg Chest Port 1 View  Result Date: 05/28/2018 CLINICAL DATA:  68 year old female with ARDS EXAM: PORTABLE CHEST 1 VIEW COMPARISON:  Multiple prior most recent 05/27/2018, 05/23/2018, CT 06/02/2018 FINDINGS: Cardiomediastinal silhouette unchanged in size and contour, with cardiomegaly. Endotracheal tube unchanged terminating 2.7 cm above the carina. Unchanged gastric tube terminating out of the field of view. Low lung volumes with veil opacities at the bilateral lung bases obscuring the hemidiaphragm. Patchy airspace and interstitial opacities bilaterally, similar to the comparison. No pneumothorax. IMPRESSION: Unchanged endotracheal tube and gastric tube. Similar appearance of mixed interstitial and airspace opacities of the bilateral lungs, with bibasilar pleural effusions. Electronically Signed   By: Corrie Mckusick D.O.   On: 05/28/2018 07:53   Dg Chest Port 1 View  Result Date: 05/27/2018 CLINICAL DATA:  68 year old female with respiratory failure EXAM: PORTABLE CHEST 1 VIEW COMPARISON:  05/23/2018, 05/30/2018, 05/19/2018 FINDINGS: Cardiomediastinal silhouette unchanged in size and contour. Endotracheal tube terminates 2.7 cm above the carina. Unchanged gastric tube, with the tip terminating just below the diaphragm. No pneumothorax. Worsening airspace opacity in the left lung and right mid  lung with veil opacity at the lung bases obscuring the hemidiaphragms and heart borders. Interval removal of the defibrillator pads. IMPRESSION: Unchanged endotracheal tube and gastric tube. Persisting interstitial and airspace opacity of the lungs, worsening on the left. Small bilateral pleural effusions and associated atelectasis/consolidation. Electronically Signed   By: Corrie Mckusick D.O.   On: 05/27/2018 08:02   Dg Chest Port 1 View  Result Date: 05/23/2018 CLINICAL DATA:  Intubation EXAM: PORTABLE CHEST 1 VIEW COMPARISON:  06/02/2018 FINDINGS: Endotracheal tube tip is 3 cm above the carina. Orogastric or nasogastric tube enters the abdomen. The tip may be just beyond the gastroesophageal junction. Widespread bilateral pulmonary infiltrates persist. No worsening or new finding by radiography. IMPRESSION: Endotracheal tube 3 cm above the carina. Orogastric or nasogastric tube enters the abdomen, just beyond the gastroesophageal junction. Widespread bilateral infiltrates persist. Electronically Signed   By: Nelson Chimes M.D.   On: 05/23/2018 21:38   Dg Chest Port 1 View  Result Date: 06/12/2018 CLINICAL DATA:  Shortness of breath since yesterday. Tachycardia. Former smoker. EXAM: PORTABLE CHEST 1 VIEW COMPARISON:  05/19/2018 FINDINGS: Lungs are adequately inflated and demonstrate worsening of bilateral multifocal airspace opacification which is most prominent over the right upper lobe and left perihilar region. Possible small amount left pleural fluid unchanged. Mild stable cardiomegaly. Remainder the exam is unchanged. IMPRESSION: Interval worsening bilateral multifocal airspace process most prominent over the right upper lobe and left perihilar region likely multifocal pneumonia. Small amount left pleural fluid unchanged. Mild stable cardiomegaly. Electronically Signed   By: Marin Olp M.D.   On: 05/30/2018 14:29   Dg Chest Port 1 View  Result Date: 05/19/2018 CLINICAL DATA:  Respiratory failure  and congestive heart failure. EXAM: PORTABLE CHEST 1 VIEW COMPARISON:  05/18/2018 FINDINGS: Stable cardiac enlargement. Slight diminishment in CHF since the prior study. There  is some asymmetric vascular congestion versus atelectasis in the right upper lung. Stable left basilar atelectasis and probable component of small to moderate left pleural effusion. IMPRESSION: Decreased prominence of CHF since the prior chest x-ray. Asymmetric vascular congestion versus atelectasis in the right upper lung. Left basilar atelectasis with probable component of left pleural fluid. Electronically Signed   By: Aletta Edouard M.D.   On: 05/19/2018 08:27   Dg Chest Port 1 View  Result Date: 05/18/2018 CLINICAL DATA:  Short of breath EXAM: PORTABLE CHEST 1 VIEW COMPARISON:  05/17/2018 FINDINGS: Stable enlarged cardiac silhouette. Bilateral pleural effusions similar prior. A central venous congestion. No focal consolidation. No pneumothorax. IMPRESSION: Cardiomegaly with small bilateral pleural effusions. Concern for mild congestive heart failure Electronically Signed   By: Suzy Bouchard M.D.   On: 05/18/2018 08:16   Dg Chest Port 1 View  Result Date: 05/17/2018 CLINICAL DATA:  Shortness of breath EXAM: PORTABLE CHEST 1 VIEW COMPARISON:  05/16/2018 FINDINGS: Cardiomegaly with mild perihilar edema and small bilateral pleural effusions, grossly unchanged. No pneumothorax. IMPRESSION: Cardiomegaly mild interstitial edema and small bilateral pleural effusions, grossly unchanged. Electronically Signed   By: Julian Hy M.D.   On: 05/17/2018 08:11   Dg Chest Port 1 View  Result Date: 05/16/2018 CLINICAL DATA:  Shortness of breath EXAM: PORTABLE CHEST 1 VIEW COMPARISON:  05/15/2018 FINDINGS: Cardiomegaly with mild perihilar edema. Small to moderate bilateral pleural effusions, increased. No pneumothorax. IMPRESSION: Cardiomegaly with mild perihilar edema and small to moderate bilateral pleural effusions, increased.  Electronically Signed   By: Julian Hy M.D.   On: 05/16/2018 07:41   Dg Chest Port 1 View  Result Date: 05/15/2018 CLINICAL DATA:  Shortness of breath EXAM: PORTABLE CHEST 1 VIEW COMPARISON:  May 14, 2018 FINDINGS: There is persistent cardiomegaly. Pulmonary vascularity is within normal limits. Currently there is no appreciable edema or consolidation. No adenopathy. No bone lesions. There is aortic atherosclerosis. IMPRESSION: Cardiomegaly. No edema or consolidation. Pulmonary vascularity within normal limits. Aortic Atherosclerosis (ICD10-I70.0). Electronically Signed   By: Lowella Grip III M.D.   On: 05/15/2018 07:35   Dg Chest Port 1 View  Result Date: 05/14/2018 CLINICAL DATA:  Aspiration EXAM: PORTABLE CHEST 1 VIEW COMPARISON:  May 14, 2018 study obtained earlier in the day FINDINGS: There is cardiomegaly with mild pulmonary venous hypertension. There are pleural effusions bilaterally. There is no appreciable edema or consolidation. There is aortic atherosclerosis. No adenopathy. No bone lesions. IMPRESSION: Pulmonary vascular congestion with small pleural effusions bilaterally. No frank edema or consolidation. Aortic Atherosclerosis (ICD10-I70.0). Electronically Signed   By: Lowella Grip III M.D.   On: 05/14/2018 16:00   Dg Chest Port 1 View  Result Date: 05/14/2018 CLINICAL DATA:  Short of breath EXAM: PORTABLE CHEST 1 VIEW COMPARISON:  05/06/2018 FINDINGS: Cardiac enlargement with mild progression of vascular congestion. Progression of bibasilar atelectasis and small effusions. Atherosclerotic ascending aorta and aortic arch. IMPRESSION: Congestive heart failure with mild progression. Electronically Signed   By: Franchot Gallo M.D.   On: 05/14/2018 08:31   Vas [place] Lower Extremity Venous (dvt)  Result Date: 06/06/2018  Lower Venous Study Indications: Swelling, and Elevated d-Dimer and fever. Other Indications: Positive Covid-19. Limitations: Body habitus and poor  ultrasound/tissue interface. Performing Technologist: Oda Cogan RDMS, RVT  Examination Guidelines: A complete evaluation includes B-mode imaging, spectral Doppler, color Doppler, and power Doppler as needed of all accessible portions of each vessel. Bilateral testing is considered an integral part of a complete examination. Limited examinations  for reoccurring indications may be performed as noted.  +---------+---------------+---------+-----------+----------+--------------+  RIGHT     Compressibility Phasicity Spontaneity Properties Summary         +---------+---------------+---------+-----------+----------+--------------+  CFV       Full            Yes       Yes                                    +---------+---------------+---------+-----------+----------+--------------+  SFJ       Full                                                             +---------+---------------+---------+-----------+----------+--------------+  FV Prox   Full                                                             +---------+---------------+---------+-----------+----------+--------------+  FV Mid    Full                                                             +---------+---------------+---------+-----------+----------+--------------+  FV Distal Full                                                             +---------+---------------+---------+-----------+----------+--------------+  PFV       Full                                                             +---------+---------------+---------+-----------+----------+--------------+  POP       Full                                                             +---------+---------------+---------+-----------+----------+--------------+  PTV                                                        Not visualized  +---------+---------------+---------+-----------+----------+--------------+  PERO  Not visualized   +---------+---------------+---------+-----------+----------+--------------+ Calf veins were poorly visualized cannot exclude DVT in this area.  +---------+---------------+---------+-----------+----------+--------------+  LEFT      Compressibility Phasicity Spontaneity Properties Summary         +---------+---------------+---------+-----------+----------+--------------+  CFV       Full            Yes       Yes                                    +---------+---------------+---------+-----------+----------+--------------+  SFJ       Full                                                             +---------+---------------+---------+-----------+----------+--------------+  FV Prox   Full                                                             +---------+---------------+---------+-----------+----------+--------------+  FV Mid    Full                                                             +---------+---------------+---------+-----------+----------+--------------+  FV Distal Full                                                             +---------+---------------+---------+-----------+----------+--------------+  PFV       Full                                                             +---------+---------------+---------+-----------+----------+--------------+  POP       Full                                                             +---------+---------------+---------+-----------+----------+--------------+  PTV                                                        Not visualized  +---------+---------------+---------+-----------+----------+--------------+  PERO  Not visualized  +---------+---------------+---------+-----------+----------+--------------+ Calf veins were poorly visualized cannot exclude DVT in this area.  Left Technical Findings: Test performed at [facility].   Summary: Right: There is no evidence of deep vein thrombosis in the lower  extremity. However, portions of this examination were limited- see technologist comments above. Left: There is no evidence of deep vein thrombosis in the lower extremity. However, portions of this examination were limited- see technologist comments above.  *See table(s) above for measurements and observations. Electronically signed by Servando Snare MD on 06/06/2018 at 10:26:42 AM.    Final    [place] Ekg Site Rite  Result Date: 05/30/2018 If Site Rite image not attached, placement could not be confirmed due to current cardiac rhythm.  [place] Ekg Site Rite  Result Date: 05/30/2018 If Site Rite image not attached, placement could not be confirmed due to current cardiac rhythm.  US Abdomen Limited Ruq  Result Date: 05/14/2018 CLINICAL DATA:  Elevated LFTs. EXAM: ULTRASOUND ABDOMEN LIMITED RIGHT UPPER QUADRANT COMPARISON:  None. FINDINGS: Gallbladder: Physiologically distended. No gallstones. No significant gallbladder wall thickening. No sonographic Murphy sign noted by sonographer. Common bile duct: Diameter: 5 mm. Liver: No focal lesion identified. Diffusely heterogeneous and slightly increased in parenchymal echogenicity. Portal vein is patent on color Doppler imaging with normal direction of blood flow towards the liver. Small amount of right upper quadrant ascites. Right pleural effusion noted. IMPRESSION: 1. Mild hepatic steatosis. 2. No gallstones.  No biliary dilatation. 3. Small volume of ascites.  Right pleural effusion. Electronically Signed   By: Keith Rake M.D.   On: 05/14/2018 22:18   Bonnielee Haff M.D on 06/06/2018 at 1:15 PM  To page go to [email or web address] - password [facility]  [facility] -  Office  [phone]

## 2018-06-06 NOTE — Plan of Care (Signed)
  Interdisciplinary Goals of Care Family Meeting   Date carried out:: 06/06/2018  Location of the meeting: Bedside  Member's involved: Physician and Family Member or next of kin  Durable Power of Attorney or acting medical decision maker: Son, husband.   Discussion: We discussed goals of care for Shannon Carroll .  We talked about the fact that Shannon Carroll has made little progress this week and has not been waking up consistently.  We also talked about the fact that she has not been able to wean off of the ventilator.  While at this time I cannot say that I think she has an incurable illness given her multiple comorbid illnesses and the severity of this acute illness I think the likelihood of survival is low.  They would prefer to continue supportive care for another week but they do not want a tracheostomy and they do not want to consider long-term aggressive care in an LTAC type facility.  Code status: Limited Code or DNR with short term  Disposition: Continue current acute care  Time spent for the meeting: 19 minutes  Simonne Maffucci 06/06/2018, 1:05 PM

## 2018-06-06 NOTE — Progress Notes (Signed)
NAME:  Shannon Carroll, MRN:  [id number], DOB:  10-21-1950, LOS: 29 ADMISSION DATE:  06/06/2018, CONSULTATION DATE:  05/23/2018 REFERRING MD:  Glenna Durand, CHIEF COMPLAINT:  SVT hpoxemia, COVID 53 POS   Brief History   68 yr old female with PMHx HFpEF, COPD, w/ bilateral infiltrates COVID positive on supplemental oxygen 8L with increased work of breathing, fatigue, and diaphoretic. PCCM consulted for acute respiratory distress.  Significant Hospital Events   4/3 + COVID -19  4/4 ICU admit intubated  4/12 weaning trial, failed, de-sat, SVT  4/15 episodes of SVT  Consults:  PCCM   Procedures:  Endotracheal intubation- 05/23/2018  Significant Diagnostic Tests:  Bilateral alveolar infiltrates on CXR -CXR 4/8 with worsening LLL and RML opacity  4/18 CT head > stable R frontal infarct, NAICP  Micro Data:  Blood cx 05/30/2018- NGTD Novel Coronavirus Positive result called 4/4. Tracheal aspirate 4/7>>> moderate WBC (abundant GPC and GNR)  MRSA 4/5> neg   Antimicrobials:  Vancomycin- 05/23/2018 Cefepime- 05/23/2018-4/6, 4/8>>  Hydroxychloroquine- upon transfer to ICU -COVID (to end 4/9) Axithromycin - upon transfer to ICU- COVID (to end 4/8)  Interim history/subjective:  Some frothy secretions, failed SBT after 1 hour today  Objective   Blood pressure 114/83, pulse 91, temperature (!) 100.7 F (38.2 C), temperature source Axillary, resp. rate (!) 23, height 5\' 8"  (1.727 m), weight 124.8 kg, SpO2 97 %.    Vent Mode: PRVC FiO2 (%):  [30 %] 30 % Set Rate:  [12 bmp-16 bmp] 12 bmp Vt Set:  [510 mL] 510 mL PEEP:  [5 cmH20] 5 cmH20 Plateau Pressure:  [18 cmH20-23 cmH20] 18 cmH20   Intake/Output Summary (Last 24 hours) at 06/06/2018 0910 Last data filed at 06/06/2018 0700 Gross per 24 hour  Intake 3759.98 ml  Output 1095 ml  Net 2664.98 ml   Filed Weights   06/04/18 0500 06/05/18 0410 06/06/18 0342  Weight: 126.3 kg 124.6 kg 124.8 kg   Examination:   General:  In bed on vent HENT: NCAT  ETT in place PULM: CTA B, vent supported breathing CV: RRR, no mgr GI: BS+, soft, nontender MSK: normal bulk and tone Neuro: sleepy on vent, not following comamnds, opens eyes    Resolved Hospital Problem list   Transaminits.  Septic shock  Assessment & Plan:   Acute Hypoxic Respiratory Failure on mechanical ventilation  -ARDS secondary to  COVID-19 viral pneumonia Likely acute pulmonary edema Plan: Continue diuresis, will write for furosemide now Chest x-ray now Continue pressure support trials as tolerated during the daytime Continue ventilator associated prevention protocol Consider tracheostomy, I will discuss with family today.  Baseline HFpEF Lasix today as above Check chest x-ray  COPD: Continue Xopenex, Atrovent, and Pulmicort nebulized through ventilator circuit  Acute toxic metabolic delirium History of stroke, right frontal Minimize sedation Frequent orientation  Hypokalemia: Monitor BMET and UOP Replace electrolytes as needed   Best practice:  Diet:tube feed Pain/Anxiety/Delirium protocol (if indicated): Continuous infusion VAP protocol (if indicated): NA DVT prophylaxis: SCD, Lovenox GI prophylaxis: Protonix Glucose control: sliding scale Mobility: bed bound Code Status: Partial code - REMAINS NO CPR, but if [place] OK IF WE PUT BACK ETT Family Communication: I spoke with patients son Disposition: ICU   Labs   CBC: Recent Labs  Lab 06/02/18 0500  06/03/18 0435  06/04/18 0400  06/05/18 0406 06/05/18 0500 06/05/18 1440 06/06/18 0235 06/06/18 0440  WBC 7.3  --  9.9  --  8.8  --   --  8.0  --  7.8  --   NEUTROABS 5.8  --  8.3*  --  7.1  --   --  6.2  --  6.0  --   HGB 7.4*   < > 8.1*  --  8.0*   < > 8.8* 8.3* 9.2* 7.6* 8.8*  HCT 26.9*   < > 29.3*   < > 28.6*   < > 26.0* 30.0* 27.0* 26.9* 26.0*  MCV 92.1  --  92.1  --  92.3  --   --  92.6  --  92.4  --   PLT 264  --  322  --  305  --   --  314  --  280  --    < > =  values in this interval not displayed.    Basic Metabolic Panel: Recent Labs  Lab 06/02/18 0500  06/03/18 0435 06/04/18 0400  06/05/18 0500 06/05/18 1440 06/05/18 2350 06/06/18 0235 06/06/18 0440  NA 145   < > 146* 146*   < > 148* 131* 133* 135 147*  K 4.3   < > 4.0 4.0   < > 2.6* 4.1 3.8 3.8 4.1  CL 107  --  108 108  --  124* 94* 101 101  --   CO2 31  --  30 31  --  21*  --  26 27  --   GLUCOSE 131*  --  119* 129*  --  89 656* 501* 419*  --   BUN 44*  --  49* 49*  --  37* 32* 35* 35*  --   CREATININE 0.97  --  1.02* 1.04*  --  0.74 0.90 0.99 1.00  --   CALCIUM 8.0*  --  7.9* 8.1*  --  5.4*  --  7.1* 7.3*  --   MG 2.5*  --   --   --   --  1.6*  --   --  2.4  --    < > = values in this interval not displayed.   GFR: Estimated Creatinine Clearance: 76.1 mL/min (by C-G formula based on SCr of 1 mg/dL). Recent Labs  Lab 05/31/18 0523 06/01/18 0406  06/03/18 0435 06/04/18 0400 06/05/18 0500 06/06/18 0235  WBC  --  8.3   < > 9.9 8.8 8.0 7.8  LATICACIDVEN 0.8 1.0  --   --   --   --   --    < > = values in this interval not displayed.    Liver Function Tests: Recent Labs  Lab 06/02/18 0500 06/03/18 0435 06/04/18 0400 06/05/18 0500 06/06/18 0235  AST 31 32 31 23 31   ALT 20 19 20 14 17   ALKPHOS 135* 150* 137* 82 109  BILITOT 1.3* 1.2 1.2 0.9 1.1  PROT 6.4* 6.8 6.8 4.7* 6.3*  ALBUMIN 2.3* 2.4* 2.4* 1.7* 2.1*   No results for input(s): LIPASE, AMYLASE in the last 168 hours. No results for input(s): AMMONIA in the last 168 hours.  ABG    Component Value Date/Time   PHART 7.463 (H) 06/06/2018 0440   PCO2ART 40.8 06/06/2018 0440   PO2ART 82.0 (L) 06/06/2018 0440   HCO3 28.9 (H) 06/06/2018 0440   TCO2 30 06/06/2018 0440   O2SAT 96.0 06/06/2018 0440     Coagulation Profile: Recent Labs  Lab 06/04/18 1043  INR 1.4*    Cardiac Enzymes: Recent Labs  Lab 05/31/18 0521 06/01/18 0406  TROPONINI 0.03* <0.03    HbA1C: Hgb A1c MFr  Bld  Date/Time Value Ref  Range Status  03/02/2018 04:43 PM 5.2 4.8 - 5.6 % Final    Comment:             Prediabetes: 5.7 - 6.4          Diabetes: >6.4          Glycemic control for adults with diabetes: <7.0     CBG: Recent Labs  Lab 06/05/18 1330 06/05/18 1556 06/05/18 1954 06/05/18 2304 06/06/18 0301  GLUCAP 154* 153* 134* 111* 139*   My cc time 35 minutes  Roselie Awkward, MD [place] [facility] Pager: [phone] Cell: [phone] If no response, call [phone]

## 2018-06-07 LAB — BASIC METABOLIC PANEL
Anion gap: 9 (ref 5–15)
BUN: 34 mg/dL — ABNORMAL HIGH (ref 8–23)
CO2: 28 mmol/L (ref 22–32)
Calcium: 7.6 mg/dL — ABNORMAL LOW (ref 8.9–10.3)
Chloride: 104 mmol/L (ref 98–111)
Creatinine, Ser: 1.08 mg/dL — ABNORMAL HIGH (ref 0.44–1.00)
GFR calc Af Amer: >60 mL/min (ref >60)
GFR calc non Af Amer: 53 mL/min — ABNORMAL LOW (ref >60)
Glucose, Bld: 121 mg/dL — ABNORMAL HIGH (ref 70–99)
Potassium: 3.6 mmol/L (ref 3.5–5.1)
Sodium: 141 mmol/L (ref 135–145)

## 2018-06-07 LAB — CBC
HCT: 29.7 % — ABNORMAL LOW (ref 36.0–46.0)
Hemoglobin: 8.5 g/dL — ABNORMAL LOW (ref 12.0–15.0)
MCH: 26.2 pg (ref 26.0–34.0)
MCHC: 28.6 g/dL — ABNORMAL LOW (ref 30.0–36.0)
MCV: 91.7 fL (ref 80.0–100.0)
Platelets: 324 10*3/uL (ref 150–400)
RBC: 3.24 MIL/uL — ABNORMAL LOW (ref 3.87–5.11)
RDW: 21.6 % — ABNORMAL HIGH (ref 11.5–15.5)
WBC: 8.8 10*3/uL (ref 4.0–10.5)
nRBC: 0 % (ref 0.0–0.2)

## 2018-06-07 LAB — GLUCOSE, CAPILLARY
Glucose-Capillary: 106 mg/dL — ABNORMAL HIGH (ref 70–99)
Glucose-Capillary: 107 mg/dL — ABNORMAL HIGH (ref 70–99)
Glucose-Capillary: 125 mg/dL — ABNORMAL HIGH (ref 70–99)
Glucose-Capillary: 129 mg/dL — ABNORMAL HIGH (ref 70–99)
Glucose-Capillary: 135 mg/dL — ABNORMAL HIGH (ref 70–99)
Glucose-Capillary: 137 mg/dL — ABNORMAL HIGH (ref 70–99)

## 2018-06-07 LAB — CULTURE, BLOOD (ROUTINE X 2): Special Requests: ADEQUATE

## 2018-06-07 LAB — MAGNESIUM: Magnesium: 2.5 mg/dL — ABNORMAL HIGH (ref 1.7–2.4)

## 2018-06-07 MED ORDER — POTASSIUM CHLORIDE 20 MEQ/15ML (10%) PO SOLN
40.0000 meq | Freq: Once | ORAL | Status: AC
  Administered 2018-06-07: 14:00:00 40 meq
  Filled 2018-06-07: qty 30

## 2018-06-07 NOTE — Progress Notes (Signed)
PROGRESS NOTE                                                                                                                                                                                                             Patient Demographics:    Shannon Carroll, is a 68 y.o. female, DOB - [birth year], MMI:[id number]  Outpatient Primary MD for the patient is Harlan Stains, MD    LOS - 81  Chief Complaint  Patient presents with   Shortness of Breath       Brief Narrative  68 yr old female with PMHx HFpEF EF 60%, COPD, CVA, SVT, essential hypertension, obstructive sleep apnea, morbid obesity, who presented to the hospital with acute shortness of breath was diagnosed with COVID-19 infection, went into acute hypoxic respiratory failure intubated and kept in ICU by pulmonary critical care.  Transferred to [facility] covered ICU unit on 06/01/2018 under my care intubated.   Subjective:   Patient intubated and sedated.   Assessment  & Plan :     Acute Hypoxic Resp Failure due to Acute Covid 19 Viral Illness during the ongoing 2020 Covid 19 Pandemic Patient had to be intubated.  She is currently on mechanical ventilation.  Management per pulmonology.  Patient has completed treatment with hydroxychloroquine and azithromycin.   Septic shock Patient noted to be on low-dose levo fed this morning.  Apparently became hypotensive after she was given furosemide yesterday.  Should be able to titrate this to off sometime today.    Staph epidermidis bacteremia  Patient was noted to be febrile over the last few days.  Blood cultures positive for staph epidermidis in both sets.  Could be the reason for her fever.  She does have a PICC line in the left upper extremity.  No erythema is noted.  Discussed with Dr. Prince Rome with infectious disease who recommended vancomycin.  Repeat cultures through peripheral as well as PICC line.  No need to  pull out PICC line as of now.    ARF Resolved.  Monitor urine output.  Good response to furosemide last night.  Iron deficiency anemia, acute on chronic Overt blood loss.  Hemoglobin is stable.  Continue to monitor.    Hypokalemia and hypomagnesemia Magnesium noted to be 2.5 today.  Potassium 3.6.  Will give additional dose through the tube.  Morbid obesity with underlying obstructive sleep apnea Body mass index is 41.16 kg/m.  Nutrition Tube feedings via G-tube.  Free water flushes ordered.  History of CVA Currently on Plavix, started on low-dose statin.  Dyslipidemia Home dose statin replaced.    SVT Short burst of SVT during CPAP trial on 06/02/2018.  Resolved with beta-blockers.  Continue as needed. Recent TTE shows an EF of 65% no WMA.  Acute Metabolic encephalopathy Underlying Dementia, 2 recent CVAs - continues to be encephalopathic despite sedation cessation.  CT head did not show any acute findings. Stable B12, folate, RPR.  TSH and cortisol were relatively stable.    DM2 Monitor CBGs.  Continue SSI.- ISS  Code Status :  Partial with No CPR Disposition Plan  :  ICU  Consults  :  PCCM  Procedures  :   Endotracheal intubation- 05/23/2018 L.Arm PICC - 05/30/18  Blood cx 05/31/2018- NGTD Novel Coronavirus Positive result called 4/4. Tracheal aspirate 4/7>>> moderate WBC (abundant GPC and GNR)  MRSA 4/5> neg  Blood cultures from 4/15: Positive for staph epidermidis  DVT Prophylaxis  :  Lovenox    Lab Results  Component Value Date   PLT 324 06/07/2018    Inpatient Medications  Scheduled Meds:  atorvastatin  20 mg Per Tube q1800   budesonide (PULMICORT) nebulizer solution  0.25 mg Nebulization BID   chlorhexidine gluconate (MEDLINE KIT)  15 mL Mouth Rinse BID   Chlorhexidine Gluconate Cloth  6 each Topical Daily   clopidogrel  75 mg Per Tube Daily   docusate  50 mg Oral BID   enoxaparin (LOVENOX) injection  60 mg Subcutaneous Q24H   feeding  supplement (JEVITY 1.2 CAL)  1,000 mL Per Tube Q24H   feeding supplement (PRO-STAT SUGAR FREE 64)  30 mL Per Tube TID WC   free water  200 mL Per Tube Q6H   insulin aspart  0-15 Units Subcutaneous Q4H   mouth rinse  15 mL Mouth Rinse 10 times per day   metoprolol tartrate  12.5 mg Per Tube BID   pantoprazole sodium  40 mg Per Tube Daily   sennosides  5 mL Oral QHS   silver sulfADIAZINE  1 application Topical Daily   sodium chloride flush  10-40 mL Intracatheter Q12H   sodium chloride flush  3 mL Intravenous Q12H   venlafaxine  37.5 mg Per Tube QHS   venlafaxine  75 mg Per Tube q morning - 10a   Continuous Infusions:  dexmedetomidine (PRECEDEX) IV infusion Stopped (06/02/18 0800)   lactated ringers 50 mL/hr at 06/07/18 0900   norepinephrine (LEVOPHED) Adult infusion 5 mcg/min (06/07/18 0900)   vancomycin     PRN Meds:.acetaminophen, bisacodyl, fentaNYL (SUBLIMAZE) injection, ipratropium, levalbuterol, metoprolol tartrate, midazolam, [DISCONTINUED] ondansetron **OR** ondansetron (ZOFRAN) IV, Resource ThickenUp Clear  Antibiotics  :    Anti-infectives (From admission, onward)   Start     Dose/Rate Route Frequency Ordered Stop   06/07/18 1500  vancomycin (VANCOCIN) 1,500 mg in sodium chloride 0.9 % 500 mL IVPB     1,500 mg 250 mL/hr over 120 Minutes Intravenous Every 24 hours 06/06/18 1413     06/06/18 1500  vancomycin (VANCOCIN) 2,000 mg in sodium chloride 0.9 % 500 mL IVPB     2,000 mg 250 mL/hr over 120 Minutes Intravenous  Once 06/06/18 1413 06/06/18 1700   05/27/18 2200  ceFEPIme (MAXIPIME) 2 g in sodium chloride 0.9 % 100 mL IVPB  Status:  Discontinued     2 g  200 mL/hr over 30 Minutes Intravenous Every 8 hours 05/27/18 1322 05/27/18 1400   05/27/18 1200  ceFEPIme (MAXIPIME) 2 g in sodium chloride 0.9 % 100 mL IVPB     2 g 200 mL/hr over 30 Minutes Intravenous  Once 05/27/18 1153 05/27/18 1330   05/26/18 2200  azithromycin (ZITHROMAX) tablet 500 mg  Status:   Discontinued     500 mg Oral Daily 05/26/18 0715 05/26/18 1502   05/26/18 2200  azithromycin (ZITHROMAX) tablet 500 mg     500 mg Per Tube Daily 05/26/18 1502 05/27/18 2141   05/24/18 2200  hydroxychloroquine (PLAQUENIL) tablet 200 mg     200 mg Oral 2 times daily 05/23/18 2113 05/28/18 1025   05/23/18 2300  azithromycin (ZITHROMAX) 500 mg in sodium chloride 0.9 % 250 mL IVPB  Status:  Discontinued     500 mg 250 mL/hr over 60 Minutes Intravenous Every 24 hours 05/23/18 2250 05/26/18 0715   05/23/18 2200  hydroxychloroquine (PLAQUENIL) tablet 400 mg     400 mg Oral 2 times daily 05/23/18 2113 05/24/18 1114   05/23/18 1500  vancomycin (VANCOCIN) 1,250 mg in sodium chloride 0.9 % 250 mL IVPB  Status:  Discontinued     1,250 mg 166.7 mL/hr over 90 Minutes Intravenous Every 24 hours 06/13/2018 1532 05/25/18 1141   05/23/18 0230  ceFEPIme (MAXIPIME) 2 g in sodium chloride 0.9 % 100 mL IVPB  Status:  Discontinued     2 g 200 mL/hr over 30 Minutes Intravenous Every 12 hours 05/24/2018 1532 05/25/18 1334   05/25/2018 1545  metroNIDAZOLE (FLAGYL) IVPB 500 mg     500 mg 100 mL/hr over 60 Minutes Intravenous  Once 06/10/2018 1531 05/20/2018 1731   06/11/2018 1530  vancomycin (VANCOCIN) IVPB 1000 mg/200 mL premix     1,000 mg 200 mL/hr over 60 Minutes Intravenous  Once 06/02/2018 1444 05/21/2018 1731   06/16/2018 1415  vancomycin (VANCOCIN) IVPB 1000 mg/200 mL premix     1,000 mg 200 mL/hr over 60 Minutes Intravenous  Once 05/27/2018 1401 05/20/2018 1538   06/02/2018 1415  ceFEPIme (MAXIPIME) 2 g in sodium chloride 0.9 % 100 mL IVPB     2 g 200 mL/hr over 30 Minutes Intravenous  Once 06/10/2018 1401 06/18/2018 1509         Objective:   Vitals:   06/07/18 0822 06/07/18 0900 06/07/18 0930 06/07/18 1000  BP:   101/76 112/83  Pulse:   89 91  Resp:   (!) 22 (!) 21  Temp:      TempSrc:      SpO2: 100% 100% 99% 97%  Weight:      Height:        Wt Readings from Last 3 Encounters:  06/07/18 122.8 kg  05/19/18 131.9  kg  05/04/18 127 kg     Intake/Output Summary (Last 24 hours) at 06/07/2018 1235 Last data filed at 06/07/2018 0900 Gross per 24 hour  Intake 2415.79 ml  Output 3750 ml  Net -1334.21 ml     Physical Exam  General appearance: Opens her eyes to verbal stimuli.  Intubated sedated. Resp: Mildly tachypneic at rest.  Coarse breath sounds bilaterally. Cardio: S1-S2 is normal regular.  No S3-S4.  No rubs murmurs or bruit GI: Abdomen is soft.  Nontender nondistended.  Bowel sounds are present normal.  No masses organomegaly Neurologic: Remains encephalopathic.  Noted to move her extremities.     Data Review:    CBC Recent Labs  Lab 06/02/18  0500  06/03/18 0435  06/04/18 0400  06/05/18 0500 06/05/18 1440 06/06/18 0235 06/06/18 0440 06/07/18 0310  WBC 7.3  --  9.9  --  8.8  --  8.0  --  7.8  --  8.8  HGB 7.4*   < > 8.1*  --  8.0*   < > 8.3* 9.2* 7.6* 8.8* 8.5*  HCT 26.9*   < > 29.3*   < > 28.6*   < > 30.0* 27.0* 26.9* 26.0* 29.7*  PLT 264  --  322  --  305  --  314  --  280  --  324  MCV 92.1  --  92.1  --  92.3  --  92.6  --  92.4  --  91.7  MCH 25.3*  --  25.5*  --  25.8*  --  25.6*  --  26.1  --  26.2  MCHC 27.5*  --  27.6*  --  28.0*  --  27.7*  --  28.3*  --  28.6*  RDW 20.8*  --  21.6*  --  21.6*  --  21.6*  --  21.8*  --  21.6*  LYMPHSABS 1.1  --  1.1  --  1.2  --  1.1  --  1.1  --   --   MONOABS 0.3  --  0.4  --  0.3  --  0.4  --  0.4  --   --   EOSABS 0.1  --  0.0  --  0.0  --  0.3  --  0.2  --   --   BASOSABS 0.0  --  0.1  --  0.1  --  0.1  --  0.1  --   --    < > = values in this interval not displayed.    Chemistries  Recent Labs  Lab 06/02/18 0500  06/03/18 0435 06/04/18 0400  06/05/18 0500 06/05/18 1440 06/05/18 2350 06/06/18 0235 06/06/18 0440 06/07/18 0310  NA 145   < > 146* 146*   < > 148* 131* 133* 135 147* 141  K 4.3   < > 4.0 4.0   < > 2.6* 4.1 3.8 3.8 4.1 3.6  CL 107  --  108 108  --  124* 94* 101 101  --  104  CO2 31  --  30 31  --  21*  --   26 27  --  28  GLUCOSE 131*  --  119* 129*  --  89 656* 501* 419*  --  121*  BUN 44*  --  49* 49*  --  37* 32* 35* 35*  --  34*  CREATININE 0.97  --  1.02* 1.04*  --  0.74 0.90 0.99 1.00  --  1.08*  CALCIUM 8.0*  --  7.9* 8.1*  --  5.4*  --  7.1* 7.3*  --  7.6*  MG 2.5*  --   --   --   --  1.6*  --   --  2.4  --  2.5*  AST 31  --  32 31  --  23  --   --  31  --   --   ALT 20  --  19 20  --  14  --   --  17  --   --   ALKPHOS 135*  --  150* 137*  --  82  --   --  109  --   --   BILITOT  1.3*  --  1.2 1.2  --  0.9  --   --  1.1  --   --    < > = values in this interval not displayed.    Coagulation profile Recent Labs  Lab 06/04/18 1043  INR 1.4*    Cardiac Enzymes Recent Labs  Lab 06/01/18 0406  TROPONINI <0.03   ------------------------------------------------------------------------------------------------------------------    Component Value Date/Time   BNP 2,117.1 (H) 06/06/2018 1402    Micro Results Recent Results (from the past 240 hour(s))  Culture, Urine     Status: None   Collection Time: 06/03/18  1:48 PM  Result Value Ref Range Status   Specimen Description   Final    URINE, CATHETERIZED Performed at [facility], [place] [address] [place], [place] [address]    Special Requests   Final    NONE Performed at [facility], [place] [address] [place], [place] [address]    Culture   Final    NO GROWTH Performed at [facility], [place] [address] [place], [place] [address]    Report Status 06/04/2018 FINAL  Final  Culture, blood (routine x 2)     Status: Abnormal   Collection Time: 06/03/18  6:15 PM  Result Value Ref Range Status   Specimen Description   Final    BLOOD BLOOD RIGHT HAND Performed at [facility] [address], [place], [place] [address]    Special Requests   Final    BOTTLES DRAWN AEROBIC AND ANAEROBIC Blood Culture adequate volume Performed at [facility] [address], [place], [place] [address]    Culture  Setup Time   Final    ANAEROBIC BOTTLE ONLY GRAM POSITIVE COCCI CRITICAL RESULT CALLED TO, READ BACK BY AND VERIFIED WITH: J.MIZE,RN AT 0022 ON 06/05/18 BY G.MCADOO Performed at [facility], [place] [address] [place],  [address]    Culture STAPHYLOCOCCUS EPIDERMIDIS (A)  Final   Report Status 06/06/2018 FINAL  Final   Organism ID, Bacteria STAPHYLOCOCCUS EPIDERMIDIS  Final      Susceptibility   Staphylococcus epidermidis - MIC*    CIPROFLOXACIN <=0.5 SENSITIVE Sensitive     ERYTHROMYCIN >=8 RESISTANT Resistant     GENTAMICIN <=0.5 SENSITIVE Sensitive     OXACILLIN >=4 RESISTANT Resistant     TETRACYCLINE >=16 RESISTANT Resistant     VANCOMYCIN 2 SENSITIVE Sensitive     TRIMETH/SULFA <=10 SENSITIVE Sensitive     CLINDAMYCIN <=0.25 SENSITIVE Sensitive     RIFAMPIN <=0.5 SENSITIVE Sensitive     Inducible Clindamycin NEGATIVE Sensitive     * STAPHYLOCOCCUS EPIDERMIDIS  Blood Culture ID Panel (Reflexed)     Status: Abnormal   Collection Time: 06/03/18  6:15 PM  Result Value Ref Range Status   Enterococcus species NOT DETECTED NOT DETECTED Final   Listeria monocytogenes NOT DETECTED NOT DETECTED Final   Staphylococcus species DETECTED (A) NOT DETECTED Final    Comment: Methicillin (oxacillin) resistant coagulase negative staphylococcus. Possible blood culture contaminant (unless isolated from more than one blood culture draw or clinical case suggests pathogenicity). No antibiotic treatment is indicated for blood  culture contaminants. CRITICAL RESULT CALLED TO, READ BACK BY AND VERIFIED WITH: J.MIZE,RN AT 0022 ON 06/05/18 BY G.MCADOO    Staphylococcus aureus (BCID) NOT DETECTED NOT DETECTED Final   Methicillin resistance DETECTED (A) NOT DETECTED Final    Comment: CRITICAL RESULT CALLED TO, READ BACK BY AND VERIFIED WITH: J.MIZE,RN  AT 0022 ON 06/05/18 BY G.MCADOO    Streptococcus species NOT DETECTED NOT DETECTED Final    Streptococcus agalactiae NOT DETECTED NOT DETECTED Final   Streptococcus pneumoniae NOT DETECTED NOT DETECTED Final   Streptococcus pyogenes NOT DETECTED NOT DETECTED Final   Acinetobacter baumannii NOT DETECTED NOT DETECTED Final   Enterobacteriaceae species NOT DETECTED NOT DETECTED Final   Enterobacter cloacae complex NOT DETECTED NOT DETECTED Final   Escherichia coli NOT DETECTED NOT DETECTED Final   Klebsiella oxytoca NOT DETECTED NOT DETECTED Final   Klebsiella pneumoniae NOT DETECTED NOT DETECTED Final   Proteus species NOT DETECTED NOT DETECTED Final   Serratia marcescens NOT DETECTED NOT DETECTED Final   Haemophilus influenzae NOT DETECTED NOT DETECTED Final   Neisseria meningitidis NOT DETECTED NOT DETECTED Final   Pseudomonas aeruginosa NOT DETECTED NOT DETECTED Final   Candida albicans NOT DETECTED NOT DETECTED Final   Candida glabrata NOT DETECTED NOT DETECTED Final   Candida krusei NOT DETECTED NOT DETECTED Final   Candida parapsilosis NOT DETECTED NOT DETECTED Final   Candida tropicalis NOT DETECTED NOT DETECTED Final    Comment: Performed at [facility], [place] [address] [place], [place] [address]  Culture, blood (routine x 2)     Status: Abnormal (Preliminary result)   Collection Time: 06/03/18  6:30 PM  Result Value Ref Range Status   Specimen Description   Final    BLOOD BLOOD RIGHT WRIST Performed at [facility] [facility] [address], [place], [place] [address]    Special Requests   Final    BOTTLES DRAWN AEROBIC ONLY Blood Culture adequate volume Performed at [facility] [address] [place], [place] [address]    Culture  Setup Time   Final    AEROBIC BOTTLE ONLY GRAM POSITIVE COCCI CRITICAL VALUE NOTED.  VALUE IS CONSISTENT WITH PREVIOUSLY REPORTED AND CALLED VALUE.    Culture (A)  Final    STAPHYLOCOCCUS EPIDERMIDIS SUSCEPTIBILITIES PERFORMED ON PREVIOUS CULTURE WITHIN THE LAST 5 DAYS. Performed at [facility], [place] [address] [place], [place] [address]    Report Status PENDING  Incomplete  Culture, blood (Routine X 2) w Reflex to ID Panel     Status: None (Preliminary result)   Collection Time: 06/06/18  1:43 PM  Result Value Ref Range Status   Specimen Description   Final    BLOOD RIGHT HAND Performed at [facility] [address], [place], [place] [address]    Special Requests   Final    BOTTLES DRAWN AEROBIC ONLY Blood Culture results may not be optimal due to an inadequate volume of blood received in culture bottles Performed at [place] [address], [place], [place] [address]    Culture   Final    NO GROWTH < 12 HOURS Performed at [facility] [address], [place], [place] [address]    Report Status PENDING  Incomplete  Culture, blood (Routine X 2) w Reflex to ID Panel     Status: None (Preliminary result)   Collection Time: 06/06/18  1:48 PM  Result Value Ref Range Status   Specimen Description   Final    BLOOD A-LINE Performed at [facility], [place] [address], [place], [place] [address]    Special Requests   Final    BOTTLES DRAWN AEROBIC ONLY Blood Culture adequate volume Performed at [facility] [address], [place], [place] [address]  Culture   Final    NO GROWTH < 12 HOURS Performed at [facility], [place] [address], [place], [place] [address]    Report Status PENDING  Incomplete    Radiology Reports  Ct Head Wo Contrast  Result Date: 06/05/2018 CLINICAL DATA:  68 y/o  F; COVID positive, encephalopathy. EXAM: CT HEAD WITHOUT CONTRAST TECHNIQUE: Contiguous axial images were obtained from the base of the skull through the vertex without intravenous contrast. COMPARISON:  03/31/2018 MRI of the head. FINDINGS: Brain: Motion degraded study. No evidence of acute infarction, hemorrhage, hydrocephalus, extra-axial collection or mass lesion/mass effect. Stable chronic infarct in  the right frontal lobe. Stable nonspecific white matter hypodensities compatible with chronic microvascular ischemic changes and advanced volume loss of the brain. Dystrophic calcifications in the basal ganglia and scattered in the right cerebral cortex. Vascular: Calcific atherosclerosis of carotid siphons. No hyperdense vessel identified. Skull: Normal. Negative for fracture or focal lesion. Sinuses/Orbits: No acute finding. Other: Bilateral intra-ocular lens replacement. IMPRESSION: 1. Motion degraded study. No acute intracranial abnormality identified. 2. Stable chronic right frontal lobe infarct, chronic microvascular ischemic changes, and advanced volume loss of the brain. Electronically Signed   By: Kristine Garbe M.D.   On: 06/05/2018 22:08   Ct Chest Wo Contrast  Result Date: 05/24/2018 CLINICAL DATA:  68 year old female with shortness of breath, fever. Patient also has a history of congestive heart failure. EXAM: CT CHEST WITHOUT CONTRAST TECHNIQUE: Multidetector CT imaging of the chest was performed following the standard protocol without IV contrast. COMPARISON:  Chest x-ray obtained earlier today; prior chest x-ray 05/19/18 FINDINGS: Cardiovascular: Limited evaluation in the absence of intravenous contrast. Cardiomegaly. Calcification of the mitral valve annulus. Coronary artery calcifications also present. Atherosclerotic calcifications throughout the aorta. No pericardial effusion. Mediastinum/Nodes: Thyroid gland is unremarkable. Borderline enlarged mediastinal lymph nodes. Index right paratracheal lymph node measures 1.2 cm in short axis. Index low right paratracheal lymph node measures 1.2 cm in short axis. Unremarkable thoracic esophagus. Lungs/Pleura: Extensive patchy ground-glass and more consolidative airspace opacities in a peripheral and peribronchovascular distribution throughout all lobes of both lungs but preferentially within the upper lobes. There is relative sparing of the  middle lobe. Somewhat less significant disease within the dependent portions of the right lower lobe makes aspiration less likely. Additionally, there is mild bilateral lower lobe atelectasis secondary to the presence of small bilateral pleural effusions. Upper Abdomen: Small volume perihepatic ascites. Relative right renal atrophy. Musculoskeletal: No acute fracture or aggressive appearing lytic or blastic osseous lesion. IMPRESSION: 1. There are a spectrum of findings in the lungs which can be seen with acute atypical infection (as well as other non-infectious etiologies). In particular, viral pneumonia (including COVID-19) should be considered in the appropriate clinical setting. Given the upper lung predominant pattern of distribution, aspiration is considered significantly less likely despite the patient's underlying risk factors. Similarly, congestive heart failure is considered less likely given the absence of interlobular septal thickening. 2. Small to moderate bilateral layering pleural effusions. 3. Small volume perihepatic site ease of uncertain etiology. 4. Borderline enlarged mediastinal lymph nodes are favored to be reactive. 5. Coronary artery calcifications. 6.  Aortic Atherosclerosis (ICD10-170.0). 7. Relative right renal atrophy. These results were called by telephone at the time of interpretation on 06/18/2018 at 4:42 pm to Dr. Evangeline Gula, who verbally acknowledged these results. Electronically Signed   By: Jacqulynn Cadet M.D.   On: 06/11/2018 16:43   Dg Chest Port 1 View  Result Date: 06/06/2018 CLINICAL DATA:  Acute respiratory failure EXAM: PORTABLE CHEST 1 VIEW COMPARISON:  06/04/2018 and prior radiographs FINDINGS: Cardiomediastinal silhouette is unchanged. An endotracheal tube, LEFT PICC line and NG tube are unchanged. Bilateral airspace opacities are stable from the prior study. Bilateral pleural effusions again noted.  No pneumothorax. IMPRESSION: Unchanged appearance of the chest with  bilateral airspace opacities and bilateral pleural effusions. Electronically Signed   By: Margarette Canada M.D.   On: 06/06/2018 13:21   Dg Chest Port 1 View  Result Date: 06/04/2018 CLINICAL DATA:  Shortness of breath. EXAM: PORTABLE CHEST 1 VIEW COMPARISON:  06/02/2018 FINDINGS: Endotracheal tube has tip 3.6 cm above the carina. Enteric tube courses into the region of the stomach and off the film as tip not visualized. Left-sided PICC line unchanged. Lungs are adequately inflated demonstrate hazy bilateral perihilar opacification with slight interval worsening over the right upper perihilar region. Findings may be due to interstitial edema versus infection. Stable bibasilar opacification likely small effusions with atelectasis. Stable cardiomegaly. Remainder the exam is unchanged. IMPRESSION: Hazy bilateral perihilar opacification with interval worsening over the right upper perihilar region. Findings may be due to interstitial edema versus infection. Stable bibasilar opacification likely small effusions with atelectasis. Stable cardiomegaly. Tubes and lines as described. Electronically Signed   By: Marin Olp M.D.   On: 06/04/2018 09:04   Dg Chest Port 1 View  Result Date: 06/02/2018 CLINICAL DATA:  Short of breath EXAM: PORTABLE CHEST 1 VIEW COMPARISON:  05/30/2018 FINDINGS: Endotracheal and NG tubes are stable. Left PICC placed. Tip is at the cavoatrial junction. Diffuse airspace disease and bilateral pleural effusions are stable. Cardiac silhouette is prominent likely due to AP technique. No pneumothorax. IMPRESSION: Stable diffuse bilateral airspace disease and bilateral pleural effusions. Left PICC placed.  Tip is at the cavoatrial junction. Electronically Signed   By: Marybelle Killings M.D.   On: 06/02/2018 07:40   Dg Chest Port 1 View  Result Date: 05/30/2018 CLINICAL DATA:  ARDS EXAM: PORTABLE CHEST 1 VIEW COMPARISON:  05/28/2018 FINDINGS: Endotracheal tube in good position.  NG tube in the stomach.  Cardiac enlargement. Extensive diffuse bilateral airspace disease unchanged. Bilateral pleural effusions and bibasilar atelectasis unchanged. IMPRESSION: Diffuse bilateral airspace disease and bilateral effusions unchanged. Electronically Signed   By: Franchot Gallo M.D.   On: 05/30/2018 08:14   Dg Chest Port 1 View  Result Date: 05/28/2018 CLINICAL DATA:  68 year old female with ARDS EXAM: PORTABLE CHEST 1 VIEW COMPARISON:  Multiple prior most recent 05/27/2018, 05/23/2018, CT 06/08/2018 FINDINGS: Cardiomediastinal silhouette unchanged in size and contour, with cardiomegaly. Endotracheal tube unchanged terminating 2.7 cm above the carina. Unchanged gastric tube terminating out of the field of view. Low lung volumes with veil opacities at the bilateral lung bases obscuring the hemidiaphragm. Patchy airspace and interstitial opacities bilaterally, similar to the comparison. No pneumothorax. IMPRESSION: Unchanged endotracheal tube and gastric tube. Similar appearance of mixed interstitial and airspace opacities of the bilateral lungs, with bibasilar pleural effusions. Electronically Signed   By: Corrie Mckusick D.O.   On: 05/28/2018 07:53   Dg Chest Port 1 View  Result Date: 05/27/2018 CLINICAL DATA:  68 year old female with respiratory failure EXAM: PORTABLE CHEST 1 VIEW COMPARISON:  05/23/2018, 06/05/2018, 05/19/2018 FINDINGS: Cardiomediastinal silhouette unchanged in size and contour. Endotracheal tube terminates 2.7 cm above the carina. Unchanged gastric tube, with the tip terminating just below the diaphragm. No pneumothorax. Worsening airspace opacity in the left lung and right mid lung with veil opacity at the lung bases obscuring the hemidiaphragms  and heart borders. Interval removal of the defibrillator pads. IMPRESSION: Unchanged endotracheal tube and gastric tube. Persisting interstitial and airspace opacity of the lungs, worsening on the left. Small bilateral pleural effusions and associated  atelectasis/consolidation. Electronically Signed   By: Corrie Mckusick D.O.   On: 05/27/2018 08:02   Dg Chest Port 1 View  Result Date: 05/23/2018 CLINICAL DATA:  Intubation EXAM: PORTABLE CHEST 1 VIEW COMPARISON:  05/23/2018 FINDINGS: Endotracheal tube tip is 3 cm above the carina. Orogastric or nasogastric tube enters the abdomen. The tip may be just beyond the gastroesophageal junction. Widespread bilateral pulmonary infiltrates persist. No worsening or new finding by radiography. IMPRESSION: Endotracheal tube 3 cm above the carina. Orogastric or nasogastric tube enters the abdomen, just beyond the gastroesophageal junction. Widespread bilateral infiltrates persist. Electronically Signed   By: Nelson Chimes M.D.   On: 05/23/2018 21:38   Dg Chest Port 1 View  Result Date: 06/04/2018 CLINICAL DATA:  Shortness of breath since yesterday. Tachycardia. Former smoker. EXAM: PORTABLE CHEST 1 VIEW COMPARISON:  05/19/2018 FINDINGS: Lungs are adequately inflated and demonstrate worsening of bilateral multifocal airspace opacification which is most prominent over the right upper lobe and left perihilar region. Possible small amount left pleural fluid unchanged. Mild stable cardiomegaly. Remainder the exam is unchanged. IMPRESSION: Interval worsening bilateral multifocal airspace process most prominent over the right upper lobe and left perihilar region likely multifocal pneumonia. Small amount left pleural fluid unchanged. Mild stable cardiomegaly. Electronically Signed   By: Marin Olp M.D.   On: 05/30/2018 14:29   Dg Chest Port 1 View  Result Date: 05/19/2018 CLINICAL DATA:  Respiratory failure and congestive heart failure. EXAM: PORTABLE CHEST 1 VIEW COMPARISON:  05/18/2018 FINDINGS: Stable cardiac enlargement. Slight diminishment in CHF since the prior study. There is some asymmetric vascular congestion versus atelectasis in the right upper lung. Stable left basilar atelectasis and probable component of small  to moderate left pleural effusion. IMPRESSION: Decreased prominence of CHF since the prior chest x-ray. Asymmetric vascular congestion versus atelectasis in the right upper lung. Left basilar atelectasis with probable component of left pleural fluid. Electronically Signed   By: Aletta Edouard M.D.   On: 05/19/2018 08:27   Dg Chest Port 1 View  Result Date: 05/18/2018 CLINICAL DATA:  Short of breath EXAM: PORTABLE CHEST 1 VIEW COMPARISON:  05/17/2018 FINDINGS: Stable enlarged cardiac silhouette. Bilateral pleural effusions similar prior. A central venous congestion. No focal consolidation. No pneumothorax. IMPRESSION: Cardiomegaly with small bilateral pleural effusions. Concern for mild congestive heart failure Electronically Signed   By: Suzy Bouchard M.D.   On: 05/18/2018 08:16   Dg Chest Port 1 View  Result Date: 05/17/2018 CLINICAL DATA:  Shortness of breath EXAM: PORTABLE CHEST 1 VIEW COMPARISON:  05/16/2018 FINDINGS: Cardiomegaly with mild perihilar edema and small bilateral pleural effusions, grossly unchanged. No pneumothorax. IMPRESSION: Cardiomegaly mild interstitial edema and small bilateral pleural effusions, grossly unchanged. Electronically Signed   By: Julian Hy M.D.   On: 05/17/2018 08:11   Dg Chest Port 1 View  Result Date: 05/16/2018 CLINICAL DATA:  Shortness of breath EXAM: PORTABLE CHEST 1 VIEW COMPARISON:  05/15/2018 FINDINGS: Cardiomegaly with mild perihilar edema. Small to moderate bilateral pleural effusions, increased. No pneumothorax. IMPRESSION: Cardiomegaly with mild perihilar edema and small to moderate bilateral pleural effusions, increased. Electronically Signed   By: Julian Hy M.D.   On: 05/16/2018 07:41   Dg Chest Port 1 View  Result Date: 05/15/2018 CLINICAL DATA:  Shortness of breath EXAM: PORTABLE  CHEST 1 VIEW COMPARISON:  May 14, 2018 FINDINGS: There is persistent cardiomegaly. Pulmonary vascularity is within normal limits. Currently there is  no appreciable edema or consolidation. No adenopathy. No bone lesions. There is aortic atherosclerosis. IMPRESSION: Cardiomegaly. No edema or consolidation. Pulmonary vascularity within normal limits. Aortic Atherosclerosis (ICD10-I70.0). Electronically Signed   By: Lowella Grip III M.D.   On: 05/15/2018 07:35   Dg Chest Port 1 View  Result Date: 05/14/2018 CLINICAL DATA:  Aspiration EXAM: PORTABLE CHEST 1 VIEW COMPARISON:  May 14, 2018 study obtained earlier in the day FINDINGS: There is cardiomegaly with mild pulmonary venous hypertension. There are pleural effusions bilaterally. There is no appreciable edema or consolidation. There is aortic atherosclerosis. No adenopathy. No bone lesions. IMPRESSION: Pulmonary vascular congestion with small pleural effusions bilaterally. No frank edema or consolidation. Aortic Atherosclerosis (ICD10-I70.0). Electronically Signed   By: Lowella Grip III M.D.   On: 05/14/2018 16:00   Dg Chest Port 1 View  Result Date: 05/14/2018 CLINICAL DATA:  Short of breath EXAM: PORTABLE CHEST 1 VIEW COMPARISON:  05/06/2018 FINDINGS: Cardiac enlargement with mild progression of vascular congestion. Progression of bibasilar atelectasis and small effusions. Atherosclerotic ascending aorta and aortic arch. IMPRESSION: Congestive heart failure with mild progression. Electronically Signed   By: Franchot Gallo M.D.   On: 05/14/2018 08:31   Vas [place] Lower Extremity Venous (dvt)  Result Date: 06/06/2018  Lower Venous Study Indications: Swelling, and Elevated d-Dimer and fever. Other Indications: Positive Covid-19. Limitations: Body habitus and poor ultrasound/tissue interface. Performing Technologist: Oda Cogan RDMS, RVT  Examination Guidelines: A complete evaluation includes B-mode imaging, spectral Doppler, color Doppler, and power Doppler as needed of all accessible portions of each vessel. Bilateral testing is considered an integral part of a complete examination.  Limited examinations for reoccurring indications may be performed as noted.  +---------+---------------+---------+-----------+----------+--------------+  RIGHT     Compressibility Phasicity Spontaneity Properties Summary         +---------+---------------+---------+-----------+----------+--------------+  CFV       Full            Yes       Yes                                    +---------+---------------+---------+-----------+----------+--------------+  SFJ       Full                                                             +---------+---------------+---------+-----------+----------+--------------+  FV Prox   Full                                                             +---------+---------------+---------+-----------+----------+--------------+  FV Mid    Full                                                             +---------+---------------+---------+-----------+----------+--------------+  FV Distal Full                                                             +---------+---------------+---------+-----------+----------+--------------+  PFV       Full                                                             +---------+---------------+---------+-----------+----------+--------------+  POP       Full                                                             +---------+---------------+---------+-----------+----------+--------------+  PTV                                                        Not visualized  +---------+---------------+---------+-----------+----------+--------------+  PERO                                                       Not visualized  +---------+---------------+---------+-----------+----------+--------------+ Calf veins were poorly visualized cannot exclude DVT in this area.  +---------+---------------+---------+-----------+----------+--------------+  LEFT      Compressibility Phasicity Spontaneity Properties Summary          +---------+---------------+---------+-----------+----------+--------------+  CFV       Full            Yes       Yes                                    +---------+---------------+---------+-----------+----------+--------------+  SFJ       Full                                                             +---------+---------------+---------+-----------+----------+--------------+  FV Prox   Full                                                             +---------+---------------+---------+-----------+----------+--------------+  FV Mid    Full                                                             +---------+---------------+---------+-----------+----------+--------------+  FV Distal Full                                                             +---------+---------------+---------+-----------+----------+--------------+  PFV       Full                                                             +---------+---------------+---------+-----------+----------+--------------+  POP       Full                                                             +---------+---------------+---------+-----------+----------+--------------+  PTV                                                        Not visualized  +---------+---------------+---------+-----------+----------+--------------+  PERO                                                       Not visualized  +---------+---------------+---------+-----------+----------+--------------+ Calf veins were poorly visualized cannot exclude DVT in this area.  Left Technical Findings: Test performed at [facility].   Summary: Right: There is no evidence of deep vein thrombosis in the lower extremity. However, portions of this examination were limited- see technologist comments above. Left: There is no evidence of deep vein thrombosis in the lower extremity. However, portions of this examination were limited- see technologist comments above.  *See table(s) above for measurements and  observations. Electronically signed by Servando Snare MD on 06/06/2018 at 10:26:42 AM.    Final    [place] Ekg Site Rite  Result Date: 05/30/2018 If Site Rite image not attached, placement could not be confirmed due to current cardiac rhythm.  [place] Ekg Site Rite  Result Date: 05/30/2018 If Site Rite image not attached, placement could not be confirmed due to current cardiac rhythm.  US Abdomen Limited Ruq  Result Date: 05/14/2018 CLINICAL DATA:  Elevated LFTs. EXAM: ULTRASOUND ABDOMEN LIMITED RIGHT UPPER QUADRANT COMPARISON:  None. FINDINGS: Gallbladder: Physiologically distended. No gallstones. No significant gallbladder wall thickening. No sonographic Murphy sign noted by sonographer. Common bile duct: Diameter: 5 mm. Liver: No focal lesion identified. Diffusely heterogeneous and slightly increased in parenchymal echogenicity. Portal vein is patent on color Doppler imaging with normal direction of blood flow towards the liver. Small amount of right upper quadrant ascites. Right pleural effusion noted. IMPRESSION: 1. Mild hepatic steatosis. 2. No gallstones.  No biliary dilatation. 3. Small volume of ascites.  Right pleural effusion. Electronically Signed   By: Keith Rake M.D.   On: 05/14/2018 22:18  Bonnielee Haff 06/07/2018   To page go to [email or web address] - password [facility]  [facility] -  Office  [phone]

## 2018-06-07 NOTE — Progress Notes (Signed)
Spoke with son Mitzi Hansen and wife via [place] video chat. Discussed Miss Ritas plan of care and progress. They were so happy to see her more alert and engaged in her name recgognition with tracking there voices. They also expressed their appreciation to the excellent care given to her.

## 2018-06-07 NOTE — Progress Notes (Signed)
NAME:  Shannon Carroll, MRN:  [id number], DOB:  [birth year], LOS: 59 ADMISSION DATE:  06/14/2018, CONSULTATION DATE:  05/23/2018 REFERRING MD:  Glenna Durand, CHIEF COMPLAINT:  SVT hpoxemia, COVID 7 POS   Brief History   68 yr old female with PMHx HFpEF, COPD, w/ bilateral infiltrates COVID positive on supplemental oxygen 8L with increased work of breathing, fatigue, and diaphoretic. PCCM consulted for acute respiratory distress.  Significant Hospital Events   4/3 + COVID -19  4/4 ICU admit intubated  4/12 weaning trial, failed, de-sat, SVT  4/15 episodes of SVT  Consults:  PCCM   Procedures:  Endotracheal intubation- 05/23/2018  Significant Diagnostic Tests:  Bilateral alveolar infiltrates on CXR -CXR 4/8 with worsening LLL and RML opacity  4/18 CT head > stable R frontal infarct, NAICP  Micro Data:  Blood cx 06/06/2018- NGTD Novel Coronavirus Positive result called 4/4. Tracheal aspirate 4/7>>> moderate WBC (abundant GPC and GNR)   4/15 blood > staph epidermidis 2/4 bottles 4/15 urine > neg 4/18 blood >    Antimicrobials:  Vancomycin- 05/23/2018 Cefepime- 05/23/2018-4/6, 4/8>>  Hydroxychloroquine- upon transfer to ICU -COVID (to end 4/9) Axithromycin - upon transfer to ICU- COVID (to end 4/8)  4/18 vancomycin  Interim history/subjective:   Minimally responsive On antibiotics  Objective   Blood pressure 101/84, pulse 92, temperature 99.1 F (37.3 C), temperature source Oral, resp. rate (!) 25, height 5\' 8"  (1.727 m), weight 122.8 kg, SpO2 100 %.    Vent Mode: PRVC;PSV FiO2 (%):  [30 %] 30 % Set Rate:  [12 bmp] 12 bmp Vt Set:  [510 mL] 510 mL PEEP:  [5 cmH20-10 cmH20] 5 cmH20 Pressure Support:  [16 cmH20] 16 cmH20 Plateau Pressure:  [18 cmH20-24 cmH20] 20 cmH20   Intake/Output Summary (Last 24 hours) at 06/07/2018 0838 Last data filed at 06/07/2018 0600 Gross per 24 hour  Intake 2642.66 ml  Output 3800 ml  Net -1157.34 ml   Filed Weights   06/05/18 0410 06/06/18 0342  06/07/18 0500  Weight: 124.6 kg 124.8 kg 122.8 kg   Examination:   General:  In bed on vent HENT: NCAT ETT in place PULM: CTA B, vent supported breathing CV: RRR, no mgr GI: BS+, soft, nontender MSK: normal bulk and tone Neuro: sedated on vent      Resolved Hospital Problem list   Transaminits.  Septic shock  Assessment & Plan:   Acute Hypoxic Respiratory Failure on mechanical ventilation  -ARDS secondary to  COVID-19 viral pneumonia Likely acute pulmonary edema Plan: Continue full mechanical ventilatory support Daily wake-up assessment and spontaneous breathing trial Ventilator associated pneumonia prevention protocol Family does not want tracheostomy Consider one-way extubation if no improvement over next 3 to 4 days, see discussion from family conversation note on April 18  Baseline HFpEF Hold lasix today  COPD: Continue xopenex, atrovent, pulmicort through vent circuit  Acute toxic metabolic delirium History of stroke Minimize sedation Frequent orientation  Staph epididemis bacteremia > vanc per pharmacy > monitor repeat cultures   Best practice:  Diet:tube feed Pain/Anxiety/Delirium protocol (if indicated): Continuous infusion VAP protocol (if indicated): NA DVT prophylaxis: SCD, Lovenox GI prophylaxis: Protonix Glucose control: sliding scale Mobility: bed bound Code Status: Partial code - NO CPR, Family doesn't want tracheostomy Family Communication: see plan of care note from 4/18 Disposition: ICU   Labs   CBC: Recent Labs  Lab 06/02/18 0500  06/03/18 0435  06/04/18 0400  06/05/18 0500 06/05/18 1440 06/06/18 0235 06/06/18 0440 06/07/18 0310  WBC 7.3  --  9.9  --  8.8  --  8.0  --  7.8  --  8.8  NEUTROABS 5.8  --  8.3*  --  7.1  --  6.2  --  6.0  --   --   HGB 7.4*   < > 8.1*  --  8.0*   < > 8.3* 9.2* 7.6* 8.8* 8.5*  HCT 26.9*   < > 29.3*   < > 28.6*   < > 30.0* 27.0* 26.9* 26.0* 29.7*  MCV 92.1  --  92.1  --  92.3  --  92.6  --  92.4   --  91.7  PLT 264  --  322  --  305  --  314  --  280  --  324   < > = values in this interval not displayed.    Basic Metabolic Panel: Recent Labs  Lab 06/02/18 0500  06/04/18 0400  06/05/18 0500 06/05/18 1440 06/05/18 2350 06/06/18 0235 06/06/18 0440 06/07/18 0310  NA 145   < > 146*   < > 148* 131* 133* 135 147* 141  K 4.3   < > 4.0   < > 2.6* 4.1 3.8 3.8 4.1 3.6  CL 107   < > 108  --  124* 94* 101 101  --  104  CO2 31   < > 31  --  21*  --  26 27  --  28  GLUCOSE 131*   < > 129*  --  89 656* 501* 419*  --  121*  BUN 44*   < > 49*  --  37* 32* 35* 35*  --  34*  CREATININE 0.97   < > 1.04*  --  0.74 0.90 0.99 1.00  --  1.08*  CALCIUM 8.0*   < > 8.1*  --  5.4*  --  7.1* 7.3*  --  7.6*  MG 2.5*  --   --   --  1.6*  --   --  2.4  --  2.5*   < > = values in this interval not displayed.   GFR: Estimated Creatinine Clearance: 69.8 mL/min (A) (by C-G formula based on SCr of 1.08 mg/dL (H)). Recent Labs  Lab 06/01/18 0406  06/04/18 0400 06/05/18 0500 06/06/18 0235 06/07/18 0310  WBC 8.3   < > 8.8 8.0 7.8 8.8  LATICACIDVEN 1.0  --   --   --   --   --    < > = values in this interval not displayed.    Liver Function Tests: Recent Labs  Lab 06/02/18 0500 06/03/18 0435 06/04/18 0400 06/05/18 0500 06/06/18 0235  AST 31 32 31 23 31   ALT 20 19 20 14 17   ALKPHOS 135* 150* 137* 82 109  BILITOT 1.3* 1.2 1.2 0.9 1.1  PROT 6.4* 6.8 6.8 4.7* 6.3*  ALBUMIN 2.3* 2.4* 2.4* 1.7* 2.1*   No results for input(s): LIPASE, AMYLASE in the last 168 hours. No results for input(s): AMMONIA in the last 168 hours.  ABG    Component Value Date/Time   PHART 7.463 (H) 06/06/2018 0440   PCO2ART 40.8 06/06/2018 0440   PO2ART 82.0 (L) 06/06/2018 0440   HCO3 28.9 (H) 06/06/2018 0440   TCO2 30 06/06/2018 0440   O2SAT 96.0 06/06/2018 0440     Coagulation Profile: Recent Labs  Lab 06/04/18 1043  INR 1.4*    Cardiac Enzymes: Recent Labs  Lab 06/01/18 0406  TROPONINI <0.03     HbA1C:  Hgb A1c MFr Bld  Date/Time Value Ref Range Status  03/02/2018 04:43 PM 5.2 4.8 - 5.6 % Final    Comment:             Prediabetes: 5.7 - 6.4          Diabetes: >6.4          Glycemic control for adults with diabetes: <7.0     CBG: Recent Labs  Lab 06/05/18 1954 06/05/18 2304 06/06/18 0301 06/06/18 2344 06/07/18 0351  GLUCAP 134* 111* 139* 128* 125*   My cc time 35 minutes  Roselie Awkward, MD [place] [facility] Pager: [phone] Cell: [phone] If no response, call [phone]

## 2018-06-07 NOTE — Progress Notes (Signed)
Attempted FaceTime video chat with son, Shannon Carroll, and his wife, but the connection was poor so we were unable. We were able to instead audio chat, and Ms. Rappa tracked their voices with her eyes. Family expressed their deep gratitude for Ms. Koren's care.

## 2018-06-08 ENCOUNTER — Ambulatory Visit: Payer: Self-pay | Admitting: Neurology

## 2018-06-08 ENCOUNTER — Inpatient Hospital Stay (HOSPITAL_COMMUNITY): Payer: Medicare Other

## 2018-06-08 ENCOUNTER — Ambulatory Visit: Payer: Medicare Other | Admitting: Neurology

## 2018-06-08 DIAGNOSIS — R6889 Other general symptoms and signs: Secondary | ICD-10-CM

## 2018-06-08 DIAGNOSIS — A419 Sepsis, unspecified organism: Secondary | ICD-10-CM

## 2018-06-08 DIAGNOSIS — G4730 Sleep apnea, unspecified: Secondary | ICD-10-CM

## 2018-06-08 LAB — CBC
HCT: 27.7 % — ABNORMAL LOW (ref 36.0–46.0)
Hemoglobin: 7.6 g/dL — ABNORMAL LOW (ref 12.0–15.0)
MCH: 25.2 pg — ABNORMAL LOW (ref 26.0–34.0)
MCHC: 27.4 g/dL — ABNORMAL LOW (ref 30.0–36.0)
MCV: 91.7 fL (ref 80.0–100.0)
Platelets: 271 10*3/uL (ref 150–400)
RBC: 3.02 MIL/uL — ABNORMAL LOW (ref 3.87–5.11)
RDW: 21.8 % — ABNORMAL HIGH (ref 11.5–15.5)
WBC: 7.1 10*3/uL (ref 4.0–10.5)
nRBC: 0 % (ref 0.0–0.2)

## 2018-06-08 LAB — GLUCOSE, CAPILLARY
Glucose-Capillary: 131 mg/dL — ABNORMAL HIGH (ref 70–99)
Glucose-Capillary: 140 mg/dL — ABNORMAL HIGH (ref 70–99)
Glucose-Capillary: 94 mg/dL (ref 70–99)
Glucose-Capillary: 81 mg/dL (ref 70–99)
Glucose-Capillary: 118 mg/dL — ABNORMAL HIGH (ref 70–99)
Glucose-Capillary: 141 mg/dL — ABNORMAL HIGH (ref 70–99)
Glucose-Capillary: 121 mg/dL — ABNORMAL HIGH (ref 70–99)
Glucose-Capillary: 136 mg/dL — ABNORMAL HIGH (ref 70–99)

## 2018-06-08 LAB — BASIC METABOLIC PANEL
Anion gap: 8 (ref 5–15)
BUN: 29 mg/dL — ABNORMAL HIGH (ref 8–23)
CO2: 28 mmol/L (ref 22–32)
Calcium: 7.5 mg/dL — ABNORMAL LOW (ref 8.9–10.3)
Chloride: 108 mmol/L (ref 98–111)
Creatinine, Ser: 0.93 mg/dL (ref 0.44–1.00)
GFR calc Af Amer: >60 mL/min (ref >60)
GFR calc non Af Amer: >60 mL/min (ref >60)
Glucose, Bld: 97 mg/dL (ref 70–99)
Potassium: 3.8 mmol/L (ref 3.5–5.1)
Sodium: 144 mmol/L (ref 135–145)

## 2018-06-08 LAB — POCT I-STAT 7, (LYTES, BLD GAS, ICA,H+H)
Acid-Base Excess: 2 mmol/L (ref 0.0–2.0)
Bicarbonate: 26.4 mmol/L (ref 20.0–28.0)
Calcium, Ion: 1.13 mmol/L — ABNORMAL LOW (ref 1.15–1.40)
HCT: 26 % — ABNORMAL LOW (ref 36.0–46.0)
Hemoglobin: 8.8 g/dL — ABNORMAL LOW (ref 12.0–15.0)
O2 Saturation: 92 %
Patient temperature: 102
Potassium: 3.6 mmol/L (ref 3.5–5.1)
Sodium: 146 mmol/L — ABNORMAL HIGH (ref 135–145)
TCO2: 28 mmol/L (ref 22–32)
pCO2 arterial: 44.2 mmHg (ref 32.0–48.0)
pH, Arterial: 7.392 (ref 7.350–7.450)
pO2, Arterial: 71 mmHg — ABNORMAL LOW (ref 83.0–108.0)

## 2018-06-08 LAB — RPR: RPR Ser Ql: NONREACTIVE

## 2018-06-08 LAB — MAGNESIUM: Magnesium: 2.5 mg/dL — ABNORMAL HIGH (ref 1.7–2.4)

## 2018-06-08 MED ORDER — SODIUM CHLORIDE 0.9 % IV SOLN
3.0000 g | Freq: Once | INTRAVENOUS | Status: AC
  Administered 2018-06-08: 21:00:00 3 g via INTRAVENOUS
  Filled 2018-06-08: qty 3

## 2018-06-08 MED ORDER — FENTANYL 100 MCG/HR TD PT72
1.0000 | MEDICATED_PATCH | TRANSDERMAL | Status: DC
  Administered 2018-06-08 – 2018-06-14 (×3): 1 via TRANSDERMAL
  Filled 2018-06-08 (×3): qty 1

## 2018-06-08 MED ORDER — ATORVASTATIN CALCIUM 40 MG PO TABS
40.0000 mg | ORAL_TABLET | Freq: Every day | ORAL | Status: DC
  Filled 2018-06-08: qty 1

## 2018-06-08 MED ORDER — DEXMEDETOMIDINE HCL IN NACL 400 MCG/100ML IV SOLN
0.0000 ug/kg/h | INTRAVENOUS | Status: AC
  Administered 2018-06-08 – 2018-06-09 (×5): 1.2 ug/kg/h via INTRAVENOUS
  Administered 2018-06-09: 0.2 ug/kg/h via INTRAVENOUS
  Filled 2018-06-08 (×6): qty 100

## 2018-06-08 MED ORDER — SODIUM CHLORIDE 0.9 % IV SOLN
3.0000 g | Freq: Four times a day (QID) | INTRAVENOUS | Status: AC
  Administered 2018-06-08 – 2018-06-13 (×21): 3 g via INTRAVENOUS
  Filled 2018-06-08 (×23): qty 3

## 2018-06-08 MED ORDER — ATORVASTATIN CALCIUM 10 MG PO TABS: 20.0000 mg | ORAL_TABLET | Freq: Once | ORAL | Status: DC

## 2018-06-08 MED ORDER — ALTEPLASE 2 MG IJ SOLR
2.0000 mg | Freq: Once | INTRAMUSCULAR | Status: DC
  Filled 2018-06-08: qty 2

## 2018-06-08 MED ORDER — SODIUM CHLORIDE 0.9 % IV SOLN
INTRAVENOUS | Status: DC | PRN
  Administered 2018-06-08 – 2018-06-14 (×2): via INTRAVENOUS

## 2018-06-08 MED ORDER — ATORVASTATIN CALCIUM 40 MG PO TABS
40.0000 mg | ORAL_TABLET | Freq: Every day | ORAL | Status: DC
  Administered 2018-06-10 – 2018-06-14 (×5): 40 mg
  Filled 2018-06-08: qty 1
  Filled 2018-06-08: qty 4
  Filled 2018-06-08 (×2): qty 1

## 2018-06-08 MED ORDER — ATORVASTATIN CALCIUM 10 MG PO TABS
20.0000 mg | ORAL_TABLET | Freq: Once | ORAL | Status: AC
  Administered 2018-06-08: 18:00:00 20 mg

## 2018-06-08 NOTE — Progress Notes (Signed)
Patient desated due to possible aspiration, FIO2 increased to 100%.  Dr. Philis Pique asked for peep to be increased to 10, RR increased to 32, VT dropped to 6 cc's PBW, FIO2 tirated to 90% with a sat goal of 88-92%.  RN at bedside.  RT will continue to monitor.

## 2018-06-08 NOTE — Progress Notes (Signed)
Patient was to be extubated one way today.  Patient suffered an aspiration event and no longer tolerated wean.  Went back into ARDS.  CXR ordered and confirmed worsening infiltrate likely aspiration.  Vent adjusted to accommodate PEEP and plateau.  ABG ordered.  Sedation with precedex drip, versed pushes and fentanyl pushes and fentanyl patch.  Trying very much to avoid paralytic.  Will maintain as DNR for now, when ready for extubation will extubate one way only.  If deteriorates further will need to communicate with family regarding progression to comfort care.  Rush Farmer, M.D. [facility] Pulmonary/Critical Care Medicine. Pager: [phone]. After hours pager: [phone].

## 2018-06-08 NOTE — Progress Notes (Signed)
PT Cancellation Note  Patient Details Name: Shannon Carroll MRN: [id number] DOB: [birth year]   Cancelled Treatment:    Reason Eval/Treat Not Completed: Medical issues which prohibited therapy.  Pt had a possible aspiration event today.  Vent settings had to be turned up (see RT note).  I checked in with RN to confirm pt was not appropriate for PT today.  PT to follow along.    Thanks,  [name]. Jerremy Maione, PT, DPT  Acute Rehabilitation [phone] pager [phone]) [phone] office     [name] Mitra Duling 06/08/2018, 5:07 PM

## 2018-06-08 NOTE — Progress Notes (Signed)
Shannon Carroll JSH:[id number] DOB: 08/23/1950 DOA: 05/21/2018 PCP: Harlan Stains, MD   Subj: Contacted by RN and informed that patient had aspirated when her core track tube was partially coughed up.    Obj: Objective: VITAL SIGNS: Temp: 97.9 F (36.6 C) (04/20 0800) Temp Source: Axillary (04/20 0800) BP: 107/91 (04/20 1130) Pulse Rate: 90 (04/20 1130) SPO2; FIO2:   Intake/Output Summary (Last 24 hours) at 06/08/2018 1526 Last data filed at 06/08/2018 1159 Gross per 24 hour  Intake 2641.79 ml  Output 830 ml  Net 1811.79 ml     Exam: Evaluated by camera secondary to patient being COVID positive.  Tachypneic, dyssynchronous breathing with vent, diaphoretic.    Ventilator Mode: PRVC  Vt Set: 380 Set rate: 32 FiO2: 90% PEEP: 10      Procedure/Significant Events: 4/20: Aspiration   I have personally reviewed and interpreted all radiology studies and my findings are as above.   Culture    Antibiotics: Anti-infectives (From admission, onward)   Start     Stop   06/08/18 2000  Ampicillin-Sulbactam (UNASYN) 3 g in sodium chloride 0.9 % 100 mL IVPB         06/08/18 1600  Ampicillin-Sulbactam (UNASYN) 3 g in sodium chloride 0.9 % 100 mL IVPB         06/07/18 1500  vancomycin (VANCOCIN) 1,500 mg in sodium chloride 0.9 % 500 mL IVPB         06/06/18 1500  vancomycin (VANCOCIN) 2,000 mg in sodium chloride 0.9 % 500 mL IVPB     06/06/18 1700   05/27/18 2200  ceFEPIme (MAXIPIME) 2 g in sodium chloride 0.9 % 100 mL IVPB  Status:  Discontinued     05/27/18 1400   05/27/18 1200  ceFEPIme (MAXIPIME) 2 g in sodium chloride 0.9 % 100 mL IVPB     05/27/18 1330   05/26/18 2200  azithromycin (ZITHROMAX) tablet 500 mg  Status:  Discontinued     05/26/18 1502   05/26/18 2200  azithromycin (ZITHROMAX) tablet 500 mg     05/27/18 2141   05/24/18 2200  hydroxychloroquine (PLAQUENIL) tablet 200 mg     05/28/18 1025   05/23/18 2300  azithromycin (ZITHROMAX) 500 mg in sodium  chloride 0.9 % 250 mL IVPB  Status:  Discontinued     05/26/18 0715   05/23/18 2200  hydroxychloroquine (PLAQUENIL) tablet 400 mg     05/24/18 1114   05/23/18 1500  vancomycin (VANCOCIN) 1,250 mg in sodium chloride 0.9 % 250 mL IVPB  Status:  Discontinued     05/25/18 1141   05/23/18 0230  ceFEPIme (MAXIPIME) 2 g in sodium chloride 0.9 % 100 mL IVPB  Status:  Discontinued     05/25/18 1334   06/03/2018 1545  metroNIDAZOLE (FLAGYL) IVPB 500 mg     06/01/2018 1731   06/11/2018 1530  vancomycin (VANCOCIN) IVPB 1000 mg/200 mL premix     06/14/2018 1731   05/20/2018 1415  vancomycin (VANCOCIN) IVPB 1000 mg/200 mL premix     05/21/2018 1538   06/16/2018 1415  ceFEPIme (MAXIPIME) 2 g in sodium chloride 0.9 % 100 mL IVPB     06/14/2018 1509      A/P Aspiration pneumonitis/pneumonia: - PCXR showed clearly that patient had aspirated left lingula, RLL patchy opacification significantly worse than previous 4/18 PCXR.  - Adjustments made to vent to maintain SPO2 88 - 92% see above - Antibiotics started for aspiration pneumonia see above  Care during the described time interval was provided by me .  I have reviewed this patient's available data, including medical history, events of note, physical examination, and all test results as part of my evaluation.

## 2018-06-08 NOTE — Progress Notes (Signed)
Facetime call was done with the son Mitzi Hansen). Family was very supportive and expressed their appreciation for giving her excellent care.

## 2018-06-08 NOTE — Progress Notes (Signed)
PROGRESS NOTE    Shannon Carroll  VVO:[id number] DOB: 1950-03-27 DOA: 05/20/2018 PCP: Harlan Stains, MD   Brief Narrative:  50 yr WF PMHx Chronic Diastolic (EF 26%), COPD, CVA, SVT, essential hypertension, obstructive sleep apnea, morbid obesity,   Presented to the hospital with acute shortness of breath was diagnosed with COVID-19 infection, went into acute hypoxic respiratory failure intubated and kept in ICU by pulmonary critical care. Transferred to [facility] covered ICU unit on 06/01/2018 under my care intubated.     Subjective: Patient is alert, intubated, follows some commands.   Assessment & Plan:   Principal Problem:   Multifocal pneumonia Active Problems:   Sleep apnea   Shortness of breath   Acute on chronic diastolic CHF (congestive heart failure) (HCC)   Hypertension   Stroke (HCC)   CKD (chronic kidney disease), stage III (HCC)   Hypokalemia   Abnormal LFTs   Acute respiratory failure (HCC)   Elevated troponin   SVT (supraventricular tachycardia) (HCC)   Acute on chronic respiratory failure (HCC)   Dementia, vascular (HCC)   ARDS (adult respiratory distress syndrome) ([place])   COVID-19 virus infection   Acute respiratory failure with hypoxia/COVID-19 pneumonia -Currently intubated but did very well on pressure support today most likely ready for extubation. -PCCM managing - Completed treatment with hydroxychloroquine and azithromycin  Septic shock - Patient had been on low-dose Levophed currently off.  She had become hypotensive after being given a dose of Lasix   Staph Epidermidis Bacteremia  -Secondary to contaminated PICC line?  Case discussed with Dr. Prince Rome from ID who recommended treating with vancomycin through PICC line and repeating blood cultures.  If blood cultures negative then could leave PICC line in if positive PICC line will need to be removed -Blood cultures positive for staph epidermidis 2/2  Acute renal failure -Resolved -  Monitor creatinine level Recent Labs  Lab 06/05/18 1440 06/05/18 2350 06/06/18 0235 06/07/18 0310 06/08/18 0310  CREATININE 0.90 0.99 1.00 1.08* 0.93    Iron deficiency anemia, acute on chronic -Work blood loss - Hemoglobin stable Recent Labs  Lab 06/05/18 1440 06/06/18 0235 06/06/18 0440 06/07/18 0310 06/08/18 0310  HGB 9.2* 7.6* 8.8* 8.5* 7.6*    Chronic diastolic CHF -Strict in and out -Daily weight.    SVT -Short burst SVT duringCPAP on 4/14 resolved with beta-blockers continue as needed -Recent TTE shows EF of 65% with no WMA   Hypokalemia -Potassium goal > 4  Hypomagnesmia -Magnesium goal > 2    Morbid obesity  OSA -Currently vented  Diabetes type 2 controlled with complication - 9/48 hemoglobin A1c= 5.2  HLD - LDL not within AHA/ADA guidelines - 4/20 increase Lipitor to 40 mg daily  Nutrition -Continue tube feeds   Hx CVA - Plavix - ASA 81 mg    Acute metabolic encephalopathy -Underlying dementia - Recent CVA x2  -Encephalopathy appears to be resolving. - Trial extubation today      Code Status :  Partial with No CPR Disposition Plan  :  ICU    DVT prophylaxis:  Code Status: DNR Family Communication: PCCM spoke with family Disposition Plan: TBD   Consultants:  PCCM  Procedures/Significant Events:     I have personally reviewed and interpreted all radiology studies and my findings are as above.  VENTILATOR SETTINGS: Mode: PRVC Vt set 510 Set rate: 12 FiO2 30% PE 5    Cultures 4/3 blood RIGHT hand negative x2 4/5 MRSA by PCR negative 4/7 tracheal aspirate  normal flora 4/15 urine negative 4/15 blood positive STAPHYLOCOCCUS EPIDERMIDIS 2/2 4/18 blood caps right NGTD 4/18 blood A-line NGTD   Antimicrobials: Anti-infectives (From admission, onward)   Start     Stop   06/07/18 1500  vancomycin (VANCOCIN) 1,500 mg in sodium chloride 0.9 % 500 mL IVPB         06/06/18 1500  vancomycin (VANCOCIN) 2,000 mg in  sodium chloride 0.9 % 500 mL IVPB     06/06/18 1700   05/27/18 2200  ceFEPIme (MAXIPIME) 2 g in sodium chloride 0.9 % 100 mL IVPB  Status:  Discontinued     05/27/18 1400   05/27/18 1200  ceFEPIme (MAXIPIME) 2 g in sodium chloride 0.9 % 100 mL IVPB     05/27/18 1330   05/26/18 2200  azithromycin (ZITHROMAX) tablet 500 mg  Status:  Discontinued     05/26/18 1502   05/26/18 2200  azithromycin (ZITHROMAX) tablet 500 mg     05/27/18 2141   05/24/18 2200  hydroxychloroquine (PLAQUENIL) tablet 200 mg     05/28/18 1025   05/23/18 2300  azithromycin (ZITHROMAX) 500 mg in sodium chloride 0.9 % 250 mL IVPB  Status:  Discontinued     05/26/18 0715   05/23/18 2200  hydroxychloroquine (PLAQUENIL) tablet 400 mg     05/24/18 1114   05/23/18 1500  vancomycin (VANCOCIN) 1,250 mg in sodium chloride 0.9 % 250 mL IVPB  Status:  Discontinued     05/25/18 1141   05/23/18 0230  ceFEPIme (MAXIPIME) 2 g in sodium chloride 0.9 % 100 mL IVPB  Status:  Discontinued     05/25/18 1334   06/17/2018 1545  metroNIDAZOLE (FLAGYL) IVPB 500 mg     05/31/2018 1731   06/01/2018 1530  vancomycin (VANCOCIN) IVPB 1000 mg/200 mL premix     05/25/2018 1731   06/10/2018 1415  vancomycin (VANCOCIN) IVPB 1000 mg/200 mL premix     05/31/2018 1538   06/07/2018 1415  ceFEPIme (MAXIPIME) 2 g in sodium chloride 0.9 % 100 mL IVPB     06/11/2018 1509       Devices #7.5 ETT 4/4>>>   LINES / TUBES:      Continuous Infusions: . dexmedetomidine (PRECEDEX) IV infusion Stopped (06/02/18 0800)  . lactated ringers 50 mL/hr at 06/07/18 2000  . norepinephrine (LEVOPHED) Adult infusion 2 mcg/min (06/07/18 2300)  . vancomycin 1,500 mg (06/07/18 1400)     Objective: Vitals:   06/08/18 0438 06/08/18 0500 06/08/18 0600 06/08/18 0700  BP:  101/75 94/77 105/89  Pulse:  85 84 90  Resp:  (!) 22 20 (!) 29  Temp:      TempSrc:      SpO2:  97% 99% 98%  Weight: 125.2 kg     Height:        Intake/Output Summary (Last 24 hours) at 06/08/2018 0803  Last data filed at 06/08/2018 0600 Gross per 24 hour  Intake 2746.47 ml  Output 1055 ml  Net 1691.47 ml   Filed Weights   06/06/18 0342 06/07/18 0500 06/08/18 0438  Weight: 124.8 kg 122.8 kg 125.2 kg    Examination:  General: Alert, follows some commands, positive acute respiratory distress Eyes: negative scleral hemorrhage, negative anisocoria, negative icterus ENT: Negative Runny nose, negative gingival bleeding, #7.5 ETT in place negative sign of infection, Neck:  Negative scars, masses, torticollis, lymphadenopathy, JVD Lungs: Coarse breath sounds diffusely, without wheezes or crackles Cardiovascular: Regular rate and rhythm without murmur gallop or rub normal S1 and  S2 Abdomen: Morbidly obese, negative abdominal pain, nondistended, positive soft, bowel sounds, no rebound, no ascites, no appreciable mass Extremities: No significant cyanosis, clubbing, or edema bilateral lower extremities Skin: Negative rashes, lesions, ulcers Psychiatric: Could not fully assess   Central nervous system: Could not fully assess but patient moved extremities to command.  .     Data Reviewed: Care during the described time interval was provided by me .  I have reviewed this patient's available data, including medical history, events of note, physical examination, and all test results as part of my evaluation.   CBC: Recent Labs  Lab 06/02/18 0500  06/03/18 0435  06/04/18 0400  06/05/18 0500 06/05/18 1440 06/06/18 0235 06/06/18 0440 06/07/18 0310 06/08/18 0310  WBC 7.3  --  9.9  --  8.8  --  8.0  --  7.8  --  8.8 7.1  NEUTROABS 5.8  --  8.3*  --  7.1  --  6.2  --  6.0  --   --   --   HGB 7.4*   < > 8.1*  --  8.0*   < > 8.3* 9.2* 7.6* 8.8* 8.5* 7.6*  HCT 26.9*   < > 29.3*   < > 28.6*   < > 30.0* 27.0* 26.9* 26.0* 29.7* 27.7*  MCV 92.1  --  92.1  --  92.3  --  92.6  --  92.4  --  91.7 91.7  PLT 264  --  322  --  305  --  314  --  280  --  324 271   < > = values in this interval not  displayed.   Basic Metabolic Panel: Recent Labs  Lab 06/02/18 0500  06/05/18 0500 06/05/18 1440 06/05/18 2350 06/06/18 0235 06/06/18 0440 06/07/18 0310 06/08/18 0310  NA 145   < > 148* 131* 133* 135 147* 141 144  K 4.3   < > 2.6* 4.1 3.8 3.8 4.1 3.6 3.8  CL 107   < > 124* 94* 101 101  --  104 108  CO2 31   < > 21*  --  26 27  --  28 28  GLUCOSE 131*   < > 89 656* 501* 419*  --  121* 97  BUN 44*   < > 37* 32* 35* 35*  --  34* 29*  CREATININE 0.97   < > 0.74 0.90 0.99 1.00  --  1.08* 0.93  CALCIUM 8.0*   < > 5.4*  --  7.1* 7.3*  --  7.6* 7.5*  MG 2.5*  --  1.6*  --   --  2.4  --  2.5* 2.5*   < > = values in this interval not displayed.   GFR: Estimated Creatinine Clearance: 81.9 mL/min (by C-G formula based on SCr of 0.93 mg/dL). Liver Function Tests: Recent Labs  Lab 06/02/18 0500 06/03/18 0435 06/04/18 0400 06/05/18 0500 06/06/18 0235  AST 31 32 _0 ALT _1 ALKPHOS 135* 150* 137* 82 109  BILITOT 1.3* 1.2 1.2 0.9 1.1  PROT 6.4* 6.8 6.8 4.7* 6.3*  ALBUMIN 2.3* 2.4* 2.4* 1.7* 2.1*   No results for input(s): LIPASE, AMYLASE in the last 168 hours. No results for input(s): AMMONIA in the last 168 hours. Coagulation Profile: Recent Labs  Lab 06/04/18 1043  INR 1.4*   Cardiac Enzymes: No results for input(s): CKTOTAL, CKMB, CKMBINDEX, TROPONINI in the last 168 hours. BNP (last 3 results) No results  for input(s): PROBNP in the last 8760 hours. HbA1C: No results for input(s): HGBA1C in the last 72 hours. CBG: Recent Labs  Lab 06/07/18 1817 06/07/18 1959 06/07/18 2338 06/08/18 0341 06/08/18 0741  GLUCAP 129* 137* 135* 94 81   Lipid Profile: No results for input(s): CHOL, HDL, LDLCALC, TRIG, CHOLHDL, LDLDIRECT in the last 72 hours. Thyroid Function Tests: No results for input(s): TSH, T4TOTAL, FREET4, T3FREE, THYROIDAB in the last 72 hours. Anemia Panel: No results for input(s): VITAMINB12, FOLATE, FERRITIN, TIBC, IRON, RETICCTPCT in the  last 72 hours. Urine analysis:    Component Value Date/Time   COLORURINE YELLOW 06/03/2018 1347   APPEARANCEUR CLEAR 06/03/2018 1347   APPEARANCEUR Cloudy (A) 03/04/2018 1123   LABSPEC 1.019 06/03/2018 1347   PHURINE 8.0 06/03/2018 1347   GLUCOSEU NEGATIVE 06/03/2018 1347   HGBUR NEGATIVE 06/03/2018 1347   BILIRUBINUR NEGATIVE 06/03/2018 1347   BILIRUBINUR Negative 03/04/2018 1123   KETONESUR NEGATIVE 06/03/2018 1347   PROTEINUR 30 (A) 06/03/2018 1347   NITRITE NEGATIVE 06/03/2018 1347   LEUKOCYTESUR NEGATIVE 06/03/2018 1347   Sepsis Labs: _0 (procalcitonin:4,lacticidven:4)  ) Recent Results (from the past 240 hour(s))  Culture, Urine     Status: None   Collection Time: 06/03/18  1:48 PM  Result Value Ref Range Status   Specimen Description   Final    URINE, CATHETERIZED Performed at [facility], [place] [address], [place], [place] [address]    Special Requests   Final    NONE Performed at [facility], [place] [address], [place], [place] [address]    Culture   Final    NO GROWTH Performed at [facility], [place] [address], [place], [place] [address]    Report Status 06/04/2018 FINAL  Final  Culture, blood (routine x 2)     Status: Abnormal   Collection Time: 06/03/18  6:15 PM  Result Value Ref Range Status   Specimen Description   Final    BLOOD BLOOD RIGHT HAND Performed at [facility] [address], [place], [place] [address]    Special Requests   Final    BOTTLES DRAWN AEROBIC AND ANAEROBIC Blood Culture adequate volume Performed at [facility] [address] [place], [place] [address]    Culture  Setup Time   Final    ANAEROBIC BOTTLE ONLY GRAM POSITIVE COCCI CRITICAL RESULT CALLED TO, READ BACK BY AND VERIFIED WITH: J.MIZE,RN AT 0022 ON 06/05/18 BY G.MCADOO Performed at [facility], [place] [address] [place], [place] [address]    Culture STAPHYLOCOCCUS EPIDERMIDIS  (A)  Final   Report Status 06/06/2018 FINAL  Final   Organism ID, Bacteria STAPHYLOCOCCUS EPIDERMIDIS  Final      Susceptibility   Staphylococcus epidermidis - MIC*    CIPROFLOXACIN <=0.5 SENSITIVE Sensitive     ERYTHROMYCIN >=8 RESISTANT Resistant     GENTAMICIN <=0.5 SENSITIVE Sensitive     OXACILLIN >=4 RESISTANT Resistant     TETRACYCLINE >=16 RESISTANT Resistant     VANCOMYCIN 2 SENSITIVE Sensitive     TRIMETH/SULFA <=10 SENSITIVE Sensitive     CLINDAMYCIN <=0.25 SENSITIVE Sensitive     RIFAMPIN <=0.5 SENSITIVE Sensitive     Inducible Clindamycin NEGATIVE Sensitive     * STAPHYLOCOCCUS EPIDERMIDIS  Blood Culture ID Panel (Reflexed)     Status: Abnormal   Collection Time: 06/03/18  6:15 PM  Result Value Ref Range Status   Enterococcus species NOT DETECTED NOT DETECTED Final  Listeria monocytogenes NOT DETECTED NOT DETECTED Final   Staphylococcus species DETECTED (A) NOT DETECTED Final    Comment: Methicillin (oxacillin) resistant coagulase negative staphylococcus. Possible blood culture contaminant (unless isolated from more than one blood culture draw or clinical case suggests pathogenicity). No antibiotic treatment is indicated for blood  culture contaminants. CRITICAL RESULT CALLED TO, READ BACK BY AND VERIFIED WITH: J.MIZE,RN AT 0022 ON 06/05/18 BY G.MCADOO    Staphylococcus aureus (BCID) NOT DETECTED NOT DETECTED Final   Methicillin resistance DETECTED (A) NOT DETECTED Final    Comment: CRITICAL RESULT CALLED TO, READ BACK BY AND VERIFIED WITH: J.MIZE,RN AT 0022 ON 06/05/18 BY G.MCADOO    Streptococcus species NOT DETECTED NOT DETECTED Final   Streptococcus agalactiae NOT DETECTED NOT DETECTED Final   Streptococcus pneumoniae NOT DETECTED NOT DETECTED Final   Streptococcus pyogenes NOT DETECTED NOT DETECTED Final   Acinetobacter baumannii NOT DETECTED NOT DETECTED Final   Enterobacteriaceae species NOT DETECTED NOT DETECTED Final   Enterobacter cloacae complex NOT  DETECTED NOT DETECTED Final   Escherichia coli NOT DETECTED NOT DETECTED Final   Klebsiella oxytoca NOT DETECTED NOT DETECTED Final   Klebsiella pneumoniae NOT DETECTED NOT DETECTED Final   Proteus species NOT DETECTED NOT DETECTED Final   Serratia marcescens NOT DETECTED NOT DETECTED Final   Haemophilus influenzae NOT DETECTED NOT DETECTED Final   Neisseria meningitidis NOT DETECTED NOT DETECTED Final   Pseudomonas aeruginosa NOT DETECTED NOT DETECTED Final   Candida albicans NOT DETECTED NOT DETECTED Final   Candida glabrata NOT DETECTED NOT DETECTED Final   Candida krusei NOT DETECTED NOT DETECTED Final   Candida parapsilosis NOT DETECTED NOT DETECTED Final   Candida tropicalis NOT DETECTED NOT DETECTED Final    Comment: Performed at [facility], [place] [address], [place], [place] [address]  Culture, blood (routine x 2)     Status: Abnormal   Collection Time: 06/03/18  6:30 PM  Result Value Ref Range Status   Specimen Description   Final    BLOOD BLOOD RIGHT WRIST Performed at [facility] [address], [place], [place] [address]    Special Requests   Final    BOTTLES DRAWN AEROBIC ONLY Blood Culture adequate volume Performed at [facility] [address] [place], [place] [address]    Culture  Setup Time   Final    AEROBIC BOTTLE ONLY GRAM POSITIVE COCCI CRITICAL VALUE NOTED.  VALUE IS CONSISTENT WITH PREVIOUSLY REPORTED AND CALLED VALUE.    Culture (A)  Final    STAPHYLOCOCCUS EPIDERMIDIS SUSCEPTIBILITIES PERFORMED ON PREVIOUS CULTURE WITHIN THE LAST 5 DAYS. Performed at [facility], [place] [address] [place], [place] [address]    Report Status 06/07/2018 FINAL  Final  Culture, blood (Routine X 2) w Reflex to ID Panel     Status: None (Preliminary result)   Collection Time: 06/06/18  1:43 PM  Result Value Ref Range Status   Specimen Description   Final    BLOOD RIGHT HAND Performed at [facility] [address] [place], [place] [address]    Special Requests   Final    BOTTLES DRAWN AEROBIC ONLY Blood Culture results may not be optimal due to an inadequate volume of blood received in culture bottles Performed at [facility] [address], [place], [place] [address]    Culture   Final    NO GROWTH < 24 HOURS Performed at [facility],  [address] [place], [place] [address]    Report Status PENDING  Incomplete  Culture, blood (Routine X 2) w Reflex to ID Panel     Status: None (Preliminary result)   Collection Time: 06/06/18  1:48 PM  Result Value Ref Range Status   Specimen Description   Final    BLOOD A-LINE Performed at [facility], [place] [address], [place], [place] [address]    Special Requests   Final    BOTTLES DRAWN AEROBIC ONLY Blood Culture adequate volume Performed at [facility] [address], [place], [place] [address]    Culture   Final    NO GROWTH < 24 HOURS Performed at [facility] [address] [place],  [address]    Report Status PENDING  Incomplete         Radiology Studies: Dg Chest Port 1 View  Result Date: 06/06/2018 CLINICAL DATA:  Acute respiratory failure EXAM: PORTABLE CHEST 1 VIEW COMPARISON:  06/04/2018 and prior radiographs FINDINGS: Cardiomediastinal silhouette is unchanged. An endotracheal tube, LEFT PICC line and NG tube are unchanged. Bilateral airspace opacities are stable from the prior study. Bilateral pleural effusions again noted.  No pneumothorax. IMPRESSION: Unchanged appearance of the chest with bilateral airspace opacities and bilateral pleural effusions. Electronically Signed   By: Margarette Canada M.D.   On: 06/06/2018 13:21        Scheduled Meds: . atorvastatin  20 mg Per Tube q1800  . budesonide (PULMICORT) nebulizer solution  0.25 mg Nebulization BID  . chlorhexidine gluconate (MEDLINE KIT)  15 mL Mouth Rinse BID  . Chlorhexidine Gluconate Cloth   6 each Topical Daily  . clopidogrel  75 mg Per Tube Daily  . docusate  50 mg Oral BID  . enoxaparin (LOVENOX) injection  60 mg Subcutaneous Q24H  . feeding supplement (JEVITY 1.2 CAL)  1,000 mL Per Tube Q24H  . feeding supplement (PRO-STAT SUGAR FREE 64)  30 mL Per Tube TID WC  . free water  200 mL Per Tube Q6H  . insulin aspart  0-15 Units Subcutaneous Q4H  . mouth rinse  15 mL Mouth Rinse 10 times per day  . metoprolol tartrate  12.5 mg Per Tube BID  . pantoprazole sodium  40 mg Per Tube Daily  . sennosides  5 mL Oral QHS  . silver sulfADIAZINE  1 application Topical Daily  . sodium chloride flush  10-40 mL Intracatheter Q12H  . sodium chloride flush  3 mL Intravenous Q12H  . venlafaxine  37.5 mg Per Tube QHS  . venlafaxine  75 mg Per Tube q morning - 10a   Continuous Infusions: . dexmedetomidine (PRECEDEX) IV infusion Stopped (06/02/18 0800)  . lactated ringers 50 mL/hr at 06/07/18 2000  . norepinephrine (LEVOPHED) Adult infusion 2 mcg/min (06/07/18 2300)  . vancomycin 1,500 mg (06/07/18 1400)     LOS: 17 days   The patient is critically ill with multiple organ systems failure and requires high complexity decision making for assessment and support, frequent evaluation and titration of therapies, application of advanced monitoring technologies and extensive interpretation of multiple databases. Critical Care Time devoted to patient care services described in this note  Time spent: 40 minutes     Rynlee Lisbon, Geraldo Docker, MD [facility] Pager [phone]  If 7PM-7AM, please contact night-coverage [email or web address] Password [email or web address] 06/08/2018, 8:03 AM

## 2018-06-08 NOTE — Progress Notes (Signed)
Pharmacy Antibiotic Note  Shannon Carroll is a 68 y.o. female admitted on 06/02/2018 with COVID 19 infection. Patient was febrile overnight but remained afebrile since. Blood cultures are positive for MRSE. ID is recommending treatment with vancomycin and removal of PICC.   She has been on D2 vanc for her CNS line infection. Dr Nelda Marseille added Unasyn this PM for asp PNA. Scr<1, WBC 7.1   Plan: Vancomycin 1500 mg IV Q 24 hrs. Goal AUC 400-550. Expected AUC: 480 SCr used: 1 Unasyn  3g IV q6  Height: 5\' 8"  (172.7 cm) Weight: 276 lb 0.3 oz (125.2 kg) IBW/kg (Calculated) : 63.9  Temp (24hrs), Avg:98.5 F (36.9 C), Min:97.5 F (36.4 C), Max:100 F (37.8 C)  Recent Labs  Lab 06/04/18 0400 06/05/18 0500 06/05/18 1440 06/05/18 2350 06/06/18 0235 06/07/18 0310 06/08/18 0310  WBC 8.8 8.0  --   --  7.8 8.8 7.1  CREATININE 1.04* 0.74 0.90 0.99 1.00 1.08* 0.93    Estimated Creatinine Clearance: 81.9 mL/min (by C-G formula based on SCr of 0.93 mg/dL).    Allergies  Allergen Reactions  . Lisinopril Cough    4/4 Hydroxy>> (4/9)  ---zinc (4/9) 4/4 Azithro >> (4/8) 4/3 Vanc >>4/6, 4/18 >> 4/3 Cefepime >>4/6, restarted 4/8>>4/9 4/3 Flagyl x 1  4/20 unasyn>>  4/5 mrsa pcr: neg 4/3 blood cx: neg 4/3 Covid: positive  4/7 Resp asp: G+ cocci and G-rods; normal flora  4/15 UCx: neg  4/15 BCx 2/4 staph epi (MRSE on BCID; 1/2 from each set)   4/18 BCx:   Onnie Boer, PharmD, BCIDP, AAHIVP, CPP Infectious Disease Pharmacist 06/08/2018 3:31 PM

## 2018-06-08 NOTE — Progress Notes (Signed)
NAME:  Shannon Carroll, MRN:  [id number], DOB:  30-Apr-1950, LOS: 11 ADMISSION DATE:  06/10/2018, CONSULTATION DATE:  05/23/2018 REFERRING MD:  Glenna Durand, CHIEF COMPLAINT:  SVT hpoxemia, COVID 30 POS   Brief History   68 yr old female with PMHx HFpEF, COPD, w/ bilateral infiltrates COVID positive on supplemental oxygen 8L with increased work of breathing, fatigue, and diaphoretic. PCCM consulted for acute respiratory distress.  Significant Hospital Events   4/3 + COVID -19  4/4 ICU admit intubated  4/12 weaning trial, failed, de-sat, SVT  4/15 episodes of SVT  Consults:  PCCM   Procedures:  Endotracheal intubation- 05/23/2018  Significant Diagnostic Tests:  Bilateral alveolar infiltrates on CXR -CXR 4/8 with worsening LLL and RML opacity  4/18 CT head > stable R frontal infarct, NAICP  Micro Data:  Blood cx 05/31/2018- NGTD Novel Coronavirus Positive result called 4/4. Tracheal aspirate 4/7>>> moderate WBC (abundant GPC and GNR)   4/15 blood > staph epidermidis 2/4 bottles 4/15 urine > neg 4/18 blood >    Antimicrobials:  Vancomycin- 05/23/2018 Cefepime- 05/23/2018-4/6, 4/8>>  Hydroxychloroquine- upon transfer to ICU -COVID (to end 4/9) Axithromycin - upon transfer to ICU- COVID (to end 4/8)  4/18 vancomycin  Interim history/subjective:   More interactive this AM Weaning well this AM  Objective   Blood pressure 108/87, pulse 100, temperature (!) 97.5 F (36.4 C), temperature source Oral, resp. rate 18, height 5\' 8"  (1.727 m), weight 125.2 kg, SpO2 97 %.    Vent Mode: PRVC FiO2 (%):  [30 %] 30 % Set Rate:  [12 bmp] 12 bmp Vt Set:  [510 mL] 510 mL PEEP:  [5 cmH20] 5 cmH20 Pressure Support:  [8 cmH20] 8 cmH20 Plateau Pressure:  [14 cmH20-23 cmH20] 18 cmH20   Intake/Output Summary (Last 24 hours) at 06/08/2018 0933 Last data filed at 06/08/2018 0600 Gross per 24 hour  Intake 2607.74 ml  Output 905 ml  Net 1702.74 ml   Filed Weights   06/06/18 0342 06/07/18 0500 06/08/18  0438  Weight: 124.8 kg 122.8 kg 125.2 kg   Examination:   General:  Chronically ill appearing female, NAD HENT: [place]/AT, PERRL, EOM-I and ETT in place PULM: CTA bilaterally CV: RRR, Nl S1/S2 and -M/R/G GI: Soft, NT, ND and +BS MSK: normal bulk and tone Neuro: Awake and interactive, moving all ext to command  I reviewed CXR myself, ETT is in a good position  Resolved Hospital Problem list   Transaminits.  Septic shock  Assessment & Plan:   Acute Hypoxic Respiratory Failure on mechanical ventilation  -ARDS secondary to  COVID-19 viral pneumonia Likely acute pulmonary edema Plan: Attempt one way extubation today If fails then transition to comfort care D/C sedation Ventilator associated pneumonia prevention protocol Family does not want tracheostomy, if fails extubation then transition to comfort care  Baseline HFpEF Lasix as ordered  COPD: Continue xopenex, atrovent, pulmicort through vent circuit  Acute toxic metabolic delirium History of stroke Minimize sedation Frequent orientation  Staph epididemis bacteremia > vanc per pharmacy > monitor repeat cultures   Best practice:  Diet:tube feed Pain/Anxiety/Delirium protocol (if indicated): Continuous infusion VAP protocol (if indicated): NA DVT prophylaxis: SCD, Lovenox GI prophylaxis: Protonix Glucose control: sliding scale Mobility: bed bound Code Status: Partial code - NO CPR, Family doesn't want tracheostomy Family Communication: see plan of care note from 4/18 Disposition: ICU   Labs   CBC: Recent Labs  Lab 06/02/18 0500  06/03/18 0435  06/04/18 0400  06/05/18 0500 06/05/18 1440  06/06/18 0235 06/06/18 0440 06/07/18 0310 06/08/18 0310  WBC 7.3  --  9.9  --  8.8  --  8.0  --  7.8  --  8.8 7.1  NEUTROABS 5.8  --  8.3*  --  7.1  --  6.2  --  6.0  --   --   --   HGB 7.4*   < > 8.1*  --  8.0*   < > 8.3* 9.2* 7.6* 8.8* 8.5* 7.6*  HCT 26.9*   < > 29.3*   < > 28.6*   < > 30.0* 27.0* 26.9* 26.0* 29.7*  27.7*  MCV 92.1  --  92.1  --  92.3  --  92.6  --  92.4  --  91.7 91.7  PLT 264  --  322  --  305  --  314  --  280  --  324 271   < > = values in this interval not displayed.    Basic Metabolic Panel: Recent Labs  Lab 06/02/18 0500  06/05/18 0500 06/05/18 1440 06/05/18 2350 06/06/18 0235 06/06/18 0440 06/07/18 0310 06/08/18 0310  NA 145   < > 148* 131* 133* 135 147* 141 144  K 4.3   < > 2.6* 4.1 3.8 3.8 4.1 3.6 3.8  CL 107   < > 124* 94* 101 101  --  104 108  CO2 31   < > 21*  --  26 27  --  28 28  GLUCOSE 131*   < > 89 656* 501* 419*  --  121* 97  BUN 44*   < > 37* 32* 35* 35*  --  34* 29*  CREATININE 0.97   < > 0.74 0.90 0.99 1.00  --  1.08* 0.93  CALCIUM 8.0*   < > 5.4*  --  7.1* 7.3*  --  7.6* 7.5*  MG 2.5*  --  1.6*  --   --  2.4  --  2.5* 2.5*   < > = values in this interval not displayed.   GFR: Estimated Creatinine Clearance: 81.9 mL/min (by C-G formula based on SCr of 0.93 mg/dL). Recent Labs  Lab 06/05/18 0500 06/06/18 0235 06/07/18 0310 06/08/18 0310  WBC 8.0 7.8 8.8 7.1    Liver Function Tests: Recent Labs  Lab 06/02/18 0500 06/03/18 0435 06/04/18 0400 06/05/18 0500 06/06/18 0235  AST 31 32 31 23 31   ALT 20 19 20 14 17   ALKPHOS 135* 150* 137* 82 109  BILITOT 1.3* 1.2 1.2 0.9 1.1  PROT 6.4* 6.8 6.8 4.7* 6.3*  ALBUMIN 2.3* 2.4* 2.4* 1.7* 2.1*   No results for input(s): LIPASE, AMYLASE in the last 168 hours. No results for input(s): AMMONIA in the last 168 hours.  ABG    Component Value Date/Time   PHART 7.463 (H) 06/06/2018 0440   PCO2ART 40.8 06/06/2018 0440   PO2ART 82.0 (L) 06/06/2018 0440   HCO3 28.9 (H) 06/06/2018 0440   TCO2 30 06/06/2018 0440   O2SAT 96.0 06/06/2018 0440     Coagulation Profile: Recent Labs  Lab 06/04/18 1043  INR 1.4*    Cardiac Enzymes: No results for input(s): CKTOTAL, CKMB, CKMBINDEX, TROPONINI in the last 168 hours.  HbA1C: Hgb A1c MFr Bld  Date/Time Value Ref Range Status  03/02/2018 04:43 PM 5.2  4.8 - 5.6 % Final    Comment:             Prediabetes: 5.7 - 6.4  Diabetes: >6.4          Glycemic control for adults with diabetes: <7.0     CBG: Recent Labs  Lab 06/07/18 1817 06/07/18 1959 06/07/18 2338 06/08/18 0341 06/08/18 0741  GLUCAP 129* 137* 135* 94 81   The patient is critically ill with multiple organ systems failure and requires high complexity decision making for assessment and support, frequent evaluation and titration of therapies, application of advanced monitoring technologies and extensive interpretation of multiple databases.   Critical Care Time devoted to patient care services described in this note is  32  Minutes. This time reflects time of care of this signee Dr Jennet Maduro. This critical care time does not reflect procedure time, or teaching time or supervisory time of PA/NP/Med student/Med Resident etc but could involve care discussion time.  Rush Farmer, M.D. [facility] Pulmonary/Critical Care Medicine. Pager: [phone]. After hours pager: [phone].

## 2018-06-08 NOTE — Progress Notes (Signed)
Spoke with the patient's sons over the phone.  They again reiterated no desire for tracheostomy.  I informed them that Shannon Carroll is as good as she is going to get and that we are ready for extubation.  They informed me that they are ready.  We discussed code status after and post extubation patient is to become a full DNR.  Will place orders in.  Rush Farmer, M.D. [facility] Pulmonary/Critical Care Medicine. Pager: [phone]. After hours pager: [phone].

## 2018-06-09 ENCOUNTER — Inpatient Hospital Stay (HOSPITAL_COMMUNITY): Payer: Medicare Other

## 2018-06-09 DIAGNOSIS — J690 Pneumonitis due to inhalation of food and vomit: Secondary | ICD-10-CM | POA: Diagnosis present

## 2018-06-09 LAB — PREPARE RBC (CROSSMATCH)

## 2018-06-09 LAB — BASIC METABOLIC PANEL
Anion gap: 8 (ref 5–15)
BUN: 27 mg/dL — ABNORMAL HIGH (ref 8–23)
CO2: 22 mmol/L (ref 22–32)
Calcium: 6.6 mg/dL — ABNORMAL LOW (ref 8.9–10.3)
Chloride: 114 mmol/L — ABNORMAL HIGH (ref 98–111)
Creatinine, Ser: 0.86 mg/dL (ref 0.44–1.00)
GFR calc Af Amer: >60 mL/min (ref >60)
GFR calc non Af Amer: >60 mL/min (ref >60)
Glucose, Bld: 136 mg/dL — ABNORMAL HIGH (ref 70–99)
Potassium: 3.7 mmol/L (ref 3.5–5.1)
Sodium: 144 mmol/L (ref 135–145)

## 2018-06-09 LAB — CBC
HCT: 24 % — ABNORMAL LOW (ref 36.0–46.0)
Hemoglobin: 6.7 g/dL — CL (ref 12.0–15.0)
MCH: 26 pg (ref 26.0–34.0)
MCHC: 27.9 g/dL — ABNORMAL LOW (ref 30.0–36.0)
MCV: 93 fL (ref 80.0–100.0)
Platelets: 216 10*3/uL (ref 150–400)
RBC: 2.58 MIL/uL — ABNORMAL LOW (ref 3.87–5.11)
RDW: 21.6 % — ABNORMAL HIGH (ref 11.5–15.5)
WBC: 11 10*3/uL — ABNORMAL HIGH (ref 4.0–10.5)
nRBC: 0 % (ref 0.0–0.2)

## 2018-06-09 LAB — HEMOGLOBIN AND HEMATOCRIT, BLOOD
HCT: 27.5 % — ABNORMAL LOW (ref 36.0–46.0)
Hemoglobin: 8 g/dL — ABNORMAL LOW (ref 12.0–15.0)

## 2018-06-09 LAB — GLUCOSE, CAPILLARY
Glucose-Capillary: 108 mg/dL — ABNORMAL HIGH (ref 70–99)
Glucose-Capillary: 115 mg/dL — ABNORMAL HIGH (ref 70–99)
Glucose-Capillary: 116 mg/dL — ABNORMAL HIGH (ref 70–99)
Glucose-Capillary: 123 mg/dL — ABNORMAL HIGH (ref 70–99)
Glucose-Capillary: 137 mg/dL — ABNORMAL HIGH (ref 70–99)

## 2018-06-09 LAB — MAGNESIUM: Magnesium: 1.9 mg/dL (ref 1.7–2.4)

## 2018-06-09 MED ORDER — ACETAMINOPHEN 325 MG PO TABS
650.0000 mg | ORAL_TABLET | Freq: Once | ORAL | Status: AC
  Administered 2018-06-09: 22:00:00 650 mg via ORAL

## 2018-06-09 MED ORDER — LIDOCAINE HCL (PF) 1 % IJ SOLN
0.0000 mL | Freq: Once | INTRAMUSCULAR | Status: DC | PRN
  Filled 2018-06-09: qty 30

## 2018-06-09 MED ORDER — SODIUM CHLORIDE 0.9% IV SOLUTION: Freq: Once | INTRAVENOUS | Status: DC

## 2018-06-09 NOTE — Progress Notes (Signed)
pts blood transfusion  Complete. 377ml. VS charted. Pt tolerated well. No adverse reactions.

## 2018-06-09 NOTE — Progress Notes (Signed)
Updated husband on patients status

## 2018-06-09 NOTE — Progress Notes (Signed)
Updated patient's sons [name] and Mitzi Hansen via FaceTime.

## 2018-06-09 NOTE — Progress Notes (Signed)
OT Cancellation Note  Patient Details Name: PHILLIS THACKERAY MRN: [id number] DOB: 07-20-1950   Cancelled Treatment:    Reason Eval/Treat Not Completed: Other (comment). Due to apparent decline, will sign off at this time. If pt becomes appropriate for therapy. Please reorder. Thanks.   Ramond Dial, OT/L   Acute OT Clinical Specialist Acute Rehabilitation Services Pager [phone] Office [phone]  06/09/2018, 11:45 AM

## 2018-06-09 NOTE — Progress Notes (Signed)
Late entry   Spoke with MD regarding patient's continued fever. Once dose Tylenol ordered and given. Providing patient with cold washcloths and ice packs as well. Will continue to monitor.

## 2018-06-09 NOTE — Progress Notes (Addendum)
PROGRESS NOTE    Shannon Carroll  FKC:[id number] DOB: [birth year] DOA: 06/10/2018 PCP: Harlan Stains, MD   Brief Narrative:  68 yr WF PMHx Chronic Diastolic (EF 74%), COPD, CVA, SVT, essential hypertension, obstructive sleep apnea, morbid obesity,   Presented to the hospital with acute shortness of breath was diagnosed with COVID-19 infection, went into acute hypoxic respiratory failure intubated and kept in ICU by pulmonary critical care. Transferred to [facility] covered ICU unit on 06/01/2018 under my care intubated.     Subjective: 4/21 unresponsive however on continuous sedation for intubation.    Assessment & Plan:   Principal Problem:   Multifocal pneumonia Active Problems:   Sleep apnea   Shortness of breath   Acute on chronic diastolic CHF (congestive heart failure) (HCC)   Hypertension   Stroke (HCC)   CKD (chronic kidney disease), stage III (HCC)   Hypokalemia   Abnormal LFTs   Acute respiratory failure (HCC)   Elevated troponin   SVT (supraventricular tachycardia) (HCC)   Acute on chronic respiratory failure (HCC)   Dementia, vascular (HCC)   ARDS (adult respiratory distress syndrome) ([place])   COVID-19 virus infection   Aspiration pneumonia of both lower lobes due to gastric secretions (HCC)   Acute respiratory failure with hypoxia/COVID-19 pneumonia -Currently intubated but did very well on pressure support today most likely ready for extubation. -PCCM managing - Completed treatment with hydroxychloroquine and azithromycin  Aspiration pneumonia - 4/20 patient had witnessed aspiration - 4/21 PCXR consistent with aspiration pneumonia.:  T-max last 24 hours 39.2 C - Complete 7-day course of antibiotics  Septic shock - Patient had been on low-dose Levophed currently off.  She had become hypotensive after being given a dose of Lasix   Staph Epidermidis Bacteremia  -Secondary to contaminated PICC line?  Case discussed with Dr. Prince Rome from ID who  recommended treating with vancomycin through PICC line and repeating blood cultures.  If blood cultures negative then could leave PICC line in if positive PICC line will need to be removed -Blood cultures positive for staph epidermidis 2/2  Acute renal failure -Resolved - Monitor creatinine level Recent Labs  Lab 06/05/18 2350 06/06/18 0235 06/07/18 0310 06/08/18 0310 06/09/18 0255  CREATININE 0.99 1.00 1.08* 0.93 0.86    Iron deficiency anemia, acute on chronic -Acute blood loss? - Fecal occult pending Recent Labs  Lab 06/06/18 0440 06/07/18 0310 06/08/18 0310 06/08/18 1629 06/09/18 0255  HGB 8.8* 8.5* 7.6* 8.8* 6.7*  - 4/21 transfuse 1 unit PRBC  Chronic diastolic CHF -Strict in and out +14.0 -Daily weight.   Filed Weights   06/07/18 0500 06/08/18 0438 06/09/18 0509  Weight: 122.8 kg 125.2 kg 126.8 kg    SVT -Short burst SVT duringCPAP on 4/14 resolved with beta-blockers continue as needed -Recent TTE shows EF of 65% with no WMA   Hypokalemia -Potassium goal > 4  Hypomagnesmia -Magnesium goal > 2    Morbid obesity   OSA -Currently vented  Diabetes type 2 controlled with complication - 9/44 hemoglobin A1c= 5.2  HLD - LDL not within AHA/ADA guidelines - 4/20 increase Lipitor to 40 mg daily  Nutrition -Continue tube feeds   Hx CVA - Plavix - ASA 81 mg    Acute metabolic encephalopathy -Underlying dementia - Recent CVA x2  -Encephalopathy appears to be resolving.      Code Status :  Partial with No CPR Disposition Plan  :  ICU    DVT prophylaxis: Lovenox Code Status: DNR Family  Communication: Updated Mitzi Hansen (son) on plan of care Disposition Plan: TBD   Consultants:  PCCM    Procedures/Significant Events:  4/21 PCXR-tube and catheter positions as described without pneumothorax. -Widespread airspace opacity bilaterally, similar to 1 day prior. Suspect combination of alveolar edema and pneumonia.  -Small pleural effusions noted.  Pulmonary vascular congestion present.    I have personally reviewed and interpreted all radiology studies and my findings are as above.  VENTILATOR SETTINGS: Mode: PRVC Vt set 440  set rate: 30 FiO2 40% PE 5    Cultures 4/3 blood RIGHT hand negative x2 4/5 MRSA by PCR negative 4/7 tracheal aspirate normal flora 4/15 urine negative 4/15 blood positive STAPHYLOCOCCUS EPIDERMIDIS 2/2 4/18 blood RIGHT hand NGTD 4/18 blood A-line NGTD    Antimicrobials: Anti-infectives (From admission, onward)   Start     Stop   06/07/18 1500  vancomycin (VANCOCIN) 1,500 mg in sodium chloride 0.9 % 500 mL IVPB         06/06/18 1500  vancomycin (VANCOCIN) 2,000 mg in sodium chloride 0.9 % 500 mL IVPB     06/06/18 1700   05/27/18 2200  ceFEPIme (MAXIPIME) 2 g in sodium chloride 0.9 % 100 mL IVPB  Status:  Discontinued     05/27/18 1400   05/27/18 1200  ceFEPIme (MAXIPIME) 2 g in sodium chloride 0.9 % 100 mL IVPB     05/27/18 1330   05/26/18 2200  azithromycin (ZITHROMAX) tablet 500 mg  Status:  Discontinued     05/26/18 1502   05/26/18 2200  azithromycin (ZITHROMAX) tablet 500 mg     05/27/18 2141   05/24/18 2200  hydroxychloroquine (PLAQUENIL) tablet 200 mg     05/28/18 1025   05/23/18 2300  azithromycin (ZITHROMAX) 500 mg in sodium chloride 0.9 % 250 mL IVPB  Status:  Discontinued     05/26/18 0715   05/23/18 2200  hydroxychloroquine (PLAQUENIL) tablet 400 mg     05/24/18 1114   05/23/18 1500  vancomycin (VANCOCIN) 1,250 mg in sodium chloride 0.9 % 250 mL IVPB  Status:  Discontinued     05/25/18 1141   05/23/18 0230  ceFEPIme (MAXIPIME) 2 g in sodium chloride 0.9 % 100 mL IVPB  Status:  Discontinued     05/25/18 1334   05/21/2018 1545  metroNIDAZOLE (FLAGYL) IVPB 500 mg     06/13/2018 1731   06/01/2018 1530  vancomycin (VANCOCIN) IVPB 1000 mg/200 mL premix     06/05/2018 1731   06/03/2018 1415  vancomycin (VANCOCIN) IVPB 1000 mg/200 mL premix     06/02/2018 1538   06/08/2018 1415  ceFEPIme  (MAXIPIME) 2 g in sodium chloride 0.9 % 100 mL IVPB     06/05/2018 1509       Devices #7.5 ETT 4/4>>>   LINES / TUBES:      Continuous Infusions: . sodium chloride Stopped (06/08/18 1743)  . ampicillin-sulbactam (UNASYN) IV 3 g (06/09/18 0850)  . dexmedetomidine (PRECEDEX) IV infusion 0.2 mcg/kg/hr (06/09/18 0800)  . lactated ringers 50 mL/hr at 06/09/18 0800  . norepinephrine (LEVOPHED) Adult infusion 3 mcg/min (06/09/18 0800)  . vancomycin Stopped (06/08/18 1625)     Objective: Vitals:   06/09/18 0728 06/09/18 0730 06/09/18 0751 06/09/18 0800  BP: _0   Pulse: 70 72 79   Resp: (!) 30 (!) 30 (!) 30   Temp:    (!) 102 F (38.9 C)  TempSrc:    Axillary  SpO2: 99% 99% 96%   Weight:  Height:        Intake/Output Summary (Last 24 hours) at 06/09/2018 1000 Last data filed at 06/09/2018 0920 Gross per 24 hour  Intake 7254.79 ml  Output 1060 ml  Net 6194.79 ml   Filed Weights   06/07/18 0500 06/08/18 0438 06/09/18 0509  Weight: 122.8 kg 125.2 kg 126.8 kg   General: Unresponsive, however sedated for intubation, positive acute respiratory distress Eyes: negative scleral hemorrhage, negative anisocoria, negative icterus ENT: Negative Runny nose, negative gingival bleeding, she has a #7.5 ETT in place negative sign of infection Neck:  Negative scars, masses, torticollis, lymphadenopathy, JVD Lungs: Diffuse rhonchi, without wheezes or crackles Cardiovascular: Regular rate and rhythm without murmur gallop or rub normal S1 and S2 Abdomen: Morbidly obese, negative abdominal pain, nondistended, positive soft, bowel sounds, no rebound, no ascites, no appreciable mass Extremities: No significant cyanosis, clubbing, or edema bilateral lower extremities Skin: Negative rashes, lesions, ulcers Psychiatric: Unable to assess secondary to intubation and sedation Central nervous system: Unable to assess secondary to intubation and sedation     .     Data  Reviewed: Care during the described time interval was provided by me .  I have reviewed this patient's available data, including medical history, events of note, physical examination, and all test results as part of my evaluation.   CBC: Recent Labs  Lab 06/03/18 0435  06/04/18 0400  06/05/18 0500  06/06/18 0235 06/06/18 0440 06/07/18 0310 06/08/18 0310 06/08/18 1629 06/09/18 0255  WBC 9.9  --  8.8  --  8.0  --  7.8  --  8.8 7.1  --  11.0*  NEUTROABS 8.3*  --  7.1  --  6.2  --  6.0  --   --   --   --   --   HGB 8.1*  --  8.0*   < > 8.3*   < > 7.6* 8.8* 8.5* 7.6* 8.8* 6.7*  HCT 29.3*   < > 28.6*   < > 30.0*   < > 26.9* 26.0* 29.7* 27.7* 26.0* 24.0*  MCV 92.1  --  92.3  --  92.6  --  92.4  --  91.7 91.7  --  93.0  PLT 322  --  305  --  314  --  280  --  324 271  --  216   < > = values in this interval not displayed.   Basic Metabolic Panel: Recent Labs  Lab 06/05/18 0500  06/05/18 2350 06/06/18 0235 06/06/18 0440 06/07/18 0310 06/08/18 0310 06/08/18 1629 06/09/18 0255  NA 148*   < > 133* 135 147* 141 144 146* 144  K 2.6*   < > 3.8 3.8 4.1 3.6 3.8 3.6 3.7  CL 124*   < > 101 101  --  104 108  --  114*  CO2 21*  --  26 27  --  28 28  --  22  GLUCOSE 89   < > 501* 419*  --  121* 97  --  136*  BUN 37*   < > 35* 35*  --  34* 29*  --  27*  CREATININE 0.74   < > 0.99 1.00  --  1.08* 0.93  --  0.86  CALCIUM 5.4*  --  7.1* 7.3*  --  7.6* 7.5*  --  6.6*  MG 1.6*  --   --  2.4  --  2.5* 2.5*  --  1.9   < > = values in this interval  not displayed.   GFR: Estimated Creatinine Clearance: 89.3 mL/min (by C-G formula based on SCr of 0.86 mg/dL). Liver Function Tests: Recent Labs  Lab 06/03/18 0435 06/04/18 0400 06/05/18 0500 06/06/18 0235  AST 32 _0 ALT _1 ALKPHOS 150* 137* 82 109  BILITOT 1.2 1.2 0.9 1.1  PROT 6.8 6.8 4.7* 6.3*  ALBUMIN 2.4* 2.4* 1.7* 2.1*   No results for input(s): LIPASE, AMYLASE in the last 168 hours. No results for input(s): AMMONIA in  the last 168 hours. Coagulation Profile: Recent Labs  Lab 06/04/18 1043  INR 1.4*   Cardiac Enzymes: No results for input(s): CKTOTAL, CKMB, CKMBINDEX, TROPONINI in the last 168 hours. BNP (last 3 results) No results for input(s): PROBNP in the last 8760 hours. HbA1C: No results for input(s): HGBA1C in the last 72 hours. CBG: Recent Labs  Lab 06/08/18 1609 06/08/18 1935 06/08/18 2259 06/09/18 0309 06/09/18 0749  GLUCAP 141* 121* 136* 115* 137*   Lipid Profile: No results for input(s): CHOL, HDL, LDLCALC, TRIG, CHOLHDL, LDLDIRECT in the last 72 hours. Thyroid Function Tests: No results for input(s): TSH, T4TOTAL, FREET4, T3FREE, THYROIDAB in the last 72 hours. Anemia Panel: No results for input(s): VITAMINB12, FOLATE, FERRITIN, TIBC, IRON, RETICCTPCT in the last 72 hours. Urine analysis:    Component Value Date/Time   COLORURINE YELLOW 06/03/2018 1347   APPEARANCEUR CLEAR 06/03/2018 1347   APPEARANCEUR Cloudy (A) 03/04/2018 1123   LABSPEC 1.019 06/03/2018 1347   PHURINE 8.0 06/03/2018 1347   GLUCOSEU NEGATIVE 06/03/2018 1347   HGBUR NEGATIVE 06/03/2018 1347   BILIRUBINUR NEGATIVE 06/03/2018 1347   BILIRUBINUR Negative 03/04/2018 1123   KETONESUR NEGATIVE 06/03/2018 1347   PROTEINUR 30 (A) 06/03/2018 1347   NITRITE NEGATIVE 06/03/2018 1347   LEUKOCYTESUR NEGATIVE 06/03/2018 1347   Sepsis Labs: _2 (procalcitonin:4,lacticidven:4)  ) Recent Results (from the past 240 hour(s))  Culture, Urine     Status: None   Collection Time: 06/03/18  1:48 PM  Result Value Ref Range Status   Specimen Description   Final    URINE, CATHETERIZED Performed at [facility] [address] [place], [place] [address]    Special Requests   Final    NONE Performed at [facility], [place] [address] [place], [place] [address]    Culture   Final    NO GROWTH Performed at [facility], [place] [address], [place], [place] [address]     Report Status 06/04/2018 FINAL  Final  Culture, blood (routine x 2)     Status: Abnormal   Collection Time: 06/03/18  6:15 PM  Result Value Ref Range Status   Specimen Description   Final    BLOOD BLOOD RIGHT HAND Performed at [facility] [address], [place], [place] [address]    Special Requests   Final    BOTTLES DRAWN AEROBIC AND ANAEROBIC Blood Culture adequate volume Performed at [facility] [address] [place], [place] [address]    Culture  Setup Time   Final    ANAEROBIC BOTTLE ONLY GRAM POSITIVE COCCI CRITICAL RESULT CALLED TO, READ BACK BY AND VERIFIED WITH: J.MIZE,RN AT 0022 ON 06/05/18 BY G.[email or web address] Performed at [facility], [place] [address], [place],  [address]    Culture STAPHYLOCOCCUS EPIDERMIDIS (A)  Final   Report Status 06/06/2018 FINAL  Final   Organism ID, Bacteria STAPHYLOCOCCUS EPIDERMIDIS  Final      Susceptibility  Staphylococcus epidermidis - MIC*    CIPROFLOXACIN <=0.5 SENSITIVE Sensitive     ERYTHROMYCIN >=8 RESISTANT Resistant     GENTAMICIN <=0.5 SENSITIVE Sensitive     OXACILLIN >=4 RESISTANT Resistant     TETRACYCLINE >=16 RESISTANT Resistant     VANCOMYCIN 2 SENSITIVE Sensitive     TRIMETH/SULFA <=10 SENSITIVE Sensitive     CLINDAMYCIN <=0.25 SENSITIVE Sensitive     RIFAMPIN <=0.5 SENSITIVE Sensitive     Inducible Clindamycin NEGATIVE Sensitive     * STAPHYLOCOCCUS EPIDERMIDIS  Blood Culture ID Panel (Reflexed)     Status: Abnormal   Collection Time: 06/03/18  6:15 PM  Result Value Ref Range Status   Enterococcus species NOT DETECTED NOT DETECTED Final   Listeria monocytogenes NOT DETECTED NOT DETECTED Final   Staphylococcus species DETECTED (A) NOT DETECTED Final    Comment: Methicillin (oxacillin) resistant coagulase negative staphylococcus. Possible blood culture contaminant (unless isolated from more than one blood culture draw or clinical case suggests pathogenicity). No antibiotic  treatment is indicated for blood  culture contaminants. CRITICAL RESULT CALLED TO, READ BACK BY AND VERIFIED WITH: J.MIZE,RN AT 0022 ON 06/05/18 BY G.MCADOO    Staphylococcus aureus (BCID) NOT DETECTED NOT DETECTED Final   Methicillin resistance DETECTED (A) NOT DETECTED Final    Comment: CRITICAL RESULT CALLED TO, READ BACK BY AND VERIFIED WITH: J.MIZE,RN AT 0022 ON 06/05/18 BY G.MCADOO    Streptococcus species NOT DETECTED NOT DETECTED Final   Streptococcus agalactiae NOT DETECTED NOT DETECTED Final   Streptococcus pneumoniae NOT DETECTED NOT DETECTED Final   Streptococcus pyogenes NOT DETECTED NOT DETECTED Final   Acinetobacter baumannii NOT DETECTED NOT DETECTED Final   Enterobacteriaceae species NOT DETECTED NOT DETECTED Final   Enterobacter cloacae complex NOT DETECTED NOT DETECTED Final   Escherichia coli NOT DETECTED NOT DETECTED Final   Klebsiella oxytoca NOT DETECTED NOT DETECTED Final   Klebsiella pneumoniae NOT DETECTED NOT DETECTED Final   Proteus species NOT DETECTED NOT DETECTED Final   Serratia marcescens NOT DETECTED NOT DETECTED Final   Haemophilus influenzae NOT DETECTED NOT DETECTED Final   Neisseria meningitidis NOT DETECTED NOT DETECTED Final   Pseudomonas aeruginosa NOT DETECTED NOT DETECTED Final   Candida albicans NOT DETECTED NOT DETECTED Final   Candida glabrata NOT DETECTED NOT DETECTED Final   Candida krusei NOT DETECTED NOT DETECTED Final   Candida parapsilosis NOT DETECTED NOT DETECTED Final   Candida tropicalis NOT DETECTED NOT DETECTED Final    Comment: Performed at [facility], [place] [address], [place], [place] [address]  Culture, blood (routine x 2)     Status: Abnormal   Collection Time: 06/03/18  6:30 PM  Result Value Ref Range Status   Specimen Description   Final    BLOOD BLOOD RIGHT WRIST Performed at [facility] [address] [place], [place] [address]    Special Requests   Final    BOTTLES DRAWN AEROBIC ONLY  Blood Culture adequate volume Performed at [facility] [address], [place], [place] [address]    Culture  Setup Time   Final    AEROBIC BOTTLE ONLY GRAM POSITIVE COCCI CRITICAL VALUE NOTED.  VALUE IS CONSISTENT WITH PREVIOUSLY REPORTED AND CALLED VALUE.    Culture (A)  Final    STAPHYLOCOCCUS EPIDERMIDIS SUSCEPTIBILITIES PERFORMED ON PREVIOUS CULTURE WITHIN THE LAST 5 DAYS. Performed at [facility], [place] [address] [place], [place] [address]    Report Status 06/07/2018 FINAL  Final  Culture, blood (Routine X 2) w Reflex to ID Panel     Status: None (Preliminary result)   Collection Time: 06/06/18  1:43 PM  Result Value Ref Range Status   Specimen Description   Final    BLOOD RIGHT HAND Performed at [facility] [address], [place], [place] [address]    Special Requests   Final    BOTTLES DRAWN AEROBIC ONLY Blood Culture results may not be optimal due to an inadequate volume of blood received in culture bottles Performed at [facility] [address], [place], [place] [address]    Culture   Final    NO GROWTH 2 DAYS Performed at [facility] [address] [place], [place] [address]    Report Status PENDING  Incomplete  Culture, blood (Routine X 2) w Reflex to ID Panel     Status: None (Preliminary result)   Collection Time: 06/06/18  1:48 PM  Result Value Ref Range Status   Specimen Description   Final    BLOOD A-LINE Performed at [facility], [place] [address], [place], [place] [address]    Special Requests   Final    BOTTLES DRAWN AEROBIC ONLY Blood Culture adequate volume Performed at [facility] [address] [place], [place] [address]    Culture   Final    NO GROWTH 2 DAYS Performed at [facility] [address], [place],  [address]    Report Status PENDING  Incomplete         Radiology Studies: Dg Chest Port 1 View  Result Date:  06/09/2018 CLINICAL DATA:  Hypoxia EXAM: PORTABLE CHEST 1 VIEW COMPARISON:  June 08, 2018 FINDINGS: Endotracheal tube tip is 3.1 cm above the carina. Central catheter tip is in the superior vena cava. Nasogastric tube tip and side port are below the diaphragm. No pneumothorax. There is persistent cardiomegaly with pulmonary vascular congestion. There are bilateral pleural effusions with extensive airspace opacity bilaterally, similar to 1 day prior. No new opacity evident. There is aortic atherosclerosis. There is calcification in the mitral annulus region. No bone lesions. IMPRESSION: Tube and catheter positions as described without pneumothorax. Widespread airspace opacity bilaterally, similar to 1 day prior. Suspect combination of alveolar edema and pneumonia. Small pleural effusions noted. Pulmonary vascular congestion present. Aortic Atherosclerosis (ICD10-I70.0). Electronically Signed   By: Lowella Grip III M.D.   On: 06/09/2018 08:37   Dg Chest Port 1 View  Result Date: 06/08/2018 CLINICAL DATA:  68 year old female status post gastric tube placement EXAM: PORTABLE CHEST 1 VIEW COMPARISON:  Prior chest x-ray 06/06/2018 FINDINGS: The tip of the endotracheal tube is 5.1 cm above the carina. A left subclavian central venous catheter is in place with the tip at the superior cavoatrial junction. A gastric tube is present. The tip lies below the field of view, within the stomach (as seen on recent prior imaging). Stable cardiomegaly. Increased perihilar and bibasilar interstitial and confluent airspace opacities. Probable small bilateral layering pleural effusions. Background of diffuse interstitial prominence similar compared to prior. No pneumothorax. No acute osseous abnormality. IMPRESSION: 1. Worsening bilateral perihilar and bibasilar airspace disease concerning for progressive pulmonary edema superimposed on underlying viral pneumonia. 2. Stable cardiomegaly. 3. Stable and satisfactory support  apparatus. Electronically Signed   By: Jacqulynn Cadet M.D.   On: 06/08/2018 15:28   Dg Abd Portable 1v  Result Date: 06/08/2018 CLINICAL DATA:  Orogastric tube placement. EXAM:  PORTABLE ABDOMEN - 1 VIEW COMPARISON:  Radiograph of June 06, 2018. FINDINGS: The bowel gas pattern is normal. Distal tip of orogastric tube is seen in expected position of distal stomach. No radio-opaque calculi or other significant radiographic abnormality are seen. IMPRESSION: Distal tip of orogastric tube seen in expected position of distal stomach. Electronically Signed   By: Marijo Conception M.D.   On: 06/08/2018 15:01        Scheduled Meds: . sodium chloride   Intravenous Once  . alteplase  2 mg Intracatheter Once  . atorvastatin  40 mg Per Tube q1800  . budesonide (PULMICORT) nebulizer solution  0.25 mg Nebulization BID  . chlorhexidine gluconate (MEDLINE KIT)  15 mL Mouth Rinse BID  . Chlorhexidine Gluconate Cloth  6 each Topical Daily  . clopidogrel  75 mg Per Tube Daily  . docusate  50 mg Oral BID  . enoxaparin (LOVENOX) injection  60 mg Subcutaneous Q24H  . feeding supplement (JEVITY 1.2 CAL)  1,000 mL Per Tube Q24H  . feeding supplement (PRO-STAT SUGAR FREE 64)  30 mL Per Tube TID WC  . fentaNYL  1 patch Transdermal Q72H  . free water  200 mL Per Tube Q6H  . insulin aspart  0-15 Units Subcutaneous Q4H  . mouth rinse  15 mL Mouth Rinse 10 times per day  . metoprolol tartrate  12.5 mg Per Tube BID  . pantoprazole sodium  40 mg Per Tube Daily  . sennosides  5 mL Oral QHS  . silver sulfADIAZINE  1 application Topical Daily  . sodium chloride flush  10-40 mL Intracatheter Q12H  . sodium chloride flush  3 mL Intravenous Q12H  . venlafaxine  37.5 mg Per Tube QHS  . venlafaxine  75 mg Per Tube q morning - 10a   Continuous Infusions: . sodium chloride Stopped (06/08/18 1743)  . ampicillin-sulbactam (UNASYN) IV 3 g (06/09/18 0850)  . dexmedetomidine (PRECEDEX) IV infusion 0.2 mcg/kg/hr (06/09/18  0800)  . lactated ringers 50 mL/hr at 06/09/18 0800  . norepinephrine (LEVOPHED) Adult infusion 3 mcg/min (06/09/18 0800)  . vancomycin Stopped (06/08/18 1625)     LOS: 18 days   The patient is critically ill with multiple organ systems failure and requires high complexity decision making for assessment and support, frequent evaluation and titration of therapies, application of advanced monitoring technologies and extensive interpretation of multiple databases. Critical Care Time devoted to patient care services described in this note  Time spent: 40 minutes     WOODS, Geraldo Docker, MD [facility] Pager [phone]  If 7PM-7AM, please contact night-coverage [email or web address] Password [email or web address] 06/09/2018, 10:00 AM

## 2018-06-09 NOTE — Progress Notes (Signed)
NAME:  Shannon Carroll, MRN:  [id number], DOB:  1950-05-02, LOS: 53 ADMISSION DATE:  05/30/2018, CONSULTATION DATE:  05/23/2018 REFERRING MD:  Glenna Durand, CHIEF COMPLAINT:  SVT hpoxemia, COVID 12 POS   Brief History   67 yr old female with PMHx HFpEF, COPD, w/ bilateral infiltrates COVID positive on supplemental oxygen 8L with increased work of breathing, fatigue, and diaphoretic. PCCM consulted for acute respiratory distress.  Significant Hospital Events   4/3 + COVID -19  4/4 ICU admit intubated  4/12 weaning trial, failed, de-sat, SVT  4/15 episodes of SVT  Consults:  PCCM   Procedures:  Endotracheal intubation- 05/23/2018  Significant Diagnostic Tests:  Bilateral alveolar infiltrates on CXR -CXR 4/8 with worsening LLL and RML opacity  4/18 CT head > stable R frontal infarct, NAICP  Micro Data:  Blood cx 06/07/2018- NGTD Novel Coronavirus Positive result called 4/4. Tracheal aspirate 4/7>>> moderate WBC (abundant GPC and GNR)   4/15 blood > staph epidermidis 2/4 bottles 4/15 urine > neg 4/18 blood >    Antimicrobials:  Vancomycin- 05/23/2018 Cefepime- 05/23/2018-4/6, 4/8>>  Hydroxychloroquine- upon transfer to ICU -COVID (to end 4/9) Axithromycin - upon transfer to ICU- COVID (to end 4/8)  4/18 vancomycin  Interim history/subjective:  No events overnight Aspirated TF  Objective   Blood pressure 95/65, pulse 79, temperature (!) 102 F (38.9 C), temperature source Axillary, resp. rate (!) 30, height 5\' 8"  (1.727 m), weight 126.8 kg, SpO2 96 %.    Vent Mode: PRVC FiO2 (%):  [30 %-100 %] 40 % Set Rate:  [12 bmp-32 bmp] 30 bmp Vt Set:  [380 mL-510 mL] 440 mL PEEP:  [5 cmH20-14 cmH20] 5 cmH20 Plateau Pressure:  [25 cmH20-33 cmH20] 25 cmH20   Intake/Output Summary (Last 24 hours) at 06/09/2018 1012 Last data filed at 06/09/2018 0920 Gross per 24 hour  Intake 7254.79 ml  Output 1060 ml  Net 6194.79 ml   Filed Weights   06/07/18 0500 06/08/18 0438 06/09/18 0509  Weight:  122.8 kg 125.2 kg 126.8 kg   Examination:   General:  Chronically ill appearing, NAD HENT: [place]/AT, PERRL, EOM-I and ETT in place PULM: CTA bilaterally CV: RRR, Nl S1/S2 and -M/R/G GI: Soft, NT, ND and +BS MSK: normal bulk and tone Neuro: Sedate, withdraws all ext to command  I reviewed CXR myself, ETT is in a good position and infiltrate noted  Resolved Hospital Problem list   Transaminits.  Septic shock  Assessment & Plan:   Acute respiratory failure:  - Maintain on full vent support  - Titrate O2 for sat of 88-92%  - No trach, one way extubation when ready  - Unasyn for aspiration PNA  - Pulmicort  - Albuterol  Hemodynamics:  - Levophed for BP support, titrate for MAP of 65  Sedation:  - Minimize sedation as able  COVID:  - Supportive care  Best practice:  Diet:tube feed Pain/Anxiety/Delirium protocol (if indicated): Continuous infusion VAP protocol (if indicated): NA DVT prophylaxis: SCD, Lovenox GI prophylaxis: Protonix Glucose control: sliding scale Mobility: bed bound Code Status: Partial code - NO CPR, Family doesn't want tracheostomy Family Communication: see plan of care note from 4/18 Disposition: ICU   Labs   CBC: Recent Labs  Lab 06/03/18 0435  06/04/18 0400  06/05/18 0500  06/06/18 0235 06/06/18 0440 06/07/18 0310 06/08/18 0310 06/08/18 1629 06/09/18 0255  WBC 9.9  --  8.8  --  8.0  --  7.8  --  8.8 7.1  --  11.0*  NEUTROABS 8.3*  --  7.1  --  6.2  --  6.0  --   --   --   --   --   HGB 8.1*  --  8.0*   < > 8.3*   < > 7.6* 8.8* 8.5* 7.6* 8.8* 6.7*  HCT 29.3*   < > 28.6*   < > 30.0*   < > 26.9* 26.0* 29.7* 27.7* 26.0* 24.0*  MCV 92.1  --  92.3  --  92.6  --  92.4  --  91.7 91.7  --  93.0  PLT 322  --  305  --  314  --  280  --  324 271  --  216   < > = values in this interval not displayed.    Basic Metabolic Panel: Recent Labs  Lab 06/05/18 0500  06/05/18 2350 06/06/18 0235 06/06/18 0440 06/07/18 0310 06/08/18 0310 06/08/18  1629 06/09/18 0255  NA 148*   < > 133* 135 147* 141 144 146* 144  K 2.6*   < > 3.8 3.8 4.1 3.6 3.8 3.6 3.7  CL 124*   < > 101 101  --  104 108  --  114*  CO2 21*  --  26 27  --  28 28  --  22  GLUCOSE 89   < > 501* 419*  --  121* 97  --  136*  BUN 37*   < > 35* 35*  --  34* 29*  --  27*  CREATININE 0.74   < > 0.99 1.00  --  1.08* 0.93  --  0.86  CALCIUM 5.4*  --  7.1* 7.3*  --  7.6* 7.5*  --  6.6*  MG 1.6*  --   --  2.4  --  2.5* 2.5*  --  1.9   < > = values in this interval not displayed.   GFR: Estimated Creatinine Clearance: 89.3 mL/min (by C-G formula based on SCr of 0.86 mg/dL). Recent Labs  Lab 06/06/18 0235 06/07/18 0310 06/08/18 0310 06/09/18 0255  WBC 7.8 8.8 7.1 11.0*    Liver Function Tests: Recent Labs  Lab 06/03/18 0435 06/04/18 0400 06/05/18 0500 06/06/18 0235  AST 32 31 23 31   ALT 19 20 14 17   ALKPHOS 150* 137* 82 109  BILITOT 1.2 1.2 0.9 1.1  PROT 6.8 6.8 4.7* 6.3*  ALBUMIN 2.4* 2.4* 1.7* 2.1*   No results for input(s): LIPASE, AMYLASE in the last 168 hours. No results for input(s): AMMONIA in the last 168 hours.  ABG    Component Value Date/Time   PHART 7.392 06/08/2018 1629   PCO2ART 44.2 06/08/2018 1629   PO2ART 71.0 (L) 06/08/2018 1629   HCO3 26.4 06/08/2018 1629   TCO2 28 06/08/2018 1629   O2SAT 92.0 06/08/2018 1629     Coagulation Profile: Recent Labs  Lab 06/04/18 1043  INR 1.4*   Cardiac Enzymes: No results for input(s): CKTOTAL, CKMB, CKMBINDEX, TROPONINI in the last 168 hours.  HbA1C: Hgb A1c MFr Bld  Date/Time Value Ref Range Status  03/02/2018 04:43 PM 5.2 4.8 - 5.6 % Final    Comment:             Prediabetes: 5.7 - 6.4          Diabetes: >6.4          Glycemic control for adults with diabetes: <7.0    CBG: Recent Labs  Lab 06/08/18 1609 06/08/18 1935 06/08/18 2259 06/09/18 0309 06/09/18  [place]*   The patient is critically ill with multiple organ systems failure and requires  high complexity decision making for assessment and support, frequent evaluation and titration of therapies, application of advanced monitoring technologies and extensive interpretation of multiple databases.   Critical Care Time devoted to patient care services described in this note is  32  Minutes. This time reflects time of care of this signee Dr Jennet Maduro. This critical care time does not reflect procedure time, or teaching time or supervisory time of PA/NP/Med student/Med Resident etc but could involve care discussion time.  Rush Farmer, M.D. [facility] Pulmonary/Critical Care Medicine. Pager: [phone]. After hours pager: [phone].

## 2018-06-09 NOTE — Progress Notes (Addendum)
PT Cancellation Note  Patient Details Name: Shannon Carroll MRN: [id number] DOB: [birth year]   Cancelled Treatment:    Reason Eval/Treat Not Completed: Medical issues which prohibited therapy.  Pt Hgb is low and per RN MD requesting rest today.  OT signed off.  PT will follow along to see if pt stabilizes more.  Thanks,  [name]. [name], PT, DPT  Acute Rehabilitation [phone] pager [phone]) [phone] office     [name] 06/09/2018, 12:10 PM  06/09/2018 Update/addendum:  0211 I just spoke with Dr. Nelda Marseille who recommended sign off.  He will re-order if necessary in the future.    Thanks,  [name]. Sonal Dorwart, PT, DPT  Acute Rehabilitation [phone] pager [phone]) [phone] office

## 2018-06-09 NOTE — Progress Notes (Signed)
CRITICAL VALUE ALERT  Critical Value:  Hemoglobin 6.7  Date & Time Notied:  06/09/18 4:45 AM  Provider Notified: Dr. Herbert Moors  Orders Received/Actions taken: Type and screen, prepare and transfuse 1UPRBC.   No blood consent in chart. Will await type and screen results to call family for consent.

## 2018-06-09 NOTE — Progress Notes (Signed)
pts blood transfusioin. Began at 1150, this RN completed the transfusion in epic on error at 1200. The blood is running at 125ml/hr. Pre and post VS complete. Will cont to monitor duriing transfusion.

## 2018-06-09 NOTE — Progress Notes (Signed)
Spoke with pts Son Mitzi Hansen and pts Husband , verbal consent for blood transfusion given .

## 2018-06-10 DIAGNOSIS — L602 Onychogryphosis: Secondary | ICD-10-CM

## 2018-06-10 LAB — BASIC METABOLIC PANEL
Anion gap: 8 (ref 5–15)
BUN: 33 mg/dL — ABNORMAL HIGH (ref 8–23)
CO2: 26 mmol/L (ref 22–32)
Calcium: 7.4 mg/dL — ABNORMAL LOW (ref 8.9–10.3)
Chloride: 110 mmol/L (ref 98–111)
Creatinine, Ser: 0.93 mg/dL (ref 0.44–1.00)
GFR calc Af Amer: >60 mL/min (ref >60)
GFR calc non Af Amer: >60 mL/min (ref >60)
Glucose, Bld: 113 mg/dL — ABNORMAL HIGH (ref 70–99)
Potassium: 3.3 mmol/L — ABNORMAL LOW (ref 3.5–5.1)
Sodium: 144 mmol/L (ref 135–145)

## 2018-06-10 LAB — TYPE AND SCREEN
ABO/RH(D): O POS
Antibody Screen: NEGATIVE
Unit division: 0

## 2018-06-10 LAB — CBC
HCT: 28.7 % — ABNORMAL LOW (ref 36.0–46.0)
Hemoglobin: 8 g/dL — ABNORMAL LOW (ref 12.0–15.0)
MCH: 25.9 pg — ABNORMAL LOW (ref 26.0–34.0)
MCHC: 27.9 g/dL — ABNORMAL LOW (ref 30.0–36.0)
MCV: 92.9 fL (ref 80.0–100.0)
Platelets: 265 10*3/uL (ref 150–400)
RBC: 3.09 MIL/uL — ABNORMAL LOW (ref 3.87–5.11)
RDW: 21.4 % — ABNORMAL HIGH (ref 11.5–15.5)
WBC: 12.1 10*3/uL — ABNORMAL HIGH (ref 4.0–10.5)
nRBC: 0 % (ref 0.0–0.2)

## 2018-06-10 LAB — BPAM RBC
Blood Product Expiration Date: 202004262359
ISSUE DATE / TIME: 202004211050
Unit Type and Rh: 5100

## 2018-06-10 LAB — MAGNESIUM: Magnesium: 2.5 mg/dL — ABNORMAL HIGH (ref 1.7–2.4)

## 2018-06-10 LAB — GLUCOSE, CAPILLARY
Glucose-Capillary: 106 mg/dL — ABNORMAL HIGH (ref 70–99)
Glucose-Capillary: 117 mg/dL — ABNORMAL HIGH (ref 70–99)
Glucose-Capillary: 112 mg/dL — ABNORMAL HIGH (ref 70–99)
Glucose-Capillary: 119 mg/dL — ABNORMAL HIGH (ref 70–99)
Glucose-Capillary: 114 mg/dL — ABNORMAL HIGH (ref 70–99)
Glucose-Capillary: 121 mg/dL — ABNORMAL HIGH (ref 70–99)
Glucose-Capillary: 112 mg/dL — ABNORMAL HIGH (ref 70–99)

## 2018-06-10 LAB — VANCOMYCIN, PEAK: Vancomycin Pk: 53 ug/mL (ref 30–40)

## 2018-06-10 MED ORDER — POTASSIUM CHLORIDE 10 MEQ/100ML IV SOLN
10.0000 meq | INTRAVENOUS | Status: AC
  Administered 2018-06-10 (×5): 10 meq via INTRAVENOUS
  Filled 2018-06-10 (×3): qty 100

## 2018-06-10 MED ORDER — OSMOLITE 1.2 CAL PO LIQD
1000.0000 mL | ORAL | Status: DC
  Administered 2018-06-10 – 2018-06-14 (×3): 1000 mL

## 2018-06-10 MED ORDER — PRO-STAT SUGAR FREE PO LIQD
60.0000 mL | Freq: Three times a day (TID) | ORAL | Status: DC
  Administered 2018-06-10 – 2018-06-15 (×14): 60 mL
  Filled 2018-06-10 (×10): qty 60

## 2018-06-10 NOTE — Progress Notes (Signed)
NAME:  Shannon Carroll, MRN:  [id number], DOB:  1950-11-02, LOS: 57 ADMISSION DATE:  06/06/2018, CONSULTATION DATE:  05/23/2018 REFERRING MD:  Glenna Durand, CHIEF COMPLAINT:  SVT hpoxemia, COVID 63 POS   Brief History   68 yr old female with PMHx HFpEF, COPD, w/ bilateral infiltrates COVID positive on supplemental oxygen 8L with increased work of breathing, fatigue, and diaphoretic. PCCM consulted for acute respiratory distress.  Significant Hospital Events   4/3 + COVID -19  4/4 ICU admit intubated  4/12 weaning trial, failed, de-sat, SVT  4/15 episodes of SVT  Consults:  PCCM   Procedures:  Endotracheal intubation- 05/23/2018  Significant Diagnostic Tests:  Bilateral alveolar infiltrates on CXR -CXR 4/8 with worsening LLL and RML opacity  4/18 CT head > stable R frontal infarct, NAICP  Micro Data:  Blood cx 06/12/2018- NGTD Novel Coronavirus Positive result called 4/4. Tracheal aspirate 4/7>>> moderate WBC (abundant GPC and GNR)   4/15 blood > staph epidermidis 2/4 bottles 4/15 urine > neg 4/18 blood >    Antimicrobials:  Vancomycin- 05/23/2018 Cefepime- 05/23/2018-4/6, 4/8>>  Hydroxychloroquine- upon transfer to ICU -COVID (to end 4/9) Axithromycin - upon transfer to ICU- COVID (to end 4/8)  4/18 vancomycin  Interim history/subjective:  No events overnight, did not tolerate wean this AM  Objective   Blood pressure 91/71, pulse 88, temperature 98.9 F (37.2 C), temperature source Axillary, resp. rate (!) 30, height 5\' 8"  (1.727 m), weight 130.7 kg, SpO2 98 %.    Vent Mode: PRVC FiO2 (%):  [35 %-40 %] 35 % Set Rate:  [30 bmp] 30 bmp Vt Set:  [440 mL] 440 mL PEEP:  [5 cmH20] 5 cmH20 Pressure Support:  [15 cmH20] 15 cmH20 Plateau Pressure:  [24 cmH20-25 cmH20] 25 cmH20   Intake/Output Summary (Last 24 hours) at 06/10/2018 1124 Last data filed at 06/10/2018 1104 Gross per 24 hour  Intake 2665.81 ml  Output 1065 ml  Net 1600.81 ml   Filed Weights   06/08/18 0438 06/09/18  0509 06/10/18 0500  Weight: 125.2 kg 126.8 kg 130.7 kg   Examination:   General:  Chronically ill appearing female, NAD HENT: [place]/AT, PERRL, EOM-I and MMM PULM: Coarse BS diffusely CV: RRR, Nl S1/S2 and -M/R/G GI: Soft, NT, ND and +BS MSK: normal bulk and tone Neuro: Sedated but arousable and following commands  I reviewed CXR myself, worsening infiltrate noted  Resolved Hospital Problem list   Transaminits.  Septic shock  Assessment & Plan:   Acute respiratory failure:  - Attempt PS trials  - Titrate O2 for sat of 88-92%  - One way extubation when ready  - Continue on unasyn for now  - Pulmicort  - PRN albuterol  Hemodynamics:  - Levophed down to 6 mcg for MAP goal of 65 mmHg  Sedation:  - Minimize sedation as able (precedex and PRN fentanyl)  COVID:  - Supportive care  Sons updated, patient is not ready for extubation today but they do not wish for a tracheostomy as that is against their mother's wishes.  Once extubated then DNR.  Best practice:  Diet:tube feed Pain/Anxiety/Delirium protocol (if indicated): Continuous infusion VAP protocol (if indicated): NA DVT prophylaxis: SCD, Lovenox GI prophylaxis: Protonix Glucose control: sliding scale Mobility: bed bound Code Status: Partial code - NO CPR, Family doesn't want tracheostomy Family Communication: see plan of care note from 4/18 Disposition: ICU   Labs   CBC: Recent Labs  Lab 06/04/18 0400  06/05/18 0500  06/06/18 0235  06/07/18  0310 06/08/18 0310 06/08/18 1629 06/09/18 0255 06/09/18 1820 06/10/18 0500  WBC 8.8  --  8.0  --  7.8  --  8.8 7.1  --  11.0*  --  12.1*  NEUTROABS 7.1  --  6.2  --  6.0  --   --   --   --   --   --   --   HGB 8.0*   < > 8.3*   < > 7.6*   < > 8.5* 7.6* 8.8* 6.7* 8.0* 8.0*  HCT 28.6*   < > 30.0*   < > 26.9*   < > 29.7* 27.7* 26.0* 24.0* 27.5* 28.7*  MCV 92.3  --  92.6  --  92.4  --  91.7 91.7  --  93.0  --  92.9  PLT 305  --  314  --  280  --  324 271  --  216  --  265    < > = values in this interval not displayed.    Basic Metabolic Panel: Recent Labs  Lab 06/06/18 0235  06/07/18 0310 06/08/18 0310 06/08/18 1629 06/09/18 0255 06/10/18 0500  NA 135   < > 141 144 146* 144 144  K 3.8   < > 3.6 3.8 3.6 3.7 3.3*  CL 101  --  104 108  --  114* 110  CO2 27  --  28 28  --  22 26  GLUCOSE 419*  --  121* 97  --  136* 113*  BUN 35*  --  34* 29*  --  27* 33*  CREATININE 1.00  --  1.08* 0.93  --  0.86 0.93  CALCIUM 7.3*  --  7.6* 7.5*  --  6.6* 7.4*  MG 2.4  --  2.5* 2.5*  --  1.9 2.5*   < > = values in this interval not displayed.   GFR: Estimated Creatinine Clearance: 84 mL/min (by C-G formula based on SCr of 0.93 mg/dL). Recent Labs  Lab 06/07/18 0310 06/08/18 0310 06/09/18 0255 06/10/18 0500  WBC 8.8 7.1 11.0* 12.1*    Liver Function Tests: Recent Labs  Lab 06/04/18 0400 06/05/18 0500 06/06/18 0235  AST 31 23 31   ALT 20 14 17   ALKPHOS 137* 82 109  BILITOT 1.2 0.9 1.1  PROT 6.8 4.7* 6.3*  ALBUMIN 2.4* 1.7* 2.1*   No results for input(s): LIPASE, AMYLASE in the last 168 hours. No results for input(s): AMMONIA in the last 168 hours.  ABG    Component Value Date/Time   PHART 7.392 06/08/2018 1629   PCO2ART 44.2 06/08/2018 1629   PO2ART 71.0 (L) 06/08/2018 1629   HCO3 26.4 06/08/2018 1629   TCO2 28 06/08/2018 1629   O2SAT 92.0 06/08/2018 1629     Coagulation Profile: Recent Labs  Lab 06/04/18 1043  INR 1.4*   Cardiac Enzymes: No results for input(s): CKTOTAL, CKMB, CKMBINDEX, TROPONINI in the last 168 hours.  HbA1C: Hgb A1c MFr Bld  Date/Time Value Ref Range Status  03/02/2018 04:43 PM 5.2 4.8 - 5.6 % Final    Comment:             Prediabetes: 5.7 - 6.4          Diabetes: >6.4          Glycemic control for adults with diabetes: <7.0    CBG: Recent Labs  Lab 06/09/18 1707 06/09/18 2014 06/09/18 2333 06/10/18 0333 06/10/18 0806  GLUCAP 117* 123* 108* 112* 119*   The  patient is critically ill with multiple  organ systems failure and requires high complexity decision making for assessment and support, frequent evaluation and titration of therapies, application of advanced monitoring technologies and extensive interpretation of multiple databases.   Critical Care Time devoted to patient care services described in this note is  90  Minutes. This time reflects time of care of this signee Dr Jennet Maduro. This critical care time does not reflect procedure time, or teaching time or supervisory time of PA/NP/Med student/Med Resident etc but could involve care discussion time.  Rush Farmer, M.D. [facility] Pulmonary/Critical Care Medicine. Pager: [phone]. After hours pager: [phone].

## 2018-06-10 NOTE — Progress Notes (Addendum)
PROGRESS NOTE    Shannon Carroll  ZDG:[id number] DOB: Jun 22, 1950 DOA: 06/11/2018 PCP: Harlan Stains, MD   Brief Narrative:  68 yr WF PMHx Chronic Diastolic (EF 95%), COPD, CVA, SVT, essential hypertension, obstructive sleep apnea, morbid obesity,   Presented to the hospital with acute shortness of breath was diagnosed with COVID-19 infection, went into acute hypoxic respiratory failure intubated and kept in ICU by pulmonary critical care. Transferred to [facility] covered ICU unit on 06/01/2018 under my care intubated.     Subjective: 4/22 opens eyes to painful stimuli does not follow commands.  On sedation for comfort as did not tolerate vent weaning this morning    Assessment & Plan:   Principal Problem:   Multifocal pneumonia Active Problems:   Sleep apnea   Shortness of breath   Acute on chronic diastolic CHF (congestive heart failure) (HCC)   Hypertension   Stroke (HCC)   CKD (chronic kidney disease), stage III (HCC)   Hypokalemia   Abnormal LFTs   Acute respiratory failure (HCC)   Elevated troponin   SVT (supraventricular tachycardia) (HCC)   Acute on chronic respiratory failure (HCC)   Dementia, vascular (HCC)   ARDS (adult respiratory distress syndrome) ([place])   COVID-19 virus infection   Aspiration pneumonia of both lower lobes due to gastric secretions (HCC)   Acute respiratory failure with hypoxia/COVID-19 pneumonia -Not be extubated today patient did not tolerate vent weaning this a.m. had to be placed back on PRVC with increased sedation for comfort.. -PCCM managing - Completed treatment with hydroxychloroquine and azithromycin  Aspiration pneumonia - 4/20 patient had witnessed aspiration - 4/21 PCXR consistent with aspiration pneumonia.: -4/22 T-max last 24 hours 38.9 C - Complete 7-day course of antibiotics  Septic shock - Patient back on Levophed to maintain adequate perfusion pressure. -Titrate Levophed to maintain MAP>65   Staph  Epidermidis Bacteremia  -Secondary to contaminated PICC line?  Case discussed with Dr. Prince Rome from ID who recommended treating with vancomycin through PICC line and repeating blood cultures.  If blood cultures negative then could leave PICC line in if positive PICC line will need to be removed -Blood cultures positive for staph epidermidis 2/2  Acute renal failure -Resolved - Monitor creatinine level Recent Labs  Lab 06/06/18 0235 06/07/18 0310 06/08/18 0310 06/09/18 0255 06/10/18 0500  CREATININE 1.00 1.08* 0.93 0.86 0.93    Iron deficiency anemia, acute on chronic -Acute blood loss? - Fecal occult pending Recent Labs  Lab 06/08/18 0310 06/08/18 1629 06/09/18 0255 06/09/18 1820 06/10/18 0500  HGB 7.6* 8.8* 6.7* 8.0* 8.0*  - 4/21 transfuse 1 unit PRBC  Chronic diastolic CHF -Strict in and out +14.3 L -Daily weight.   Filed Weights   06/08/18 0438 06/09/18 0509 06/10/18 0500  Weight: 125.2 kg 126.8 kg 130.7 kg  -Unable to diurese secondary to patient's hypotension  SVT -Short burst SVT duringCPAP on 4/14 resolved with beta-blockers continue as needed -Recent TTE shows EF of 65% with no WMA   Hypokalemia -Potassium goal>4 -Potassium 50 mEq  Hypomagnesmia -Magnesium goal > 2    Morbid obesity   OSA -Currently vented  Diabetes type 2 controlled with complication - 1/88 hemoglobin A1c= 5.2  HLD - LDL not within AHA/ADA guidelines - 4/20 increase Lipitor to 40 mg daily  Nutrition -Continue tube feeds   Hx CVA - Plavix - ASA 81 mg    Acute metabolic encephalopathy -Underlying dementia - Recent CVA x2  -Encephalopathy appears to be resolving.  Code Status :  Partial with No CPR Disposition Plan  :  ICU    DVT prophylaxis: Lovenox Code Status: DNR Family Communication: Updated [name] (son) on plan of care Disposition Plan: TBD   Consultants:  PCCM    Procedures/Significant Events:  4/21 PCXR-tube and catheter positions as described  without pneumothorax. -Widespread airspace opacity bilaterally, similar to 1 day prior. Suspect combination of alveolar edema and pneumonia.  -Small pleural effusions noted. Pulmonary vascular congestion present.    I have personally reviewed and interpreted all radiology studies and my findings are as above.  VENTILATOR SETTINGS: Mode: PRVC Vt set 440 set rate: 30 FiO2 35% PEEP: 5    Cultures 4/3 blood RIGHT hand negative x2 4/5 MRSA by PCR negative 4/7 tracheal aspirate normal flora 4/15 urine negative 4/15 blood positive STAPHYLOCOCCUS EPIDERMIDIS 2/2 4/18 blood RIGHT hand NGTD 4/18 blood A-line NGTD    Antimicrobials: Anti-infectives (From admission, onward)   Start     Stop   06/07/18 1500  vancomycin (VANCOCIN) 1,500 mg in sodium chloride 0.9 % 500 mL IVPB         06/06/18 1500  vancomycin (VANCOCIN) 2,000 mg in sodium chloride 0.9 % 500 mL IVPB     06/06/18 1700   05/27/18 2200  ceFEPIme (MAXIPIME) 2 g in sodium chloride 0.9 % 100 mL IVPB  Status:  Discontinued     05/27/18 1400   05/27/18 1200  ceFEPIme (MAXIPIME) 2 g in sodium chloride 0.9 % 100 mL IVPB     05/27/18 1330   05/26/18 2200  azithromycin (ZITHROMAX) tablet 500 mg  Status:  Discontinued     05/26/18 1502   05/26/18 2200  azithromycin (ZITHROMAX) tablet 500 mg     05/27/18 2141   05/24/18 2200  hydroxychloroquine (PLAQUENIL) tablet 200 mg     05/28/18 1025   05/23/18 2300  azithromycin (ZITHROMAX) 500 mg in sodium chloride 0.9 % 250 mL IVPB  Status:  Discontinued     05/26/18 0715   05/23/18 2200  hydroxychloroquine (PLAQUENIL) tablet 400 mg     05/24/18 1114   05/23/18 1500  vancomycin (VANCOCIN) 1,250 mg in sodium chloride 0.9 % 250 mL IVPB  Status:  Discontinued     05/25/18 1141   05/23/18 0230  ceFEPIme (MAXIPIME) 2 g in sodium chloride 0.9 % 100 mL IVPB  Status:  Discontinued     05/25/18 1334   06/01/2018 1545  metroNIDAZOLE (FLAGYL) IVPB 500 mg     06/05/2018 1731   05/26/2018 1530   vancomycin (VANCOCIN) IVPB 1000 mg/200 mL premix     06/08/2018 1731   05/23/2018 1415  vancomycin (VANCOCIN) IVPB 1000 mg/200 mL premix     06/05/2018 1538   06/12/2018 1415  ceFEPIme (MAXIPIME) 2 g in sodium chloride 0.9 % 100 mL IVPB     06/06/2018 1509       Devices #7.5 ETT 4/4>>>   LINES / TUBES:      Continuous Infusions: . sodium chloride Stopped (06/08/18 1743)  . ampicillin-sulbactam (UNASYN) IV 3 g (06/10/18 0304)  . dexmedetomidine (PRECEDEX) IV infusion Stopped (06/09/18 1558)  . lactated ringers 50 mL/hr at 06/10/18 0301  . norepinephrine (LEVOPHED) Adult infusion 6 mcg/min (06/10/18 0658)  . vancomycin 1,500 mg (06/09/18 1556)     Objective: Vitals:   06/10/18 0615 06/10/18 0630 06/10/18 0645 06/10/18 0700  BP: 109/83 110/79 107/81 114/81  Pulse: 86 86 84 86  Resp: (!) 30 (!) 30 (!) 26 (!) 30  Temp:      TempSrc:      SpO2: 95% 96% 97% 96%  Weight:      Height:        Intake/Output Summary (Last 24 hours) at 06/10/2018 8325 Last data filed at 06/10/2018 4982 Gross per 24 hour  Intake 2159.63 ml  Output 965 ml  Net 1194.63 ml   Filed Weights   06/08/18 0438 06/09/18 0509 06/10/18 0500  Weight: 125.2 kg 126.8 kg 130.7 kg   General: Opens eyes to painful stimuli, did not tolerate weaning today, positive acute respiratory distress Eyes: negative scleral hemorrhage, negative anisocoria, negative icterus ENT: Negative Runny nose, negative gingival bleeding, #7.5 ETT T in place negative sign of infection Neck:  Negative scars, masses, torticollis, lymphadenopathy, JVD Lungs: Clear to auscultation bilaterally without wheezes or crackles Cardiovascular: Regular rate and rhythm without murmur gallop or rub normal S1 and S2 Abdomen: Morbidly obese, negative abdominal pain, nondistended, positive soft, bowel sounds, no rebound, no ascites, no appreciable mass Extremities: No significant cyanosis, clubbing, or edema bilateral lower extremities Skin: Negative rashes,  lesions, ulcers Psychiatric: Unable to assess secondary to intubation and sedation  Central nervous system: Unable to assess secondary to intubation and sedation.      .     Data Reviewed: Care during the described time interval was provided by me .  I have reviewed this patient's available data, including medical history, events of note, physical examination, and all test results as part of my evaluation.   CBC: Recent Labs  Lab 06/04/18 0400  06/05/18 0500  06/06/18 0235  06/07/18 0310 06/08/18 0310 06/08/18 1629 06/09/18 0255 06/09/18 1820 06/10/18 0500  WBC 8.8  --  8.0  --  7.8  --  8.8 7.1  --  11.0*  --  12.1*  NEUTROABS 7.1  --  6.2  --  6.0  --   --   --   --   --   --   --   HGB 8.0*   < > 8.3*   < > 7.6*   < > 8.5* 7.6* 8.8* 6.7* 8.0* 8.0*  HCT 28.6*   < > 30.0*   < > 26.9*   < > 29.7* 27.7* 26.0* 24.0* 27.5* 28.7*  MCV 92.3  --  92.6  --  92.4  --  91.7 91.7  --  93.0  --  92.9  PLT 305  --  314  --  280  --  324 271  --  216  --  265   < > = values in this interval not displayed.   Basic Metabolic Panel: Recent Labs  Lab 06/06/18 0235  06/07/18 0310 06/08/18 0310 06/08/18 1629 06/09/18 0255 06/10/18 0500  NA 135   < > 141 144 146* 144 144  K 3.8   < > 3.6 3.8 3.6 3.7 3.3*  CL 101  --  104 108  --  114* 110  CO2 27  --  28 28  --  22 26  GLUCOSE 419*  --  121* 97  --  136* 113*  BUN 35*  --  34* 29*  --  27* 33*  CREATININE 1.00  --  1.08* 0.93  --  0.86 0.93  CALCIUM 7.3*  --  7.6* 7.5*  --  6.6* 7.4*  MG 2.4  --  2.5* 2.5*  --  1.9 2.5*   < > = values in this interval not displayed.   GFR: Estimated Creatinine Clearance:  84 mL/min (by C-G formula based on SCr of 0.93 mg/dL). Liver Function Tests: Recent Labs  Lab 06/04/18 0400 06/05/18 0500 06/06/18 0235  AST '31 23 31  ' ALT '20 14 17  ' ALKPHOS 137* 82 109  BILITOT 1.2 0.9 1.1  PROT 6.8 4.7* 6.3*  ALBUMIN 2.4* 1.7* 2.1*   No results for input(s): LIPASE, AMYLASE in the last 168 hours. No  results for input(s): AMMONIA in the last 168 hours. Coagulation Profile: Recent Labs  Lab 06/04/18 1043  INR 1.4*   Cardiac Enzymes: No results for input(s): CKTOTAL, CKMB, CKMBINDEX, TROPONINI in the last 168 hours. BNP (last 3 results) No results for input(s): PROBNP in the last 8760 hours. HbA1C: No results for input(s): HGBA1C in the last 72 hours. CBG: Recent Labs  Lab 06/09/18 0749 06/09/18 1225 06/09/18 2014 06/09/18 2333 06/10/18 0333  GLUCAP 137* 116* 123* 108* 112*   Lipid Profile: No results for input(s): CHOL, HDL, LDLCALC, TRIG, CHOLHDL, LDLDIRECT in the last 72 hours. Thyroid Function Tests: No results for input(s): TSH, T4TOTAL, FREET4, T3FREE, THYROIDAB in the last 72 hours. Anemia Panel: No results for input(s): VITAMINB12, FOLATE, FERRITIN, TIBC, IRON, RETICCTPCT in the last 72 hours. Urine analysis:    Component Value Date/Time   COLORURINE YELLOW 06/03/2018 1347   APPEARANCEUR CLEAR 06/03/2018 1347   APPEARANCEUR Cloudy (A) 03/04/2018 1123   LABSPEC 1.019 06/03/2018 1347   PHURINE 8.0 06/03/2018 1347   GLUCOSEU NEGATIVE 06/03/2018 1347   HGBUR NEGATIVE 06/03/2018 1347   BILIRUBINUR NEGATIVE 06/03/2018 1347   BILIRUBINUR Negative 03/04/2018 1123   KETONESUR NEGATIVE 06/03/2018 1347   PROTEINUR 30 (A) 06/03/2018 1347   NITRITE NEGATIVE 06/03/2018 1347   LEUKOCYTESUR NEGATIVE 06/03/2018 1347   Sepsis Labs: '@LABRCNTIP' (procalcitonin:4,lacticidven:4)  ) Recent Results (from the past 240 hour(s))  Culture, Urine     Status: None   Collection Time: 06/03/18  1:48 PM  Result Value Ref Range Status   Specimen Description   Final    URINE, CATHETERIZED Performed at [facility] [address], [place], [place] [address]    Special Requests   Final    NONE Performed at [facility], [place] [address] [place], [place] [address]    Culture   Final    NO GROWTH Performed at [facility], [place] [address] [place], [place] [address]    Report Status 06/04/2018 FINAL  Final  Culture, blood (routine x 2)     Status: Abnormal   Collection Time: 06/03/18  6:15 PM  Result Value Ref Range Status   Specimen Description   Final    BLOOD BLOOD RIGHT HAND Performed at [facility] [address] [place], [place] [address]    Special Requests   Final    BOTTLES DRAWN AEROBIC AND ANAEROBIC Blood Culture adequate volume Performed at [facility] [address] [place], [place] [address]    Culture  Setup Time   Final    ANAEROBIC BOTTLE ONLY GRAM POSITIVE COCCI CRITICAL RESULT CALLED TO, READ BACK BY AND VERIFIED WITH: J.MIZE,RN AT 0022 ON 06/05/18 BY G.MCADOO Performed at [facility], [place] [address] [place],  [address]    Culture STAPHYLOCOCCUS EPIDERMIDIS (A)  Final   Report Status 06/06/2018 FINAL  Final   Organism ID, Bacteria STAPHYLOCOCCUS EPIDERMIDIS  Final      Susceptibility   Staphylococcus epidermidis - MIC*    CIPROFLOXACIN <=0.5 SENSITIVE Sensitive  ERYTHROMYCIN >=8 RESISTANT Resistant     GENTAMICIN <=0.5 SENSITIVE Sensitive     OXACILLIN >=4 RESISTANT Resistant     TETRACYCLINE >=16 RESISTANT Resistant     VANCOMYCIN 2 SENSITIVE Sensitive     TRIMETH/SULFA <=10 SENSITIVE Sensitive     CLINDAMYCIN <=0.25 SENSITIVE Sensitive     RIFAMPIN <=0.5 SENSITIVE Sensitive     Inducible Clindamycin NEGATIVE Sensitive     * STAPHYLOCOCCUS EPIDERMIDIS  Blood Culture ID Panel (Reflexed)     Status: Abnormal   Collection Time: 06/03/18  6:15 PM  Result Value Ref Range Status   Enterococcus species NOT DETECTED NOT DETECTED Final   Listeria monocytogenes NOT DETECTED NOT DETECTED Final   Staphylococcus species DETECTED (A) NOT DETECTED Final    Comment: Methicillin (oxacillin) resistant coagulase negative staphylococcus. Possible blood culture contaminant (unless isolated from more than one blood culture draw or clinical case suggests  pathogenicity). No antibiotic treatment is indicated for blood  culture contaminants. CRITICAL RESULT CALLED TO, READ BACK BY AND VERIFIED WITH: J.MIZE,RN AT 0022 ON 06/05/18 BY G.MCADOO    Staphylococcus aureus (BCID) NOT DETECTED NOT DETECTED Final   Methicillin resistance DETECTED (A) NOT DETECTED Final    Comment: CRITICAL RESULT CALLED TO, READ BACK BY AND VERIFIED WITH: J.MIZE,RN AT 0022 ON 06/05/18 BY G.MCADOO    Streptococcus species NOT DETECTED NOT DETECTED Final   Streptococcus agalactiae NOT DETECTED NOT DETECTED Final   Streptococcus pneumoniae NOT DETECTED NOT DETECTED Final   Streptococcus pyogenes NOT DETECTED NOT DETECTED Final   Acinetobacter baumannii NOT DETECTED NOT DETECTED Final   Enterobacteriaceae species NOT DETECTED NOT DETECTED Final   Enterobacter cloacae complex NOT DETECTED NOT DETECTED Final   Escherichia coli NOT DETECTED NOT DETECTED Final   Klebsiella oxytoca NOT DETECTED NOT DETECTED Final   Klebsiella pneumoniae NOT DETECTED NOT DETECTED Final   Proteus species NOT DETECTED NOT DETECTED Final   Serratia marcescens NOT DETECTED NOT DETECTED Final   Haemophilus influenzae NOT DETECTED NOT DETECTED Final   Neisseria meningitidis NOT DETECTED NOT DETECTED Final   Pseudomonas aeruginosa NOT DETECTED NOT DETECTED Final   Candida albicans NOT DETECTED NOT DETECTED Final   Candida glabrata NOT DETECTED NOT DETECTED Final   Candida krusei NOT DETECTED NOT DETECTED Final   Candida parapsilosis NOT DETECTED NOT DETECTED Final   Candida tropicalis NOT DETECTED NOT DETECTED Final    Comment: Performed at [facility], [place] [address] [place], [place] [address]  Culture, blood (routine x 2)     Status: Abnormal   Collection Time: 06/03/18  6:30 PM  Result Value Ref Range Status   Specimen Description   Final    BLOOD BLOOD RIGHT WRIST Performed at [facility] [address], [place], [place] [address]    Special Requests   Final     BOTTLES DRAWN AEROBIC ONLY Blood Culture adequate volume Performed at [facility] [address] [place], [place] [address]    Culture  Setup Time   Final    AEROBIC BOTTLE ONLY GRAM POSITIVE COCCI CRITICAL VALUE NOTED.  VALUE IS CONSISTENT WITH PREVIOUSLY REPORTED AND CALLED VALUE.    Culture (A)  Final    STAPHYLOCOCCUS EPIDERMIDIS SUSCEPTIBILITIES PERFORMED ON PREVIOUS CULTURE WITHIN THE LAST 5 DAYS. Performed at [facility], [place] [address] [place], [place] [address]    Report Status 06/07/2018 FINAL  Final  Culture, blood (Routine X 2) w Reflex to ID Panel  Status: None (Preliminary result)   Collection Time: 06/06/18  1:43 PM  Result Value Ref Range Status   Specimen Description   Final    BLOOD RIGHT HAND Performed at [facility] [address] [place], [place] [address]    Special Requests   Final    BOTTLES DRAWN AEROBIC ONLY Blood Culture results may not be optimal due to an inadequate volume of blood received in culture bottles Performed at [facility] [address] [place], [place] [address]    Culture   Final    NO GROWTH 3 DAYS Performed at [facility], [place] [address], [place], [place] [address]    Report Status PENDING  Incomplete  Culture, blood (Routine X 2) w Reflex to ID Panel     Status: None (Preliminary result)   Collection Time: 06/06/18  1:48 PM  Result Value Ref Range Status   Specimen Description   Final    BLOOD A-LINE Performed at [facility], [place] [address] [place], [place] [address]    Special Requests   Final    BOTTLES DRAWN AEROBIC ONLY Blood Culture adequate volume Performed at [facility] [address] [place], [place] [address]    Culture   Final    NO GROWTH 3 DAYS Performed at [facility], [place] [address], [place], [place] [address]    Report Status PENDING  Incomplete         Radiology Studies: Dg Chest  Port 1 View  Result Date: 06/09/2018 CLINICAL DATA:  Hypoxia EXAM: PORTABLE CHEST 1 VIEW COMPARISON:  June 08, 2018 FINDINGS: Endotracheal tube tip is 3.1 cm above the carina. Central catheter tip is in the superior vena cava. Nasogastric tube tip and side port are below the diaphragm. No pneumothorax. There is persistent cardiomegaly with pulmonary vascular congestion. There are bilateral pleural effusions with extensive airspace opacity bilaterally, similar to 1 day prior. No new opacity evident. There is aortic atherosclerosis. There is calcification in the mitral annulus region. No bone lesions. IMPRESSION: Tube and catheter positions as described without pneumothorax. Widespread airspace opacity bilaterally, similar to 1 day prior. Suspect combination of alveolar edema and pneumonia. Small pleural effusions noted. Pulmonary vascular congestion present. Aortic Atherosclerosis (ICD10-I70.0). Electronically Signed   By: Lowella Grip III M.D.   On: 06/09/2018 08:37   Dg Chest Port 1 View  Result Date: 06/08/2018 CLINICAL DATA:  68 year old female status post gastric tube placement EXAM: PORTABLE CHEST 1 VIEW COMPARISON:  Prior chest x-ray 06/06/2018 FINDINGS: The tip of the endotracheal tube is 5.1 cm above the carina. A left subclavian central venous catheter is in place with the tip at the superior cavoatrial junction. A gastric tube is present. The tip lies below the field of view, within the stomach (as seen on recent prior imaging). Stable cardiomegaly. Increased perihilar and bibasilar interstitial and confluent airspace opacities. Probable small bilateral layering pleural effusions. Background of diffuse interstitial prominence similar compared to prior. No pneumothorax. No acute osseous abnormality. IMPRESSION: 1. Worsening bilateral perihilar and bibasilar airspace disease concerning for progressive pulmonary edema superimposed on underlying viral pneumonia. 2. Stable cardiomegaly. 3. Stable  and satisfactory support apparatus. Electronically Signed   By: Jacqulynn Cadet M.D.   On: 06/08/2018 15:28   Dg Abd Portable 1v  Result Date: 06/08/2018 CLINICAL DATA:  Orogastric tube placement. EXAM: PORTABLE ABDOMEN - 1 VIEW COMPARISON:  Radiograph of June 06, 2018. FINDINGS: The bowel gas  pattern is normal. Distal tip of orogastric tube is seen in expected position of distal stomach. No radio-opaque calculi or other significant radiographic abnormality are seen. IMPRESSION: Distal tip of orogastric tube seen in expected position of distal stomach. Electronically Signed   By: Marijo Conception M.D.   On: 06/08/2018 15:01        Scheduled Meds: . sodium chloride   Intravenous Once  . alteplase  2 mg Intracatheter Once  . atorvastatin  40 mg Per Tube q1800  . chlorhexidine gluconate (MEDLINE KIT)  15 mL Mouth Rinse BID  . Chlorhexidine Gluconate Cloth  6 each Topical Daily  . clopidogrel  75 mg Per Tube Daily  . docusate  50 mg Oral BID  . enoxaparin (LOVENOX) injection  60 mg Subcutaneous Q24H  . feeding supplement (JEVITY 1.2 CAL)  1,000 mL Per Tube Q24H  . feeding supplement (PRO-STAT SUGAR FREE 64)  30 mL Per Tube TID WC  . fentaNYL  1 patch Transdermal Q72H  . free water  200 mL Per Tube Q6H  . insulin aspart  0-15 Units Subcutaneous Q4H  . mouth rinse  15 mL Mouth Rinse 10 times per day  . metoprolol tartrate  12.5 mg Per Tube BID  . pantoprazole sodium  40 mg Per Tube Daily  . sennosides  5 mL Oral QHS  . silver sulfADIAZINE  1 application Topical Daily  . sodium chloride flush  10-40 mL Intracatheter Q12H  . sodium chloride flush  3 mL Intravenous Q12H  . venlafaxine  37.5 mg Per Tube QHS  . venlafaxine  75 mg Per Tube q morning - 10a   Continuous Infusions: . sodium chloride Stopped (06/08/18 1743)  . ampicillin-sulbactam (UNASYN) IV 3 g (06/10/18 0304)  . dexmedetomidine (PRECEDEX) IV infusion Stopped (06/09/18 1558)  . lactated ringers 50 mL/hr at 06/10/18 0301   . norepinephrine (LEVOPHED) Adult infusion 6 mcg/min (06/10/18 0658)  . vancomycin 1,500 mg (06/09/18 1556)     LOS: 19 days   The patient is critically ill with multiple organ systems failure and requires high complexity decision making for assessment and support, frequent evaluation and titration of therapies, application of advanced monitoring technologies and extensive interpretation of multiple databases. Critical Care Time devoted to patient care services described in this note  Time spent: 40 minutes     Tyr Franca, Geraldo Docker, MD [facility] Pager [phone]  If 7PM-7AM, please contact night-coverage [email or web address] Password [facility] 06/10/2018, 8:12 AM

## 2018-06-10 NOTE — Progress Notes (Signed)
Nutrition Follow-up  DOCUMENTATION CODES:   Morbid obesity  INTERVENTION:   Tube feeding:  Switch to Osmolite 1.2 at 40 ml/hr Increase Pro-Stat 60 mL TID Provides 143 g of protein, 1752 kcals and 778 mL of free water  NUTRITION DIAGNOSIS:   Inadequate oral intake related to inability to eat as evidenced by NPO status.  Being addressed via TF   GOAL:   Provide needs based on ASPEN/SCCM guidelines  Met  MONITOR:   Vent status, TF tolerance, Labs, Skin, I & O's  REASON FOR ASSESSMENT:   Ventilator, Consult Enteral/tube feeding initiation and management  ASSESSMENT:   68 yo female with PMH of obesity, DM, HTN, HLD, CKD, stage 2, stroke who was admitted with SVT, hypoxemia, COVID-19 positive. Required intubation 4/4.  4/03 Admit 4/04 intubated 4/12 failed weaning, desat , SVT 4/15 episodes of SVT  Pt remains on vent support, noted no plan for trach, one way extubation when ready, currently on levophed  Jevity 1.2 at 40 ml/hr, Pro-Stat 30 mL TID (supposed to be 60 mL TID), free water 200 mL q 6 hours  Current weight 130.7 kg; admission wt 121.3 kg. Noted pt with mild generalized edema. Net +14 L  Per I/O flow sheet; plan to utilize 121.3 kg as EDW   Labs: potassium 3.3,  Meds: LR at 50 ml/hr, KCl   Diet Order:   Diet Order    None      EDUCATION NEEDS:   No education needs have been identified at this time  Skin:  Skin Assessment: Skin Integrity Issues: Skin Integrity Issues:: Other (Comment) Other: open blister L pretibial; MASD to breast, abdomen, groin  Last BM:  4/21  Height:   Ht Readings from Last 1 Encounters:  05/23/18 '5\' 8"'  (1.727 m)    Weight:   Wt Readings from Last 1 Encounters:  06/10/18 130.7 kg    Ideal Body Weight:  63.6 kg  BMI:  Body mass index is 43.81 kg/m.  Estimated Nutritional Needs:   Kcal:  9611-6435  Protein:  130-160 gm  Fluid:  1.9 L   Kerman Passey MS, RD, LDN, CNSC [phone] Pager  [phone] Weekend/On-Call Pager

## 2018-06-10 NOTE — Progress Notes (Addendum)
Updated patient's son, Mitzi Hansen, over the phone. Later on the evening facetimed with Mitzi Hansen.

## 2018-06-11 LAB — CULTURE, BLOOD (ROUTINE X 2)
Culture: NO GROWTH
Culture: NO GROWTH
Special Requests: ADEQUATE

## 2018-06-11 LAB — CBC
HCT: 28.7 % — ABNORMAL LOW (ref 36.0–46.0)
Hemoglobin: 8.1 g/dL — ABNORMAL LOW (ref 12.0–15.0)
MCH: 26.2 pg (ref 26.0–34.0)
MCHC: 28.2 g/dL — ABNORMAL LOW (ref 30.0–36.0)
MCV: 92.9 fL (ref 80.0–100.0)
Platelets: 251 10*3/uL (ref 150–400)
RBC: 3.09 MIL/uL — ABNORMAL LOW (ref 3.87–5.11)
RDW: 21.5 % — ABNORMAL HIGH (ref 11.5–15.5)
WBC: 10.7 10*3/uL — ABNORMAL HIGH (ref 4.0–10.5)
nRBC: 0 % (ref 0.0–0.2)

## 2018-06-11 LAB — BASIC METABOLIC PANEL
Anion gap: 6 (ref 5–15)
BUN: 31 mg/dL — ABNORMAL HIGH (ref 8–23)
CO2: 27 mmol/L (ref 22–32)
Calcium: 7.4 mg/dL — ABNORMAL LOW (ref 8.9–10.3)
Chloride: 109 mmol/L (ref 98–111)
Creatinine, Ser: 0.9 mg/dL (ref 0.44–1.00)
GFR calc Af Amer: >60 mL/min (ref >60)
GFR calc non Af Amer: >60 mL/min (ref >60)
Glucose, Bld: 115 mg/dL — ABNORMAL HIGH (ref 70–99)
Potassium: 3.8 mmol/L (ref 3.5–5.1)
Sodium: 142 mmol/L (ref 135–145)

## 2018-06-11 LAB — GLUCOSE, CAPILLARY
Glucose-Capillary: 105 mg/dL — ABNORMAL HIGH (ref 70–99)
Glucose-Capillary: 107 mg/dL — ABNORMAL HIGH (ref 70–99)
Glucose-Capillary: 112 mg/dL — ABNORMAL HIGH (ref 70–99)
Glucose-Capillary: 114 mg/dL — ABNORMAL HIGH (ref 70–99)
Glucose-Capillary: 117 mg/dL — ABNORMAL HIGH (ref 70–99)
Glucose-Capillary: 120 mg/dL — ABNORMAL HIGH (ref 70–99)

## 2018-06-11 LAB — VANCOMYCIN, TROUGH: Vancomycin Tr: 16 ug/mL (ref 15–20)

## 2018-06-11 LAB — MAGNESIUM: Magnesium: 2.4 mg/dL (ref 1.7–2.4)

## 2018-06-11 MED ORDER — VANCOMYCIN HCL 1000 MG IV SOLR
1000.0000 mg | INTRAVENOUS | Status: DC
  Filled 2018-06-11: qty 1000

## 2018-06-11 MED ORDER — VANCOMYCIN HCL IN DEXTROSE 1-5 GM/200ML-% IV SOLN
1000.0000 mg | INTRAVENOUS | Status: DC
  Administered 2018-06-11 – 2018-06-14 (×4): 1000 mg via INTRAVENOUS
  Filled 2018-06-11 (×5): qty 200

## 2018-06-11 NOTE — Progress Notes (Signed)
PROGRESS NOTE    Shannon Carroll  OAC:[id number] DOB: 06-Oct-1950 DOA: 06/04/2018 PCP: Harlan Stains, MD   Brief Narrative:  60 yr WF PMHx Chronic Diastolic (EF 01%), COPD, CVA, SVT, essential hypertension, obstructive sleep apnea, morbid obesity,   Presented to the hospital with acute shortness of breath was diagnosed with COVID-19 infection, went into acute hypoxic respiratory failure intubated and kept in ICU by pulmonary critical care. Transferred to [facility] ICU unit on 06/01/2018.     Subjective: Awake, has eyes open, does not respond to questions or follow commands    Assessment & Plan:   Principal Problem:   Multifocal pneumonia Active Problems:   Sleep apnea   Shortness of breath   Acute on chronic diastolic CHF (congestive heart failure) (HCC)   Hypertension   Stroke (HCC)   CKD (chronic kidney disease), stage III (HCC)   Hypokalemia   Abnormal LFTs   Acute respiratory failure (HCC)   Elevated troponin   SVT (supraventricular tachycardia) (HCC)   Acute on chronic respiratory failure (HCC)   Dementia, vascular (HCC)   ARDS (adult respiratory distress syndrome) ([place])   COVID-19 virus infection   Aspiration pneumonia of both lower lobes due to gastric secretions (HCC)   Acute respiratory failure with hypoxia/COVID-19 pneumonia -PCCM managing, weaning trials per critical care - Completed treatment with hydroxychloroquine and azithromycin  Aspiration pneumonia - 4/20 patient had witnessed aspiration - 4/21 PCXR consistent with aspiration pneumonia.: -4/22 T-max last 24 hours 38.3 C - Complete 7-day course of antibiotics, currently on unasyn  Septic shock - Patient back on Levophed to maintain adequate perfusion pressure. -Titrate Levophed to maintain MAP>65   Staph Epidermidis Bacteremia  -Secondary to contaminated PICC line?  Case discussed with Dr. Prince Rome from ID who recommended treating with vancomycin through PICC line and repeating  blood cultures.  If repeat blood cultures negative then could leave PICC line in if positive PICC line will need to be removed -Initial Blood cultures positive for staph epidermidis 2/2 -Repeat cultures have shown no growth  Acute renal failure -Resolved - Monitor creatinine level Recent Labs  Lab 06/07/18 0310 06/08/18 0310 06/09/18 0255 06/10/18 0500 06/11/18 0500  CREATININE 1.08* 0.93 0.86 0.93 0.90    Iron deficiency anemia, acute on chronic - Likely related to critical illness - Fecal occult pending Recent Labs  Lab 06/08/18 1629 06/09/18 0255 06/09/18 1820 06/10/18 0500 06/11/18 0500  HGB 8.8* 6.7* 8.0* 8.0* 8.1*  - 4/21 transfuse 1 unit PRBC  Chronic diastolic CHF -Strict in and out +14.3 L -Daily weight.   Filed Weights   06/09/18 0509 06/10/18 0500 06/11/18 0500  Weight: 126.8 kg 130.7 kg 133.8 kg  -Unable to diurese secondary to patient's hypotension  SVT -Short burst SVT duringCPAP on 4/14 resolved with beta-blockers continue as needed -Recent TTE shows EF of 65% with no WMA   Morbid obesity   OSA -Currently vented  Diabetes type 2 controlled with complication - 0/93 hemoglobin A1c= 5.2  HLD - LDL not within AHA/ADA guidelines - 4/20 increase Lipitor to 40 mg daily  Nutrition -Continue tube feeds   Hx CVA - Plavix - ASA 81 mg    Acute metabolic encephalopathy -Underlying dementia - Recent CVA x2  -Encephalopathy appears to be resolving.      Code Status :  Partial with No CPR Disposition Plan  :  ICU    DVT prophylaxis: Lovenox Code Status: DNR Family Communication: Updated [name] (son) on plan of care Disposition  Plan: TBD   Consultants:  PCCM    Procedures/Significant Events:  4/21 PCXR-tube and catheter positions as described without pneumothorax. -Widespread airspace opacity bilaterally, similar to 1 day prior. Suspect combination of alveolar edema and pneumonia.  -Small pleural effusions noted. Pulmonary vascular  congestion present.    I have personally reviewed and interpreted all radiology studies and my findings are as above.  VENTILATOR SETTINGS: Mode: PRVC Vt set 440 set rate: 30 FiO2 35% PEEP: 5    Cultures 4/3 blood RIGHT hand negative x2 4/5 MRSA by PCR negative 4/7 tracheal aspirate normal flora 4/15 urine negative 4/15 blood positive STAPHYLOCOCCUS EPIDERMIDIS 2/2 4/18 blood RIGHT hand NGTD 4/18 blood A-line NGTD    Antimicrobials: Anti-infectives (From admission, onward)   Start     Stop   06/07/18 1500  vancomycin (VANCOCIN) 1,500 mg in sodium chloride 0.9 % 500 mL IVPB         06/06/18 1500  vancomycin (VANCOCIN) 2,000 mg in sodium chloride 0.9 % 500 mL IVPB     06/06/18 1700   05/27/18 2200  ceFEPIme (MAXIPIME) 2 g in sodium chloride 0.9 % 100 mL IVPB  Status:  Discontinued     05/27/18 1400   05/27/18 1200  ceFEPIme (MAXIPIME) 2 g in sodium chloride 0.9 % 100 mL IVPB     05/27/18 1330   05/26/18 2200  azithromycin (ZITHROMAX) tablet 500 mg  Status:  Discontinued     05/26/18 1502   05/26/18 2200  azithromycin (ZITHROMAX) tablet 500 mg     05/27/18 2141   05/24/18 2200  hydroxychloroquine (PLAQUENIL) tablet 200 mg     05/28/18 1025   05/23/18 2300  azithromycin (ZITHROMAX) 500 mg in sodium chloride 0.9 % 250 mL IVPB  Status:  Discontinued     05/26/18 0715   05/23/18 2200  hydroxychloroquine (PLAQUENIL) tablet 400 mg     05/24/18 1114   05/23/18 1500  vancomycin (VANCOCIN) 1,250 mg in sodium chloride 0.9 % 250 mL IVPB  Status:  Discontinued     05/25/18 1141   05/23/18 0230  ceFEPIme (MAXIPIME) 2 g in sodium chloride 0.9 % 100 mL IVPB  Status:  Discontinued     05/25/18 1334   05/23/2018 1545  metroNIDAZOLE (FLAGYL) IVPB 500 mg     05/21/2018 1731   05/21/2018 1530  vancomycin (VANCOCIN) IVPB 1000 mg/200 mL premix     06/04/2018 1731   05/26/2018 1415  vancomycin (VANCOCIN) IVPB 1000 mg/200 mL premix     05/27/2018 1538   06/16/2018 1415  ceFEPIme (MAXIPIME) 2 g in  sodium chloride 0.9 % 100 mL IVPB     05/29/2018 1509       Devices #7.5 ETT 4/4>>>   LINES / TUBES:      Continuous Infusions: . sodium chloride Stopped (06/08/18 1743)  . ampicillin-sulbactam (UNASYN) IV 3 g (06/11/18 1443)  . dexmedetomidine (PRECEDEX) IV infusion Stopped (06/09/18 1558)  . feeding supplement (OSMOLITE 1.2 CAL) 1,000 mL (06/10/18 1137)  . norepinephrine (LEVOPHED) Adult infusion 4 mcg/min (06/11/18 0713)     Objective: Vitals:   06/11/18 0900 06/11/18 1000 06/11/18 1100 06/11/18 1200  BP: '91/74 93/81 91/71 '$ (!) 87/63  Pulse: 90 79 78 80  Resp: (!) 24 (!) 30 (!) 0 11  Temp:    99 F (37.2 C)  TempSrc:      SpO2: 98% 99% 99% 98%  Weight:      Height:        Intake/Output Summary (Last  24 hours) at 06/11/2018 1501 Last data filed at 06/11/2018 1400 Gross per 24 hour  Intake 3839.66 ml  Output 1650 ml  Net 2189.66 ml   Filed Weights   06/09/18 0509 06/10/18 0500 06/11/18 0500  Weight: 126.8 kg 130.7 kg 133.8 kg   General exam: awake, intubated, does not follow commands or answer questions Respiratory system: Clear to auscultation. Respiratory effort normal. Cardiovascular system:RRR. No murmurs, rubs, gallops. Gastrointestinal system: Abdomen is nondistended, soft and nontender. No organomegaly or masses felt. Normal bowel sounds heard. Central nervous system: awake, does not participate with exam Extremities: 1+ edema bilaterally Skin: No rashes, lesions or ulcers Psychiatry: unable to assess due to mental status and intubation     Data Reviewed: Care during the described time interval was provided by me .  I have reviewed this patient's available data, including medical history, events of note, physical examination, and all test results as part of my evaluation.   CBC: Recent Labs  Lab 06/05/18 0500  06/06/18 0235  06/07/18 0310 06/08/18 0310 06/08/18 1629 06/09/18 0255 06/09/18 1820 06/10/18 0500 06/11/18 0500  WBC 8.0  --  7.8   --  8.8 7.1  --  11.0*  --  12.1* 10.7*  NEUTROABS 6.2  --  6.0  --   --   --   --   --   --   --   --   HGB 8.3*   < > 7.6*   < > 8.5* 7.6* 8.8* 6.7* 8.0* 8.0* 8.1*  HCT 30.0*   < > 26.9*   < > 29.7* 27.7* 26.0* 24.0* 27.5* 28.7* 28.7*  MCV 92.6  --  92.4  --  91.7 91.7  --  93.0  --  92.9 92.9  PLT 314  --  280  --  324 271  --  216  --  265 251   < > = values in this interval not displayed.   Basic Metabolic Panel: Recent Labs  Lab 06/07/18 0310 06/08/18 0310 06/08/18 1629 06/09/18 0255 06/10/18 0500 06/11/18 0500  NA 141 144 146* 144 144 142  K 3.6 3.8 3.6 3.7 3.3* 3.8  CL 104 108  --  114* 110 109  CO2 28 28  --  '22 26 27  '$ GLUCOSE 121* 97  --  136* 113* 115*  BUN 34* 29*  --  27* 33* 31*  CREATININE 1.08* 0.93  --  0.86 0.93 0.90  CALCIUM 7.6* 7.5*  --  6.6* 7.4* 7.4*  MG 2.5* 2.5*  --  1.9 2.5* 2.4   GFR: Estimated Creatinine Clearance: 88 mL/min (by C-G formula based on SCr of 0.9 mg/dL). Liver Function Tests: Recent Labs  Lab 06/05/18 0500 06/06/18 0235  AST 23 31  ALT 14 17  ALKPHOS 82 109  BILITOT 0.9 1.1  PROT 4.7* 6.3*  ALBUMIN 1.7* 2.1*   No results for input(s): LIPASE, AMYLASE in the last 168 hours. No results for input(s): AMMONIA in the last 168 hours. Coagulation Profile: No results for input(s): INR, PROTIME in the last 168 hours. Cardiac Enzymes: No results for input(s): CKTOTAL, CKMB, CKMBINDEX, TROPONINI in the last 168 hours. BNP (last 3 results) No results for input(s): PROBNP in the last 8760 hours. HbA1C: No results for input(s): HGBA1C in the last 72 hours. CBG: Recent Labs  Lab 06/10/18 1955 06/10/18 2342 06/11/18 0358 06/11/18 0751 06/11/18 1207  GLUCAP 112* 117* 112* 107* 114*   Lipid Profile: No results for input(s): CHOL, HDL,  LDLCALC, TRIG, CHOLHDL, LDLDIRECT in the last 72 hours. Thyroid Function Tests: No results for input(s): TSH, T4TOTAL, FREET4, T3FREE, THYROIDAB in the last 72 hours. Anemia Panel: No results for  input(s): VITAMINB12, FOLATE, FERRITIN, TIBC, IRON, RETICCTPCT in the last 72 hours. Urine analysis:    Component Value Date/Time   COLORURINE YELLOW 06/03/2018 1347   APPEARANCEUR CLEAR 06/03/2018 1347   APPEARANCEUR Cloudy (A) 03/04/2018 1123   LABSPEC 1.019 06/03/2018 1347   PHURINE 8.0 06/03/2018 1347   GLUCOSEU NEGATIVE 06/03/2018 1347   HGBUR NEGATIVE 06/03/2018 1347   BILIRUBINUR NEGATIVE 06/03/2018 1347   BILIRUBINUR Negative 03/04/2018 1123   KETONESUR NEGATIVE 06/03/2018 1347   PROTEINUR 30 (A) 06/03/2018 1347   NITRITE NEGATIVE 06/03/2018 1347   LEUKOCYTESUR NEGATIVE 06/03/2018 1347   Sepsis Labs: '@LABRCNTIP'$ (procalcitonin:4,lacticidven:4)  ) Recent Results (from the past 240 hour(s))  Culture, Urine     Status: None   Collection Time: 06/03/18  1:48 PM  Result Value Ref Range Status   Specimen Description   Final    URINE, CATHETERIZED Performed at [facility] [address], [place], [place] [address]    Special Requests   Final    NONE Performed at [facility], [place] [address], [place], [place] [address]    Culture   Final    NO GROWTH Performed at [facility], [place] [address], [place], [place] [address]    Report Status 06/04/2018 FINAL  Final  Culture, blood (routine x 2)     Status: Abnormal   Collection Time: 06/03/18  6:15 PM  Result Value Ref Range Status   Specimen Description   Final    BLOOD BLOOD RIGHT HAND Performed at [facility] [address] [place], [place] [address]    Special Requests   Final    BOTTLES DRAWN AEROBIC AND ANAEROBIC Blood Culture adequate volume Performed at [facility] [address] [place], [place] [address]    Culture  Setup Time   Final    ANAEROBIC BOTTLE ONLY GRAM POSITIVE COCCI CRITICAL RESULT CALLED TO, READ BACK BY AND VERIFIED WITH: J.MIZE,RN AT 0022 ON 06/05/18 BY G.[email or web address] Performed at [facility], [place]  [address], [place], [place] [address]    Culture STAPHYLOCOCCUS EPIDERMIDIS (A)  Final   Report Status 06/06/2018 FINAL  Final   Organism ID, Bacteria STAPHYLOCOCCUS EPIDERMIDIS  Final      Susceptibility   Staphylococcus epidermidis - MIC*    CIPROFLOXACIN <=0.5 SENSITIVE Sensitive     ERYTHROMYCIN >=8 RESISTANT Resistant     GENTAMICIN <=0.5 SENSITIVE Sensitive     OXACILLIN >=4 RESISTANT Resistant     TETRACYCLINE >=16 RESISTANT Resistant     VANCOMYCIN 2 SENSITIVE Sensitive     TRIMETH/SULFA <=10 SENSITIVE Sensitive     CLINDAMYCIN <=0.25 SENSITIVE Sensitive     RIFAMPIN <=0.5 SENSITIVE Sensitive     Inducible Clindamycin NEGATIVE Sensitive     * STAPHYLOCOCCUS EPIDERMIDIS  Blood Culture ID Panel (Reflexed)     Status: Abnormal   Collection Time: 06/03/18  6:15 PM  Result Value Ref Range Status   Enterococcus species NOT DETECTED NOT DETECTED Final   Listeria monocytogenes NOT DETECTED NOT DETECTED Final   Staphylococcus species DETECTED (A) NOT DETECTED Final    Comment: Methicillin (oxacillin) resistant coagulase negative staphylococcus. Possible blood culture contaminant (unless isolated from more than one blood culture draw or clinical case suggests pathogenicity). No antibiotic treatment is indicated for  blood  culture contaminants. CRITICAL RESULT CALLED TO, READ BACK BY AND VERIFIED WITH: J.MIZE,RN AT 0022 ON 06/05/18 BY G.MCADOO    Staphylococcus aureus (BCID) NOT DETECTED NOT DETECTED Final   Methicillin resistance DETECTED (A) NOT DETECTED Final    Comment: CRITICAL RESULT CALLED TO, READ BACK BY AND VERIFIED WITH: J.MIZE,RN AT 0022 ON 06/05/18 BY G.MCADOO    Streptococcus species NOT DETECTED NOT DETECTED Final   Streptococcus agalactiae NOT DETECTED NOT DETECTED Final   Streptococcus pneumoniae NOT DETECTED NOT DETECTED Final   Streptococcus pyogenes NOT DETECTED NOT DETECTED Final   Acinetobacter baumannii NOT DETECTED NOT DETECTED Final   Enterobacteriaceae species NOT  DETECTED NOT DETECTED Final   Enterobacter cloacae complex NOT DETECTED NOT DETECTED Final   Escherichia coli NOT DETECTED NOT DETECTED Final   Klebsiella oxytoca NOT DETECTED NOT DETECTED Final   Klebsiella pneumoniae NOT DETECTED NOT DETECTED Final   Proteus species NOT DETECTED NOT DETECTED Final   Serratia marcescens NOT DETECTED NOT DETECTED Final   Haemophilus influenzae NOT DETECTED NOT DETECTED Final   Neisseria meningitidis NOT DETECTED NOT DETECTED Final   Pseudomonas aeruginosa NOT DETECTED NOT DETECTED Final   Candida albicans NOT DETECTED NOT DETECTED Final   Candida glabrata NOT DETECTED NOT DETECTED Final   Candida krusei NOT DETECTED NOT DETECTED Final   Candida parapsilosis NOT DETECTED NOT DETECTED Final   Candida tropicalis NOT DETECTED NOT DETECTED Final    Comment: Performed at  [facility], [place] [address], [place], [place] [address]  Culture, blood (routine x 2)     Status: Abnormal   Collection Time: 06/03/18  6:30 PM  Result Value Ref Range Status   Specimen Description   Final    BLOOD BLOOD RIGHT WRIST Performed at [facility] [address] [place], [place] [address]    Special Requests   Final    BOTTLES DRAWN AEROBIC ONLY Blood Culture adequate volume Performed at [facility] [address] [place], [place] [address]    Culture  Setup Time   Final    AEROBIC BOTTLE ONLY GRAM POSITIVE COCCI CRITICAL VALUE NOTED.  VALUE IS CONSISTENT WITH PREVIOUSLY REPORTED AND CALLED VALUE.    Culture (A)  Final    STAPHYLOCOCCUS EPIDERMIDIS SUSCEPTIBILITIES PERFORMED ON PREVIOUS CULTURE WITHIN THE LAST 5 DAYS. Performed at [facility], [place] [address], [place], [place] [address]    Report Status 06/07/2018 FINAL  Final  Culture, blood (Routine X 2) w Reflex to ID Panel     Status: None   Collection Time: 06/06/18  1:43 PM  Result Value Ref Range Status   Specimen Description   Final    BLOOD RIGHT HAND  Performed at [facility] [address], [place], [place] [address]    Special Requests   Final    BOTTLES DRAWN AEROBIC ONLY Blood Culture results may not be optimal due to an inadequate volume of blood received in culture bottles Performed at [facility] [address] [place], [place] [address]    Culture   Final    NO GROWTH 5 DAYS Performed at [facility], [place] [address], [place], [place] [address]    Report Status 06/11/2018 FINAL  Final  Culture, blood (Routine X 2) w Reflex to ID Panel     Status: None   Collection Time: 06/06/18  1:48 PM  Result Value Ref Range Status   Specimen Description  Final    BLOOD A-LINE Performed at [facility], [place] [address] [place], [place] [address]    Special Requests   Final    BOTTLES DRAWN AEROBIC ONLY Blood Culture adequate volume Performed at [facility] [address], [place], [place] [address]    Culture   Final    NO GROWTH 5 DAYS Performed at [facility], [place] [address], [place],  [address]    Report Status 06/11/2018 FINAL  Final         Radiology Studies: No results found.      Scheduled Meds: . sodium chloride   Intravenous Once  . alteplase  2 mg Intracatheter Once  . atorvastatin  40 mg Per Tube q1800  . chlorhexidine gluconate (MEDLINE KIT)  15 mL Mouth Rinse BID  . Chlorhexidine Gluconate Cloth  6 each Topical Daily  . clopidogrel  75 mg Per Tube Daily  . docusate  50 mg Oral BID  . enoxaparin (LOVENOX) injection  60 mg Subcutaneous Q24H  . feeding supplement (PRO-STAT SUGAR FREE 64)  60 mL Per Tube TID WC  . fentaNYL  1 patch Transdermal Q72H  . free water  200 mL Per Tube Q6H  . insulin aspart  0-15 Units Subcutaneous Q4H  . mouth rinse  15 mL Mouth Rinse 10 times per day  . metoprolol tartrate  12.5 mg Per Tube BID  . pantoprazole sodium  40 mg Per Tube Daily  . sennosides  5 mL Oral QHS  . silver  sulfADIAZINE  1 application Topical Daily  . sodium chloride flush  10-40 mL Intracatheter Q12H  . sodium chloride flush  3 mL Intravenous Q12H  . venlafaxine  37.5 mg Per Tube QHS  . venlafaxine  75 mg Per Tube q morning - 10a   Continuous Infusions: . sodium chloride Stopped (06/08/18 1743)  . ampicillin-sulbactam (UNASYN) IV 3 g (06/11/18 1443)  . dexmedetomidine (PRECEDEX) IV infusion Stopped (06/09/18 1558)  . feeding supplement (OSMOLITE 1.2 CAL) 1,000 mL (06/10/18 1137)  . norepinephrine (LEVOPHED) Adult infusion 4 mcg/min (06/11/18 0713)     LOS: 20 days   The patient is critically ill with multiple organ systems failure and requires high complexity decision making for assessment and support, frequent evaluation and titration of therapies, application of advanced monitoring technologies and extensive interpretation of multiple databases. Critical Care Time devoted to patient care services described in this note  Time spent: 40 minutes     Kathie Dike, MD [facility]  If 7PM-7AM, please contact night-coverage [email or web address]  06/11/2018, 3:01 PM

## 2018-06-11 NOTE — Progress Notes (Signed)
Pharmacy Antibiotic Note  Shannon Carroll is a 68 y.o. female admitted on 05/20/2018 with COVID 19 infection.  Blood cultures are positive for MRSE; ID recommends treatment. Pharmacy has been consulted for vancomycin dosing.  Pharmacy was also consulted to add Unasyn for aspiration pneumonia coverage.  D 6 Vancomycin D 3 Unasyn Tm 101.1 WBC 10.7 SCr 0.9  Plan:  Continue Unasyn 3g IV q6h  Decrease to Vancomycin 1g IV q24h.  Goal AUC = 400 - 500.  Expected AUC 524  Follow up renal function, culture results, and clinical course.   Height: 5\' 8"  (172.7 cm) Weight: 294 lb 15.6 oz (133.8 kg) IBW/kg (Calculated) : 63.9  Temp (24hrs), Avg:99.8 F (37.7 C), Min:98 F (36.7 C), Max:101.1 F (38.4 C)  Recent Labs  Lab 06/07/18 0310 06/08/18 0310 06/09/18 0255 06/10/18 0500 06/10/18 1848 06/11/18 0500  WBC 8.8 7.1 11.0* 12.1*  --  10.7*  CREATININE 1.08* 0.93 0.86 0.93  --  0.90  VANCOPEAK  --   --   --   --  53*  --     Estimated Creatinine Clearance: 88 mL/min (by C-G formula based on SCr of 0.9 mg/dL).    Allergies  Allergen Reactions  . Lisinopril Cough   Antimicrobials this admission:  4/4 Hydroxy>> (4/9)  4/4 Azithro >> (4/8) 4/3 Vanc >>4/6, 4/18 >> 4/3 Cefepime >>4/6, restarted 4/8>>4/9 4/3 Flagyl x 1  4/20 Unasyn >>   Dose adjustments this admission:  4/22 VP = 53, 4/23 VT = 16, calculated AUC 786.  Vancomycin dose decreased.  Microbiology results:  4/5 mrsa pcr: neg 4/3 blood cx: neg 4/3 Covid: positive  4/7 Resp asp: G+ cocci and G-rods; normal flora  4/15 UCx: NGF  4/15 BCx 2/4 staph epi (MRSE on BCID; 1/2 from each set) 4/18 BCx: ngtd    Thank you for allowing pharmacy to be a part of this patient's care.  Gretta Arab PharmD, BCPS 06/11/2018 11:47 AM

## 2018-06-11 NOTE — Progress Notes (Signed)
Spoke to son Mitzi Hansen this morning with update regarding Miss Shannon Carroll. He spoke about terminal extubation plans. He does not want his mom to suffer anymore. He health has been declining greatly over last 4 months per son. I gave assurance of our care and love for his mom. He is Patent attorney. Will update this afternoon.

## 2018-06-11 NOTE — Progress Notes (Signed)
NAME:  Shannon Carroll, MRN:  [id number], DOB:  May 06, 1950, LOS: 104 ADMISSION DATE:  05/23/2018, CONSULTATION DATE:  05/23/2018 REFERRING MD:  Glenna Durand, CHIEF COMPLAINT:  SVT hpoxemia, COVID 45 POS   Brief History   68 yr old female with PMHx HFpEF, COPD, admitted 4/3 with COVID pneumonia Course complicated by prolonged ventilation and episode of aspiration 4/20  Significant Hospital Events   4/3 + COVID -19  4/4 ICU admit intubated  4/12 weaning trial, failed, de-sat, SVT  4/15 episodes of SVT 4/20 aspiration episode  Consults:  PCCM   Procedures:  ETT- 4/4 >> PICC LUE 4/11 >>  Significant Diagnostic Tests:  Bilateral alveolar infiltrates on CXR  4/18 CT head > stable R frontal infarct, NAICP  Micro Data:  Blood cx 06/10/2018- NGTD Novel Coronavirus Positive result called 4/4. Tracheal aspirate 4/7>>> moderate WBC (abundant GPC and GNR)   4/15 blood > staph epidermidis 2/4 bottles 4/15 urine > neg 4/18 blood > ng   Antimicrobials:  Vancomycin- 05/23/2018 Cefepime- 05/23/2018-4/6, 4/8>>  Hydroxychloroquine- upon transfer to ICU -COVID (to end 4/9) Axithromycin - upon transfer to ICU- COVID (to end 4/8)  4/18 vancomycin >> off 4/20 unasyn >>  Interim history/subjective:   Febrile 101 Did not tolerate weaning this a.m. Urine output low but adequate Remains on low-dose Levophed  Objective   Blood pressure 93/81, pulse 79, temperature (!) 101 F (38.3 C), temperature source Oral, resp. rate (!) 30, height 5\' 8"  (1.727 m), weight 133.8 kg, SpO2 99 %.    Vent Mode: PRVC FiO2 (%):  [30 %-35 %] 35 % Set Rate:  [30 bmp] 30 bmp Vt Set:  [440 mL] 440 mL PEEP:  [5 cmH20] 5 cmH20 Plateau Pressure:  [24 cmH20-26 cmH20] 26 cmH20   Intake/Output Summary (Last 24 hours) at 06/11/2018 1039 Last data filed at 06/11/2018 0900 Gross per 24 hour  Intake 4284.69 ml  Output 1025 ml  Net 3259.69 ml   Filed Weights   06/09/18 0509 06/10/18 0500 06/11/18 0500  Weight: 126.8 kg 130.7  kg 133.8 kg   Examination:   General:  Chronically ill appearing female, no acute distress Mild pallor, no icterus No JVD Decreased breath sounds bilateral S1-S2 regular, sinus on monitor Soft nontender abdomen, tube feeds running Awake and follows one-step commands 2+ edema  Chest x-ray from 4/21 personally reviewed which shows bilateral airspace disease and effusions     Resolved Hospital Problem list   Transaminits.  Septic shock  Assessment & Plan:   Acute respiratory failure:  -Spontaneous breathing trials -Goal would be one-way extubation if she meets criteria  Septic shock:  - Levophed down to 6 mcg for MAP goal of 65 mmHg --DC IV fluids and will need diuretics once off pressors  Sedation:  - Minimize sedation as able (precedex and PRN fentanyl)             -Continue fentanyl patch  COVID pneumonia:  -Was treated with hydroxychloroquine and azithromycin Aspiration pneumonia-empiric Unasyn, obtain respiratory culture for completion Staph epidermidis bacteremia-likely contaminant since repeat cultures negative  Goals of care - No tracheostomy or LTAC per prior discussions, full medical care, once extubated then DNR.  Best practice:  Diet:tube feed Pain/Anxiety/Delirium protocol (if indicated): Continuous infusion VAP protocol (if indicated): NA DVT prophylaxis: SCD, Lovenox GI prophylaxis: Protonix Glucose control: sliding scale Mobility: bed bound Code Status: Partial code - NO CPR Family Communication: husband updated 4/23 Disposition: ICU   Labs    Reviewed, normal electrolytes, stable  anemia, improved leukocytosis   The patient is critically ill with multiple organ systems failure and requires high complexity decision making for assessment and support, frequent evaluation and titration of therapies, application of advanced monitoring technologies and extensive interpretation of multiple databases. Critical Care Time devoted to patient care services  described in this note independent of APP/resident  time is 32 minutes.   Kara Mead MD. Shade Flood. [facility] Pager [phone] If no response call [phone]   06/11/2018

## 2018-06-12 ENCOUNTER — Inpatient Hospital Stay (HOSPITAL_COMMUNITY): Payer: Medicare Other

## 2018-06-12 LAB — BASIC METABOLIC PANEL
Anion gap: 8 (ref 5–15)
BUN: 28 mg/dL — ABNORMAL HIGH (ref 8–23)
CO2: 27 mmol/L (ref 22–32)
Calcium: 7.2 mg/dL — ABNORMAL LOW (ref 8.9–10.3)
Chloride: 107 mmol/L (ref 98–111)
Creatinine, Ser: 0.92 mg/dL (ref 0.44–1.00)
GFR calc Af Amer: >60 mL/min (ref >60)
GFR calc non Af Amer: >60 mL/min (ref >60)
Glucose, Bld: 136 mg/dL — ABNORMAL HIGH (ref 70–99)
Potassium: 3.5 mmol/L (ref 3.5–5.1)
Sodium: 142 mmol/L (ref 135–145)

## 2018-06-12 LAB — CBC
HCT: 27.6 % — ABNORMAL LOW (ref 36.0–46.0)
Hemoglobin: 7.7 g/dL — ABNORMAL LOW (ref 12.0–15.0)
MCH: 25.7 pg — ABNORMAL LOW (ref 26.0–34.0)
MCHC: 27.9 g/dL — ABNORMAL LOW (ref 30.0–36.0)
MCV: 92 fL (ref 80.0–100.0)
Platelets: 229 10*3/uL (ref 150–400)
RBC: 3 MIL/uL — ABNORMAL LOW (ref 3.87–5.11)
RDW: 21.1 % — ABNORMAL HIGH (ref 11.5–15.5)
WBC: 6.9 10*3/uL (ref 4.0–10.5)
nRBC: 0 % (ref 0.0–0.2)

## 2018-06-12 LAB — PHOSPHORUS: Phosphorus: 3.5 mg/dL (ref 2.5–4.6)

## 2018-06-12 LAB — GLUCOSE, CAPILLARY
Glucose-Capillary: 133 mg/dL — ABNORMAL HIGH (ref 70–99)
Glucose-Capillary: 116 mg/dL — ABNORMAL HIGH (ref 70–99)
Glucose-Capillary: 113 mg/dL — ABNORMAL HIGH (ref 70–99)
Glucose-Capillary: 119 mg/dL — ABNORMAL HIGH (ref 70–99)
Glucose-Capillary: 130 mg/dL — ABNORMAL HIGH (ref 70–99)
Glucose-Capillary: 130 mg/dL — ABNORMAL HIGH (ref 70–99)
Glucose-Capillary: 143 mg/dL — ABNORMAL HIGH (ref 70–99)

## 2018-06-12 LAB — MAGNESIUM: Magnesium: 2.2 mg/dL (ref 1.7–2.4)

## 2018-06-12 MED ORDER — POTASSIUM CHLORIDE 20 MEQ/15ML (10%) PO SOLN
40.0000 meq | Freq: Once | ORAL | Status: AC
  Administered 2018-06-12: 40 meq
  Filled 2018-06-12: qty 30

## 2018-06-12 MED ORDER — FUROSEMIDE 10 MG/ML IJ SOLN
40.0000 mg | Freq: Every day | INTRAMUSCULAR | Status: DC
  Administered 2018-06-12 – 2018-06-15 (×4): 40 mg via INTRAVENOUS
  Filled 2018-06-12 (×4): qty 4

## 2018-06-12 NOTE — Progress Notes (Signed)
Spoke with pt son Shannon Carroll via face time with Mrs Shannon Carroll alert (eyes open and tracking) the screen to her sons voice. reveiwed her night , morning and plans for the day. Family is supportive and comforting to their Mom during this time. They expressed need for extubation soon for comfort. This will be further discussed with CCM. My care is ongoing and luving with Mrs Shannon Carroll. Family is thankful and will talk again this evening if they wish so.

## 2018-06-12 NOTE — Progress Notes (Signed)
PROGRESS NOTE    Shannon Carroll  WNU:[id number] DOB: [birth year] DOA: 05/21/2018 PCP: Harlan Stains, MD   Brief Narrative:  68 yr WF PMHx Chronic Diastolic (EF 03%), COPD, CVA, SVT, essential hypertension, obstructive sleep apnea, morbid obesity,   Presented to the hospital with acute shortness of breath was diagnosed with COVID-19 infection, went into acute hypoxic respiratory failure intubated and kept in ICU by pulmonary critical care. Transferred to [facility] ICU unit on 06/01/2018.     Subjective: Awake, has eyes open, does not respond to questions or follow commands    Assessment & Plan:   Principal Problem:   Multifocal pneumonia Active Problems:   Sleep apnea   Shortness of breath   Acute on chronic diastolic CHF (congestive heart failure) (HCC)   Hypertension   Stroke (HCC)   CKD (chronic kidney disease), stage III (HCC)   Hypokalemia   Abnormal LFTs   Acute respiratory failure (HCC)   Elevated troponin   SVT (supraventricular tachycardia) (HCC)   Acute on chronic respiratory failure (HCC)   Dementia, vascular (HCC)   ARDS (adult respiratory distress syndrome) ([place])   COVID-19 virus infection   Aspiration pneumonia of both lower lobes due to gastric secretions (HCC)   Acute respiratory failure with hypoxia/COVID-19 pneumonia -PCCM managing, weaning trials per critical care - Completed treatment with hydroxychloroquine and azithromycin  Aspiration pneumonia - 4/20 patient had witnessed aspiration - 4/21 PCXR consistent with aspiration pneumonia.: -4/22 T-max last 24 hours 37.8 C - Complete 7-day course of antibiotics, currently on unasyn  Septic shock - Patient back on Levophed to maintain adequate perfusion pressure. - Continues on low dose Levophed to maintain MAP>65   Staph Epidermidis Bacteremia  -Secondary to contaminated PICC line?  Case discussed by Dr. Sherral Hammers with Dr. Prince Rome from ID who recommended treating with vancomycin through  PICC line and repeating blood cultures.  If repeat blood cultures negative then could leave PICC line in. If positive, PICC line will need to be removed -Initial Blood cultures positive for staph epidermidis 2/2 -Repeat cultures have shown no growth  Acute renal failure -Resolved - Monitor creatinine level Recent Labs  Lab 06/08/18 0310 06/09/18 0255 06/10/18 0500 06/11/18 0500 06/12/18 0135  CREATININE 0.93 0.86 0.93 0.90 0.92    Iron deficiency anemia, acute on chronic - Likely related to critical illness - Fecal occult pending Recent Labs  Lab 06/09/18 0255 06/09/18 1820 06/10/18 0500 06/11/18 0500 06/12/18 0135  HGB 6.7* 8.0* 8.0* 8.1* 7.7*  - 4/21 transfused 1 unit PRBC  Chronic diastolic CHF -Strict in and out +18.7 L -Daily weight.   Filed Weights   06/10/18 0500 06/11/18 0500 06/12/18 0428  Weight: 130.7 kg 133.8 kg 134.1 kg  -started on intravenous lasix  SVT -Short burst SVT during CPAP on 4/14 resolved with beta-blockers continue as needed -Recent TTE shows EF of 65% with no WMA   Morbid obesity   OSA -Currently vented  Diabetes type 2 controlled with complication - 4/74 hemoglobin A1c= 5.2  HLD - LDL not within AHA/ADA guidelines - 4/20 increase Lipitor to 40 mg daily  Nutrition -Continue tube feeds   Hx CVA - Plavix - ASA 81 mg    Acute metabolic encephalopathy -Underlying dementia - Recent CVA x2  -Encephalopathy appears to be resolving.      DVT prophylaxis: Lovenox Code Status: DNR Family Communication: updated husband over the phone Disposition Plan: TBD   Consultants:  PCCM    Procedures/Significant Events:  4/21  PCXR-tube and catheter positions as described without pneumothorax. -Widespread airspace opacity bilaterally, similar to 1 day prior. Suspect combination of alveolar edema and pneumonia.  -Small pleural effusions noted. Pulmonary vascular congestion present.    I have personally reviewed and interpreted  all radiology studies and my findings are as above.  VENTILATOR SETTINGS: Mode: PRVC Vt set 440 set rate: 30 FiO2 35% PEEP: 5    Cultures 4/3 blood RIGHT hand negative x2 4/5 MRSA by PCR negative 4/7 tracheal aspirate normal flora 4/15 urine negative 4/15 blood positive STAPHYLOCOCCUS EPIDERMIDIS 2/2 4/18 blood RIGHT hand NGTD 4/18 blood A-line NGTD    Antimicrobials: Anti-infectives (From admission, onward)   Start     Stop   06/07/18 1500  vancomycin (VANCOCIN) 1,500 mg in sodium chloride 0.9 % 500 mL IVPB         06/06/18 1500  vancomycin (VANCOCIN) 2,000 mg in sodium chloride 0.9 % 500 mL IVPB     06/06/18 1700   05/27/18 2200  ceFEPIme (MAXIPIME) 2 g in sodium chloride 0.9 % 100 mL IVPB  Status:  Discontinued     05/27/18 1400   05/27/18 1200  ceFEPIme (MAXIPIME) 2 g in sodium chloride 0.9 % 100 mL IVPB     05/27/18 1330   05/26/18 2200  azithromycin (ZITHROMAX) tablet 500 mg  Status:  Discontinued     05/26/18 1502   05/26/18 2200  azithromycin (ZITHROMAX) tablet 500 mg     05/27/18 2141   05/24/18 2200  hydroxychloroquine (PLAQUENIL) tablet 200 mg     05/28/18 1025   05/23/18 2300  azithromycin (ZITHROMAX) 500 mg in sodium chloride 0.9 % 250 mL IVPB  Status:  Discontinued     05/26/18 0715   05/23/18 2200  hydroxychloroquine (PLAQUENIL) tablet 400 mg     05/24/18 1114   05/23/18 1500  vancomycin (VANCOCIN) 1,250 mg in sodium chloride 0.9 % 250 mL IVPB  Status:  Discontinued     05/25/18 1141   05/23/18 0230  ceFEPIme (MAXIPIME) 2 g in sodium chloride 0.9 % 100 mL IVPB  Status:  Discontinued     05/25/18 1334   05/31/2018 1545  metroNIDAZOLE (FLAGYL) IVPB 500 mg     05/31/2018 1731   06/09/2018 1530  vancomycin (VANCOCIN) IVPB 1000 mg/200 mL premix     06/10/2018 1731   06/03/2018 1415  vancomycin (VANCOCIN) IVPB 1000 mg/200 mL premix     06/13/2018 1538   06/05/2018 1415  ceFEPIme (MAXIPIME) 2 g in sodium chloride 0.9 % 100 mL IVPB     05/28/2018 1509        Devices #7.5 ETT 4/4>>>   LINES / TUBES:      Continuous Infusions:  sodium chloride Stopped (06/08/18 1743)   ampicillin-sulbactam (UNASYN) IV 3 g (06/12/18 0700)   feeding supplement (OSMOLITE 1.2 CAL) 40 mL/hr at 06/12/18 0600   norepinephrine (LEVOPHED) Adult infusion 2 mcg/min (06/12/18 0600)   vancomycin Stopped (06/11/18 2004)     Objective: Vitals:   06/12/18 0733 06/12/18 0740 06/12/18 0800 06/12/18 0807  BP: 94/69     Pulse: 94     Resp: (!) 30     Temp:   98.6 F (37 C)   TempSrc:      SpO2: 98% 96%  96%  Weight:      Height:        Intake/Output Summary (Last 24 hours) at 06/12/2018 1044 Last data filed at 06/12/2018 0600 Gross per 24 hour  Intake 1463.13 ml  Output 1665 ml  Net -201.87 ml   Filed Weights   06/10/18 0500 06/11/18 0500 06/12/18 0428  Weight: 130.7 kg 133.8 kg 134.1 kg   General exam: Awakes up to voice, intubated Respiratory system: Clear to auscultation. Respiratory effort normal. Cardiovascular system:RRR. No murmurs, rubs, gallops. Gastrointestinal system: Abdomen is nondistended, soft and nontender. No organomegaly or masses felt. Normal bowel sounds heard. Central nervous system: awake, does not participate in exam. Extremities: 1+ edema bilaterally Skin: No rashes, lesions or ulcers Psychiatry: unable to assess due to mental status.    Data Reviewed: Care during the described time interval was provided by me .  I have reviewed this patient's available data, including medical history, events of note, physical examination, and all test results as part of my evaluation.   CBC: Recent Labs  Lab 06/06/18 0235  06/08/18 0310  06/09/18 0255 06/09/18 1820 06/10/18 0500 06/11/18 0500 06/12/18 0135  WBC 7.8   < > 7.1  --  11.0*  --  12.1* 10.7* 6.9  NEUTROABS 6.0  --   --   --   --   --   --   --   --   HGB 7.6*   < > 7.6*   < > 6.7* 8.0* 8.0* 8.1* 7.7*  HCT 26.9*   < > 27.7*   < > 24.0* 27.5* 28.7* 28.7* 27.6*   MCV 92.4   < > 91.7  --  93.0  --  92.9 92.9 92.0  PLT 280   < > 271  --  216  --  265 251 229   < > = values in this interval not displayed.   Basic Metabolic Panel: Recent Labs  Lab 06/08/18 0310 06/08/18 1629 06/09/18 0255 06/10/18 0500 06/11/18 0500 06/12/18 0135  NA 144 146* 144 144 142 142  K 3.8 3.6 3.7 3.3* 3.8 3.5  CL 108  --  114* 110 109 107  CO2 28  --  _0 GLUCOSE 97  --  136* 113* 115* 136*  BUN 29*  --  27* 33* 31* 28*  CREATININE 0.93  --  0.86 0.93 0.90 0.92  CALCIUM 7.5*  --  6.6* 7.4* 7.4* 7.2*  MG 2.5*  --  1.9 2.5* 2.4 2.2  PHOS  --   --   --   --   --  3.5   GFR: Estimated Creatinine Clearance: 86.2 mL/min (by C-G formula based on SCr of 0.92 mg/dL). Liver Function Tests: Recent Labs  Lab 06/06/18 0235  AST 31  ALT 17  ALKPHOS 109  BILITOT 1.1  PROT 6.3*  ALBUMIN 2.1*   No results for input(s): LIPASE, AMYLASE in the last 168 hours. No results for input(s): AMMONIA in the last 168 hours. Coagulation Profile: No results for input(s): INR, PROTIME in the last 168 hours. Cardiac Enzymes: No results for input(s): CKTOTAL, CKMB, CKMBINDEX, TROPONINI in the last 168 hours. BNP (last 3 results) No results for input(s): PROBNP in the last 8760 hours. HbA1C: No results for input(s): HGBA1C in the last 72 hours. CBG: Recent Labs  Lab 06/11/18 1207 06/11/18 1635 06/11/18 2323 06/12/18 0341 06/12/18 0738  GLUCAP 114* 105* 120* 116* 113*   Lipid Profile: No results for input(s): CHOL, HDL, LDLCALC, TRIG, CHOLHDL, LDLDIRECT in the last 72 hours. Thyroid Function Tests: No results for input(s): TSH, T4TOTAL, FREET4, T3FREE, THYROIDAB in the last 72 hours. Anemia Panel: No results for input(s): VITAMINB12, FOLATE, FERRITIN, TIBC, IRON, RETICCTPCT  in the last 72 hours. Urine analysis:    Component Value Date/Time   COLORURINE YELLOW 06/03/2018 1347   APPEARANCEUR CLEAR 06/03/2018 1347   APPEARANCEUR Cloudy (A) 03/04/2018 1123    LABSPEC 1.019 06/03/2018 1347   PHURINE 8.0 06/03/2018 1347   GLUCOSEU NEGATIVE 06/03/2018 1347   HGBUR NEGATIVE 06/03/2018 1347   BILIRUBINUR NEGATIVE 06/03/2018 1347   BILIRUBINUR Negative 03/04/2018 1123   KETONESUR NEGATIVE 06/03/2018 1347   PROTEINUR 30 (A) 06/03/2018 1347   NITRITE NEGATIVE 06/03/2018 1347   LEUKOCYTESUR NEGATIVE 06/03/2018 1347   Sepsis Labs: _0 (procalcitonin:4,lacticidven:4)  ) Recent Results (from the past 240 hour(s))  Culture, Urine     Status: None   Collection Time: 06/03/18  1:48 PM  Result Value Ref Range Status   Specimen Description   Final    URINE, CATHETERIZED Performed at [facility], [place] [address] [place], [place] [address]    Special Requests   Final    NONE Performed at [facility], [place] [address], [place], [place] [address]    Culture   Final    NO GROWTH Performed at [facility], [place] [address], [place], [place] [address]    Report Status 06/04/2018 FINAL  Final  Culture, blood (routine x 2)     Status: Abnormal   Collection Time: 06/03/18  6:15 PM  Result Value Ref Range Status   Specimen Description   Final    BLOOD BLOOD RIGHT HAND Performed at [facility] [address], [place], [place] [address]    Special Requests   Final    BOTTLES DRAWN AEROBIC AND ANAEROBIC Blood Culture adequate volume Performed at [place] [address], [place], [place] [address]    Culture  Setup Time   Final    ANAEROBIC BOTTLE ONLY GRAM POSITIVE COCCI CRITICAL RESULT CALLED TO, READ BACK BY AND VERIFIED WITH: J.MIZE,RN AT 0022 ON 06/05/18 BY G.[email or web address] Performed at [facility], [place] [address], [place], [place] [address]    Culture STAPHYLOCOCCUS EPIDERMIDIS (A)  Final   Report Status 06/06/2018 FINAL  Final   Organism ID, Bacteria STAPHYLOCOCCUS EPIDERMIDIS  Final      Susceptibility   Staphylococcus epidermidis - MIC*     CIPROFLOXACIN <=0.5 SENSITIVE Sensitive     ERYTHROMYCIN >=8 RESISTANT Resistant     GENTAMICIN <=0.5 SENSITIVE Sensitive     OXACILLIN >=4 RESISTANT Resistant     TETRACYCLINE >=16 RESISTANT Resistant     VANCOMYCIN 2 SENSITIVE Sensitive     TRIMETH/SULFA <=10 SENSITIVE Sensitive     CLINDAMYCIN <=0.25 SENSITIVE Sensitive     RIFAMPIN <=0.5 SENSITIVE Sensitive     Inducible Clindamycin NEGATIVE Sensitive     * STAPHYLOCOCCUS EPIDERMIDIS  Blood Culture ID Panel (Reflexed)     Status: Abnormal   Collection Time: 06/03/18  6:15 PM  Result Value Ref Range Status   Enterococcus species NOT DETECTED NOT DETECTED Final   Listeria monocytogenes NOT DETECTED NOT DETECTED Final   Staphylococcus species DETECTED (A) NOT DETECTED Final    Comment: Methicillin (oxacillin) resistant coagulase negative staphylococcus. Possible blood culture contaminant (unless isolated from more than one blood culture draw or clinical case suggests pathogenicity). No antibiotic treatment is indicated for blood  culture contaminants. CRITICAL RESULT CALLED TO, READ BACK BY AND VERIFIED WITH: J.MIZE,RN AT 0022 ON 06/05/18 BY G.MCADOO    Staphylococcus aureus (BCID) NOT DETECTED NOT DETECTED Final   Methicillin resistance DETECTED (A)  NOT DETECTED Final    Comment: CRITICAL RESULT CALLED TO, READ BACK BY AND VERIFIED WITH: J.MIZE,RN AT 0022 ON 06/05/18 BY G.MCADOO    Streptococcus species NOT DETECTED NOT DETECTED Final   Streptococcus agalactiae NOT DETECTED NOT DETECTED Final   Streptococcus pneumoniae NOT DETECTED NOT DETECTED Final   Streptococcus pyogenes NOT DETECTED NOT DETECTED Final   Acinetobacter baumannii NOT DETECTED NOT DETECTED Final   Enterobacteriaceae species NOT DETECTED NOT DETECTED Final   Enterobacter cloacae complex NOT DETECTED NOT DETECTED Final   Escherichia coli NOT DETECTED NOT DETECTED Final   Klebsiella oxytoca NOT DETECTED NOT DETECTED Final   Klebsiella pneumoniae NOT DETECTED NOT  DETECTED Final   Proteus species NOT DETECTED NOT DETECTED Final   Serratia marcescens NOT DETECTED NOT DETECTED Final   Haemophilus influenzae NOT DETECTED NOT DETECTED Final   Neisseria meningitidis NOT DETECTED NOT DETECTED Final   Pseudomonas aeruginosa NOT DETECTED NOT DETECTED Final   Candida albicans NOT DETECTED NOT DETECTED Final   Candida glabrata NOT DETECTED NOT DETECTED Final   Candida krusei NOT DETECTED NOT DETECTED Final   Candida parapsilosis NOT DETECTED NOT DETECTED Final   Candida tropicalis NOT DETECTED NOT DETECTED Final    Comment: Performed at [facility], [place] [address] [place], [place] [address]  Culture, blood (routine x 2)     Status: Abnormal   Collection Time: 06/03/18  6:30 PM  Result Value Ref Range Status   Specimen Description   Final    BLOOD BLOOD RIGHT WRIST Performed at [place] [address], [place], [place] [address]    Special Requests   Final    BOTTLES DRAWN AEROBIC ONLY Blood Culture adequate volume Performed at [place] [address], [place], [place] [address]    Culture  Setup Time   Final    AEROBIC BOTTLE ONLY GRAM POSITIVE COCCI CRITICAL VALUE NOTED.  VALUE IS CONSISTENT WITH PREVIOUSLY REPORTED AND CALLED VALUE.    Culture (A)  Final    STAPHYLOCOCCUS EPIDERMIDIS SUSCEPTIBILITIES PERFORMED ON PREVIOUS CULTURE WITHIN THE LAST 5 DAYS. Performed at [facility], [place] [address], [place], [place] [address]    Report Status 06/07/2018 FINAL  Final  Culture, blood (Routine X 2) w Reflex to ID Panel     Status: None   Collection Time: 06/06/18  1:43 PM  Result Value Ref Range Status   Specimen Description   Final    BLOOD RIGHT HAND Performed at [place] [address] [place], [place] [address]    Special Requests   Final    BOTTLES DRAWN AEROBIC ONLY Blood Culture results may not be optimal due to an inadequate volume of blood received in  culture bottles Performed at [place] [address], [place], [place] [address]    Culture   Final    NO GROWTH 5 DAYS Performed at [facility], [place] [address], [place], [place] [address]    Report Status 06/11/2018 FINAL  Final  Culture, blood (Routine X 2) w Reflex to ID Panel     Status: None   Collection Time: 06/06/18  1:48 PM  Result Value Ref Range Status   Specimen Description   Final    BLOOD A-LINE Performed at [facility], [place] [address], [place], [place] [address]    Special Requests   Final    BOTTLES DRAWN AEROBIC ONLY Blood Culture adequate volume  Performed at [facility], [place] [address] [place], [place] [address]    Culture   Final    NO GROWTH 5 DAYS Performed at [facility], [place] [address] [place], [place] [address]    Report Status 06/11/2018 FINAL  Final         Radiology Studies: Dg Chest Port 1 View  Result Date: 06/12/2018 CLINICAL DATA:  68 year old female COVID-3. Respiratory failure. EXAM: PORTABLE CHEST 1 VIEW COMPARISON:  06/09/2018 and earlier. FINDINGS: Portable AP semi upright view at 0412 hours. Stable endotracheal tube tip between the level the clavicles and carina. Enteric tube courses to the abdomen, tip not included. Stable left PICC or subclavian central line. Widespread Patchy and confluent bilateral pulmonary opacity with continued dense retrocardiac involvement. No pneumothorax. Suggestion of veiling opacity at both lung bases suspicious for pleural effusion. Ventilation has not significantly changed since 06/08/2018. Stable cardiomegaly and mediastinal contours. Paucity of bowel gas. IMPRESSION: 1. Stable lines and tubes. 2. Stable ventilation since 06/08/18 with bilateral widespread bilateral pulmonary opacity, dense lower lobe collapse or consolidation, and possible pleural effusions. Electronically Signed   By: Genevie Ann M.D.   On: 06/12/2018 07:07        Scheduled  Meds:  sodium chloride   Intravenous Once   alteplase  2 mg Intracatheter Once   atorvastatin  40 mg Per Tube q1800   chlorhexidine gluconate (MEDLINE KIT)  15 mL Mouth Rinse BID   Chlorhexidine Gluconate Cloth  6 each Topical Daily   clopidogrel  75 mg Per Tube Daily   docusate  50 mg Oral BID   enoxaparin (LOVENOX) injection  60 mg Subcutaneous Q24H   feeding supplement (PRO-STAT SUGAR FREE 64)  60 mL Per Tube TID WC   fentaNYL  1 patch Transdermal Q72H   free water  200 mL Per Tube Q6H   furosemide  40 mg Intravenous Daily   insulin aspart  0-15 Units Subcutaneous Q4H   mouth rinse  15 mL Mouth Rinse 10 times per day   metoprolol tartrate  12.5 mg Per Tube BID   pantoprazole sodium  40 mg Per Tube Daily   sennosides  5 mL Oral QHS   silver sulfADIAZINE  1 application Topical Daily   sodium chloride flush  10-40 mL Intracatheter Q12H   sodium chloride flush  3 mL Intravenous Q12H   venlafaxine  37.5 mg Per Tube QHS   venlafaxine  75 mg Per Tube q morning - 10a   Continuous Infusions:  sodium chloride Stopped (06/08/18 1743)   ampicillin-sulbactam (UNASYN) IV 3 g (06/12/18 0700)   feeding supplement (OSMOLITE 1.2 CAL) 40 mL/hr at 06/12/18 0600   norepinephrine (LEVOPHED) Adult infusion 2 mcg/min (06/12/18 0600)   vancomycin Stopped (06/11/18 2004)     LOS: 21 days   The patient is critically ill with multiple organ systems failure and requires high complexity decision making for assessment and support, frequent evaluation and titration of therapies, application of advanced monitoring technologies and extensive interpretation of multiple databases. Critical Care Time devoted to patient care services described in this note  Time spent: 40 minutes    Kathie Dike, MD [facility]  If 7PM-7AM, please contact night-coverage [email or web address]  06/12/2018, 10:44 AM

## 2018-06-12 NOTE — Progress Notes (Signed)
NAME:  Shannon Carroll, MRN:  [id number], DOB:  18-Jul-1950, LOS: 21 ADMISSION DATE:  06/04/2018, CONSULTATION DATE:  05/23/2018 REFERRING MD:  Glenna Durand, CHIEF COMPLAINT:  SVT hpoxemia, COVID 23 POS   Brief History   68 yr old female with PMHx DM-2, Htn, old CVA, HFpEF, COPD, admitted 4/3 with COVID pneumonia/ ARDS Course complicated by prolonged ventilation and episode of aspiration 4/20   Significant Hospital Events   4/3 + COVID -19  4/4 ICU admit intubated  4/12 weaning trial, failed, de-sat, SVT  4/15 episodes of SVT 4/20 aspiration episode  Consults:  PCCM   Procedures:  ETT- 4/4 >> PICC LUE 4/11 >>  Significant Diagnostic Tests:  4/18 CT head > stable R frontal infarct, NAICP  Micro Data:  Blood cx 06/17/2018- NGTD Novel Coronavirus Positive result called 4/4. Tracheal aspirate 4/7>>> nml flora  4/15 blood > staph epidermidis 2/4 bottles 4/15 urine > neg 4/18 blood > ng   Antimicrobials:  Vancomycin- 05/23/2018 Cefepime- 05/23/2018-4/6, 4/8>>  Hydroxychloroquine- upon transfer to ICU -COVID (to end 4/9) Axithromycin - upon transfer to ICU- COVID (to end 4/8)  4/18 vancomycin >>  4/20 unasyn >>  Interim history/subjective:  Defervesced Weaned for 45 minutes this a.m. on pressure support 15/5 Good urine output with Lasix Remains on low-dose Levophed  Objective   Blood pressure 94/69, pulse 94, temperature 98.6 F (37 C), resp. rate (!) 30, height 5\' 8"  (1.727 m), weight 134.1 kg, SpO2 96 %.    Vent Mode: PRVC FiO2 (%):  [35 %] 35 % Set Rate:  [30 bmp] 30 bmp Vt Set:  [440 mL] 440 mL PEEP:  [5 cmH20] 5 cmH20 Pressure Support:  [15 cmH20] 15 cmH20 Plateau Pressure:  [20 cmH20-24 cmH20] 24 cmH20   Intake/Output Summary (Last 24 hours) at 06/12/2018 0947 Last data filed at 06/12/2018 0600 Gross per 24 hour  Intake 1503.13 ml  Output 1665 ml  Net -161.87 ml   Filed Weights   06/10/18 0500 06/11/18 0500 06/12/18 0428  Weight: 130.7 kg 133.8 kg 134.1 kg    Examination:   General:  Chronically ill appearing obese woman , no acute distress Mild pallor, no icterus No JVD Decreased breath sounds bilateral, secretions minimal per RN S1-S2 regular, sinus on monitor Soft nontender abdomen, tube feeds running Awake and follows one-step commands 2+ edema  Chest x-ray from 4/24 personally reviewed which shows bilateral airspace disease and effusions    Resolved Hospital Problem list   Transaminits.  Septic shock  Assessment & Plan:   ARDS: resolving , repeat insult 4/20 due to aspiration  -Spontaneous breathing trials as tolerated              -Drop respiratory rate to 25              -Goal would be one-way extubation if/when  she meets criteria              -lasix 40 Iv daily for negative balance  Septic shock:  - Levophed at low doses for MAP goal of 65 mmHg               Sedation:  - Minimize sedation as able (precedex and PRN fentanyl)             -Continue fentanyl patch  COVID pneumonia:  -Was treated with hydroxychloroquine and azithromycin Aspiration pneumonia-empiric Unasyn x 5-7 days total  Staph epidermidis bacteremia-likely contaminant since repeat cultures negative, can dc vanc in my opinion  Goals  of care - No tracheostomy ,DNR per prior discussions, full medical care, husband and sons understand the concept of one-way extubation, will see if we make any progress with weaning over the weekend and plan for extubation early next week  Best practice:  Diet:tube feed Pain/Anxiety/Delirium protocol (if indicated): Continuous infusion VAP protocol (if indicated): NA DVT prophylaxis: SCD, Lovenox GI prophylaxis: Protonix Glucose control: sliding scale Mobility: bed bound Code Status: Partial code - NO CPR ,no reintubation/ trach Family Communication: husband updated 4/23 Disposition: ICU    The patient is critically ill with multiple organ systems failure and requires high complexity decision making for assessment  and support, frequent evaluation and titration of therapies, application of advanced monitoring technologies and extensive interpretation of multiple databases. Critical Care Time devoted to patient care services described in this note independent of APP/resident  time is 31 minutes.    Kara Mead MD. [facility] Flood. [facility] Pager [phone] If no response call [phone]   06/12/2018

## 2018-06-12 NOTE — Progress Notes (Signed)
Son Shannon Carroll with pt at appx 2040 this evening. Patients husband was able to join the conversation through telephone with family. Patient was alert at appeared to be interactive during the conversation with family. She smiled several times and moved her lips, appearing like she was trying to speak to them. Family was updated and expressed gratitude for the care provided.

## 2018-06-12 NOTE — Progress Notes (Signed)
Spoke with Shannon Carroll via face time. Granddaughter, and husband also on video chat. Update provided. appreiciation of care relayed.

## 2018-06-12 NOTE — Progress Notes (Signed)
Son Brynda Rim with pt at appx 2130 this evening. Patient was alert intermittently during the conversation, but appeared to try to interact. She smiled several times and moved her lips. Family was updated and expressed gratitude for the care provided.

## 2018-06-13 ENCOUNTER — Inpatient Hospital Stay (HOSPITAL_COMMUNITY): Payer: Medicare Other

## 2018-06-13 DIAGNOSIS — J690 Pneumonitis due to inhalation of food and vomit: Secondary | ICD-10-CM

## 2018-06-13 LAB — GLUCOSE, CAPILLARY
Glucose-Capillary: 121 mg/dL — ABNORMAL HIGH (ref 70–99)
Glucose-Capillary: 89 mg/dL (ref 70–99)
Glucose-Capillary: 83 mg/dL (ref 70–99)
Glucose-Capillary: 75 mg/dL (ref 70–99)
Glucose-Capillary: 120 mg/dL — ABNORMAL HIGH (ref 70–99)
Glucose-Capillary: 136 mg/dL — ABNORMAL HIGH (ref 70–99)

## 2018-06-13 LAB — MAGNESIUM: Magnesium: 2.2 mg/dL (ref 1.7–2.4)

## 2018-06-13 LAB — CBC
HCT: 29 % — ABNORMAL LOW (ref 36.0–46.0)
Hemoglobin: 8.2 g/dL — ABNORMAL LOW (ref 12.0–15.0)
MCH: 26.3 pg (ref 26.0–34.0)
MCHC: 28.3 g/dL — ABNORMAL LOW (ref 30.0–36.0)
MCV: 92.9 fL (ref 80.0–100.0)
Platelets: 252 10*3/uL (ref 150–400)
RBC: 3.12 MIL/uL — ABNORMAL LOW (ref 3.87–5.11)
RDW: 21.2 % — ABNORMAL HIGH (ref 11.5–15.5)
WBC: 7.6 10*3/uL (ref 4.0–10.5)
nRBC: 0 % (ref 0.0–0.2)

## 2018-06-13 LAB — BASIC METABOLIC PANEL
Anion gap: 7 (ref 5–15)
BUN: 21 mg/dL (ref 8–23)
CO2: 29 mmol/L (ref 22–32)
Calcium: 7.5 mg/dL — ABNORMAL LOW (ref 8.9–10.3)
Chloride: 106 mmol/L (ref 98–111)
Creatinine, Ser: 0.93 mg/dL (ref 0.44–1.00)
GFR calc Af Amer: >60 mL/min (ref >60)
GFR calc non Af Amer: >60 mL/min (ref >60)
Glucose, Bld: 126 mg/dL — ABNORMAL HIGH (ref 70–99)
Potassium: 3.5 mmol/L (ref 3.5–5.1)
Sodium: 142 mmol/L (ref 135–145)

## 2018-06-13 MED ORDER — POTASSIUM CHLORIDE 20 MEQ/15ML (10%) PO SOLN
40.0000 meq | Freq: Once | ORAL | Status: DC
  Filled 2018-06-13: qty 30

## 2018-06-13 NOTE — Progress Notes (Signed)
Pt OGT looked displaced. Tube feeds held. This RN tried to re-advance it and pt became severely panicked and unable to soothe with distraction and relaxation techniques. Gave pt PRN versed to aid in relaxation. On reassessment of of placement of OGT and ETT, ETT now measured at 70, previously 25. OGT measured at 37cm at corner of mouth. RT notified. Will place order for ABD XRAY to confirm placement of tubes.

## 2018-06-13 NOTE — Progress Notes (Signed)
Repositioned OGT per xray recommendations. When auscultating to confirm placement again, I was unable to instill air into tube. Two RNs attempted without success. We then attempted to unclog OGT without success. We made the decision to replace the OGT with a new one. New OGT measured at 38 cm at the corner of the mouth. Confirmed placement by auscultation. Pt currently agitated, will restart TF when pt becomes more cooperative and consolable.

## 2018-06-13 NOTE — TOC Progression Note (Addendum)
Transition of Care [facility]) - Progression Note    Patient Details  Name: Shannon Carroll MRN: [id number] Date of Birth: Apr 06, 1950  Transition of Care [facility]) CM/SW [place], RN Phone Number: 06/13/2018, 4:13 PM  Clinical Narrative:    Patient has successfully weaned today, per progress notes. CM team will continue to monitor patient for PT/OT recommendations and dispositional needs.   Expected Discharge Plan: Skilled Nursing Facility(From [place], prior to that from [place]) Barriers to Discharge: Continued Medical Work up(SNF will require 2 negative COVID tests)  Expected Discharge Plan and Services Expected Discharge Plan: Skilled Nursing Facility(From [place], prior to that from [place])       Living arrangements for the past 2 months: Skilled Nursing Facility(In [place] from 3/31 through this admit, prior to that she lived with her husband in an ALF)                                       Social Determinants of Health (SDOH) Interventions    Readmission Risk Interventions No flowsheet data found.

## 2018-06-13 NOTE — Progress Notes (Signed)
Spoke with Son andrew. Update regarding Shannon Carroll is better today. She has successfully weaned X 5hrs. She is alert and looking around with expressions on her face with positive tracking. Will look forward to good day

## 2018-06-13 NOTE — Progress Notes (Signed)
NAME:  Shannon Carroll, MRN:  [id number], DOB:  [birth year], LOS: 79 ADMISSION DATE:  05/27/2018, CONSULTATION DATE:  05/23/2018 REFERRING MD:  Glenna Durand, CHIEF COMPLAINT:  Dyspnea   Brief History   68 y/o female with mild dementia, HTN admitted with COVID pneumonia and ARDS.     Past Medical History  CVA HFpEF COPD Mild dementia per family  [place] [facility] Events   4/3 + COVID -19  4/4 ICU admit intubated  4/12 weaning trial, failed, de-sat, SVT  4/15 episodes of SVT 4/20 aspiration episode  Consults:  PCCM  Procedures:  ETT- 4/4 >> PICC LUE 4/11 >>  Significant Diagnostic Tests:    Micro Data:  Blood cx 06/18/2018- NGTD Novel Coronavirus Positive result called 4/4. Tracheal aspirate 4/7>>> nml flora  4/15 blood > staph epidermidis 2/4 bottles 4/15 urine > neg 4/18 blood > ng  Antimicrobials:  Vancomycin- 05/23/2018 Cefepime- 05/23/2018-4/6, 4/8>>  Hydroxychloroquine- upon transfer to ICU -COVID (to end 4/9) Axithromycin - upon transfer to ICU- COVID (to end 4/8)  4/18 vancomycin >>  4/20 unasyn >> 4/25  Interim history/subjective:  Remains mechanically ventilated Weaning some diuresing  Objective   Blood pressure 115/89, pulse 93, temperature 97.6 F (36.4 C), temperature source Axillary, resp. rate (!) 22, height 5\' 8"  (1.727 m), weight 131.5 kg, SpO2 97 %.    Vent Mode: PSV;CPAP FiO2 (%):  [35 %] 35 % Set Rate:  [25 bmp] 25 bmp Vt Set:  [440 mL] 440 mL PEEP:  [5 cmH20] 5 cmH20 Pressure Support:  [15 cmH20] 15 cmH20 Plateau Pressure:  [21 cmH20-24 cmH20] 23 cmH20   Intake/Output Summary (Last 24 hours) at 06/13/2018 0831 Last data filed at 06/13/2018 0700 Gross per 24 hour  Intake 3504.55 ml  Output 5295 ml  Net -1790.45 ml   Filed Weights   06/11/18 0500 06/12/18 0428 06/13/18 0500  Weight: 133.8 kg 134.1 kg 131.5 kg    Examination:  General:  In bed on vent HENT: NCAT ETT in place PULM: CTA B, vent supported breathing CV: RRR, no mgr  GI: BS+, soft, nontender MSK: normal bulk and tone Neuro: awake on vent, doesn't follow commands but eyes are open    CXR personally reviewed (lung windows from abdominal film): persistent bilateral airspace disease; reviewing old CXR's shows significant worsening after 4/18 film  Resolved Hospital Problem list    Assessment & Plan:  ARDS from COVID-19, oxygenation improving Aspiration pneumonia worsened oxygenation transiently this week but now making improvements again, continued respiratory muscle weakness but able to tolerate some spontaneous breathing trial this morning with moderate support Continue Lasix Continue pressure support ventilation as tolerated during the daytime Ventilator associated pneumonia prevention protocol Minimize sedation  Shock: Minimal, due to diuretics Continue Lasix for now Continue Levophed to maintain mean arterial pressure greater than 65  Aspiration pneumonia okay to stop Unasyn at this point  Staph epi bacteremia: I favor that this was a contaminant, current plan is to treat with vancomycin for 14 days  Goals of care will discuss with family, plan for one-way extubation early next week  Best practice:  Per TRH   Labs   CBC: Recent Labs  Lab 06/09/18 0255 06/09/18 1820 06/10/18 0500 06/11/18 0500 06/12/18 0135 06/13/18 0320  WBC 11.0*  --  12.1* 10.7* 6.9 7.6  HGB 6.7* 8.0* 8.0* 8.1* 7.7* 8.2*  HCT 24.0* 27.5* 28.7* 28.7* 27.6* 29.0*  MCV 93.0  --  92.9 92.9 92.0 92.9  PLT 216  --  265  251 229 340    Basic Metabolic Panel: Recent Labs  Lab 06/09/18 0255 06/10/18 0500 06/11/18 0500 06/12/18 0135 06/13/18 0320  NA 144 144 142 142 142  K 3.7 3.3* 3.8 3.5 3.5  CL 114* 110 109 107 106  CO2 22 26 27 27 29   GLUCOSE 136* 113* 115* 136* 126*  BUN 27* 33* 31* 28* 21  CREATININE 0.86 0.93 0.90 0.92 0.93  CALCIUM 6.6* 7.4* 7.4* 7.2* 7.5*  MG 1.9 2.5* 2.4 2.2 2.2  PHOS  --   --   --  3.5  --    GFR: Estimated Creatinine  Clearance: 84.2 mL/min (by C-G formula based on SCr of 0.93 mg/dL). Recent Labs  Lab 06/10/18 0500 06/11/18 0500 06/12/18 0135 06/13/18 0320  WBC 12.1* 10.7* 6.9 7.6    Liver Function Tests: No results for input(s): AST, ALT, ALKPHOS, BILITOT, PROT, ALBUMIN in the last 168 hours. No results for input(s): LIPASE, AMYLASE in the last 168 hours. No results for input(s): AMMONIA in the last 168 hours.  ABG    Component Value Date/Time   PHART 7.392 06/08/2018 1629   PCO2ART 44.2 06/08/2018 1629   PO2ART 71.0 (L) 06/08/2018 1629   HCO3 26.4 06/08/2018 1629   TCO2 28 06/08/2018 1629   O2SAT 92.0 06/08/2018 1629     Coagulation Profile: No results for input(s): INR, PROTIME in the last 168 hours.  Cardiac Enzymes: No results for input(s): CKTOTAL, CKMB, CKMBINDEX, TROPONINI in the last 168 hours.  HbA1C: Hgb A1c MFr Bld  Date/Time Value Ref Range Status  03/02/2018 04:43 PM 5.2 4.8 - 5.6 % Final    Comment:             Prediabetes: 5.7 - 6.4          Diabetes: >6.4          Glycemic control for adults with diabetes: <7.0     CBG: Recent Labs  Lab 06/12/18 1230 06/12/18 1543 06/12/18 1955 06/12/18 2305 06/13/18 0334  GLUCAP 119* 130* 130* 143* 121*     Critical care time: 31 minutes    Roselie Awkward, MD [facility] Pager: [phone] Cell: [phone] If no response, call [phone]

## 2018-06-13 NOTE — Progress Notes (Signed)
PROGRESS NOTE    Shannon Carroll  YKD:[id number] DOB: Jun 22, 1950 DOA: 05/31/2018 PCP: Harlan Stains, MD   Brief Narrative:  68 yr WF PMHx Chronic Diastolic (EF 39%), COPD, CVA, SVT, essential hypertension, obstructive sleep apnea, morbid obesity,   Presented to the hospital with acute shortness of breath was diagnosed with COVID-19 infection, went into acute hypoxic respiratory failure intubated and kept in ICU by pulmonary critical care. Transferred to [facility] ICU unit on 06/01/2018.     Subjective: Remains intubated. Awake, but does not follow commands    Assessment & Plan:   Principal Problem:   Multifocal pneumonia Active Problems:   Sleep apnea   Shortness of breath   Acute on chronic diastolic CHF (congestive heart failure) (HCC)   Hypertension   Stroke (HCC)   CKD (chronic kidney disease), stage III (HCC)   Hypokalemia   Abnormal LFTs   Acute respiratory failure (HCC)   Elevated troponin   SVT (supraventricular tachycardia) (HCC)   Acute on chronic respiratory failure (HCC)   Dementia, vascular (HCC)   ARDS (adult respiratory distress syndrome) ([place])   COVID-19 virus infection   Aspiration pneumonia of both lower lobes due to gastric secretions (HCC)   Acute respiratory failure with hypoxia/COVID-19 pneumonia -PCCM managing, weaning trials per critical care - Completed treatment with hydroxychloroquine and azithromycin  Aspiration pneumonia - 4/20 patient had witnessed aspiration - 4/21 PCXR consistent with aspiration pneumonia. - she is now afebrile - Complete 5-day course of antibiotics, currently on unasyn  Septic shock - Patient back on Levophed to maintain adequate perfusion pressure. - Continues on low dose Levophed to maintain MAP>65   Staph Epidermidis Bacteremia  -Secondary to contaminated PICC line?  Case discussed by Dr. Sherral Hammers with Dr. Prince Rome from ID who recommended treating with vancomycin through PICC line and repeating  blood cultures.  If repeat blood cultures negative then could leave PICC line in. If positive, PICC line will need to be removed -Initial Blood cultures positive for staph epidermidis 2/2 -Repeat cultures have shown no growth -plan is to treat with a total of 14 days with vancomycin  Acute renal failure -Resolved - Monitor creatinine level Recent Labs  Lab 06/09/18 0255 06/10/18 0500 06/11/18 0500 06/12/18 0135 06/13/18 0320  CREATININE 0.86 0.93 0.90 0.92 0.93    Iron deficiency anemia, acute on chronic - Likely related to critical illness - Fecal occult pending Recent Labs  Lab 06/09/18 1820 06/10/18 0500 06/11/18 0500 06/12/18 0135 06/13/18 0320  HGB 8.0* 8.0* 8.1* 7.7* 8.2*  - 4/21 transfused 1 unit PRBC  Chronic diastolic CHF -Strict in and out +14.3 L -Daily weight.   Filed Weights   06/11/18 0500 06/12/18 0428 06/13/18 0500  Weight: 133.8 kg 134.1 kg 131.5 kg  -started on intravenous lasix with good urine output -weight is trending down  SVT -Short burst SVT during CPAP on 4/14 resolved with beta-blockers continue as needed -Recent TTE shows EF of 65% with no WMA -she is continued on low dose metoprolol   Morbid obesity   OSA -Currently vented  Diabetes type 2 controlled with complication - 7/68 hemoglobin A1c= 5.2  HLD - LDL not within AHA/ADA guidelines - 4/20 increase Lipitor to 40 mg daily  Nutrition -Continue tube feeds   Hx CVA - Plavix - ASA 81 mg    Acute metabolic encephalopathy -Underlying dementia - Recent CVA x2  -Encephalopathy appears to be resolving.      DVT prophylaxis: Lovenox Code Status: DNR Family  Communication: updated husband over the phone 4/25 Disposition Plan: TBD   Consultants:  PCCM    Procedures/Significant Events:  4/21 PCXR-tube and catheter positions as described without pneumothorax. -Widespread airspace opacity bilaterally, similar to 1 day prior. Suspect combination of alveolar edema and  pneumonia.  -Small pleural effusions noted. Pulmonary vascular congestion present.    I have personally reviewed and interpreted all radiology studies and my findings are as above.  VENTILATOR SETTINGS: Mode: PRVC Vt set 440 set rate: 30 FiO2 35% PEEP: 5    Cultures 4/3 blood RIGHT hand negative x2 4/5 MRSA by PCR negative 4/7 tracheal aspirate normal flora 4/15 urine negative 4/15 blood positive STAPHYLOCOCCUS EPIDERMIDIS 2/2 4/18 blood RIGHT hand NGTD 4/18 blood A-line NGTD    Antimicrobials: Anti-infectives (From admission, onward)   Start     Stop   06/07/18 1500  vancomycin (VANCOCIN) 1,500 mg in sodium chloride 0.9 % 500 mL IVPB         06/06/18 1500  vancomycin (VANCOCIN) 2,000 mg in sodium chloride 0.9 % 500 mL IVPB     06/06/18 1700   05/27/18 2200  ceFEPIme (MAXIPIME) 2 g in sodium chloride 0.9 % 100 mL IVPB  Status:  Discontinued     05/27/18 1400   05/27/18 1200  ceFEPIme (MAXIPIME) 2 g in sodium chloride 0.9 % 100 mL IVPB     05/27/18 1330   05/26/18 2200  azithromycin (ZITHROMAX) tablet 500 mg  Status:  Discontinued     05/26/18 1502   05/26/18 2200  azithromycin (ZITHROMAX) tablet 500 mg     05/27/18 2141   05/24/18 2200  hydroxychloroquine (PLAQUENIL) tablet 200 mg     05/28/18 1025   05/23/18 2300  azithromycin (ZITHROMAX) 500 mg in sodium chloride 0.9 % 250 mL IVPB  Status:  Discontinued     05/26/18 0715   05/23/18 2200  hydroxychloroquine (PLAQUENIL) tablet 400 mg     05/24/18 1114   05/23/18 1500  vancomycin (VANCOCIN) 1,250 mg in sodium chloride 0.9 % 250 mL IVPB  Status:  Discontinued     05/25/18 1141   05/23/18 0230  ceFEPIme (MAXIPIME) 2 g in sodium chloride 0.9 % 100 mL IVPB  Status:  Discontinued     05/25/18 1334   06/04/2018 1545  metroNIDAZOLE (FLAGYL) IVPB 500 mg     06/16/2018 1731   06/04/2018 1530  vancomycin (VANCOCIN) IVPB 1000 mg/200 mL premix     05/28/2018 1731   05/27/2018 1415  vancomycin (VANCOCIN) IVPB 1000 mg/200 mL premix      06/17/2018 1538   05/27/2018 1415  ceFEPIme (MAXIPIME) 2 g in sodium chloride 0.9 % 100 mL IVPB     06/18/2018 1509       Devices #7.5 ETT 4/4>>>   LINES / TUBES:      Continuous Infusions:  sodium chloride 10 mL/hr at 06/13/18 0400   ampicillin-sulbactam (UNASYN) IV 3 g (06/13/18 0800)   feeding supplement (OSMOLITE 1.2 CAL) Stopped (06/13/18 0600)   norepinephrine (LEVOPHED) Adult infusion 4 mcg/min (06/13/18 0700)   vancomycin Stopped (06/12/18 2041)     Objective: Vitals:   06/13/18 0700 06/13/18 0737 06/13/18 0746 06/13/18 1132  BP: 115/85 115/85 115/89 99/77  Pulse: 82 94 93 73  Resp: (!) 25 19 (!) 22 16  Temp:      TempSrc:      SpO2: 99% 98% 97% 98%  Weight:      Height:        Intake/Output Summary (  Last 24 hours) at 06/13/2018 1200 Last data filed at 06/13/2018 0700 Gross per 24 hour  Intake 3068.38 ml  Output 2495 ml  Net 573.38 ml   Filed Weights   06/11/18 0500 06/12/18 0428 06/13/18 0500  Weight: 133.8 kg 134.1 kg 131.5 kg   General exam: awake, intubated Respiratory system: bilateral rhonchi. Respiratory effort normal. Cardiovascular system:RRR. No murmurs, rubs, gallops. Gastrointestinal system: Abdomen is nondistended, soft and nontender. No organomegaly or masses felt. Normal bowel sounds heard. Central nervous system: she does not participate in exam Extremities: 1+ edema bilaterally Skin: No rashes, lesions or ulcers Psychiatry: unable to assess    Data Reviewed: Care during the described time interval was provided by me .  I have reviewed this patient's available data, including medical history, events of note, physical examination, and all test results as part of my evaluation.   CBC: Recent Labs  Lab 06/09/18 0255 06/09/18 1820 06/10/18 0500 06/11/18 0500 06/12/18 0135 06/13/18 0320  WBC 11.0*  --  12.1* 10.7* 6.9 7.6  HGB 6.7* 8.0* 8.0* 8.1* 7.7* 8.2*  HCT 24.0* 27.5* 28.7* 28.7* 27.6* 29.0*  MCV 93.0  --  92.9 92.9 92.0  92.9  PLT 216  --  265 251 229 004   Basic Metabolic Panel: Recent Labs  Lab 06/09/18 0255 06/10/18 0500 06/11/18 0500 06/12/18 0135 06/13/18 0320  NA 144 144 142 142 142  K 3.7 3.3* 3.8 3.5 3.5  CL 114* 110 109 107 106  CO2 '22 26 27 27 29  '$ GLUCOSE 136* 113* 115* 136* 126*  BUN 27* 33* 31* 28* 21  CREATININE 0.86 0.93 0.90 0.92 0.93  CALCIUM 6.6* 7.4* 7.4* 7.2* 7.5*  MG 1.9 2.5* 2.4 2.2 2.2  PHOS  --   --   --  3.5  --    GFR: Estimated Creatinine Clearance: 84.2 mL/min (by C-G formula based on SCr of 0.93 mg/dL). Liver Function Tests: No results for input(s): AST, ALT, ALKPHOS, BILITOT, PROT, ALBUMIN in the last 168 hours. No results for input(s): LIPASE, AMYLASE in the last 168 hours. No results for input(s): AMMONIA in the last 168 hours. Coagulation Profile: No results for input(s): INR, PROTIME in the last 168 hours. Cardiac Enzymes: No results for input(s): CKTOTAL, CKMB, CKMBINDEX, TROPONINI in the last 168 hours. BNP (last 3 results) No results for input(s): PROBNP in the last 8760 hours. HbA1C: No results for input(s): HGBA1C in the last 72 hours. CBG: Recent Labs  Lab 06/12/18 1543 06/12/18 1955 06/12/18 2305 06/13/18 0334 06/13/18 0831  GLUCAP 130* 130* 143* 121* 89   Lipid Profile: No results for input(s): CHOL, HDL, LDLCALC, TRIG, CHOLHDL, LDLDIRECT in the last 72 hours. Thyroid Function Tests: No results for input(s): TSH, T4TOTAL, FREET4, T3FREE, THYROIDAB in the last 72 hours. Anemia Panel: No results for input(s): VITAMINB12, FOLATE, FERRITIN, TIBC, IRON, RETICCTPCT in the last 72 hours. Urine analysis:    Component Value Date/Time   COLORURINE YELLOW 06/03/2018 1347   APPEARANCEUR CLEAR 06/03/2018 1347   APPEARANCEUR Cloudy (A) 03/04/2018 1123   LABSPEC 1.019 06/03/2018 1347   PHURINE 8.0 06/03/2018 1347   GLUCOSEU NEGATIVE 06/03/2018 1347   HGBUR NEGATIVE 06/03/2018 1347   BILIRUBINUR NEGATIVE 06/03/2018 1347   BILIRUBINUR Negative  03/04/2018 [place] 06/03/2018 1347   PROTEINUR 30 (A) 06/03/2018 1347   NITRITE NEGATIVE 06/03/2018 1347   LEUKOCYTESUR NEGATIVE 06/03/2018 1347   Sepsis Labs: '@LABRCNTIP'$ (procalcitonin:4,lacticidven:4)  ) Recent Results (from the past 240 hour(s))  Culture,  Urine     Status: None   Collection Time: 06/03/18  1:48 PM  Result Value Ref Range Status   Specimen Description   Final    URINE, CATHETERIZED Performed at [facility] [address], [place], [place] [address]    Special Requests   Final    NONE Performed at [facility], [place] [address], [place], [place] [address]    Culture   Final    NO GROWTH Performed at [facility], [place] [address] [place], [place] [address]    Report Status 06/04/2018 FINAL  Final  Culture, blood (routine x 2)     Status: Abnormal   Collection Time: 06/03/18  6:15 PM  Result Value Ref Range Status   Specimen Description   Final    BLOOD BLOOD RIGHT HAND Performed at [facility] [address] [place], [place] [address]    Special Requests   Final    BOTTLES DRAWN AEROBIC AND ANAEROBIC Blood Culture adequate volume Performed at [facility] [address], [place], [place] [address]    Culture  Setup Time   Final    ANAEROBIC BOTTLE ONLY GRAM POSITIVE COCCI CRITICAL RESULT CALLED TO, READ BACK BY AND VERIFIED WITH: J.MIZE,RN AT 0022 ON 06/05/18 BY G.MCADOO Performed at [facility], [place] [address], [place], [place] [address]    Culture STAPHYLOCOCCUS EPIDERMIDIS (A)  Final   Report Status 06/06/2018 FINAL  Final   Organism ID, Bacteria STAPHYLOCOCCUS EPIDERMIDIS  Final      Susceptibility   Staphylococcus epidermidis - MIC*    CIPROFLOXACIN <=0.5 SENSITIVE Sensitive     ERYTHROMYCIN >=8 RESISTANT Resistant     GENTAMICIN <=0.5 SENSITIVE Sensitive     OXACILLIN >=4 RESISTANT Resistant     TETRACYCLINE >=16 RESISTANT Resistant       VANCOMYCIN 2 SENSITIVE Sensitive     TRIMETH/SULFA <=10 SENSITIVE Sensitive     CLINDAMYCIN <=0.25 SENSITIVE Sensitive     RIFAMPIN <=0.5 SENSITIVE Sensitive     Inducible Clindamycin NEGATIVE Sensitive     * STAPHYLOCOCCUS EPIDERMIDIS  Blood Culture ID Panel (Reflexed)     Status: Abnormal   Collection Time: 06/03/18  6:15 PM  Result Value Ref Range Status   Enterococcus species NOT DETECTED NOT DETECTED Final   Listeria monocytogenes NOT DETECTED NOT DETECTED Final   Staphylococcus species DETECTED (A) NOT DETECTED Final    Comment: Methicillin (oxacillin) resistant coagulase negative staphylococcus. Possible blood culture contaminant (unless isolated from more than one blood culture draw or clinical case suggests pathogenicity). No antibiotic treatment is indicated for blood  culture contaminants. CRITICAL RESULT CALLED TO, READ BACK BY AND VERIFIED WITH: J.MIZE,RN AT 0022 ON 06/05/18 BY G.MCADOO    Staphylococcus aureus (BCID) NOT DETECTED NOT DETECTED Final   Methicillin resistance DETECTED (A) NOT DETECTED Final    Comment: CRITICAL RESULT CALLED TO, READ BACK BY AND VERIFIED WITH: J.MIZE,RN AT 0022 ON 06/05/18 BY G.MCADOO    Streptococcus species NOT DETECTED NOT DETECTED Final   Streptococcus agalactiae NOT DETECTED NOT DETECTED Final   Streptococcus pneumoniae NOT DETECTED NOT DETECTED Final   Streptococcus pyogenes NOT DETECTED NOT DETECTED Final   Acinetobacter baumannii NOT DETECTED NOT DETECTED Final   Enterobacteriaceae species NOT DETECTED NOT DETECTED Final   Enterobacter cloacae complex NOT DETECTED NOT DETECTED Final   Escherichia coli NOT DETECTED NOT DETECTED Final   Klebsiella oxytoca NOT DETECTED NOT DETECTED Final  Klebsiella pneumoniae NOT DETECTED NOT DETECTED Final   Proteus species NOT DETECTED NOT DETECTED Final   Serratia marcescens NOT DETECTED NOT DETECTED Final   Haemophilus influenzae NOT DETECTED NOT DETECTED Final   Neisseria meningitidis NOT  DETECTED NOT DETECTED Final   Pseudomonas aeruginosa NOT DETECTED NOT DETECTED Final   Candida albicans NOT DETECTED NOT DETECTED Final   Candida glabrata NOT DETECTED NOT DETECTED Final   Candida krusei NOT DETECTED NOT DETECTED Final   Candida parapsilosis NOT DETECTED NOT DETECTED Final   Candida tropicalis NOT DETECTED NOT DETECTED Final    Comment: Performed at [facility], [place] [address] [place], [place] [address]  Culture, blood (routine x 2)     Status: Abnormal   Collection Time: 06/03/18  6:30 PM  Result Value Ref Range Status   Specimen Description   Final    BLOOD BLOOD RIGHT WRIST Performed at [facility] [address], [place], [place] [address]    Special Requests   Final    BOTTLES DRAWN AEROBIC ONLY Blood Culture adequate volume Performed at [facility] [address], [place], [place] [address]    Culture  Setup Time   Final    AEROBIC BOTTLE ONLY GRAM POSITIVE COCCI CRITICAL VALUE NOTED.  VALUE IS CONSISTENT WITH PREVIOUSLY REPORTED AND CALLED VALUE.    Culture (A)  Final    STAPHYLOCOCCUS EPIDERMIDIS SUSCEPTIBILITIES PERFORMED ON PREVIOUS CULTURE WITHIN THE LAST 5 DAYS. Performed at [facility], [place] [address] [place], [place] [address]    Report Status 06/07/2018 FINAL  Final  Culture, blood (Routine X 2) w Reflex to ID Panel     Status: None   Collection Time: 06/06/18  1:43 PM  Result Value Ref Range Status   Specimen Description   Final    BLOOD RIGHT HAND Performed at [facility] [address], [place], [place] [address]    Special Requests   Final    BOTTLES DRAWN AEROBIC ONLY Blood Culture results may not be optimal due to an inadequate volume of blood received in culture bottles Performed at [facility] [address] [place], [place] [address]    Culture   Final    NO GROWTH 5 DAYS Performed at [facility], [place] [address],  [place], [place] [address]    Report Status 06/11/2018 FINAL  Final  Culture, blood (Routine X 2) w Reflex to ID Panel     Status: None   Collection Time: 06/06/18  1:48 PM  Result Value Ref Range Status   Specimen Description   Final    BLOOD A-LINE Performed at [facility], [place] [address], [place], [place] [address]    Special Requests   Final    BOTTLES DRAWN AEROBIC ONLY Blood Culture adequate volume Performed at [facility] [address], [place], [place] [address]    Culture   Final    NO GROWTH 5 DAYS Performed at [facility], [place] [address] [place], [place] [address]    Report Status 06/11/2018 FINAL  Final         Radiology Studies: Dg Chest Port 1 View  Result Date: 06/12/2018 CLINICAL DATA:  68 year old female COVID-34. Respiratory failure. EXAM: PORTABLE CHEST 1 VIEW COMPARISON:  06/09/2018 and earlier. FINDINGS: Portable AP semi upright view at 0412 hours. Stable endotracheal tube tip between the level the clavicles and carina. Enteric tube courses  to the abdomen, tip not included. Stable left PICC or subclavian central line. Widespread Patchy and confluent bilateral pulmonary opacity with continued dense retrocardiac involvement. No pneumothorax. Suggestion of veiling opacity at both lung bases suspicious for pleural effusion. Ventilation has not significantly changed since 06/08/2018. Stable cardiomegaly and mediastinal contours. Paucity of bowel gas. IMPRESSION: 1. Stable lines and tubes. 2. Stable ventilation since 06/08/18 with bilateral widespread bilateral pulmonary opacity, dense lower lobe collapse or consolidation, and possible pleural effusions. Electronically Signed   By: Genevie Ann M.D.   On: 06/12/2018 07:07   Dg Abd Portable 1v  Result Date: 06/13/2018 CLINICAL DATA:  Evaluate OG tube EXAM: PORTABLE ABDOMEN - 1 VIEW COMPARISON:  June 13, 2018 FINDINGS: The OG tube still terminates below today's film in the right lower abdomen. I  suspect the tube is within the duodenum. A left-sided pleural effusion with underlying opacity remains. No other acute abnormalities. IMPRESSION: 1. The OG tube terminates below today's film, in the lower right abdomen. The tube is thought to terminate within the duodenum. Recommend further withdrawal of 8 cm. 2. Effusion and underlying opacity in left base. Electronically Signed   By: Dorise Bullion III M.D   On: 06/13/2018 07:59   Dg Abd Portable 1v  Result Date: 06/13/2018 CLINICAL DATA:  Evaluate OG tube placement EXAM: PORTABLE ABDOMEN - 1 VIEW COMPARISON:  None. FINDINGS: The OG tube terminates below today's film, likely in the duodenum. IMPRESSION: The OG tube terminates below today's film and appears to exit the stomach into the duodenum. Recommend repositioning with withdrawal of at least 8 cm. Electronically Signed   By: Dorise Bullion III M.D   On: 06/13/2018 05:20        Scheduled Meds:  atorvastatin  40 mg Per Tube q1800   chlorhexidine gluconate (MEDLINE KIT)  15 mL Mouth Rinse BID   Chlorhexidine Gluconate Cloth  6 each Topical Daily   clopidogrel  75 mg Per Tube Daily   docusate  50 mg Oral BID   enoxaparin (LOVENOX) injection  60 mg Subcutaneous Q24H   feeding supplement (PRO-STAT SUGAR FREE 64)  60 mL Per Tube TID WC   fentaNYL  1 patch Transdermal Q72H   free water  200 mL Per Tube Q6H   furosemide  40 mg Intravenous Daily   insulin aspart  0-15 Units Subcutaneous Q4H   mouth rinse  15 mL Mouth Rinse 10 times per day   metoprolol tartrate  12.5 mg Per Tube BID   pantoprazole sodium  40 mg Per Tube Daily   potassium chloride  40 mEq Per Tube Once   sennosides  5 mL Oral QHS   silver sulfADIAZINE  1 application Topical Daily   sodium chloride flush  10-40 mL Intracatheter Q12H   sodium chloride flush  3 mL Intravenous Q12H   venlafaxine  37.5 mg Per Tube QHS   venlafaxine  75 mg Per Tube q morning - 10a   Continuous Infusions:  sodium  chloride 10 mL/hr at 06/13/18 0400   ampicillin-sulbactam (UNASYN) IV 3 g (06/13/18 0800)   feeding supplement (OSMOLITE 1.2 CAL) Stopped (06/13/18 0600)   norepinephrine (LEVOPHED) Adult infusion 4 mcg/min (06/13/18 0700)   vancomycin Stopped (06/12/18 2041)     LOS: 22 days   The patient is critically ill with multiple organ systems failure and requires high complexity decision making for assessment and support, frequent evaluation and titration of therapies, application of advanced monitoring technologies and extensive interpretation of multiple databases.  Critical Care Time devoted to patient care services described in this note  Time spent: 40 minutes    Kathie Dike, MD [facility]  If 7PM-7AM, please contact night-coverage [email or web address]  06/13/2018, 12:00 PM

## 2018-06-14 LAB — GLUCOSE, CAPILLARY
Glucose-Capillary: 134 mg/dL — ABNORMAL HIGH (ref 70–99)
Glucose-Capillary: 114 mg/dL — ABNORMAL HIGH (ref 70–99)
Glucose-Capillary: 129 mg/dL — ABNORMAL HIGH (ref 70–99)
Glucose-Capillary: 122 mg/dL — ABNORMAL HIGH (ref 70–99)
Glucose-Capillary: 131 mg/dL — ABNORMAL HIGH (ref 70–99)

## 2018-06-14 LAB — CBC
HCT: 26.1 % — ABNORMAL LOW (ref 36.0–46.0)
Hemoglobin: 7.4 g/dL — ABNORMAL LOW (ref 12.0–15.0)
MCH: 26.1 pg (ref 26.0–34.0)
MCHC: 28.4 g/dL — ABNORMAL LOW (ref 30.0–36.0)
MCV: 92.2 fL (ref 80.0–100.0)
Platelets: 236 10*3/uL (ref 150–400)
RBC: 2.83 MIL/uL — ABNORMAL LOW (ref 3.87–5.11)
RDW: 21.2 % — ABNORMAL HIGH (ref 11.5–15.5)
WBC: 7 10*3/uL (ref 4.0–10.5)
nRBC: 0 % (ref 0.0–0.2)

## 2018-06-14 LAB — BASIC METABOLIC PANEL
Anion gap: 6 (ref 5–15)
BUN: 23 mg/dL (ref 8–23)
CO2: 29 mmol/L (ref 22–32)
Calcium: 7.3 mg/dL — ABNORMAL LOW (ref 8.9–10.3)
Chloride: 109 mmol/L (ref 98–111)
Creatinine, Ser: 0.88 mg/dL (ref 0.44–1.00)
GFR calc Af Amer: >60 mL/min (ref >60)
GFR calc non Af Amer: >60 mL/min (ref >60)
Glucose, Bld: 132 mg/dL — ABNORMAL HIGH (ref 70–99)
Potassium: 3 mmol/L — ABNORMAL LOW (ref 3.5–5.1)
Sodium: 144 mmol/L (ref 135–145)

## 2018-06-14 MED ORDER — POTASSIUM CHLORIDE 20 MEQ/15ML (10%) PO SOLN
40.0000 meq | ORAL | Status: AC
  Administered 2018-06-14 (×2): 40 meq
  Filled 2018-06-14 (×2): qty 30

## 2018-06-14 NOTE — Progress Notes (Signed)
Pharmacy Antibiotic Note  Shannon Carroll is a 68 y.o. female admitted on 06/16/2018 with COVID 19 infection.  Blood cultures are positive for MRSE; ID recommends treatment. Pharmacy has been consulted for vancomycin dosing.  Pharmacy was also consulted to add Unasyn for aspiration pneumonia coverage.  D 9 Vancomycin D 5 Unasyn - completed on 4/25  Plan:  Continue Vancomycin 1g IV q24h.  Goal AUC = 400 - 500.  Expected AUC 524  Follow up renal function, culture results, and clinical course.  Vancomycin levels ordered for 4/26-4/27   Height: 5\' 8"  (172.7 cm) Weight: 289 lb 3.9 oz (131.2 kg) IBW/kg (Calculated) : 63.9  Temp (24hrs), Avg:99 F (37.2 C), Min:98.3 F (36.8 C), Max:99.5 F (37.5 C)  Recent Labs  Lab 06/10/18 0500 06/10/18 1848 06/11/18 0500 06/11/18 1530 06/12/18 0135 06/13/18 0320 06/14/18 0157  WBC 12.1*  --  10.7*  --  6.9 7.6 7.0  CREATININE 0.93  --  0.90  --  0.92 0.93 0.88  VANCOTROUGH  --   --   --  16  --   --   --   VANCOPEAK  --  53*  --   --   --   --   --     Estimated Creatinine Clearance: 88.9 mL/min (by C-G formula based on SCr of 0.88 mg/dL).    Allergies  Allergen Reactions  . Lisinopril Cough   Antimicrobials this admission:  4/4 Hydroxy>> (4/9)  4/4 Azithro >> (4/8) 4/3 Vanc >>4/6, 4/18 >> 4/3 Cefepime >>4/6, restarted 4/8>>4/9 4/3 Flagyl x 1  4/20 Unasyn >> 4/26  Dose adjustments this admission:  4/22 VP = 53, 4/23 VT = 16, calculated AUC 786.  Vancomycin dose decreased.  Microbiology results:  4/5 mrsa pcr: neg 4/3 blood cx: neg 4/3 Covid: positive  4/7 Resp asp: G+ cocci and G-rods; normal flora  4/15 UCx: NGF  4/15 BCx 2/4 staph epi (MRSE on BCID; 1/2 from each set) 4/18 BCx: NGF   Thank you for allowing pharmacy to be a part of this patient's care.  Gretta Arab PharmD, BCPS 06/14/2018 10:18 AM

## 2018-06-14 NOTE — Progress Notes (Signed)
PROGRESS NOTE    Shannon Carroll  YOV:[id number] DOB: 01-Jul-1950 DOA: 05/20/2018 PCP: Harlan Stains, MD   Brief Narrative:  63 yr WF PMHx Chronic Diastolic (EF 74%), COPD, CVA, SVT, essential hypertension, obstructive sleep apnea, morbid obesity,   Presented to the hospital with acute shortness of breath was diagnosed with COVID-19 infection, went into acute hypoxic respiratory failure intubated and kept in ICU by pulmonary critical care. Transferred to [facility] ICU unit on 06/01/2018.     Subjective: Awake, intubated, does not follow commands, tries to mouth words  Assessment & Plan:   Principal Problem:   Multifocal pneumonia Active Problems:   Sleep apnea   Shortness of breath   Acute on chronic diastolic CHF (congestive heart failure) (HCC)   Hypertension   Stroke (HCC)   CKD (chronic kidney disease), stage III (HCC)   Hypokalemia   Abnormal LFTs   Acute respiratory failure (HCC)   Elevated troponin   SVT (supraventricular tachycardia) (HCC)   Acute on chronic respiratory failure (HCC)   Dementia, vascular (HCC)   ARDS (adult respiratory distress syndrome) ([place])   COVID-19 virus infection   Aspiration pneumonia of both lower lobes due to gastric secretions (HCC)   Acute respiratory failure with hypoxia/COVID-19 pneumonia -PCCM managing, weaning trials per critical care - Completed treatment with hydroxychloroquine and azithromycin -Clinically she is improving. Plans are for extubation to be performed tomorrow. Discussed plan with husband and son so that family member may be present  Aspiration pneumonia - 4/20 patient had witnessed aspiration - 4/21 PCXR consistent with aspiration pneumonia. - she is now afebrile - Completed 5-day course of unasyn  Septic shock - Patient back on Levophed to maintain adequate perfusion pressure. - Continues on low dose Levophed to maintain MAP>65   Staph Epidermidis Bacteremia  -Secondary to contaminated PICC  line?  Case discussed by Dr. Sherral Hammers with Dr. Prince Rome from ID who recommended treating with vancomycin through PICC line and repeating blood cultures.  If repeat blood cultures negative then could leave PICC line in. If positive, PICC line will need to be removed -Initial Blood cultures positive for staph epidermidis 2/2 -Repeat cultures have shown no growth -plan is to treat with a total of 14 days with vancomycin  Acute renal failure -Resolved - Monitor creatinine level Recent Labs  Lab 06/10/18 0500 06/11/18 0500 06/12/18 0135 06/13/18 0320 06/14/18 0157  CREATININE 0.93 0.90 0.92 0.93 0.88    Iron deficiency anemia, acute on chronic - Likely related to critical illness - Fecal occult pending, no signs of gross bleeding Recent Labs  Lab 06/10/18 0500 06/11/18 0500 06/12/18 0135 06/13/18 0320 06/14/18 0157  HGB 8.0* 8.1* 7.7* 8.2* 7.4*  - 4/21 transfused 1 unit PRBC  Chronic diastolic CHF -Strict in and out +10.8 L -Daily weight.   Filed Weights   06/12/18 0428 06/13/18 0500 06/14/18 0500  Weight: 134.1 kg 131.5 kg 131.2 kg  -started on intravenous lasix with good urine output -weight is trending down  SVT -Short burst SVT during CPAP on 4/14 resolved with beta-blockers continue as needed -Recent TTE shows EF of 65% with no WMA -she is continued on low dose metoprolol   Morbid obesity   OSA -Currently vented  Diabetes type 2 controlled with complication - 1/28 hemoglobin A1c= 5.2  HLD - LDL not within AHA/ADA guidelines - 4/20 increase Lipitor to 40 mg daily  Nutrition -Continue tube feeds   Hx CVA - Plavix - ASA 81 mg  Acute metabolic encephalopathy -Underlying dementia - Recent CVA x2  -Encephalopathy appears to be resolving.      DVT prophylaxis: Lovenox Code Status: DNR Family Communication: updated husband and son [name] over the phone 4/26 Disposition Plan: TBD   Consultants:  PCCM    Procedures/Significant Events:  4/21  PCXR-tube and catheter positions as described without pneumothorax. -Widespread airspace opacity bilaterally, similar to 1 day prior. Suspect combination of alveolar edema and pneumonia.  -Small pleural effusions noted. Pulmonary vascular congestion present.    I have personally reviewed and interpreted all radiology studies and my findings are as above.  VENTILATOR SETTINGS: Mode: PRVC Vt set 440 set rate: 30 FiO2 35% PEEP: 5    Cultures 4/3 blood RIGHT hand negative x2 4/5 MRSA by PCR negative 4/7 tracheal aspirate normal flora 4/15 urine negative 4/15 blood positive STAPHYLOCOCCUS EPIDERMIDIS 2/2 4/18 blood RIGHT hand NGTD 4/18 blood A-line NGTD    Antimicrobials: Anti-infectives (From admission, onward)   Start     Stop   06/07/18 1500  vancomycin (VANCOCIN) 1,500 mg in sodium chloride 0.9 % 500 mL IVPB         06/06/18 1500  vancomycin (VANCOCIN) 2,000 mg in sodium chloride 0.9 % 500 mL IVPB     06/06/18 1700   05/27/18 2200  ceFEPIme (MAXIPIME) 2 g in sodium chloride 0.9 % 100 mL IVPB  Status:  Discontinued     05/27/18 1400   05/27/18 1200  ceFEPIme (MAXIPIME) 2 g in sodium chloride 0.9 % 100 mL IVPB     05/27/18 1330   05/26/18 2200  azithromycin (ZITHROMAX) tablet 500 mg  Status:  Discontinued     05/26/18 1502   05/26/18 2200  azithromycin (ZITHROMAX) tablet 500 mg     05/27/18 2141   05/24/18 2200  hydroxychloroquine (PLAQUENIL) tablet 200 mg     05/28/18 1025   05/23/18 2300  azithromycin (ZITHROMAX) 500 mg in sodium chloride 0.9 % 250 mL IVPB  Status:  Discontinued     05/26/18 0715   05/23/18 2200  hydroxychloroquine (PLAQUENIL) tablet 400 mg     05/24/18 1114   05/23/18 1500  vancomycin (VANCOCIN) 1,250 mg in sodium chloride 0.9 % 250 mL IVPB  Status:  Discontinued     05/25/18 1141   05/23/18 0230  ceFEPIme (MAXIPIME) 2 g in sodium chloride 0.9 % 100 mL IVPB  Status:  Discontinued     05/25/18 1334   06/06/2018 1545  metroNIDAZOLE (FLAGYL) IVPB 500  mg     05/30/2018 1731   05/29/2018 1530  vancomycin (VANCOCIN) IVPB 1000 mg/200 mL premix     06/05/2018 1731   06/12/2018 1415  vancomycin (VANCOCIN) IVPB 1000 mg/200 mL premix     06/03/2018 1538   06/08/2018 1415  ceFEPIme (MAXIPIME) 2 g in sodium chloride 0.9 % 100 mL IVPB     05/24/2018 1509       Devices #7.5 ETT 4/4>>>   LINES / TUBES:      Continuous Infusions: . sodium chloride 10 mL/hr at 06/14/18 1200  . feeding supplement (OSMOLITE 1.2 CAL) 40 mL/hr at 06/14/18 1200  . norepinephrine (LEVOPHED) Adult infusion Stopped (06/14/18 0906)  . vancomycin Stopped (06/13/18 2201)     Objective: Vitals:   06/14/18 1500 06/14/18 1535 06/14/18 1600 06/14/18 1715  BP: (!) 112/91 (!) 121/91 (!) 80/68 (!) 88/67  Pulse: 90 83 76 74  Resp: (!) 24 (!) 25 (!) 25 (!) 25  Temp:  TempSrc:      SpO2: 92% 97% 96% 98%  Weight:      Height:        Intake/Output Summary (Last 24 hours) at 06/14/2018 1731 Last data filed at 06/14/2018 1200 Gross per 24 hour  Intake 1354.47 ml  Output 1150 ml  Net 204.47 ml   Filed Weights   06/12/18 0428 06/13/18 0500 06/14/18 0500  Weight: 134.1 kg 131.5 kg 131.2 kg   General exam: intubated, on vent Respiratory system: Clear to auscultation. Respiratory effort normal. Cardiovascular system:RRR. No murmurs, rubs, gallops. Gastrointestinal system: Abdomen is nondistended, soft and nontender. No organomegaly or masses felt. Normal bowel sounds heard. Central nervous system: awake, does not follow commands Extremities: 1+ edema bilaterally Skin: No rashes, lesions or ulcers Psychiatry: unable to assess.    Data Reviewed: Care during the described time interval was provided by me .  I have reviewed this patient's available data, including medical history, events of note, physical examination, and all test results as part of my evaluation.   CBC: Recent Labs  Lab 06/10/18 0500 06/11/18 0500 06/12/18 0135 06/13/18 0320 06/14/18 0157  WBC 12.1*  10.7* 6.9 7.6 7.0  HGB 8.0* 8.1* 7.7* 8.2* 7.4*  HCT 28.7* 28.7* 27.6* 29.0* 26.1*  MCV 92.9 92.9 92.0 92.9 92.2  PLT 265 251 229 252 563   Basic Metabolic Panel: Recent Labs  Lab 06/09/18 0255 06/10/18 0500 06/11/18 0500 06/12/18 0135 06/13/18 0320 06/14/18 0157  NA 144 144 142 142 142 144  K 3.7 3.3* 3.8 3.5 3.5 3.0*  CL 114* 110 109 107 106 109  CO2 _0 GLUCOSE 136* 113* 115* 136* 126* 132*  BUN 27* 33* 31* 28* 21 23  CREATININE 0.86 0.93 0.90 0.92 0.93 0.88  CALCIUM 6.6* 7.4* 7.4* 7.2* 7.5* 7.3*  MG 1.9 2.5* 2.4 2.2 2.2  --   PHOS  --   --   --  3.5  --   --    GFR: Estimated Creatinine Clearance: 88.9 mL/min (by C-G formula based on SCr of 0.88 mg/dL). Liver Function Tests: No results for input(s): AST, ALT, ALKPHOS, BILITOT, PROT, ALBUMIN in the last 168 hours. No results for input(s): LIPASE, AMYLASE in the last 168 hours. No results for input(s): AMMONIA in the last 168 hours. Coagulation Profile: No results for input(s): INR, PROTIME in the last 168 hours. Cardiac Enzymes: No results for input(s): CKTOTAL, CKMB, CKMBINDEX, TROPONINI in the last 168 hours. BNP (last 3 results) No results for input(s): PROBNP in the last 8760 hours. HbA1C: No results for input(s): HGBA1C in the last 72 hours. CBG: Recent Labs  Lab 06/13/18 2256 06/14/18 0332 06/14/18 0755 06/14/18 1143 06/14/18 1544  GLUCAP 136* 134* 114* 129* 122*   Lipid Profile: No results for input(s): CHOL, HDL, LDLCALC, TRIG, CHOLHDL, LDLDIRECT in the last 72 hours. Thyroid Function Tests: No results for input(s): TSH, T4TOTAL, FREET4, T3FREE, THYROIDAB in the last 72 hours. Anemia Panel: No results for input(s): VITAMINB12, FOLATE, FERRITIN, TIBC, IRON, RETICCTPCT in the last 72 hours. Urine analysis:    Component Value Date/Time   COLORURINE YELLOW 06/03/2018 1347   APPEARANCEUR CLEAR 06/03/2018 1347   APPEARANCEUR Cloudy (A) 03/04/2018 1123   LABSPEC 1.019 06/03/2018 1347    PHURINE 8.0 06/03/2018 [place] 06/03/2018 [place] 06/03/2018 [place] 06/03/2018 1347   BILIRUBINUR Negative 03/04/2018 [place] 06/03/2018 [place]  30 (A) 06/03/2018 1347   NITRITE NEGATIVE 06/03/2018 1347   LEUKOCYTESUR NEGATIVE 06/03/2018 1347   Sepsis Labs: _0 (procalcitonin:4,lacticidven:4)  ) Recent Results (from the past 240 hour(s))  Culture, blood (Routine X 2) w Reflex to ID Panel     Status: None   Collection Time: 06/06/18  1:43 PM  Result Value Ref Range Status   Specimen Description   Final    BLOOD RIGHT HAND Performed at [facility] [address], [place], [place] [address]    Special Requests   Final    BOTTLES DRAWN AEROBIC ONLY Blood Culture results may not be optimal due to an inadequate volume of blood received in culture bottles Performed at [facility] [address], [place], [place] [address]    Culture   Final    NO GROWTH 5 DAYS Performed at [facility], [place] [address], [place], [place] [address]    Report Status 06/11/2018 FINAL  Final  Culture, blood (Routine X 2) w Reflex to ID Panel     Status: None   Collection Time: 06/06/18  1:48 PM  Result Value Ref Range Status   Specimen Description   Final    BLOOD A-LINE Performed at [facility], [place] [address] [place], [place] [address]    Special Requests   Final    BOTTLES DRAWN AEROBIC ONLY Blood Culture adequate volume Performed at [facility] [address] [place], [place] [address]    Culture   Final    NO GROWTH 5 DAYS Performed at [facility], [place] [address], [place], [place] [address]    Report Status 06/11/2018 FINAL  Final         Radiology Studies: Dg Abd Portable 1v  Result Date: 06/13/2018 CLINICAL DATA:  Evaluate OG tube EXAM: PORTABLE ABDOMEN - 1 VIEW COMPARISON:  June 13, 2018 FINDINGS: The OG tube still  terminates below today's film in the right lower abdomen. I suspect the tube is within the duodenum. A left-sided pleural effusion with underlying opacity remains. No other acute abnormalities. IMPRESSION: 1. The OG tube terminates below today's film, in the lower right abdomen. The tube is thought to terminate within the duodenum. Recommend further withdrawal of 8 cm. 2. Effusion and underlying opacity in left base. Electronically Signed   By: Dorise Bullion III M.D   On: 06/13/2018 07:59   Dg Abd Portable 1v  Result Date: 06/13/2018 CLINICAL DATA:  Evaluate OG tube placement EXAM: PORTABLE ABDOMEN - 1 VIEW COMPARISON:  None. FINDINGS: The OG tube terminates below today's film, likely in the duodenum. IMPRESSION: The OG tube terminates below today's film and appears to exit the stomach into the duodenum. Recommend repositioning with withdrawal of at least 8 cm. Electronically Signed   By: Dorise Bullion III M.D   On: 06/13/2018 05:20        Scheduled Meds: . atorvastatin  40 mg Per Tube q1800  . chlorhexidine gluconate (MEDLINE KIT)  15 mL Mouth Rinse BID  . Chlorhexidine Gluconate Cloth  6 each Topical Daily  . clopidogrel  75 mg Per Tube Daily  . docusate  50 mg Oral BID  . enoxaparin (LOVENOX) injection  60 mg Subcutaneous Q24H  . feeding supplement (PRO-STAT SUGAR FREE 64)  60 mL Per Tube TID WC  . fentaNYL  1 patch Transdermal Q72H  . free water  200 mL Per Tube Q6H  . furosemide  40 mg Intravenous  Daily  . insulin aspart  0-15 Units Subcutaneous Q4H  . mouth rinse  15 mL Mouth Rinse 10 times per day  . metoprolol tartrate  12.5 mg Per Tube BID  . pantoprazole sodium  40 mg Per Tube Daily  . sennosides  5 mL Oral QHS  . silver sulfADIAZINE  1 application Topical Daily  . sodium chloride flush  10-40 mL Intracatheter Q12H  . sodium chloride flush  3 mL Intravenous Q12H  . venlafaxine  37.5 mg Per Tube QHS  . venlafaxine  75 mg Per Tube q morning - 10a   Continuous Infusions:  . sodium chloride 10 mL/hr at 06/14/18 1200  . feeding supplement (OSMOLITE 1.2 CAL) 40 mL/hr at 06/14/18 1200  . norepinephrine (LEVOPHED) Adult infusion Stopped (06/14/18 0906)  . vancomycin Stopped (06/13/18 2201)     LOS: 23 days   The patient is critically ill with multiple organ systems failure and requires high complexity decision making for assessment and support, frequent evaluation and titration of therapies, application of advanced monitoring technologies and extensive interpretation of multiple databases. Critical Care Time devoted to patient care services described in this note  Time spent: 40 minutes    Kathie Dike, MD [facility]  If 7PM-7AM, please contact night-coverage [email or web address]  06/14/2018, 5:31 PM

## 2018-06-14 NOTE — Progress Notes (Signed)
[place] Progress Note Patient Name: Shannon Carroll DOB: Jul 02, 1950 MRN: [id number]   Date of Service  06/14/2018  HPI/Events of Note  Hypokalemia  eICU Interventions  Potassium replaced     Intervention Category Intermediate Interventions: Electrolyte abnormality - evaluation and management  Raekwon Winkowski 06/14/2018, 4:32 AM

## 2018-06-14 NOTE — Progress Notes (Signed)
NAME:  Shannon Carroll, MRN:  [id number], DOB:  [birth year], LOS: 23 ADMISSION DATE:  05/28/2018, CONSULTATION DATE:  05/23/2018 REFERRING MD:  Glenna Durand, CHIEF COMPLAINT:  Dyspnea   Brief History   68 y/o female with mild dementia, HTN admitted with COVID pneumonia and ARDS.     Past Medical History  CVA HFpEF COPD Mild dementia per family  [facility] Events   4/3 + COVID -19  4/4 ICU admit intubated  4/12 weaning trial, failed, de-sat, SVT  4/15 episodes of SVT 4/20 aspiration episode  Consults:  PCCM  Procedures:  ETT- 4/4 >> PICC LUE 4/11 >>  Significant Diagnostic Tests:    Micro Data:  Blood cx 05/20/2018- NGTD Novel Coronavirus Positive result called 4/4. Tracheal aspirate 4/7>>> nml flora  4/15 blood > staph epidermidis 2/4 bottles 4/15 urine > neg 4/18 blood > ng  Antimicrobials:  Vancomycin- 05/23/2018 Cefepime- 05/23/2018-4/6, 4/8>>  Hydroxychloroquine- upon transfer to ICU -COVID (to end 4/9) Axithromycin - upon transfer to ICU- COVID (to end 4/8)  4/18 vancomycin >>  4/20 unasyn >> 4/25  Interim history/subjective:   More awake and alert this morning Breathing comfortably on SBT 15/5 PSV  Objective   Blood pressure (!) 112/95, pulse 92, temperature 98.9 F (37.2 C), temperature source Axillary, resp. rate (!) 27, height 5\' 8"  (1.727 m), weight 131.2 kg, SpO2 98 %.    Vent Mode: PSV;CPAP FiO2 (%):  [30 %-35 %] 35 % Set Rate:  [25 bmp] 25 bmp Vt Set:  [440 mL] 440 mL PEEP:  [5 cmH20-15 cmH20] 15 cmH20 Pressure Support:  [15 cmH20] 15 cmH20 Plateau Pressure:  [21 cmH20-27 cmH20] 21 cmH20   Intake/Output Summary (Last 24 hours) at 06/14/2018 0830 Last data filed at 06/14/2018 0600 Gross per 24 hour  Intake 1374.93 ml  Output 3200 ml  Net -1825.07 ml   Filed Weights   06/12/18 0428 06/13/18 0500 06/14/18 0500  Weight: 134.1 kg 131.5 kg 131.2 kg    Examination:  General:  In bed on vent HENT: NCAT ETT in place PULM: CTA B, vent  supported breathing CV: RRR, no mgr GI: BS+, soft, nontender MSK: normal bulk and tone Neuro: awake on vent, nods head with questions   CXR personally reviewed (lung windows from abdominal film): persistent bilateral airspace disease; reviewing old CXR's shows significant worsening after 4/18 film  Resolved Hospital Problem list   Aspiration pneumonia  Assessment & Plan:  ARDS from COVID-19, oxygenation improving  Oxygenation improved Weaning on ventilator today More awake and alert Hopeful for one-way extubation in next 24 hours, I think family should be present At this point I think she is as close as possible for a successful one-way extubation but her risk of respiratory failure after extubation remains quite high due to her physical deconditioning, obesity, and multiple medical problems As such, I think that it is a good idea for her family to be present in case we do have to proceed with comfort measures if she develops worsening respiratory failure. Continue ventilator associated pneumonia prevention protocol   Shock:  Mostly due to sedation, lasix Continue Lasix for now Continue Levophed to maintain mean arterial pressure greater than 65    Staph epidermidis bacteremia:  Plan vancomycin for 14 days  Will contact family today  Best practice:  Per [facility]   Labs   CBC: Recent Labs  Lab 06/10/18 0500 06/11/18 0500 06/12/18 0135 06/13/18 0320 06/14/18 0157  WBC 12.1* 10.7* 6.9 7.6 7.0  HGB 8.0*  8.1* 7.7* 8.2* 7.4*  HCT 28.7* 28.7* 27.6* 29.0* 26.1*  MCV 92.9 92.9 92.0 92.9 92.2  PLT 265 251 229 252 921    Basic Metabolic Panel: Recent Labs  Lab 06/09/18 0255 06/10/18 0500 06/11/18 0500 06/12/18 0135 06/13/18 0320 06/14/18 0157  NA 144 144 142 142 142 144  K 3.7 3.3* 3.8 3.5 3.5 3.0*  CL 114* 110 109 107 106 109  CO2 22 26 27 27 29 29   GLUCOSE 136* 113* 115* 136* 126* 132*  BUN 27* 33* 31* 28* 21 23  CREATININE 0.86 0.93 0.90 0.92 0.93 0.88   CALCIUM 6.6* 7.4* 7.4* 7.2* 7.5* 7.3*  MG 1.9 2.5* 2.4 2.2 2.2  --   PHOS  --   --   --  3.5  --   --    GFR: Estimated Creatinine Clearance: 88.9 mL/min (by C-G formula based on SCr of 0.88 mg/dL). Recent Labs  Lab 06/11/18 0500 06/12/18 0135 06/13/18 0320 06/14/18 0157  WBC 10.7* 6.9 7.6 7.0    Liver Function Tests: No results for input(s): AST, ALT, ALKPHOS, BILITOT, PROT, ALBUMIN in the last 168 hours. No results for input(s): LIPASE, AMYLASE in the last 168 hours. No results for input(s): AMMONIA in the last 168 hours.  ABG    Component Value Date/Time   PHART 7.392 06/08/2018 1629   PCO2ART 44.2 06/08/2018 1629   PO2ART 71.0 (L) 06/08/2018 1629   HCO3 26.4 06/08/2018 1629   TCO2 28 06/08/2018 1629   O2SAT 92.0 06/08/2018 1629     Coagulation Profile: No results for input(s): INR, PROTIME in the last 168 hours.  Cardiac Enzymes: No results for input(s): CKTOTAL, CKMB, CKMBINDEX, TROPONINI in the last 168 hours.  HbA1C: Hgb A1c MFr Bld  Date/Time Value Ref Range Status  03/02/2018 04:43 PM 5.2 4.8 - 5.6 % Final    Comment:             Prediabetes: 5.7 - 6.4          Diabetes: >6.4          Glycemic control for adults with diabetes: <7.0     CBG: Recent Labs  Lab 06/13/18 1529 06/13/18 1949 06/13/18 2256 06/14/18 0332 06/14/18 0755  GLUCAP 75 120* 136* 134* 114*     Critical care time: 31 minutes    Roselie Awkward, MD [place] [facility] Pager: [phone] Cell: [phone] If no response, call [phone]

## 2018-06-15 LAB — CBC
HCT: 27.2 % — ABNORMAL LOW (ref 36.0–46.0)
Hemoglobin: 7.6 g/dL — ABNORMAL LOW (ref 12.0–15.0)
MCH: 26.2 pg (ref 26.0–34.0)
MCHC: 27.9 g/dL — ABNORMAL LOW (ref 30.0–36.0)
MCV: 93.8 fL (ref 80.0–100.0)
Platelets: 239 10*3/uL (ref 150–400)
RBC: 2.9 MIL/uL — ABNORMAL LOW (ref 3.87–5.11)
RDW: 21.2 % — ABNORMAL HIGH (ref 11.5–15.5)
WBC: 6.4 10*3/uL (ref 4.0–10.5)
nRBC: 0 % (ref 0.0–0.2)

## 2018-06-15 LAB — BASIC METABOLIC PANEL
Anion gap: 7 (ref 5–15)
BUN: 27 mg/dL — ABNORMAL HIGH (ref 8–23)
CO2: 28 mmol/L (ref 22–32)
Calcium: 7.4 mg/dL — ABNORMAL LOW (ref 8.9–10.3)
Chloride: 109 mmol/L (ref 98–111)
Creatinine, Ser: 0.87 mg/dL (ref 0.44–1.00)
GFR calc Af Amer: >60 mL/min (ref >60)
GFR calc non Af Amer: >60 mL/min (ref >60)
Glucose, Bld: 102 mg/dL — ABNORMAL HIGH (ref 70–99)
Potassium: 3.5 mmol/L (ref 3.5–5.1)
Sodium: 144 mmol/L (ref 135–145)

## 2018-06-15 LAB — MAGNESIUM: Magnesium: 2.2 mg/dL (ref 1.7–2.4)

## 2018-06-15 LAB — GLUCOSE, CAPILLARY
Glucose-Capillary: 110 mg/dL — ABNORMAL HIGH (ref 70–99)
Glucose-Capillary: 122 mg/dL — ABNORMAL HIGH (ref 70–99)
Glucose-Capillary: 77 mg/dL (ref 70–99)
Glucose-Capillary: 90 mg/dL (ref 70–99)

## 2018-06-15 LAB — VANCOMYCIN, PEAK: Vancomycin Pk: 36 ug/mL (ref 30–40)

## 2018-06-15 MED ORDER — FENTANYL 2500MCG IN NS 250ML (10MCG/ML) PREMIX INFUSION
0.0000 ug/h | INTRAVENOUS | Status: DC
  Administered 2018-06-15: 25 ug/h via INTRAVENOUS
  Filled 2018-06-15: qty 250

## 2018-06-19 NOTE — Progress Notes (Signed)
PROGRESS NOTE    Shannon Carroll  GQB:[id number] DOB: 09-09-1950 DOA: 05/23/2018 PCP: Harlan Stains, MD   Brief Narrative:  20 yr WF PMHx Chronic Diastolic (EF 82%), COPD, CVA, SVT, essential hypertension, obstructive sleep apnea, morbid obesity,   Presented to the hospital with acute shortness of breath was diagnosed with COVID-19 infection, went into acute hypoxic respiratory failure intubated and kept in ICU by pulmonary critical care. Transferred to [facility] ICU unit on 06/01/2018.   Patient has had a prolonged course. Due to her multiple comorbidities, after discussion with family, it was decided to perform one way extubation on 4/20, but she experienced a significant aspiration event. Since that time, she has slowly improved and plans are for extubation on 4/27.  Subjective: Awake, intubated  Assessment & Plan:   Principal Problem:   Multifocal pneumonia Active Problems:   Sleep apnea   Shortness of breath   Acute on chronic diastolic CHF (congestive heart failure) (HCC)   Hypertension   Stroke (HCC)   CKD (chronic kidney disease), stage III (HCC)   Hypokalemia   Abnormal LFTs   Acute respiratory failure (HCC)   Elevated troponin   SVT (supraventricular tachycardia) (HCC)   Acute on chronic respiratory failure (HCC)   Dementia, vascular (HCC)   ARDS (adult respiratory distress syndrome) ([place])   COVID-19 virus infection   Aspiration pneumonia of both lower lobes due to gastric secretions (HCC)   Acute respiratory failure with hypoxia/COVID-19 pneumonia -PCCM managing, weaning trials per critical care - Completed treatment with hydroxychloroquine and azithromycin -Clinically she is improving. Plans are for extubation today with no reintubation. Family is aware of plan and will Facetime with patient during process -if patient becomes short of breath/increased work of breathing, will palliative with narcotics. -will keep patient NPO until speech therapy  can evaluate for dysphagia  Aspiration pneumonia - 4/20 patient had witnessed aspiration - 4/21 PCXR consistent with aspiration pneumonia. - she is now afebrile - Completed 5-day course of unasyn -speech therapy evaluation once extubated  Septic shock - she has been intermittently on levophed. - Continues on low dose Levophed to maintain MAP>65   Staph Epidermidis Bacteremia  -Secondary to contaminated PICC line?  Case discussed by Dr. Sherral Hammers with Dr. Prince Rome from ID who recommended treating with vancomycin through PICC line and repeating blood cultures.  If repeat blood cultures negative then could leave PICC line in. If positive, PICC line will need to be removed -Initial Blood cultures positive for staph epidermidis 2/2 -Repeat cultures have shown no growth -plan is to treat with a total of 14 days with vancomycin  Acute renal failure -Resolved - Monitor creatinine level Recent Labs  Lab 06/11/18 0500 06/12/18 0135 06/13/18 0320 06/14/18 0157 06/22/18 0219  CREATININE 0.90 0.92 0.93 0.88 0.87    Iron deficiency anemia, acute on chronic - Likely related to critical illness - Fecal occult pending, no signs of gross bleeding Recent Labs  Lab 06/11/18 0500 06/12/18 0135 06/13/18 0320 06/14/18 0157 2018-06-22 0219  HGB 8.1* 7.7* 8.2* 7.4* 7.6*  - 4/21 transfused 1 unit PRBC  Chronic diastolic CHF -Strict in and out +9.7 L -Daily weight.   Filed Weights   06/13/18 0500 06/14/18 0500 06/22/2018 0500  Weight: 131.5 kg 131.2 kg 132 kg  -started on intravenous lasix with good urine output -weight is trending down  SVT -Short burst SVT during CPAP on 4/14 resolved with beta-blockers continue as needed -Recent TTE shows EF of 65% with no WMA -  she is continued on low dose metoprolol   Morbid obesity   OSA -Currently vented  Diabetes type 2 controlled with complication - 4/49 hemoglobin A1c= 5.2  HLD - LDL not within AHA/ADA guidelines - 4/20 increase Lipitor to 40  mg daily  Nutrition -Continue tube feeds   Hx CVA - Plavix - ASA 81 mg    Acute metabolic encephalopathy -Underlying dementia - Recent CVA x2  -Encephalopathy appears to be resolving.     DVT prophylaxis: Lovenox Code Status: DNR Family Communication: family will call in today to facetime during extubation Disposition Plan: TBD   Consultants:  PCCM    Procedures/Significant Events:  4/21 PCXR-tube and catheter positions as described without pneumothorax. -Widespread airspace opacity bilaterally, similar to 1 day prior. Suspect combination of alveolar edema and pneumonia.  -Small pleural effusions noted. Pulmonary vascular congestion present.    I have personally reviewed and interpreted all radiology studies and my findings are as above.  VENTILATOR SETTINGS: Vent Mode: PRVC FiO2 (%):  [35 %] 35 % Set Rate:  [25 bmp] 25 bmp Vt Set:  [440 mL] 440 mL PEEP:  [5 cmH20] 5 cmH20 Plateau Pressure:  [21 cmH20-26 cmH20] 21 cmH20    Cultures 4/3 blood RIGHT hand negative x2 4/5 MRSA by PCR negative 4/7 tracheal aspirate normal flora 4/15 urine negative 4/15 blood positive STAPHYLOCOCCUS EPIDERMIDIS 2/2 4/18 blood RIGHT hand NGTD 4/18 blood A-line NGTD    Antimicrobials: Anti-infectives (From admission, onward)   Start     Stop   06/07/18 1500  vancomycin (VANCOCIN) 1,500 mg in sodium chloride 0.9 % 500 mL IVPB         06/06/18 1500  vancomycin (VANCOCIN) 2,000 mg in sodium chloride 0.9 % 500 mL IVPB     06/06/18 1700   05/27/18 2200  ceFEPIme (MAXIPIME) 2 g in sodium chloride 0.9 % 100 mL IVPB  Status:  Discontinued     05/27/18 1400   05/27/18 1200  ceFEPIme (MAXIPIME) 2 g in sodium chloride 0.9 % 100 mL IVPB     05/27/18 1330   05/26/18 2200  azithromycin (ZITHROMAX) tablet 500 mg  Status:  Discontinued     05/26/18 1502   05/26/18 2200  azithromycin (ZITHROMAX) tablet 500 mg     05/27/18 2141   05/24/18 2200  hydroxychloroquine (PLAQUENIL) tablet 200 mg      05/28/18 1025   05/23/18 2300  azithromycin (ZITHROMAX) 500 mg in sodium chloride 0.9 % 250 mL IVPB  Status:  Discontinued     05/26/18 0715   05/23/18 2200  hydroxychloroquine (PLAQUENIL) tablet 400 mg     05/24/18 1114   05/23/18 1500  vancomycin (VANCOCIN) 1,250 mg in sodium chloride 0.9 % 250 mL IVPB  Status:  Discontinued     05/25/18 1141   05/23/18 0230  ceFEPIme (MAXIPIME) 2 g in sodium chloride 0.9 % 100 mL IVPB  Status:  Discontinued     05/25/18 1334   05/23/2018 1545  metroNIDAZOLE (FLAGYL) IVPB 500 mg     06/10/2018 1731   06/18/2018 1530  vancomycin (VANCOCIN) IVPB 1000 mg/200 mL premix     06/04/2018 1731   06/06/2018 1415  vancomycin (VANCOCIN) IVPB 1000 mg/200 mL premix     05/30/2018 1538   06/12/2018 1415  ceFEPIme (MAXIPIME) 2 g in sodium chloride 0.9 % 100 mL IVPB     06/18/2018 1509       Devices #7.5 ETT 4/4>>>   LINES / TUBES:  Continuous Infusions: . sodium chloride 10 mL/hr at 2018/07/06 0600  . feeding supplement (OSMOLITE 1.2 CAL) 40 mL/hr at 06/14/18 1200  . fentaNYL infusion INTRAVENOUS 50 mcg/hr (2018-07-06 1312)  . norepinephrine (LEVOPHED) Adult infusion Stopped (06/14/18 1437)  . vancomycin Stopped (06/14/18 2140)     Objective: Vitals:   Jul 06, 2018 0900 07/06/2018 1000 07/06/2018 1100 07-06-18 1121  BP: _0   Pulse: 72 (!) 58 71   Resp: (!) 25 (!) 25 (!) 25   Temp:      TempSrc:      SpO2: 99% 97% 98% 97%  Weight:      Height:        Intake/Output Summary (Last 24 hours) at 06-Jul-2018 1355 Last data filed at Jul 06, 2018 1121 Gross per 24 hour  Intake 944.71 ml  Output 1400 ml  Net -455.29 ml   Filed Weights   06/13/18 0500 06/14/18 0500 07/06/18 0500  Weight: 131.5 kg 131.2 kg 132 kg   General exam: intubated, awake Respiratory system: Clear to auscultation. Respiratory effort normal. Cardiovascular system:RRR. No murmurs, rubs, gallops. Gastrointestinal system: Abdomen is nondistended, soft and nontender. No organomegaly  or masses felt. Normal bowel sounds heard. Central nervous system: awake, does not follow commands. Extremities: 1+ edema bilaterally Skin: No rashes, lesions or ulcers Psychiatry: cannot assess    Data Reviewed: Care during the described time interval was provided by me .  I have reviewed this patient's available data, including medical history, events of note, physical examination, and all test results as part of my evaluation.   CBC: Recent Labs  Lab 06/11/18 0500 06/12/18 0135 06/13/18 0320 06/14/18 0157 07-06-18 0219  WBC 10.7* 6.9 7.6 7.0 6.4  HGB 8.1* 7.7* 8.2* 7.4* 7.6*  HCT 28.7* 27.6* 29.0* 26.1* 27.2*  MCV 92.9 92.0 92.9 92.2 93.8  PLT 251 229 252 236 101   Basic Metabolic Panel: Recent Labs  Lab 06/10/18 0500 06/11/18 0500 06/12/18 0135 06/13/18 0320 06/14/18 0157 Jul 06, 2018 0219  NA 144 142 142 142 144 144  K 3.3* 3.8 3.5 3.5 3.0* 3.5  CL 110 109 107 106 109 109  CO2 _1 GLUCOSE 113* 115* 136* 126* 132* 102*  BUN 33* 31* 28* 21 23 27*  CREATININE 0.93 0.90 0.92 0.93 0.88 0.87  CALCIUM 7.4* 7.4* 7.2* 7.5* 7.3* 7.4*  MG 2.5* 2.4 2.2 2.2  --  2.2  PHOS  --   --  3.5  --   --   --    GFR: Estimated Creatinine Clearance: 90.2 mL/min (by C-G formula based on SCr of 0.87 mg/dL). Liver Function Tests: No results for input(s): AST, ALT, ALKPHOS, BILITOT, PROT, ALBUMIN in the last 168 hours. No results for input(s): LIPASE, AMYLASE in the last 168 hours. No results for input(s): AMMONIA in the last 168 hours. Coagulation Profile: No results for input(s): INR, PROTIME in the last 168 hours. Cardiac Enzymes: No results for input(s): CKTOTAL, CKMB, CKMBINDEX, TROPONINI in the last 168 hours. BNP (last 3 results) No results for input(s): PROBNP in the last 8760 hours. HbA1C: No results for input(s): HGBA1C in the last 72 hours. CBG: Recent Labs  Lab 06/14/18 2015 Jul 06, 2018 0030 07-06-2018 0410 Jul 06, 2018 0752 07/06/2018 1129  GLUCAP 131* 122*  110* 90 77   Lipid Profile: No results for input(s): CHOL, HDL, LDLCALC, TRIG, CHOLHDL, LDLDIRECT in the last 72 hours. Thyroid Function Tests: No results for input(s): TSH, T4TOTAL, FREET4, T3FREE, THYROIDAB in the last 72 hours.  Anemia Panel: No results for input(s): VITAMINB12, FOLATE, FERRITIN, TIBC, IRON, RETICCTPCT in the last 72 hours. Urine analysis:    Component Value Date/Time   COLORURINE YELLOW 06/03/2018 1347   APPEARANCEUR CLEAR 06/03/2018 1347   APPEARANCEUR Cloudy (A) 03/04/2018 1123   LABSPEC 1.019 06/03/2018 1347   PHURINE 8.0 06/03/2018 1347   GLUCOSEU NEGATIVE 06/03/2018 1347   HGBUR NEGATIVE 06/03/2018 1347   BILIRUBINUR NEGATIVE 06/03/2018 1347   BILIRUBINUR Negative 03/04/2018 1123   KETONESUR NEGATIVE 06/03/2018 1347   PROTEINUR 30 (A) 06/03/2018 1347   NITRITE NEGATIVE 06/03/2018 1347   LEUKOCYTESUR NEGATIVE 06/03/2018 1347   Sepsis Labs: _0 (procalcitonin:4,lacticidven:4)  ) Recent Results (from the past 240 hour(s))  Culture, blood (Routine X 2) w Reflex to ID Panel     Status: None   Collection Time: 06/06/18  1:43 PM  Result Value Ref Range Status   Specimen Description   Final    BLOOD RIGHT HAND Performed at [facility] [address], [place], [place] [address]    Special Requests   Final    BOTTLES DRAWN AEROBIC ONLY Blood Culture results may not be optimal due to an inadequate volume of blood received in culture bottles Performed at [facility] [address] [place], [place] [address]    Culture   Final    NO GROWTH 5 DAYS Performed at [facility], [place] [address] [place], [place] [address]    Report Status 06/11/2018 FINAL  Final  Culture, blood (Routine X 2) w Reflex to ID Panel     Status: None   Collection Time: 06/06/18  1:48 PM  Result Value Ref Range Status   Specimen Description   Final    BLOOD A-LINE Performed at [facility], [place] [address], [place],  [place] [address]    Special Requests   Final    BOTTLES DRAWN AEROBIC ONLY Blood Culture adequate volume Performed at [facility] [address], [place], [place] [address]    Culture   Final    NO GROWTH 5 DAYS Performed at [facility], [place] [address], [place], [place] [address]    Report Status 06/11/2018 FINAL  Final         Radiology Studies: No results found.      Scheduled Meds: . atorvastatin  40 mg Per Tube q1800  . chlorhexidine gluconate (MEDLINE KIT)  15 mL Mouth Rinse BID  . Chlorhexidine Gluconate Cloth  6 each Topical Daily  . clopidogrel  75 mg Per Tube Daily  . docusate  50 mg Oral BID  . enoxaparin (LOVENOX) injection  60 mg Subcutaneous Q24H  . feeding supplement (PRO-STAT SUGAR FREE 64)  60 mL Per Tube TID WC  . fentaNYL  1 patch Transdermal Q72H  . free water  200 mL Per Tube Q6H  . furosemide  40 mg Intravenous Daily  . insulin aspart  0-15 Units Subcutaneous Q4H  . mouth rinse  15 mL Mouth Rinse 10 times per day  . metoprolol tartrate  12.5 mg Per Tube BID  . pantoprazole sodium  40 mg Per Tube Daily  . sennosides  5 mL Oral QHS  . silver sulfADIAZINE  1 application Topical Daily  . sodium chloride flush  10-40 mL Intracatheter Q12H  . sodium chloride flush  3 mL Intravenous Q12H  . venlafaxine  37.5 mg Per Tube QHS  . venlafaxine  75 mg Per Tube q morning -  10a   Continuous Infusions: . sodium chloride 10 mL/hr at Jul 15, 2018 0600  . feeding supplement (OSMOLITE 1.2 CAL) 40 mL/hr at 06/14/18 1200  . fentaNYL infusion INTRAVENOUS 50 mcg/hr (Jul 15, 2018 1312)  . norepinephrine (LEVOPHED) Adult infusion Stopped (06/14/18 1437)  . vancomycin Stopped (06/14/18 2140)     LOS: 24 days   The patient is critically ill with multiple organ systems failure and requires high complexity decision making for assessment and support, frequent evaluation and titration of therapies, application of advanced monitoring technologies and extensive  interpretation of multiple databases. Critical Care Time devoted to patient care services described in this note  Time spent: 40 minutes    Kathie Dike, MD [facility]  If 7PM-7AM, please contact night-coverage [email or web address]  15-Jul-2018, 1:55 PM

## 2018-06-19 NOTE — Procedures (Signed)
**Note De-Identified Montell Leopard Obfuscation** Extubation Procedure Note  Patient Details:   Name: Shannon Carroll DOB: 10-30-1950 MRN: [id number]   Airway Documentation:    Vent end date: 06/25/2018 Vent end time: 1219   Evaluation  O2 sats: stable throughout Complications: No apparent complications/ abdominal breathing Patient did tolerate procedure well. Bilateral Breath Sounds: Clear, Diminished  + leak, no stridor. No  Adison Reifsteck, Penni Bombard Jun 25, 2018, 12:23 PM

## 2018-06-19 NOTE — Progress Notes (Signed)
NAME:  Shannon Carroll, MRN:  [id number], DOB:  1950/09/22, LOS: 24 ADMISSION DATE:  05/29/2018, CONSULTATION DATE:  05/23/2018 REFERRING MD:  Glenna Durand, CHIEF COMPLAINT:  Dyspnea   Brief History   68 y/o female with mild dementia, HTN admitted with COVID pneumonia and ARDS.     Past Medical History  CVA HFpEF COPD Mild dementia per family  [facility] Events   4/3 + COVID -19  4/4 ICU admit intubated  4/12 weaning trial, failed, de-sat, SVT  4/15 episodes of SVT 4/20 aspiration episode  Consults:  PCCM  Procedures:  ETT- 4/4 >> PICC LUE 4/11 >>  Significant Diagnostic Tests:    Micro Data:  Blood cx 06/18/2018- NGTD Novel Coronavirus Positive result called 4/4. Tracheal aspirate 4/7>>> nml flora  4/15 blood > staph epidermidis 2/4 bottles 4/15 urine > neg 4/18 blood > ng  Antimicrobials:  Vancomycin- 05/23/2018 Cefepime- 05/23/2018-4/6, 4/8>>  Hydroxychloroquine- upon transfer to ICU -COVID (to end 4/9) Axithromycin - upon transfer to ICU- COVID (to end 4/8)  4/18 vancomycin >>  4/20 unasyn >> 4/25  Interim history/subjective:   Weaned on vent all day yesterday No acute events overnight More awake and alert  Objective   Blood pressure 103/74, pulse 84, temperature 97.7 F (36.5 C), temperature source Oral, resp. rate 20, height 5\' 8"  (1.727 m), weight 132 kg, SpO2 91 %.    Vent Mode: PRVC FiO2 (%):  [35 %] 35 % Set Rate:  [25 bmp] 25 bmp Vt Set:  [440 mL] 440 mL PEEP:  [5 cmH20] 5 cmH20 Pressure Support:  [15 cmH20] 15 cmH20 Plateau Pressure:  [21 cmH20-26 cmH20] 21 cmH20   Intake/Output Summary (Last 24 hours) at 01-Jul-2018 0841 Last data filed at 07/01/2018 0800 Gross per 24 hour  Intake 1274.28 ml  Output 1800 ml  Net -525.72 ml   Filed Weights   06/13/18 0500 06/14/18 0500 2018-07-01 0500  Weight: 131.5 kg 131.2 kg 132 kg    Examination:  General:  In bed on vent HENT: NCAT ETT in place PULM: CTA B, vent supported breathing CV: RRR,  no mgr GI: BS+, soft, nontender MSK: normal bulk and tone Neuro: awake on vent       CXR personally reviewed (lung windows from abdominal film): persistent bilateral airspace disease; reviewing old CXR's shows significant worsening after 4/18 film  Resolved Hospital Problem list   Aspiration pneumonia  Assessment & Plan:  ARDS from COVID-19, oxygenation improving Aspiration pneumonia: completed treatment Ventilator mechanics and oxygenation favor extubation One-way extubation planned today, if she develops respiratory failure afterwards then we will focus on full comfort measures We have offered to the patient's family to come and visit but at this point they would prefer not to Ventilator associated pneumonia prevention protocol Continue lasix   Shock:  resolve    Staph epidermidis bacteremia:  Vanc 14 days   Best practice:  Per TRH   Labs   CBC: Recent Labs  Lab 06/11/18 0500 06/12/18 0135 06/13/18 0320 06/14/18 0157 07/01/18 0219  WBC 10.7* 6.9 7.6 7.0 6.4  HGB 8.1* 7.7* 8.2* 7.4* 7.6*  HCT 28.7* 27.6* 29.0* 26.1* 27.2*  MCV 92.9 92.0 92.9 92.2 93.8  PLT 251 229 252 236 426    Basic Metabolic Panel: Recent Labs  Lab 06/10/18 0500 06/11/18 0500 06/12/18 0135 06/13/18 0320 06/14/18 0157 2018-07-01 0219  NA 144 142 142 142 144 144  K 3.3* 3.8 3.5 3.5 3.0* 3.5  CL 110 109 107 106 109 109  CO2 26 27 27 29 29 28   GLUCOSE 113* 115* 136* 126* 132* 102*  BUN 33* 31* 28* 21 23 27*  CREATININE 0.93 0.90 0.92 0.93 0.88 0.87  CALCIUM 7.4* 7.4* 7.2* 7.5* 7.3* 7.4*  MG 2.5* 2.4 2.2 2.2  --  2.2  PHOS  --   --  3.5  --   --   --    GFR: Estimated Creatinine Clearance: 90.2 mL/min (by C-G formula based on SCr of 0.87 mg/dL). Recent Labs  Lab 06/12/18 0135 06/13/18 0320 06/14/18 0157 2018/07/12 0219  WBC 6.9 7.6 7.0 6.4    Liver Function Tests: No results for input(s): AST, ALT, ALKPHOS, BILITOT, PROT, ALBUMIN in the last 168 hours. No results for  input(s): LIPASE, AMYLASE in the last 168 hours. No results for input(s): AMMONIA in the last 168 hours.  ABG    Component Value Date/Time   PHART 7.392 06/08/2018 1629   PCO2ART 44.2 06/08/2018 1629   PO2ART 71.0 (L) 06/08/2018 1629   HCO3 26.4 06/08/2018 1629   TCO2 28 06/08/2018 1629   O2SAT 92.0 06/08/2018 1629     Coagulation Profile: No results for input(s): INR, PROTIME in the last 168 hours.  Cardiac Enzymes: No results for input(s): CKTOTAL, CKMB, CKMBINDEX, TROPONINI in the last 168 hours.  HbA1C: Hgb A1c MFr Bld  Date/Time Value Ref Range Status  03/02/2018 04:43 PM 5.2 4.8 - 5.6 % Final    Comment:             Prediabetes: 5.7 - 6.4          Diabetes: >6.4          Glycemic control for adults with diabetes: <7.0     CBG: Recent Labs  Lab 06/14/18 1544 06/14/18 2015 2018-07-12 0030 07/12/18 0410 07/12/18 0752  GLUCAP 122* 131* 122* 110* 90     Critical care time: 30 minutes    Roselie Awkward, MD [facility] Pager: [phone] Cell: [phone] If no response, call [phone]

## 2018-06-19 NOTE — Progress Notes (Signed)
Pt son, Tanyah Debruyne called and updated this RN that family will not be present for extubation today. Mitzi Hansen explained that he lives in [place], his father (patient's spouse) has multiple health problems (including HD today) that make it necessary for him to be extra prudent with social distancing, and pt's other son was not comfortable being present today. This RN assured Mitzi Hansen that she would keep him informed and updated as to the time of extubation, etc. Will continue to monitor.

## 2018-06-19 NOTE — Progress Notes (Signed)
Wasted aprrox 200 ml of fentanyl with Orie Fisherman, RN.

## 2018-06-19 NOTE — Progress Notes (Signed)
Pt family on conference via [facility] at time extubation (aprrox 1200) and for 2 hours and 30 mins total after extubation.

## 2018-06-19 NOTE — Discharge Summary (Signed)
Death Summary  Shannon Carroll MWU:[id number] DOB: 1950/09/26 DOA: 05-24-2018  PCP: Shannon Stains, MD  Admit date: 05-24-2018 Date of Death: 17-Jun-2018 Time of Death: 15:40  History of present illness:  Shannon Carroll is a 68 y.o. female with a history of chronic diastolic heart failure, previous stroke, vascular dementia, morbid obesity, diabetes, hyperlipidemia.  She was recently discharged from the hospital to skilled nursing facility on 3/31 after being treated for decompensated CHF, recurrent aspiration due to dysphagia.  She returned to the hospital with shortness of breath and was found to be in SVT with a heart rate of 160.  She was hypotensive with a systolic blood pressure in the 70s.  She did receive adenosine and Lopressor which improved her heart rate and blood pressure.  CT of the chest was performed that showed bilateral groundglass opacities consistent with viral etiology.  She had increasing oxygen requirement.  She was admitted for acute respiratory failure secondary to multifocal pneumonia and concerns for COVID-19.  Patient's COVID-19 test returned positive.  Unfortunately, her respiratory status continued to decline and she was intubated.  She was eventually transferred to [facility] on 4/13.  Patient had a difficult time weaning from the ventilator.  After several discussions with the patient's family, they decided that patient would not want to have a tracheostomy performed.  Decision was made to perform a one-way extubation once respiratory status was optimized.  This was initially planned for 4/20, but patient had a significant aspiration event.  After further management with IV antibiotics and pulmonary hygiene, respiratory status was optimized by 2022-06-17.  Patient was extubated, but unfortunately became short of breath.  She received medications including narcotics to treat her respiratory distress and provide comfort.  Unfortunately, the patient did expired at 15:30 on 2022/06/17.   Family was made aware.   Final Diagnoses:  Principal Problem:   Multifocal pneumonia Active Problems:   Sleep apnea   Shortness of breath   Acute on chronic diastolic CHF (congestive heart failure) (HCC)   Hypertension   Stroke (HCC)   CKD (chronic kidney disease), stage III (HCC)   Hypokalemia   Abnormal LFTs   Acute respiratory failure (HCC)   Elevated troponin   SVT (supraventricular tachycardia) (HCC)   Acute on chronic respiratory failure (HCC)   Dementia, vascular (HCC)   ARDS (adult respiratory distress syndrome) ([place])   COVID-19 virus infection   Aspiration pneumonia of both lower lobes due to gastric secretions (HCC)   Septic Shock   Staph Epidermidis Bacteremia   Acute renal failure   Anemia, Iron deficiency, acute on chronic   Morbid Obesity   Obstructive sleep apnea   Hyperlipidemia   Diabetes type 2, controlled, with complication   Acute metabolic encephalopathy      The results of significant diagnostics from this hospitalization (including imaging, microbiology, ancillary and laboratory) are listed below for reference.    Significant Diagnostic Studies: Ct Head Wo Contrast  Result Date: 06/05/2018 CLINICAL DATA:  68 y/o  F; COVID positive, encephalopathy. EXAM: CT HEAD WITHOUT CONTRAST TECHNIQUE: Contiguous axial images were obtained from the base of the skull through the vertex without intravenous contrast. COMPARISON:  03/31/2018 MRI of the head. FINDINGS: Brain: Motion degraded study. No evidence of acute infarction, hemorrhage, hydrocephalus, extra-axial collection or mass lesion/mass effect. Stable chronic infarct in the right frontal lobe. Stable nonspecific white matter hypodensities compatible with chronic microvascular ischemic changes and advanced volume loss of the brain. Dystrophic calcifications in the basal ganglia  and scattered in the right cerebral cortex. Vascular: Calcific atherosclerosis of carotid siphons. No hyperdense vessel identified.  Skull: Normal. Negative for fracture or focal lesion. Sinuses/Orbits: No acute finding. Other: Bilateral intra-ocular lens replacement. IMPRESSION: 1. Motion degraded study. No acute intracranial abnormality identified. 2. Stable chronic right frontal lobe infarct, chronic microvascular ischemic changes, and advanced volume loss of the brain. Electronically Signed   By: Kristine Garbe M.D.   On: 06/05/2018 22:08   Ct Chest Wo Contrast  Result Date: 05/21/2018 CLINICAL DATA:  68 year old female with shortness of breath, fever. Patient also has a history of congestive heart failure. EXAM: CT CHEST WITHOUT CONTRAST TECHNIQUE: Multidetector CT imaging of the chest was performed following the standard protocol without IV contrast. COMPARISON:  Chest x-ray obtained earlier today; prior chest x-ray 05/19/18 FINDINGS: Cardiovascular: Limited evaluation in the absence of intravenous contrast. Cardiomegaly. Calcification of the mitral valve annulus. Coronary artery calcifications also present. Atherosclerotic calcifications throughout the aorta. No pericardial effusion. Mediastinum/Nodes: Thyroid gland is unremarkable. Borderline enlarged mediastinal lymph nodes. Index right paratracheal lymph node measures 1.2 cm in short axis. Index low right paratracheal lymph node measures 1.2 cm in short axis. Unremarkable thoracic esophagus. Lungs/Pleura: Extensive patchy ground-glass and more consolidative airspace opacities in a peripheral and peribronchovascular distribution throughout all lobes of both lungs but preferentially within the upper lobes. There is relative sparing of the middle lobe. Somewhat less significant disease within the dependent portions of the right lower lobe makes aspiration less likely. Additionally, there is mild bilateral lower lobe atelectasis secondary to the presence of small bilateral pleural effusions. Upper Abdomen: Small volume perihepatic ascites. Relative right renal atrophy.  Musculoskeletal: No acute fracture or aggressive appearing lytic or blastic osseous lesion. IMPRESSION: 1. There are a spectrum of findings in the lungs which can be seen with acute atypical infection (as well as other non-infectious etiologies). In particular, viral pneumonia (including COVID-19) should be considered in the appropriate clinical setting. Given the upper lung predominant pattern of distribution, aspiration is considered significantly less likely despite the patient's underlying risk factors. Similarly, congestive heart failure is considered less likely given the absence of interlobular septal thickening. 2. Small to moderate bilateral layering pleural effusions. 3. Small volume perihepatic site ease of uncertain etiology. 4. Borderline enlarged mediastinal lymph nodes are favored to be reactive. 5. Coronary artery calcifications. 6.  Aortic Atherosclerosis (ICD10-170.0). 7. Relative right renal atrophy. These results were called by telephone at the time of interpretation on 05/29/2018 at 4:42 pm to Dr. Evangeline Gula, who verbally acknowledged these results. Electronically Signed   By: Jacqulynn Cadet M.D.   On: 05/29/2018 16:43   Dg Chest Port 1 View  Result Date: 06/12/2018 CLINICAL DATA:  68 year old female COVID-4. Respiratory failure. EXAM: PORTABLE CHEST 1 VIEW COMPARISON:  06/09/2018 and earlier. FINDINGS: Portable AP semi upright view at 0412 hours. Stable endotracheal tube tip between the level the clavicles and carina. Enteric tube courses to the abdomen, tip not included. Stable left PICC or subclavian central line. Widespread Patchy and confluent bilateral pulmonary opacity with continued dense retrocardiac involvement. No pneumothorax. Suggestion of veiling opacity at both lung bases suspicious for pleural effusion. Ventilation has not significantly changed since 06/08/2018. Stable cardiomegaly and mediastinal contours. Paucity of bowel gas. IMPRESSION: 1. Stable lines and tubes. 2. Stable  ventilation since 06/08/18 with bilateral widespread bilateral pulmonary opacity, dense lower lobe collapse or consolidation, and possible pleural effusions. Electronically Signed   By: Genevie Ann M.D.   On: 06/12/2018 07:07  Dg Chest Port 1 View  Result Date: 06/09/2018 CLINICAL DATA:  Hypoxia EXAM: PORTABLE CHEST 1 VIEW COMPARISON:  June 08, 2018 FINDINGS: Endotracheal tube tip is 3.1 cm above the carina. Central catheter tip is in the superior vena cava. Nasogastric tube tip and side port are below the diaphragm. No pneumothorax. There is persistent cardiomegaly with pulmonary vascular congestion. There are bilateral pleural effusions with extensive airspace opacity bilaterally, similar to 1 day prior. No new opacity evident. There is aortic atherosclerosis. There is calcification in the mitral annulus region. No bone lesions. IMPRESSION: Tube and catheter positions as described without pneumothorax. Widespread airspace opacity bilaterally, similar to 1 day prior. Suspect combination of alveolar edema and pneumonia. Small pleural effusions noted. Pulmonary vascular congestion present. Aortic Atherosclerosis (ICD10-I70.0). Electronically Signed   By: Lowella Grip III M.D.   On: 06/09/2018 08:37   Dg Chest Port 1 View  Result Date: 06/08/2018 CLINICAL DATA:  68 year old female status post gastric tube placement EXAM: PORTABLE CHEST 1 VIEW COMPARISON:  Prior chest x-ray 06/06/2018 FINDINGS: The tip of the endotracheal tube is 5.1 cm above the carina. A left subclavian central venous catheter is in place with the tip at the superior cavoatrial junction. A gastric tube is present. The tip lies below the field of view, within the stomach (as seen on recent prior imaging). Stable cardiomegaly. Increased perihilar and bibasilar interstitial and confluent airspace opacities. Probable small bilateral layering pleural effusions. Background of diffuse interstitial prominence similar compared to prior. No  pneumothorax. No acute osseous abnormality. IMPRESSION: 1. Worsening bilateral perihilar and bibasilar airspace disease concerning for progressive pulmonary edema superimposed on underlying viral pneumonia. 2. Stable cardiomegaly. 3. Stable and satisfactory support apparatus. Electronically Signed   By: Jacqulynn Cadet M.D.   On: 06/08/2018 15:28   Dg Chest Port 1 View  Result Date: 06/06/2018 CLINICAL DATA:  Acute respiratory failure EXAM: PORTABLE CHEST 1 VIEW COMPARISON:  06/04/2018 and prior radiographs FINDINGS: Cardiomediastinal silhouette is unchanged. An endotracheal tube, LEFT PICC line and NG tube are unchanged. Bilateral airspace opacities are stable from the prior study. Bilateral pleural effusions again noted.  No pneumothorax. IMPRESSION: Unchanged appearance of the chest with bilateral airspace opacities and bilateral pleural effusions. Electronically Signed   By: Margarette Canada M.D.   On: 06/06/2018 13:21   Dg Chest Port 1 View  Result Date: 06/04/2018 CLINICAL DATA:  Shortness of breath. EXAM: PORTABLE CHEST 1 VIEW COMPARISON:  06/02/2018 FINDINGS: Endotracheal tube has tip 3.6 cm above the carina. Enteric tube courses into the region of the stomach and off the film as tip not visualized. Left-sided PICC line unchanged. Lungs are adequately inflated demonstrate hazy bilateral perihilar opacification with slight interval worsening over the right upper perihilar region. Findings may be due to interstitial edema versus infection. Stable bibasilar opacification likely small effusions with atelectasis. Stable cardiomegaly. Remainder the exam is unchanged. IMPRESSION: Hazy bilateral perihilar opacification with interval worsening over the right upper perihilar region. Findings may be due to interstitial edema versus infection. Stable bibasilar opacification likely small effusions with atelectasis. Stable cardiomegaly. Tubes and lines as described. Electronically Signed   By: Marin Olp M.D.    On: 06/04/2018 09:04   Dg Chest Port 1 View  Result Date: 06/02/2018 CLINICAL DATA:  Short of breath EXAM: PORTABLE CHEST 1 VIEW COMPARISON:  05/30/2018 FINDINGS: Endotracheal and NG tubes are stable. Left PICC placed. Tip is at the cavoatrial junction. Diffuse airspace disease and bilateral pleural effusions are stable. Cardiac silhouette is  prominent likely due to AP technique. No pneumothorax. IMPRESSION: Stable diffuse bilateral airspace disease and bilateral pleural effusions. Left PICC placed.  Tip is at the cavoatrial junction. Electronically Signed   By: Marybelle Killings M.D.   On: 06/02/2018 07:40   Dg Chest Port 1 View  Result Date: 05/30/2018 CLINICAL DATA:  ARDS EXAM: PORTABLE CHEST 1 VIEW COMPARISON:  05/28/2018 FINDINGS: Endotracheal tube in good position.  NG tube in the stomach. Cardiac enlargement. Extensive diffuse bilateral airspace disease unchanged. Bilateral pleural effusions and bibasilar atelectasis unchanged. IMPRESSION: Diffuse bilateral airspace disease and bilateral effusions unchanged. Electronically Signed   By: Franchot Gallo M.D.   On: 05/30/2018 08:14   Dg Chest Port 1 View  Result Date: 05/28/2018 CLINICAL DATA:  68 year old female with ARDS EXAM: PORTABLE CHEST 1 VIEW COMPARISON:  Multiple prior most recent 05/27/2018, 05/23/2018, CT 06/05/2018 FINDINGS: Cardiomediastinal silhouette unchanged in size and contour, with cardiomegaly. Endotracheal tube unchanged terminating 2.7 cm above the carina. Unchanged gastric tube terminating out of the field of view. Low lung volumes with veil opacities at the bilateral lung bases obscuring the hemidiaphragm. Patchy airspace and interstitial opacities bilaterally, similar to the comparison. No pneumothorax. IMPRESSION: Unchanged endotracheal tube and gastric tube. Similar appearance of mixed interstitial and airspace opacities of the bilateral lungs, with bibasilar pleural effusions. Electronically Signed   By: Corrie Mckusick D.O.   On:  05/28/2018 07:53   Dg Chest Port 1 View  Result Date: 05/27/2018 CLINICAL DATA:  68 year old female with respiratory failure EXAM: PORTABLE CHEST 1 VIEW COMPARISON:  05/23/2018, 06/16/2018, 05/19/2018 FINDINGS: Cardiomediastinal silhouette unchanged in size and contour. Endotracheal tube terminates 2.7 cm above the carina. Unchanged gastric tube, with the tip terminating just below the diaphragm. No pneumothorax. Worsening airspace opacity in the left lung and right mid lung with veil opacity at the lung bases obscuring the hemidiaphragms and heart borders. Interval removal of the defibrillator pads. IMPRESSION: Unchanged endotracheal tube and gastric tube. Persisting interstitial and airspace opacity of the lungs, worsening on the left. Small bilateral pleural effusions and associated atelectasis/consolidation. Electronically Signed   By: Corrie Mckusick D.O.   On: 05/27/2018 08:02   Dg Chest Port 1 View  Result Date: 05/23/2018 CLINICAL DATA:  Intubation EXAM: PORTABLE CHEST 1 VIEW COMPARISON:  06/12/2018 FINDINGS: Endotracheal tube tip is 3 cm above the carina. Orogastric or nasogastric tube enters the abdomen. The tip may be just beyond the gastroesophageal junction. Widespread bilateral pulmonary infiltrates persist. No worsening or new finding by radiography. IMPRESSION: Endotracheal tube 3 cm above the carina. Orogastric or nasogastric tube enters the abdomen, just beyond the gastroesophageal junction. Widespread bilateral infiltrates persist. Electronically Signed   By: Nelson Chimes M.D.   On: 05/23/2018 21:38   Dg Chest Port 1 View  Result Date: 06/05/2018 CLINICAL DATA:  Shortness of breath since yesterday. Tachycardia. Former smoker. EXAM: PORTABLE CHEST 1 VIEW COMPARISON:  05/19/2018 FINDINGS: Lungs are adequately inflated and demonstrate worsening of bilateral multifocal airspace opacification which is most prominent over the right upper lobe and left perihilar region. Possible small amount left  pleural fluid unchanged. Mild stable cardiomegaly. Remainder the exam is unchanged. IMPRESSION: Interval worsening bilateral multifocal airspace process most prominent over the right upper lobe and left perihilar region likely multifocal pneumonia. Small amount left pleural fluid unchanged. Mild stable cardiomegaly. Electronically Signed   By: Marin Olp M.D.   On: 06/07/2018 14:29   Dg Chest Port 1 View  Result Date: 05/19/2018 CLINICAL DATA:  Respiratory failure  and congestive heart failure. EXAM: PORTABLE CHEST 1 VIEW COMPARISON:  05/18/2018 FINDINGS: Stable cardiac enlargement. Slight diminishment in CHF since the prior study. There is some asymmetric vascular congestion versus atelectasis in the right upper lung. Stable left basilar atelectasis and probable component of small to moderate left pleural effusion. IMPRESSION: Decreased prominence of CHF since the prior chest x-ray. Asymmetric vascular congestion versus atelectasis in the right upper lung. Left basilar atelectasis with probable component of left pleural fluid. Electronically Signed   By: Aletta Edouard M.D.   On: 05/19/2018 08:27   Dg Chest Port 1 View  Result Date: 05/18/2018 CLINICAL DATA:  Short of breath EXAM: PORTABLE CHEST 1 VIEW COMPARISON:  05/17/2018 FINDINGS: Stable enlarged cardiac silhouette. Bilateral pleural effusions similar prior. A central venous congestion. No focal consolidation. No pneumothorax. IMPRESSION: Cardiomegaly with small bilateral pleural effusions. Concern for mild congestive heart failure Electronically Signed   By: Suzy Bouchard M.D.   On: 05/18/2018 08:16   Dg Chest Port 1 View  Result Date: 05/17/2018 CLINICAL DATA:  Shortness of breath EXAM: PORTABLE CHEST 1 VIEW COMPARISON:  05/16/2018 FINDINGS: Cardiomegaly with mild perihilar edema and small bilateral pleural effusions, grossly unchanged. No pneumothorax. IMPRESSION: Cardiomegaly mild interstitial edema and small bilateral pleural effusions,  grossly unchanged. Electronically Signed   By: Julian Hy M.D.   On: 05/17/2018 08:11   Dg Abd Portable 1v  Result Date: 06/13/2018 CLINICAL DATA:  Evaluate OG tube EXAM: PORTABLE ABDOMEN - 1 VIEW COMPARISON:  June 13, 2018 FINDINGS: The OG tube still terminates below today's film in the right lower abdomen. I suspect the tube is within the duodenum. A left-sided pleural effusion with underlying opacity remains. No other acute abnormalities. IMPRESSION: 1. The OG tube terminates below today's film, in the lower right abdomen. The tube is thought to terminate within the duodenum. Recommend further withdrawal of 8 cm. 2. Effusion and underlying opacity in left base. Electronically Signed   By: Dorise Bullion III M.D   On: 06/13/2018 07:59   Dg Abd Portable 1v  Result Date: 06/13/2018 CLINICAL DATA:  Evaluate OG tube placement EXAM: PORTABLE ABDOMEN - 1 VIEW COMPARISON:  None. FINDINGS: The OG tube terminates below today's film, likely in the duodenum. IMPRESSION: The OG tube terminates below today's film and appears to exit the stomach into the duodenum. Recommend repositioning with withdrawal of at least 8 cm. Electronically Signed   By: Dorise Bullion III M.D   On: 06/13/2018 05:20   Dg Abd Portable 1v  Result Date: 06/08/2018 CLINICAL DATA:  Orogastric tube placement. EXAM: PORTABLE ABDOMEN - 1 VIEW COMPARISON:  Radiograph of June 06, 2018. FINDINGS: The bowel gas pattern is normal. Distal tip of orogastric tube is seen in expected position of distal stomach. No radio-opaque calculi or other significant radiographic abnormality are seen. IMPRESSION: Distal tip of orogastric tube seen in expected position of distal stomach. Electronically Signed   By: Marijo Conception M.D.   On: 06/08/2018 15:01   Vas [place] Lower Extremity Venous (dvt)  Result Date: 06/06/2018  Lower Venous Study Indications: Swelling, and Elevated d-Dimer and fever. Other Indications: Positive Covid-19. Limitations: Body  habitus and poor ultrasound/tissue interface. Performing Technologist: Oda Cogan RDMS, RVT  Examination Guidelines: A complete evaluation includes B-mode imaging, spectral Doppler, color Doppler, and power Doppler as needed of all accessible portions of each vessel. Bilateral testing is considered an integral part of a complete examination. Limited examinations for reoccurring indications may be performed as noted.  +---------+---------------+---------+-----------+----------+--------------+  RIGHT     Compressibility Phasicity Spontaneity Properties Summary         +---------+---------------+---------+-----------+----------+--------------+  CFV       Full            Yes       Yes                                    +---------+---------------+---------+-----------+----------+--------------+  SFJ       Full                                                             +---------+---------------+---------+-----------+----------+--------------+  FV Prox   Full                                                             +---------+---------------+---------+-----------+----------+--------------+  FV Mid    Full                                                             +---------+---------------+---------+-----------+----------+--------------+  FV Distal Full                                                             +---------+---------------+---------+-----------+----------+--------------+  PFV       Full                                                             +---------+---------------+---------+-----------+----------+--------------+  POP       Full                                                             +---------+---------------+---------+-----------+----------+--------------+  PTV                                                        Not visualized  +---------+---------------+---------+-----------+----------+--------------+  PERO  Not visualized   +---------+---------------+---------+-----------+----------+--------------+ Calf veins were poorly visualized cannot exclude DVT in this area.  +---------+---------------+---------+-----------+----------+--------------+  LEFT      Compressibility Phasicity Spontaneity Properties Summary         +---------+---------------+---------+-----------+----------+--------------+  CFV       Full            Yes       Yes                                    +---------+---------------+---------+-----------+----------+--------------+  SFJ       Full                                                             +---------+---------------+---------+-----------+----------+--------------+  FV Prox   Full                                                             +---------+---------------+---------+-----------+----------+--------------+  FV Mid    Full                                                             +---------+---------------+---------+-----------+----------+--------------+  FV Distal Full                                                             +---------+---------------+---------+-----------+----------+--------------+  PFV       Full                                                             +---------+---------------+---------+-----------+----------+--------------+  POP       Full                                                             +---------+---------------+---------+-----------+----------+--------------+  PTV                                                        Not visualized  +---------+---------------+---------+-----------+----------+--------------+  PERO  Not visualized  +---------+---------------+---------+-----------+----------+--------------+ Calf veins were poorly visualized cannot exclude DVT in this area.  Left Technical Findings: Test performed at [facility].   Summary: Right: There is no evidence of deep vein thrombosis in the lower  extremity. However, portions of this examination were limited- see technologist comments above. Left: There is no evidence of deep vein thrombosis in the lower extremity. However, portions of this examination were limited- see technologist comments above.  *See table(s) above for measurements and observations. Electronically signed by Servando Snare MD on 06/06/2018 at 10:26:42 AM.    Final    [place] Ekg Site Rite  Result Date: 05/30/2018 If Site Rite image not attached, placement could not be confirmed due to current cardiac rhythm.  [place] Ekg Site Rite  Result Date: 05/30/2018 If Site Rite image not attached, placement could not be confirmed due to current cardiac rhythm.   Microbiology: Recent Results (from the past 240 hour(s))  Culture, blood (Routine X 2) w Reflex to ID Panel     Status: None   Collection Time: 06/06/18  1:43 PM  Result Value Ref Range Status   Specimen Description   Final    BLOOD RIGHT HAND Performed at [facility], [place] [address], [place], [place] [address]    Special Requests   Final    BOTTLES DRAWN AEROBIC ONLY Blood Culture results may not be optimal due to an inadequate volume of blood received in culture bottles Performed at [facility] [address], [place], [place] [address]    Culture   Final    NO GROWTH 5 DAYS Performed at [facility], [place] [address], [place], [place] [address]    Report Status 06/11/2018 FINAL  Final  Culture, blood (Routine X 2) w Reflex to ID Panel     Status: None   Collection Time: 06/06/18  1:48 PM  Result Value Ref Range Status   Specimen Description   Final    BLOOD A-LINE Performed at [facility], [place] [address] [place], [place] [address]    Special Requests   Final    BOTTLES DRAWN AEROBIC ONLY Blood Culture adequate volume Performed at [place] [address], [place], [place] [address]    Culture   Final    NO GROWTH 5 DAYS Performed at  [facility], [place] [address], [place], [place] [address]    Report Status 06/11/2018 FINAL  Final     Labs: Basic Metabolic Panel: Recent Labs  Lab 06/10/18 0500 06/11/18 0500 06/12/18 0135 06/13/18 0320 06/14/18 0157 Jun 16, 2018 0219  NA 144 142 142 142 144 144  K 3.3* 3.8 3.5 3.5 3.0* 3.5  CL 110 109 107 106 109 109  CO2 26 27 27 29 29 28   GLUCOSE 113* 115* 136* 126* 132* 102*  BUN 33* 31* 28* 21 23 27*  CREATININE 0.93 0.90 0.92 0.93 0.88 0.87  CALCIUM 7.4* 7.4* 7.2* 7.5* 7.3* 7.4*  MG 2.5* 2.4 2.2 2.2  --  2.2  PHOS  --   --  3.5  --   --   --    Liver Function Tests: No results for input(s): AST, ALT, ALKPHOS, BILITOT, PROT, ALBUMIN in the last 168 hours. No results for input(s): LIPASE, AMYLASE in the last 168 hours. No results for input(s): AMMONIA in the last 168 hours. CBC: Recent Labs  Lab 06/11/18 0500 06/12/18 0135 06/13/18 0320 06/14/18 0157 June 16, 2018 0219  WBC  10.7* 6.9 7.6 7.0 6.4  HGB 8.1* 7.7* 8.2* 7.4* 7.6*  HCT 28.7* 27.6* 29.0* 26.1* 27.2*  MCV 92.9 92.0 92.9 92.2 93.8  PLT 251 229 252 236 239   Cardiac Enzymes: No results for input(s): CKTOTAL, CKMB, CKMBINDEX, TROPONINI in the last 168 hours. D-Dimer No results for input(s): DDIMER in the last 72 hours. BNP: Invalid input(s): POCBNP CBG: Recent Labs  Lab 06/14/18 2015 06/29/18 0030 06-29-18 0410 06-29-2018 0752 06/29/2018 1129  GLUCAP 131* 122* 110* 90 77   Anemia work up No results for input(s): VITAMINB12, FOLATE, FERRITIN, TIBC, IRON, RETICCTPCT in the last 72 hours. Urinalysis    Component Value Date/Time   COLORURINE YELLOW 06/03/2018 1347   APPEARANCEUR CLEAR 06/03/2018 1347   APPEARANCEUR Cloudy (A) 03/04/2018 1123   LABSPEC 1.019 06/03/2018 1347   PHURINE 8.0 06/03/2018 1347   GLUCOSEU NEGATIVE 06/03/2018 1347   HGBUR NEGATIVE 06/03/2018 [place] 06/03/2018 1347   BILIRUBINUR Negative 03/04/2018 [place] 06/03/2018 1347    PROTEINUR 30 (A) 06/03/2018 1347   NITRITE NEGATIVE 06/03/2018 1347   LEUKOCYTESUR NEGATIVE 06/03/2018 1347   Sepsis Labs Invalid input(s): PROCALCITONIN,  WBC,  LACTICIDVEN     SIGNED:  Kathie Dike, MD  [facility] 2018-06-29, 4:15 PM Pager   If 7PM-7AM, please contact night-coverage [email or web address] Password [email or web address]

## 2018-06-19 NOTE — Progress Notes (Signed)
Pt time of death 57 pronouced by 2 RNs as listed in Epic flowsheet. This RN notified primary contact, Shannon Carroll who verbalized that he would contact other family members including pt spouse. This RN also notified attending, Dr. Jolaine Artist. Pt has a ring on left hand that could not be removed easily due to swelling. This RN left the ring on pt finger and completed post mortem care.

## 2018-06-19 DEATH — deceased

## 2018-06-23 ENCOUNTER — Ambulatory Visit: Payer: Medicare Other | Admitting: Podiatry

## 2020-04-29 IMAGING — DX PORTABLE CHEST - 1 VIEW
1 series · 1 of 1 positions shown · non-contrast
Comparison: 05/16/2018

CLINICAL DATA: Shortness of breath

EXAM:
PORTABLE CHEST 1 VIEW

[chest ap]
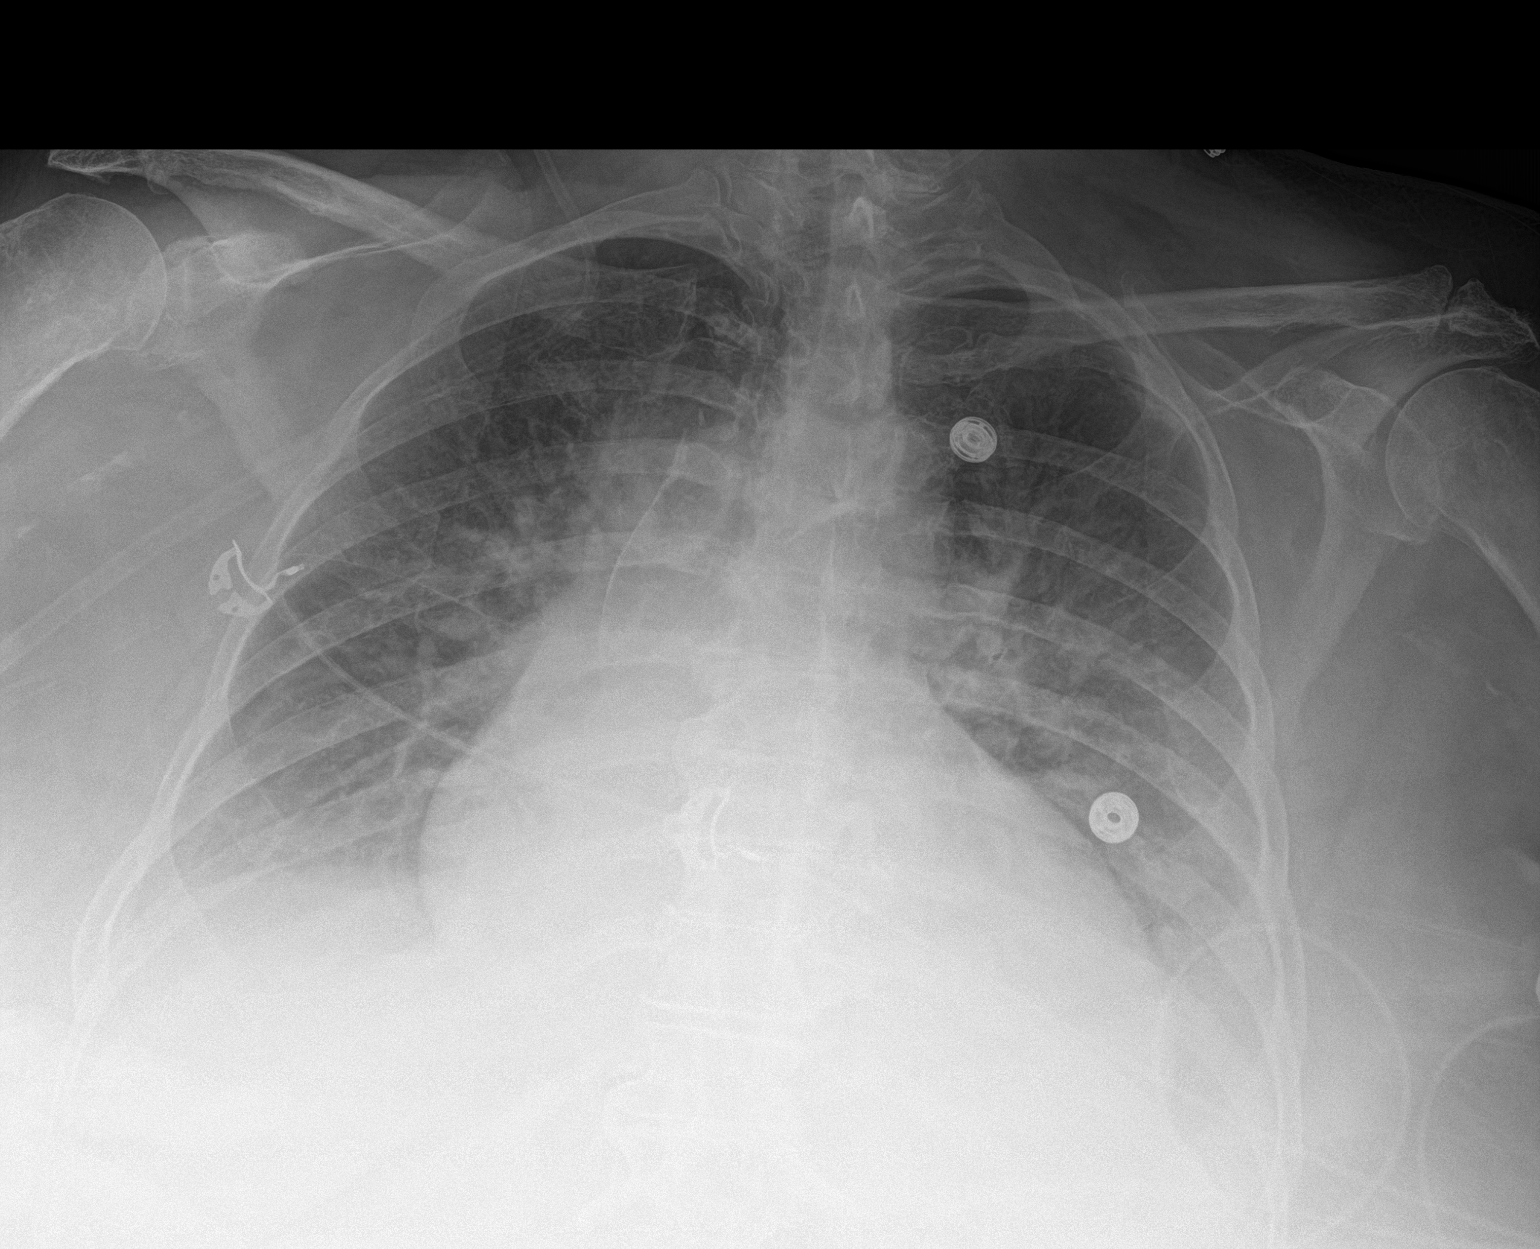

[1 of 1 positions shown; findings below may reference images not displayed]

FINDINGS: Cardiomegaly with mild perihilar edema and small bilateral pleural
effusions, grossly unchanged. No pneumothorax.
IMPRESSION: Cardiomegaly mild interstitial edema and small bilateral pleural
effusions, grossly unchanged.

## 2020-04-30 IMAGING — DX PORTABLE CHEST - 1 VIEW
1 series · 1 of 1 positions shown · non-contrast
Comparison: 05/17/2018

CLINICAL DATA: Short of breath

EXAM:
PORTABLE CHEST 1 VIEW

[chest]
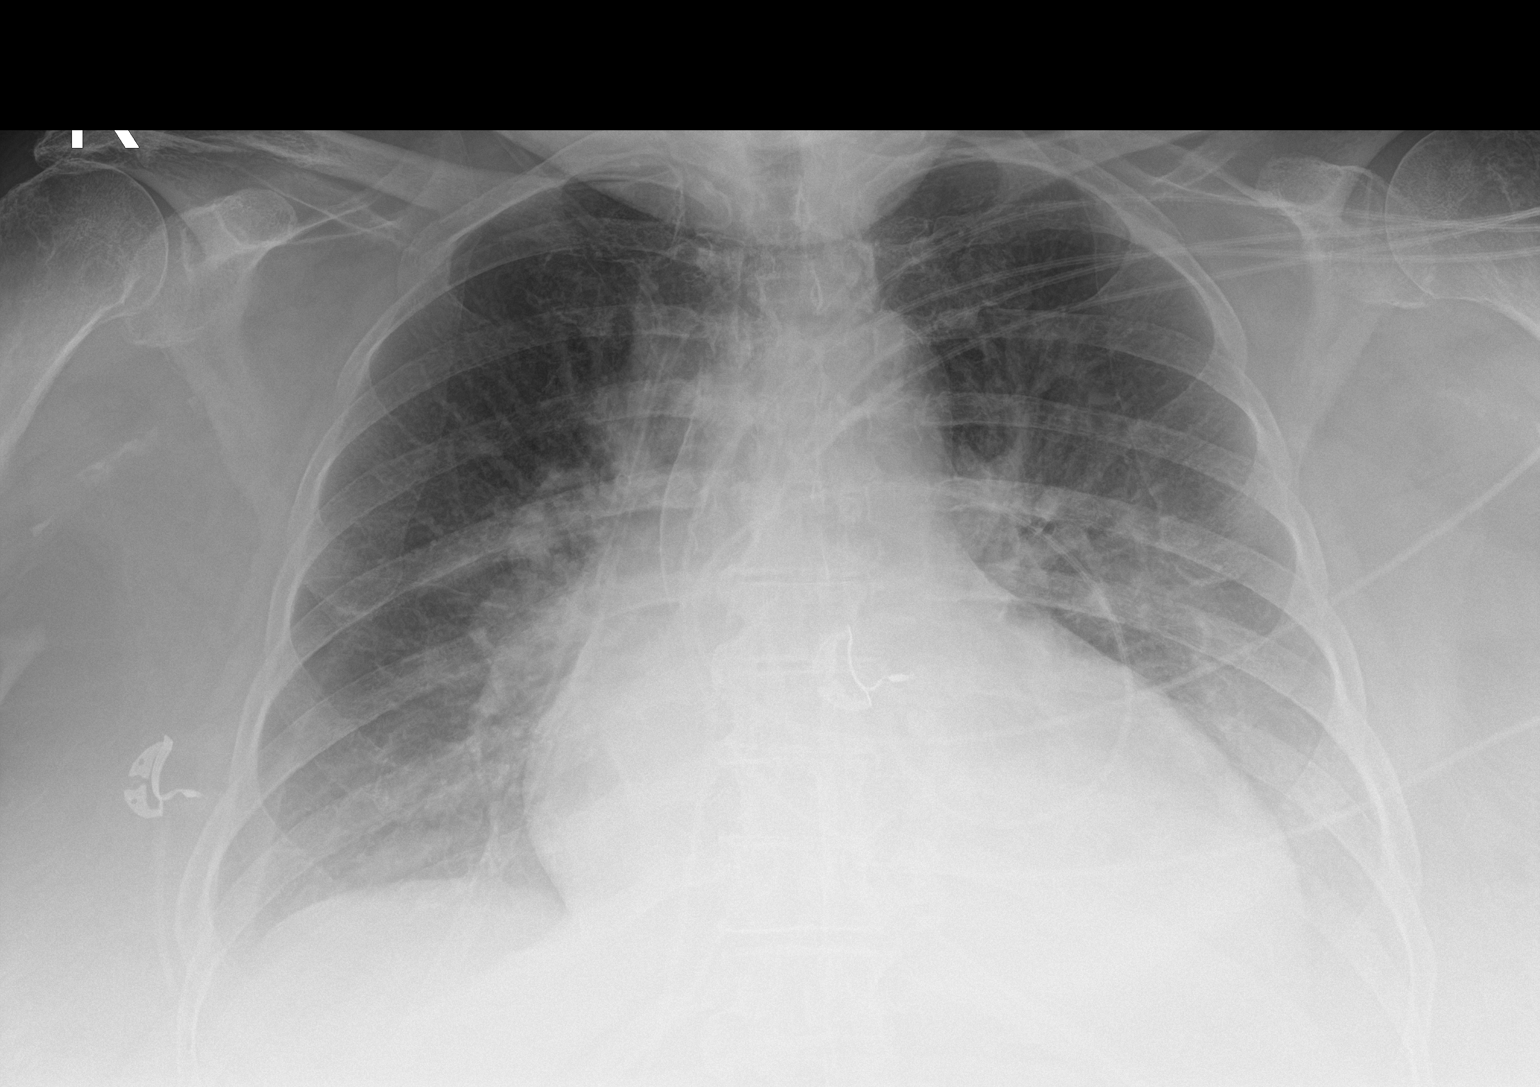

[1 of 1 positions shown; findings below may reference images not displayed]

FINDINGS: Stable enlarged cardiac silhouette. Bilateral pleural effusions
similar prior. A central venous congestion. No focal consolidation.
No pneumothorax.
IMPRESSION: Cardiomegaly with small bilateral pleural effusions. Concern for
mild congestive heart failure

## 2020-05-01 IMAGING — DX PORTABLE CHEST - 1 VIEW
1 series · 1 of 1 positions shown · non-contrast
Comparison: 05/18/2018

CLINICAL DATA: Respiratory failure and congestive heart failure.

EXAM:
PORTABLE CHEST 1 VIEW

[chest ap]
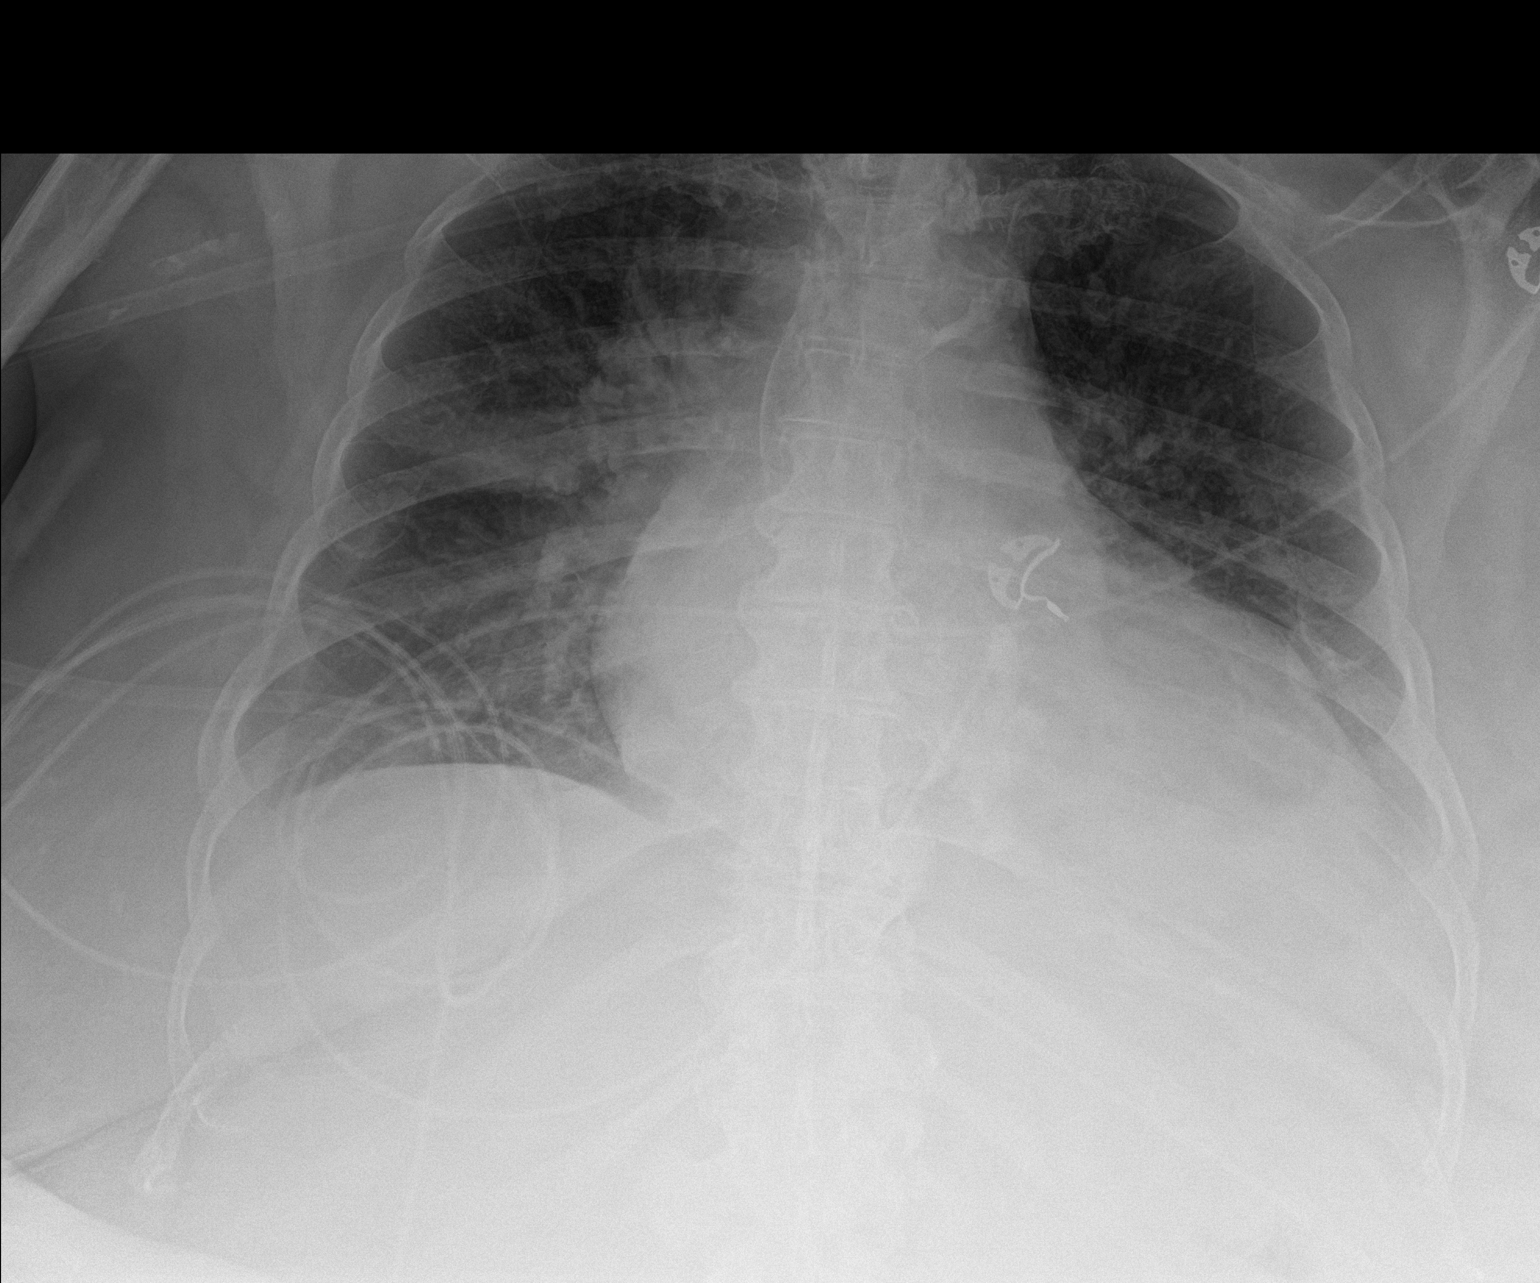

[1 of 1 positions shown; findings below may reference images not displayed]

FINDINGS: Stable cardiac enlargement. Slight diminishment in CHF since the
prior study. There is some asymmetric vascular congestion versus
atelectasis in the right upper lung. Stable left basilar atelectasis
and probable component of small to moderate left pleural effusion.
IMPRESSION: Decreased prominence of CHF since the prior chest x-ray. Asymmetric
vascular congestion versus atelectasis in the right upper lung. Left
basilar atelectasis with probable component of left pleural fluid.

## 2020-05-04 IMAGING — DX PORTABLE CHEST - 1 VIEW
1 series · 1 of 1 positions shown · non-contrast
Comparison: 05/19/2018

CLINICAL DATA: Shortness of breath since yesterday. Tachycardia.
Former smoker.

EXAM:
PORTABLE CHEST 1 VIEW

[chest]
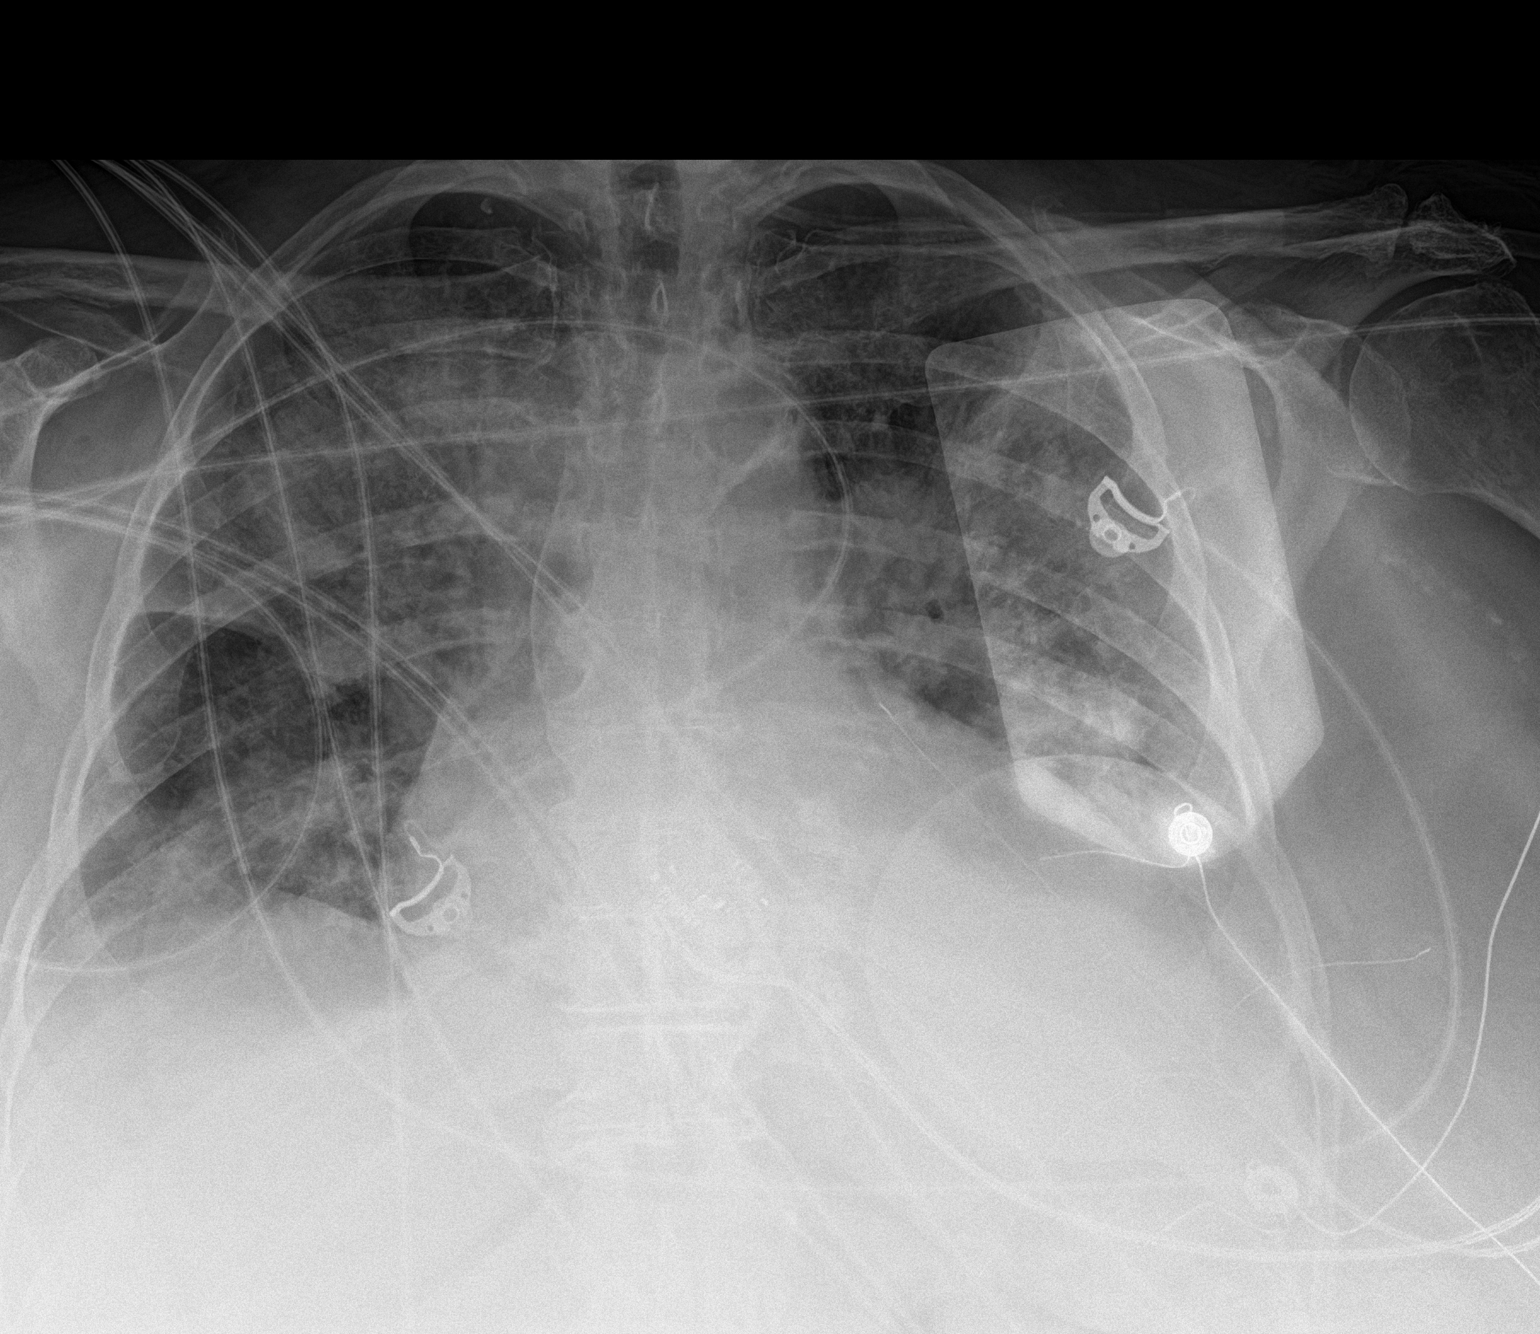

[1 of 1 positions shown; findings below may reference images not displayed]

FINDINGS: Lungs are adequately inflated and demonstrate worsening of bilateral
multifocal airspace opacification which is most prominent over the
right upper lobe and left perihilar region. Possible small amount
left pleural fluid unchanged. Mild stable cardiomegaly. Remainder
the exam is unchanged.
IMPRESSION: Interval worsening bilateral multifocal airspace process most
prominent over the right upper lobe and left perihilar region likely
multifocal pneumonia. Small amount left pleural fluid unchanged.

Mild stable cardiomegaly.

## 2020-05-05 IMAGING — DX PORTABLE CHEST - 1 VIEW
1 series · 2 of 2 positions shown · non-contrast
Comparison: 05/22/2018

CLINICAL DATA: Intubation

EXAM:
PORTABLE CHEST 1 VIEW

[Series 1: chest · 0.14mm/px · 2 of 2 slices shown]
[im 1/2]
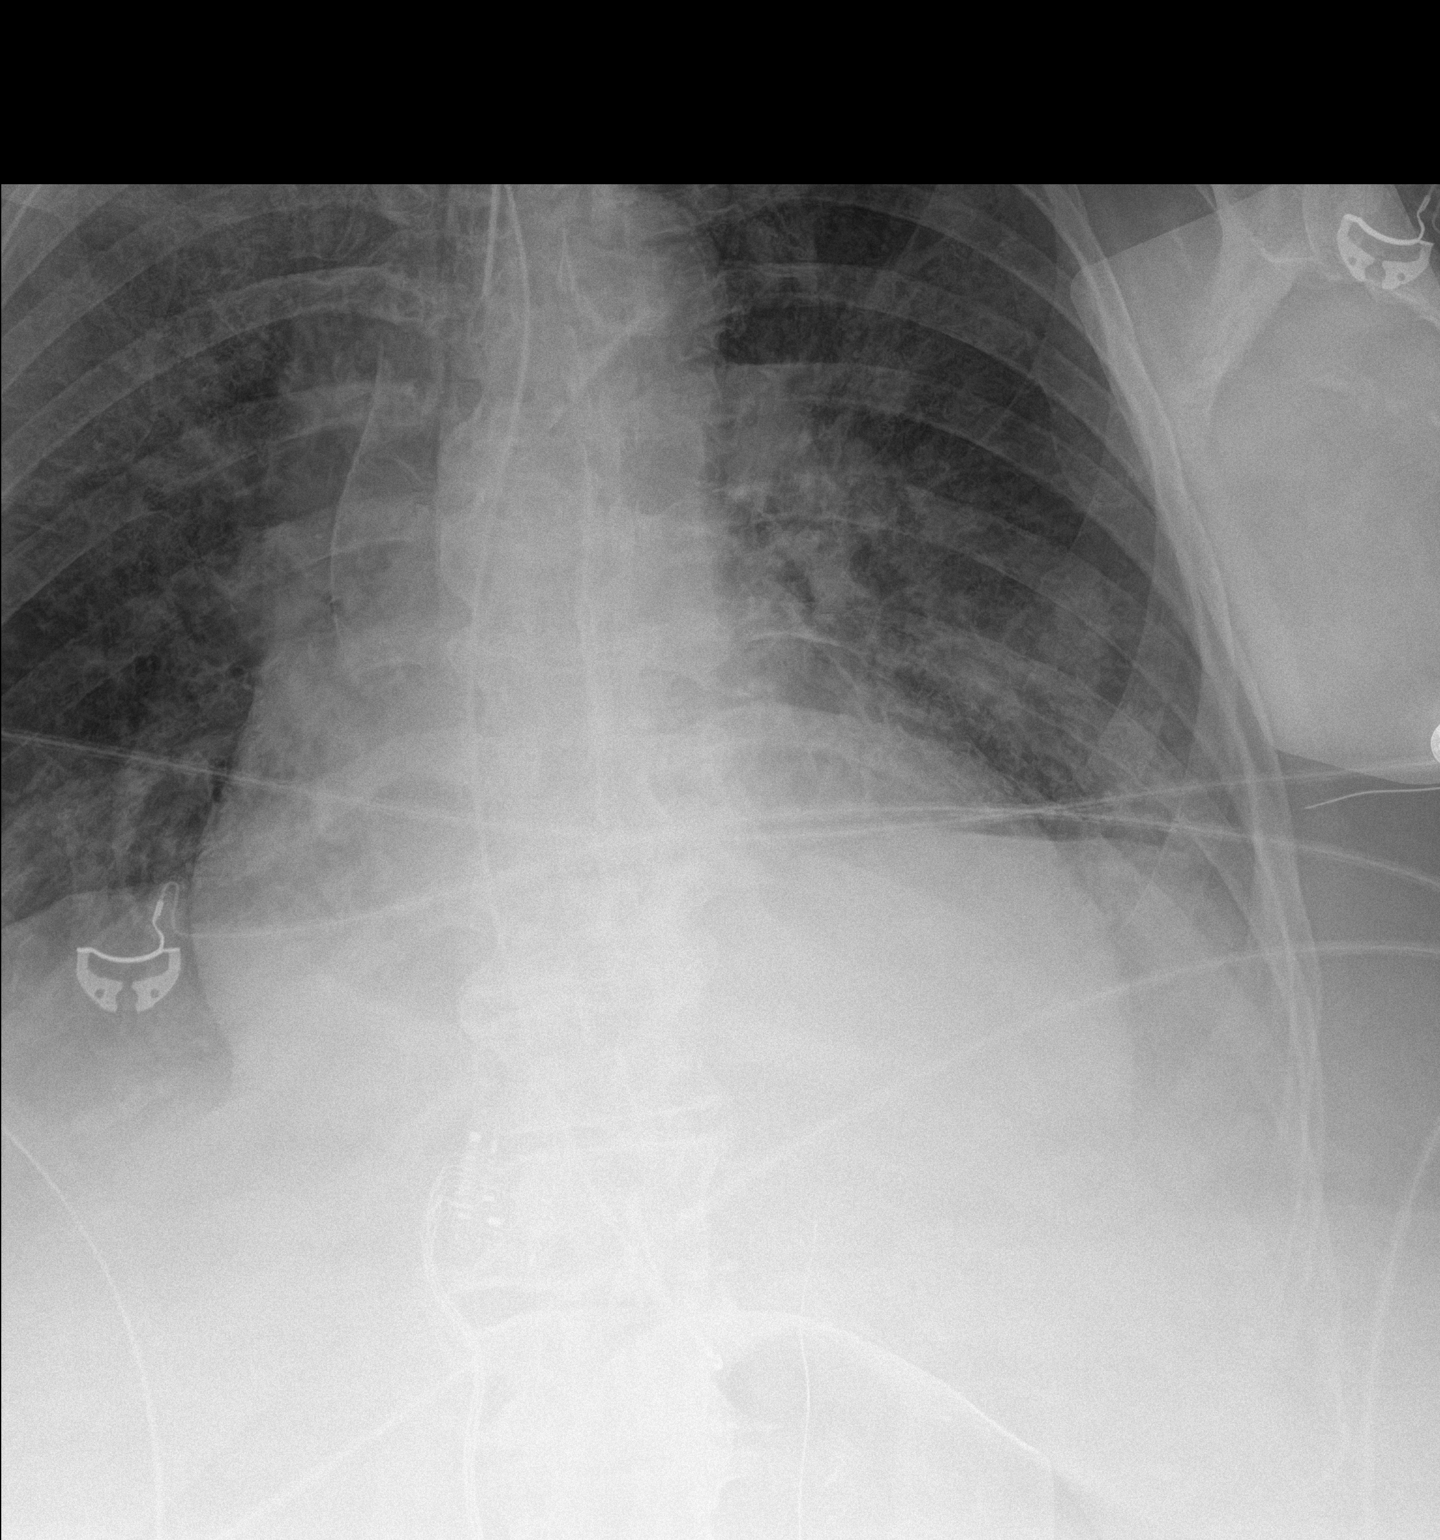
[im 2/2]
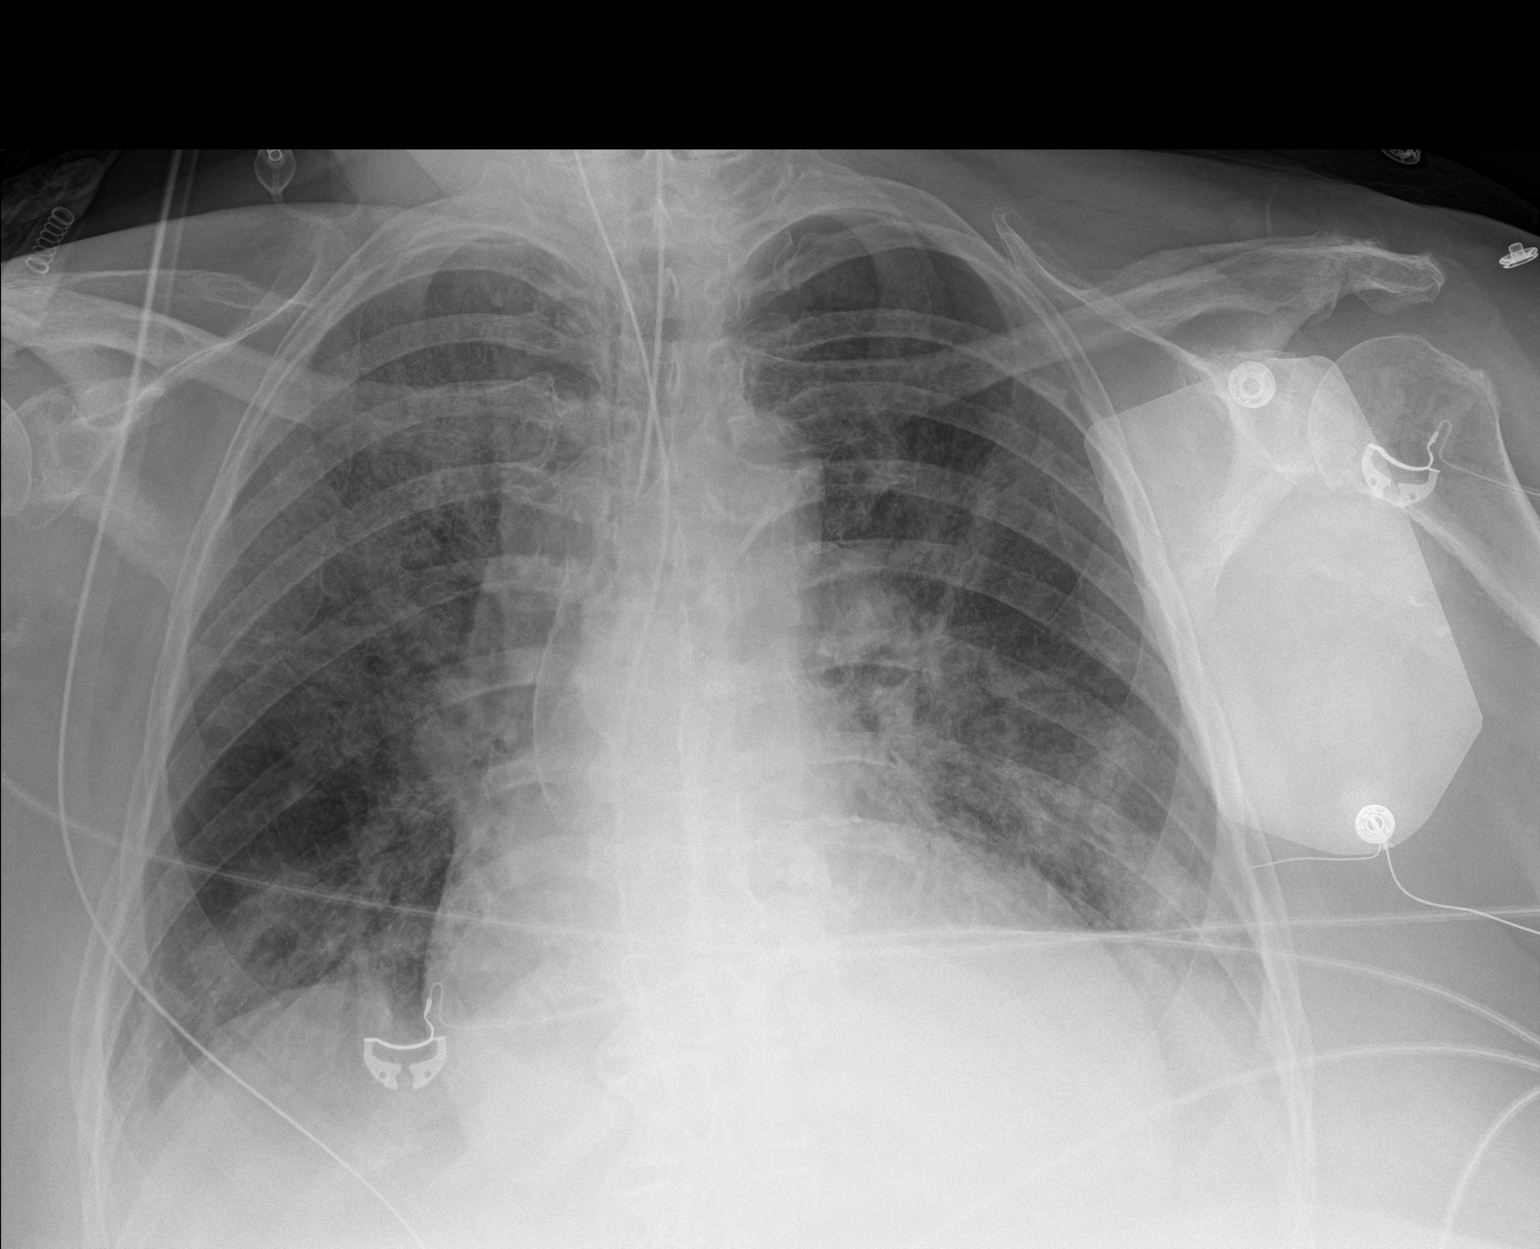

[2 of 2 positions shown; findings below may reference images not displayed]

FINDINGS: Endotracheal tube tip is 3 cm above the carina. Orogastric or
nasogastric tube enters the abdomen. The tip may be just beyond the
gastroesophageal junction. Widespread bilateral pulmonary
infiltrates persist. No worsening or new finding by radiography.
IMPRESSION: Endotracheal tube 3 cm above the carina. Orogastric or nasogastric
tube enters the abdomen, just beyond the gastroesophageal junction.
Widespread bilateral infiltrates persist.
